# Patient Record
Sex: Female | Born: 1955 | Race: White | Hispanic: No | Smoking: Former smoker
Health system: Southern US, Community
[De-identification: ages and names within clinical notes are randomized; demographics above are authoritative.]

## PROBLEM LIST (undated history)

## (undated) DIAGNOSIS — T7840XA Allergy, unspecified, initial encounter: Secondary | ICD-10-CM

## (undated) DIAGNOSIS — E559 Vitamin D deficiency, unspecified: Secondary | ICD-10-CM

## (undated) DIAGNOSIS — G4700 Insomnia, unspecified: Secondary | ICD-10-CM

## (undated) DIAGNOSIS — K219 Gastro-esophageal reflux disease without esophagitis: Secondary | ICD-10-CM

## (undated) DIAGNOSIS — G35 Multiple sclerosis: Secondary | ICD-10-CM

## (undated) DIAGNOSIS — E7800 Pure hypercholesterolemia, unspecified: Secondary | ICD-10-CM

## (undated) DIAGNOSIS — I509 Heart failure, unspecified: Secondary | ICD-10-CM

## (undated) DIAGNOSIS — K5792 Diverticulitis of intestine, part unspecified, without perforation or abscess without bleeding: Secondary | ICD-10-CM

## (undated) DIAGNOSIS — I4891 Unspecified atrial fibrillation: Secondary | ICD-10-CM

## (undated) DIAGNOSIS — I10 Essential (primary) hypertension: Secondary | ICD-10-CM

## (undated) HISTORY — DX: Essential (primary) hypertension: I10

## (undated) HISTORY — DX: Insomnia, unspecified: G47.00

## (undated) HISTORY — DX: Unspecified atrial fibrillation: I48.91

## (undated) HISTORY — DX: Vitamin D deficiency, unspecified: E55.9

## (undated) HISTORY — DX: Diverticulitis of intestine, part unspecified, without perforation or abscess without bleeding: K57.92

## (undated) HISTORY — DX: Gastro-esophageal reflux disease without esophagitis: K21.9

## (undated) HISTORY — DX: Allergy, unspecified, initial encounter: T78.40XA

## (undated) HISTORY — DX: Heart failure, unspecified: I50.9

## (undated) HISTORY — DX: Multiple sclerosis: G35

## (undated) HISTORY — PX: WISDOM TOOTH EXTRACTION: SHX21

## (undated) HISTORY — PX: TONSILLECTOMY: SUR1361

---

## 1979-01-22 HISTORY — PX: WISDOM TOOTH EXTRACTION: SHX21

## 2006-04-15 ENCOUNTER — Emergency Department (HOSPITAL_COMMUNITY): Admission: EM | Admit: 2006-04-15 | Discharge: 2006-04-15 | Payer: Self-pay | Admitting: Emergency Medicine

## 2008-09-22 ENCOUNTER — Ambulatory Visit (HOSPITAL_COMMUNITY): Admission: RE | Admit: 2008-09-22 | Discharge: 2008-09-22 | Payer: Self-pay | Admitting: Family Medicine

## 2009-07-11 ENCOUNTER — Emergency Department (HOSPITAL_COMMUNITY): Admission: EM | Admit: 2009-07-11 | Discharge: 2009-07-11 | Payer: Self-pay | Admitting: Emergency Medicine

## 2009-07-24 ENCOUNTER — Telehealth: Payer: Self-pay | Admitting: Gastroenterology

## 2009-08-11 ENCOUNTER — Ambulatory Visit: Payer: Self-pay | Admitting: Gastroenterology

## 2009-08-11 DIAGNOSIS — K5732 Diverticulitis of large intestine without perforation or abscess without bleeding: Secondary | ICD-10-CM | POA: Insufficient documentation

## 2009-08-11 DIAGNOSIS — K589 Irritable bowel syndrome without diarrhea: Secondary | ICD-10-CM

## 2009-08-21 ENCOUNTER — Telehealth: Payer: Self-pay | Admitting: Gastroenterology

## 2009-08-21 ENCOUNTER — Ambulatory Visit (HOSPITAL_COMMUNITY): Admission: RE | Admit: 2009-08-21 | Discharge: 2009-08-21 | Payer: Self-pay | Admitting: Gastroenterology

## 2009-08-21 ENCOUNTER — Ambulatory Visit: Payer: Self-pay | Admitting: Gastroenterology

## 2009-08-21 HISTORY — PX: COLONOSCOPY: SHX174

## 2009-08-28 ENCOUNTER — Encounter (INDEPENDENT_AMBULATORY_CARE_PROVIDER_SITE_OTHER): Payer: Self-pay

## 2009-09-24 ENCOUNTER — Telehealth (INDEPENDENT_AMBULATORY_CARE_PROVIDER_SITE_OTHER): Payer: Self-pay | Admitting: *Deleted

## 2009-10-01 ENCOUNTER — Encounter: Payer: Self-pay | Admitting: Gastroenterology

## 2009-10-01 ENCOUNTER — Encounter (INDEPENDENT_AMBULATORY_CARE_PROVIDER_SITE_OTHER): Payer: Self-pay | Admitting: *Deleted

## 2009-10-13 ENCOUNTER — Encounter: Payer: Self-pay | Admitting: Gastroenterology

## 2009-10-22 ENCOUNTER — Emergency Department (HOSPITAL_COMMUNITY): Admission: EM | Admit: 2009-10-22 | Discharge: 2009-10-22 | Payer: Self-pay | Admitting: Emergency Medicine

## 2010-01-20 ENCOUNTER — Emergency Department (HOSPITAL_COMMUNITY): Admission: EM | Admit: 2010-01-20 | Discharge: 2010-01-21 | Payer: Self-pay | Admitting: Emergency Medicine

## 2010-04-12 ENCOUNTER — Ambulatory Visit (HOSPITAL_COMMUNITY): Admission: RE | Admit: 2010-04-12 | Discharge: 2010-04-12 | Payer: Self-pay | Admitting: Family Medicine

## 2010-06-13 ENCOUNTER — Encounter: Payer: Self-pay | Admitting: Family Medicine

## 2010-06-14 ENCOUNTER — Encounter: Payer: Self-pay | Admitting: Family Medicine

## 2010-06-22 NOTE — Assessment & Plan Note (Signed)
Summary: DIVERTICULITIS,COLON CANCER SCREENING, IBS   Visit Type:  Initial Consult Primary Care Provider:  Phillips Odor, M.D.  Chief Complaint:  hx of diverticulitis and consult for tcs.  History of Present Illness: Pt had first episode of diverticulitis and Rx: ABx-CIP/FLAG x 10 days. It helped. Presented with just pain. "Always had a weird stomach. Tons of IBS-sister, brother, father. Rectal urgency plus explosive diarrhea. usu. triggered by gteasy or spicy foods. No blood in her stool, weight loss, nausea, vomiting, indigestion, problems swallowing, or black tarry stool. Glass of Chardonnay may cause heartburn. Added Citrucel and made her have loose BMs for 1.5 days. BM: once daily, usu. formed/soft. Appetite never really lost it. No TCS ever.   Preventive Screening-Counseling & Management  Alcohol-Tobacco     Smoking Status: quit  Current Medications (verified): 1)  Avanex Injections .... Q Week 2)  Zoloft 50 Mg Tabs (Sertraline Hcl) .[name].. 11/2 Once Daily 3)  Benicar Hct 40-12.5 Mg Tabs (Olmesartan Medoxomil-Hctz) .... Once Daily 4)  Ambien 10 Mg Tabs (Zolpidem Tartrate) .... 1/2 At Bedtime 5)  Ibuprofen 800 Mg Tabs (Ibuprofen) .... As Needed 6)  Otc Allergy .... As Needed  Allergies (verified): 1)  ! Sulfa 2)  ! Codeine 3)  ! Penicillin  Past History:  Past Medical History: Multiple Sclerosis-pain Allergies Depression/Anxiety Hypertension Irritable Bowel Syndrome, DIARRHEA PREDOMINANT FEB 2011: SIGMOID COLON DIVERTICULITIS  Past Surgical History: Wisdom Teeth extracted 1980s  Family History: No FH of Colon Cancer  Polyps: father Family History of Breast Cancer: aunt No Family History of Ovarian Cancer: No Family History of Pancreatic Cancer: No Family History of Stomach Cancer: No Family History of Uterine Cancer:  Social History: Was in Advertising account executive for a newspaper. At [employer]. Divorced/single-no kids Patient is a former smoker. Quit  2005. Alcohol Use - yes, rare Smoking Status:  quit  Review of Systems       Per HPI, otherwise all systems negative AUG 2010: 158 LBS, TSH 3.198, HB 13.8 PLT 253 CR 0.78 AST 17 ALT 15 ALB 4.0 FEB 2011: WBC 14.4 o/w WNL, HFP: WNLs CT A/P w/ ivc-THICKENED SIGMOID COLON, DIVERTICULA IN DC/[place]  Vital Signs:  Patient profile:   55 year old female Height:      65 inches Weight:      164 pounds BMI:     27.39 Temp:     98.0 degrees F oral Pulse rate:   68 / minute BP sitting:   124 / 80  (left arm) Cuff size:   regular  Vitals Entered By: Hendricks Limes LPN (August 11, 2009 8:43 AM)  Physical Exam  General:  Well developed, well nourished, no acute distress. Head:  Normocephalic and atraumatic. Eyes:  PERRLA, no icterus. Mouth:  No deformity or lesions, dentition normal. Neck:  Supple; no masses. Lungs:  Clear throughout to auscultation. Heart:  Regular rate and rhythm; no murmurs, rubs,  or bruits. Abdomen:  Soft, nontender and nondistended. Normal bowel sounds. Neurologic:  Alert and  oriented x4;  grossly normal neurologically.  Impression & Recommendations:  Problem # 1:  DIVERTICULITIS, COLON (ICD-562.11) Assessment Improved  Sx resolved.   Orders: Consultation Level III (72536)  Problem # 2:  SCREENING, COLON CANCER (ICD-V76.51) Assessment: New Pt is average risk for developing colon CA. Never had a TCS. TCS APR 1. Pt requests Osmoprep. Explained <1% chance of renal failure. Pt encouraged to remain hydrated as indicated by urine that is light yellow. WILL CHECK CELIAC SEROLOGIES ON DAY OF TCS.  OPV as needed.  Problem # 3:  IRRITABLE BOWEL SYNDROME (ICD-564.1) Assessment: Unchanged WILL NEED CELIAC SEROLOGIES ON DAY OF TCS.  CC: PCP

## 2010-06-22 NOTE — Letter (Signed)
Summary: TCS ORDER  TCS ORDER   Imported By: Ave Filter 08/11/2009 09:30:23  _____________________________________________________________________  External Attachment:    Type:   Image     Comment:   External Document

## 2010-06-22 NOTE — Progress Notes (Signed)
Summary: RECTAL PAIN AFTER PREP  Pt c/o sore anus after prep. West Bali MD  August 21, 2009 12:20 PM      New/Updated Medications: PROCTOCREAM-HC 2.5 % CREA (HYDROCORTISONE) top qid for 5 days Prescriptions: PROCTOCREAM-HC 2.5 % CREA (HYDROCORTISONE) top qid for 5 days  #1 x 0   Entered and Authorized by:   West Bali MD   Signed by:   West Bali MD on 08/21/2009   Method used:   Electronically to        [facility] [address] [phone]* (retail)       [address], [place]  [address]       Ph: [phone]       Fax: [phone]   RxID:   [id number]

## 2010-06-22 NOTE — Progress Notes (Signed)
Summary: DIVERTICULITIS  Pt scheduled as a NPV due to work conflict. West Bali MD  July 24, 2009 1:48 PM

## 2010-06-22 NOTE — Letter (Signed)
Summary: Internal Other  Internal Other   Imported By: Peggyann Shoals 10/13/2009 13:58:55  _____________________________________________________________________  External Attachment:    Type:   Image     Comment:   External Document

## 2010-06-22 NOTE — Letter (Signed)
Summary: Normal Results Letter  [facility]  [address]   [place], [place] [address]   Phone: [phone]  Fax: [phone]    August 28, 2009  Krystal Silva [address] [place], [place]  [address] 10-Feb-1956   Dear [name],   Our office has been trying to contact you.  We just wanted to inform you that  your tests were normal, no celiac sprue. If you have any questions, please call the office at [phone].   Thank you,    Hendricks Limes, LPN Cloria Spring, LPN  [facility] Ph: [phone]   Fax: [phone]

## 2010-06-22 NOTE — Letter (Signed)
Summary: letter to resubmit screening TCS/MM  letter to resubmit screening TCS/MM   Imported By: Minna Merritts 10/01/2009 16:59:55  _____________________________________________________________________  External Attachment:    Type:   Image     Comment:   External Document

## 2010-06-22 NOTE — Progress Notes (Signed)
Summary: billing Q on TCS  LMOVM for pt to return my call.  Investigation shows multiple DXs entered on the office visit and the op rpt that do suggest screening.  Can speak with me when available.   ---- Converted from flag ---- ---- 09/24/2009 9:12 AM, Ave Filter wrote: Pt. came by and wanted to speak with you about a billing question..Her tcs was coded wrong it shoud have been coded as a screening.  She would like for you to give her a call. ------------------------------  Appended Document: billing Q on TCS Spoke with patient.  She is very upset that DX codes sent to insurance company do not reflect screening only reasons for proceeding with the test.  She states was extremely pain and used some explicatives to express how she would not have proceeded if she had known this.  I explained to her the protocol for how DX codes are determined by [facility] Billing and that is how it gets submitted to the insurance company.  She expressed that she was "dealing with [place]" and that we would not comment further on the situation and disconnected.

## 2010-06-22 NOTE — Letter (Signed)
Summary: Generic Letter, Intro to Referring  [facility]  [address]   [place], [place] [address]   Phone: [phone]  Fax: [phone]      Oct 01, 2009             RE: Krystal Silva   [birth year]                 [address]                 [place], [place]  [address]  Dear [name],  I am writing to you in regards to the recent letter you sent to our office to reconsider the claim billed to your insurance company for the procedure perfored on August 21, 2009.  I realize that Dr. Phillips Odor may have referred you to [place] for a colonoscopy based on your age and colon cancer screening guidelines.  However, when you visited the office, you had had an episode of diverticulitis with colonic wall thickening seen on your CT scan performed on July 11, 2009; and therefore, you did not meet the criteria for a screening colonoscopy.  Once evaluated in the office on August 11, 2009 by Jonette Eva, MD., the history of diverticulitis was discussed, as well as, the symptoms of rectal urgency and diarrhea related to irritable bowel syndrome.  Although Dr. Phillips Odor felt that you were due for a screening colonoscopy, Dr. Darrick Penna determined a colonoscopy was necessary to evaluate further possible explanations for the findings of the CT scan.  These diagnosises are what the billing department uses to submit a claim to your insurance company.  If we were to remove the symptoms from the office and operative report, we would be committing insurance fraud.  Thank you for allowing to participate in the care of your gastrointestinal needs.   Best regards,    Written onbehalf of Dr. Duncan Dull L. Fields  Minna Merritts, [facility] Ph: [phone]   Fax: [phone]  CC: Dr. Assunta Found

## 2010-08-06 LAB — DIFFERENTIAL
Basophils Absolute: 0 10*3/uL (ref 0.0–0.1)
Basophils Relative: 0 % (ref 0–1)
Monocytes Absolute: 0.9 10*3/uL (ref 0.1–1.0)
Neutro Abs: 8.6 10*3/uL — ABNORMAL HIGH (ref 1.7–7.7)
Neutrophils Relative %: 71 % (ref 43–77)

## 2010-08-06 LAB — URINALYSIS, ROUTINE W REFLEX MICROSCOPIC
Bilirubin Urine: NEGATIVE
Nitrite: NEGATIVE
Protein, ur: NEGATIVE mg/dL
Specific Gravity, Urine: 1.025 (ref 1.005–1.030)
Urobilinogen, UA: 0.2 mg/dL (ref 0.0–1.0)

## 2010-08-06 LAB — COMPREHENSIVE METABOLIC PANEL
Alkaline Phosphatase: 61 U/L (ref 39–117)
BUN: 11 mg/dL (ref 6–23)
Chloride: 102 mEq/L (ref 96–112)
Glucose, Bld: 106 mg/dL — ABNORMAL HIGH (ref 70–99)
Potassium: 3 mEq/L — ABNORMAL LOW (ref 3.5–5.1)
Total Bilirubin: 0.8 mg/dL (ref 0.3–1.2)

## 2010-08-06 LAB — CBC
HCT: 37.7 % (ref 36.0–46.0)
MCV: 89.9 fL (ref 78.0–100.0)
RBC: 4.19 MIL/uL (ref 3.87–5.11)
WBC: 12.1 10*3/uL — ABNORMAL HIGH (ref 4.0–10.5)

## 2010-08-09 LAB — URINE MICROSCOPIC-ADD ON

## 2010-08-09 LAB — URINALYSIS, ROUTINE W REFLEX MICROSCOPIC
Bilirubin Urine: NEGATIVE
Ketones, ur: NEGATIVE mg/dL
Protein, ur: NEGATIVE mg/dL
Urobilinogen, UA: 0.2 mg/dL (ref 0.0–1.0)

## 2010-08-11 LAB — PREGNANCY, URINE: Preg Test, Ur: NEGATIVE

## 2010-08-11 LAB — CBC
HCT: 36.7 % (ref 36.0–46.0)
Hemoglobin: 12.9 g/dL (ref 12.0–15.0)
MCV: 90.3 fL (ref 78.0–100.0)
RBC: 4.06 MIL/uL (ref 3.87–5.11)
WBC: 14.4 10*3/uL — ABNORMAL HIGH (ref 4.0–10.5)

## 2010-08-11 LAB — COMPREHENSIVE METABOLIC PANEL
Alkaline Phosphatase: 62 U/L (ref 39–117)
BUN: 10 mg/dL (ref 6–23)
CO2: 29 mEq/L (ref 19–32)
Chloride: 105 mEq/L (ref 96–112)
Creatinine, Ser: 0.85 mg/dL (ref 0.4–1.2)
GFR calc non Af Amer: >60 mL/min (ref >60)
Glucose, Bld: 109 mg/dL — ABNORMAL HIGH (ref 70–99)
Potassium: 3.3 mEq/L — ABNORMAL LOW (ref 3.5–5.1)
Total Bilirubin: 0.8 mg/dL (ref 0.3–1.2)

## 2010-08-11 LAB — URINALYSIS, ROUTINE W REFLEX MICROSCOPIC
Bilirubin Urine: NEGATIVE
Hgb urine dipstick: NEGATIVE
Nitrite: NEGATIVE
Protein, ur: NEGATIVE mg/dL
Specific Gravity, Urine: 1.02 (ref 1.005–1.030)
Urobilinogen, UA: 0.2 mg/dL (ref 0.0–1.0)

## 2010-08-11 LAB — DIFFERENTIAL
Basophils Absolute: 0.1 10*3/uL (ref 0.0–0.1)
Basophils Relative: 0 % (ref 0–1)
Lymphocytes Relative: 14 % (ref 12–46)
Neutro Abs: 11.3 10*3/uL — ABNORMAL HIGH (ref 1.7–7.7)

## 2011-03-22 DIAGNOSIS — G35 Multiple sclerosis: Secondary | ICD-10-CM | POA: Insufficient documentation

## 2011-04-08 ENCOUNTER — Other Ambulatory Visit: Payer: Self-pay | Admitting: Family Medicine

## 2011-04-08 DIAGNOSIS — Z139 Encounter for screening, unspecified: Secondary | ICD-10-CM

## 2011-04-19 ENCOUNTER — Ambulatory Visit (HOSPITAL_COMMUNITY)
Admission: RE | Admit: 2011-04-19 | Discharge: 2011-04-19 | Disposition: A | Discharge status: Home / Self Care | Payer: BC Managed Care – PPO | Source: Ambulatory Visit | Attending: Family Medicine | Admitting: Family Medicine

## 2011-04-19 DIAGNOSIS — Z139 Encounter for screening, unspecified: Secondary | ICD-10-CM

## 2011-04-19 DIAGNOSIS — Z1231 Encounter for screening mammogram for malignant neoplasm of breast: Secondary | ICD-10-CM | POA: Insufficient documentation

## 2012-04-30 ENCOUNTER — Other Ambulatory Visit: Payer: Self-pay | Admitting: Family Medicine

## 2012-04-30 DIAGNOSIS — Z139 Encounter for screening, unspecified: Secondary | ICD-10-CM

## 2012-05-03 ENCOUNTER — Ambulatory Visit (HOSPITAL_COMMUNITY): Payer: BC Managed Care – PPO

## 2012-05-04 ENCOUNTER — Ambulatory Visit (HOSPITAL_COMMUNITY)
Admission: RE | Admit: 2012-05-04 | Discharge: 2012-05-04 | Disposition: A | Discharge status: Home / Self Care | Payer: BC Managed Care – PPO | Source: Ambulatory Visit | Attending: Family Medicine | Admitting: Family Medicine

## 2012-05-04 DIAGNOSIS — Z1231 Encounter for screening mammogram for malignant neoplasm of breast: Secondary | ICD-10-CM | POA: Insufficient documentation

## 2012-05-04 DIAGNOSIS — Z139 Encounter for screening, unspecified: Secondary | ICD-10-CM

## 2012-08-29 ENCOUNTER — Other Ambulatory Visit: Payer: Self-pay | Admitting: Family Medicine

## 2012-10-18 ENCOUNTER — Telehealth: Payer: Self-pay | Admitting: Nurse Practitioner

## 2012-10-18 NOTE — Telephone Encounter (Signed)
Received her recent labs from [facility].  Has anyone talked to her about lipids or potassium?  Is anyone treating these issues?

## 2012-10-19 NOTE — Telephone Encounter (Signed)
Pt states she was told her potassium was low but no one is treating the issues with the potassium or lipids.

## 2012-10-22 NOTE — Telephone Encounter (Signed)
Will send in order for Potassium supplement.  Take daily and recheck potassium in 4 weeks. I recommend a low fat, low cholesterol diet. Her numbers were worse than last time.  Triglycerides remain over 300.  Strongly recommend starting a medication.  If she agrees, I will send in Rx for potassium and cholesterol med.

## 2012-11-01 ENCOUNTER — Encounter: Payer: Self-pay | Admitting: Nurse Practitioner

## 2012-11-01 ENCOUNTER — Ambulatory Visit (INDEPENDENT_AMBULATORY_CARE_PROVIDER_SITE_OTHER): Payer: BC Managed Care – PPO | Admitting: Nurse Practitioner

## 2012-11-01 VITALS — BP 132/90 | HR 80 | Wt 172.0 lb

## 2012-11-01 DIAGNOSIS — Z79899 Other long term (current) drug therapy: Secondary | ICD-10-CM

## 2012-11-01 DIAGNOSIS — E781 Pure hyperglyceridemia: Secondary | ICD-10-CM

## 2012-11-01 DIAGNOSIS — F418 Other specified anxiety disorders: Secondary | ICD-10-CM

## 2012-11-01 DIAGNOSIS — M62838 Other muscle spasm: Secondary | ICD-10-CM

## 2012-11-01 DIAGNOSIS — M21612 Bunion of left foot: Secondary | ICD-10-CM

## 2012-11-01 DIAGNOSIS — F341 Dysthymic disorder: Secondary | ICD-10-CM

## 2012-11-01 DIAGNOSIS — M21619 Bunion of unspecified foot: Secondary | ICD-10-CM

## 2012-11-01 DIAGNOSIS — E876 Hypokalemia: Secondary | ICD-10-CM

## 2012-11-01 MED ORDER — SERTRALINE HCL 100 MG PO TABS: 100.0000 mg | ORAL_TABLET | Freq: Every day | ORAL | Status: DC

## 2012-11-01 MED ORDER — PRAVASTATIN SODIUM 10 MG PO TABS: 10.0000 mg | ORAL_TABLET | Freq: Every day | ORAL | Status: DC

## 2012-11-01 MED ORDER — POTASSIUM CHLORIDE ER 10 MEQ PO TBCR: 10.0000 meq | EXTENDED_RELEASE_TABLET | Freq: Every day | ORAL | Status: DC

## 2012-11-01 NOTE — Patient Instructions (Signed)
Icy Hot TENS unit patches [email or web address]

## 2012-11-02 ENCOUNTER — Encounter: Payer: Self-pay | Admitting: Nurse Practitioner

## 2012-11-02 DIAGNOSIS — E781 Pure hyperglyceridemia: Secondary | ICD-10-CM | POA: Insufficient documentation

## 2012-11-02 DIAGNOSIS — E876 Hypokalemia: Secondary | ICD-10-CM | POA: Insufficient documentation

## 2012-11-02 DIAGNOSIS — F418 Other specified anxiety disorders: Secondary | ICD-10-CM | POA: Insufficient documentation

## 2012-11-02 NOTE — Progress Notes (Signed)
Subjective:  Patient presents for a multitude of issues. According to her recent lab work, her potassium was low at 3.0. Patient has been on Hyzaar long term. Is not currently on a potassium supplement. Has a family history of lipid problems. Overall eats a healthy diet. Limited activity lately due to extreme stress related to her job. Feels that this will improve over time. In the meantime patient has had extreme fatigue trouble sleeping emotional lability. Has been on Zoloft 75 mg for a long time. Also complaints of tight tender muscles along the upper back neck area again related to stress. Localized to this area. Occasional dull headache. Also complaints of chronic bunion on the left foot. Patient is interested in using OTC natural supplements.  Objective:   BP 132/90  Pulse 80  Wt 172 lb (78.019 kg)  BMI 28.62 kg/m2 NAD. Alert, oriented. Lungs clear. Heart regular rate rhythm. Significant bunion noted on the left foot, mildly tender to palpation. Extremely tight tender muscles noted along the upper back/neck area particularly along the trapezius and cervical area. Muscle strength 5+ bilateral upper extremities.    Assessment:Hypertriglyceridemia - Plan: Lipid panel, Lipid panel  High risk medication use - Plan: Hepatic function panel, Hepatic function panel  Hypokalemia  Depression with anxiety  Muscle spasms of head and/or neck  Bunion of left foot  Plan: Recent labs reviewed with patient, lengthy discussion. Discussed risks associated with uncontrolled lipids. Given prescription for a TENS unit. Increase Zoloft 100 mg daily. Start pravastatin 10 mg daily. K-Dur 10 milliequivalents daily. Repeat lab work in 8-10 weeks. Recheck here in 3 months, call back sooner if any problems. Recommend massage therapy. Ice/heat applications to the neck area. Stretching exercises. Patient understands that natural supplements or not regulated by FDA, given informational websites to look for safety  information. Recommend office visit with local podiatrist for her bunion.

## 2012-11-02 NOTE — Assessment & Plan Note (Signed)
Plan: Recent labs reviewed with patient, lengthy discussion. Discussed risks associated with uncontrolled lipids. Given prescription for a TENS unit. Increase Zoloft 100 mg daily. Start pravastatin 10 mg daily. K-Dur 10 milliequivalents daily. Repeat lab work in 8-10 weeks. Recheck here in 3 months, call back sooner if any problems.  

## 2012-11-02 NOTE — Assessment & Plan Note (Signed)
Plan: Recent labs reviewed with patient, lengthy discussion. Discussed risks associated with uncontrolled lipids. Given prescription for a TENS unit. Increase Zoloft 100 mg daily. Start pravastatin 10 mg daily. K-Dur 10 milliequivalents daily. Repeat lab work in 8-10 weeks. Recheck here in 3 months, call back sooner if any problems.

## 2012-11-09 ENCOUNTER — Telehealth: Payer: Self-pay | Admitting: Nurse Practitioner

## 2012-11-09 NOTE — Telephone Encounter (Signed)
Patient states that pravastatin has been making her feel very fatigue, achy and have headaches. What else do you recommend?

## 2012-11-09 NOTE — Telephone Encounter (Signed)
Pt states she is still having issue with her pravastatin (PRAVACHOL) 10 MG tablet, can someone please return a call to her to discuss this please

## 2012-11-12 ENCOUNTER — Other Ambulatory Visit: Payer: Self-pay | Admitting: *Deleted

## 2012-11-12 ENCOUNTER — Telehealth: Payer: Self-pay | Admitting: Nurse Practitioner

## 2012-11-12 MED ORDER — ATORVASTATIN CALCIUM 10 MG PO TABS: 10.0000 mg | ORAL_TABLET | Freq: Every day | ORAL | Status: DC

## 2012-11-12 NOTE — Telephone Encounter (Signed)
Pt to wait one week then take Lipitor 10mg 

## 2012-11-12 NOTE — Telephone Encounter (Signed)
Patient wants to try Zocor instead of Lipitor because she is hearing bad things about Lipitor   [facility]

## 2012-11-12 NOTE — Telephone Encounter (Signed)
Wait one week then switch to Lipitor (generic) 10 mg qd (with 2 refills).  Call back if symptoms recurs. Still get blood work as planned.

## 2012-11-14 ENCOUNTER — Other Ambulatory Visit: Payer: Self-pay | Admitting: *Deleted

## 2012-11-14 ENCOUNTER — Telehealth: Payer: Self-pay | Admitting: *Deleted

## 2012-11-14 MED ORDER — SIMVASTATIN 20 MG PO TABS: 20.0000 mg | ORAL_TABLET | Freq: Every evening | ORAL | Status: DC

## 2012-11-14 NOTE — Telephone Encounter (Signed)
Please switch to Zocor 20 mg each day (30 with 2 refills).  Labwork as previously ordered.

## 2012-11-14 NOTE — Telephone Encounter (Signed)
Left message of medication called into pharmacy

## 2012-11-15 ENCOUNTER — Telehealth: Payer: Self-pay | Admitting: *Deleted

## 2012-11-15 ENCOUNTER — Other Ambulatory Visit: Payer: Self-pay | Admitting: Nurse Practitioner

## 2012-11-15 MED ORDER — IBUPROFEN 800 MG PO TABS: 800.0000 mg | ORAL_TABLET | Freq: Three times a day (TID) | ORAL | Status: DC | PRN

## 2012-12-09 ENCOUNTER — Other Ambulatory Visit: Payer: Self-pay | Admitting: Family Medicine

## 2013-02-07 ENCOUNTER — Other Ambulatory Visit: Payer: Self-pay | Admitting: Nurse Practitioner

## 2013-02-09 ENCOUNTER — Other Ambulatory Visit: Payer: Self-pay | Admitting: Nurse Practitioner

## 2013-02-25 ENCOUNTER — Other Ambulatory Visit: Payer: Self-pay | Admitting: Nurse Practitioner

## 2013-03-04 ENCOUNTER — Encounter: Payer: Self-pay | Admitting: Nurse Practitioner

## 2013-03-04 ENCOUNTER — Ambulatory Visit (INDEPENDENT_AMBULATORY_CARE_PROVIDER_SITE_OTHER): Payer: BC Managed Care – PPO | Admitting: Nurse Practitioner

## 2013-03-04 VITALS — BP 126/84 | Temp 97.9°F | Ht 65.0 in | Wt 176.4 lb

## 2013-03-04 DIAGNOSIS — J069 Acute upper respiratory infection, unspecified: Secondary | ICD-10-CM

## 2013-03-04 MED ORDER — AZITHROMYCIN 250 MG PO TABS: ORAL_TABLET | ORAL | Status: DC

## 2013-03-04 NOTE — Patient Instructions (Signed)
Loratadine 10 mg in the morning Benadryl at night Nasacort AQ as directed

## 2013-03-06 ENCOUNTER — Encounter: Payer: Self-pay | Admitting: Nurse Practitioner

## 2013-03-06 DIAGNOSIS — G35 Multiple sclerosis: Secondary | ICD-10-CM | POA: Insufficient documentation

## 2013-03-06 NOTE — Progress Notes (Signed)
Subjective:  Presents complaints of sore throat ear pain congestion for the past 3 days. Low-grade fever. Slight cough. Head congestion. No wheezing. Ethmoid sinus area headache. Bilateral ear pain more so on the left side. No vomiting diarrhea or abdominal pain. Taking fluids well.  Objective:   BP 126/84  Temp(Src) 97.9 F (36.6 C) (Oral)  Ht 5\' 5"  (1.651 m)  Wt 176 lb 6.4 oz (80.015 kg)  BMI 29.35 kg/m2 NAD. Alert, oriented. TMs significant effusion, no erythema. Pharynx injected with greenish PND noted. Neck supple with mild soft nontender adenopathy. Lungs clear. Heart regular rate rhythm.  Assessment:Acute upper respiratory infections of unspecified site  Plan: Meds ordered this encounter  Medications  . azithromycin (ZITHROMAX Z-PAK) 250 MG tablet    Sig: Take 2 tablets (500 mg) on  Day 1,  followed by 1 tablet (250 mg) once daily on Days 2 through 5.    Dispense:  6 each    Refill:  0    Order Specific Question:  Supervising Provider    Answer:  Merlyn Albert [[id number]]   OTC meds as directed for congestion. Call back if symptoms worsen or persist.

## 2013-05-05 ENCOUNTER — Other Ambulatory Visit: Payer: Self-pay | Admitting: Family Medicine

## 2013-05-24 ENCOUNTER — Encounter: Payer: Self-pay | Admitting: Nurse Practitioner

## 2013-05-24 ENCOUNTER — Ambulatory Visit (INDEPENDENT_AMBULATORY_CARE_PROVIDER_SITE_OTHER): Payer: BC Managed Care – PPO | Admitting: Nurse Practitioner

## 2013-05-24 VITALS — BP 122/80 | Ht 64.0 in | Wt 180.0 lb

## 2013-05-24 DIAGNOSIS — E781 Pure hyperglyceridemia: Secondary | ICD-10-CM

## 2013-05-24 DIAGNOSIS — Z0189 Encounter for other specified special examinations: Secondary | ICD-10-CM

## 2013-05-24 DIAGNOSIS — Z1211 Encounter for screening for malignant neoplasm of colon: Secondary | ICD-10-CM

## 2013-05-24 DIAGNOSIS — E876 Hypokalemia: Secondary | ICD-10-CM

## 2013-05-24 DIAGNOSIS — Z01419 Encounter for gynecological examination (general) (routine) without abnormal findings: Secondary | ICD-10-CM

## 2013-05-24 DIAGNOSIS — I10 Essential (primary) hypertension: Secondary | ICD-10-CM

## 2013-05-24 DIAGNOSIS — Z0000 Encounter for general adult medical examination without abnormal findings: Secondary | ICD-10-CM

## 2013-05-24 NOTE — Patient Instructions (Addendum)
Glycemic index Mediterranean diet ([employer]) TOPS Weight Watchers Metformin

## 2013-05-27 ENCOUNTER — Encounter: Payer: Self-pay | Admitting: Nurse Practitioner

## 2013-05-27 DIAGNOSIS — I10 Essential (primary) hypertension: Secondary | ICD-10-CM | POA: Insufficient documentation

## 2013-05-27 NOTE — Progress Notes (Signed)
   Subjective:    Patient ID: Krystal Silva, female    DOB: Oct 04, 1955, 58 y.o.   MRN: [id number]  HPI Presents for wellness exam. Taking vitamin D supplement. No intercourse since last physical. No vaginal bleeding for 2-3 years. No pelvic pain. Regular vision exams. Regular dental exams. Gets routine labs done at work has copy today. Flu vaccine at work.    Review of Systems  Constitutional: Negative for fever, activity change, appetite change and fatigue.  HENT: Positive for hearing loss and rhinorrhea. Negative for dental problem, ear pain and sore throat.   Respiratory: Negative for cough, chest tightness, shortness of breath and wheezing.   Cardiovascular: Negative for chest pain and leg swelling.  Gastrointestinal: Negative for nausea, vomiting, abdominal pain, diarrhea, constipation and blood in stool.  Genitourinary: Negative for dysuria, urgency, frequency, vaginal bleeding, vaginal discharge, difficulty urinating, genital sores, menstrual problem and pelvic pain.       Objective:   Physical Exam  Vitals reviewed. Constitutional: She is oriented to person, place, and time. She appears well-developed. No distress.  HENT:  Right Ear: External ear normal.  Left Ear: External ear normal.  Mouth/Throat: Oropharynx is clear and moist.  Neck: Normal range of motion. Neck supple. No tracheal deviation present. No thyromegaly present.  Cardiovascular: Normal rate, regular rhythm and normal heart sounds.  Exam reveals no gallop.   No murmur heard. Pulmonary/Chest: Effort normal and breath sounds normal.  Abdominal: Soft. She exhibits no distension. There is no tenderness.  Genitourinary: Vagina normal and uterus normal. No vaginal discharge found.  Musculoskeletal: She exhibits no edema.  Lymphadenopathy:    She has no cervical adenopathy.  Neurological: She is alert and oriented to person, place, and time.  Skin: Skin is warm and dry. No rash noted.  Psychiatric: She has a normal  mood and affect. Her behavior is normal.  Breast exam: no masses; axillae no adenopathy. External GU: no lesions or rash. Vagina: no discharge.  Bimanual exam: normal but limited due to abd girth; no obvious masses. Rectal exam: normal no stool for hemoccult. CT scan lab work from her employer dated 04/30/2013       Assessment & Plan:  Well woman exam  Other specified examination - Plan: POC Hemoccult Bld/Stl (3-Cd Home Screen)  Hypokalemia  Hypertriglyceridemia  Essential hypertension, benign  Patient to have vitamin D level, potassium level and liver function tests done at work, given prescription for this. Will also get mammogram at work. To send copies of results to our office. Encouraged healthy diet regular activity and weight loss. Recheck in 6 months. Next physical in one year.

## 2013-06-05 ENCOUNTER — Other Ambulatory Visit: Payer: Self-pay | Admitting: Family Medicine

## 2013-06-05 DIAGNOSIS — Z139 Encounter for screening, unspecified: Secondary | ICD-10-CM

## 2013-06-08 ENCOUNTER — Other Ambulatory Visit: Payer: Self-pay | Admitting: Family Medicine

## 2013-06-10 ENCOUNTER — Ambulatory Visit (HOSPITAL_COMMUNITY)
Admission: RE | Admit: 2013-06-10 | Discharge: 2013-06-10 | Disposition: A | Discharge status: Home / Self Care | Payer: BC Managed Care – PPO | Source: Ambulatory Visit | Attending: Family Medicine | Admitting: Family Medicine

## 2013-06-10 DIAGNOSIS — Z1231 Encounter for screening mammogram for malignant neoplasm of breast: Secondary | ICD-10-CM | POA: Insufficient documentation

## 2013-06-10 DIAGNOSIS — Z139 Encounter for screening, unspecified: Secondary | ICD-10-CM

## 2013-06-14 ENCOUNTER — Other Ambulatory Visit: Payer: Self-pay | Admitting: *Deleted

## 2013-06-14 DIAGNOSIS — Z0189 Encounter for other specified special examinations: Secondary | ICD-10-CM

## 2013-06-14 LAB — POC HEMOCCULT BLD/STL (HOME/3-CARD/SCREEN)
Card #2 Fecal Occult Blod, POC: NEGATIVE
Card #3 Fecal Occult Blood, POC: NEGATIVE
Fecal Occult Blood, POC: NEGATIVE

## 2013-07-24 ENCOUNTER — Other Ambulatory Visit: Payer: Self-pay | Admitting: Neurology

## 2013-07-24 DIAGNOSIS — G35 Multiple sclerosis: Secondary | ICD-10-CM

## 2013-07-27 ENCOUNTER — Other Ambulatory Visit: Payer: Self-pay | Admitting: Family Medicine

## 2013-07-29 ENCOUNTER — Other Ambulatory Visit: Payer: Self-pay | Admitting: Family Medicine

## 2013-08-03 ENCOUNTER — Other Ambulatory Visit: Payer: Self-pay | Admitting: Family Medicine

## 2013-08-06 ENCOUNTER — Other Ambulatory Visit: Payer: BC Managed Care – PPO

## 2013-08-12 ENCOUNTER — Ambulatory Visit
Admission: RE | Admit: 2013-08-12 | Discharge: 2013-08-12 | Disposition: A | Discharge status: Home / Self Care | Payer: BC Managed Care – PPO | Source: Ambulatory Visit | Attending: Neurology | Admitting: Neurology

## 2013-08-12 DIAGNOSIS — G35 Multiple sclerosis: Secondary | ICD-10-CM

## 2013-08-12 MED ORDER — GADOBENATE DIMEGLUMINE 529 MG/ML IV SOLN
16.0000 mL | Freq: Once | INTRAVENOUS | Status: AC | PRN
  Administered 2013-08-12: 16 mL via INTRAVENOUS

## 2013-08-22 ENCOUNTER — Encounter: Payer: Self-pay | Admitting: Nurse Practitioner

## 2013-08-22 ENCOUNTER — Ambulatory Visit (INDEPENDENT_AMBULATORY_CARE_PROVIDER_SITE_OTHER): Payer: BC Managed Care – PPO | Admitting: Nurse Practitioner

## 2013-08-22 VITALS — BP 112/80 | Temp 98.8°F | Ht 64.0 in | Wt 184.2 lb

## 2013-08-22 DIAGNOSIS — J069 Acute upper respiratory infection, unspecified: Secondary | ICD-10-CM

## 2013-08-22 MED ORDER — AZITHROMYCIN 250 MG PO TABS: ORAL_TABLET | ORAL | Status: DC

## 2013-08-23 ENCOUNTER — Encounter: Payer: Self-pay | Admitting: Nurse Practitioner

## 2013-08-23 NOTE — Progress Notes (Signed)
Subjective:  Presents complaints of sore throat and congestion that began last night. No fever. No headache. Rare nonproductive cough. Slight runny nose. Mild achiness and fatigue. Bilateral ear pain. No wheezing. Taking fluids well. Voiding normal limit.  Objective:   BP 112/80  Temp(Src) 98.8 F (37.1 C) (Oral)  Ht 5\' 4"  (1.626 m)  Wt 184 lb 4 oz (83.575 kg)  BMI 31.61 kg/m2 NAD. Alert, oriented. TMs clear effusion, more on the left side, no erythema. Pharynx mildly injected with cloudy PND noted. Neck supple with mild soft anterior adenopathy. Lungs clear. Heart regular rate rhythm.  Assessment:Acute upper respiratory infections of unspecified site  Plan: Explained that this is most likely a viral illness. Warning signs reviewed. A prescription was sent in for Zithromax in case it is needed over the weekend. OTC meds as directed for congestion and cough. Call back next week if no improvement, sooner if worse.

## 2013-09-06 ENCOUNTER — Other Ambulatory Visit: Payer: Self-pay | Admitting: Family Medicine

## 2013-10-28 ENCOUNTER — Other Ambulatory Visit: Payer: Self-pay | Admitting: Family Medicine

## 2013-10-31 ENCOUNTER — Other Ambulatory Visit: Payer: Self-pay | Admitting: Family Medicine

## 2013-11-06 ENCOUNTER — Other Ambulatory Visit: Payer: Self-pay | Admitting: Family Medicine

## 2013-12-06 ENCOUNTER — Other Ambulatory Visit: Payer: Self-pay | Admitting: Family Medicine

## 2013-12-06 NOTE — Telephone Encounter (Signed)
2 refill, office visit this fall

## 2013-12-07 ENCOUNTER — Other Ambulatory Visit: Payer: Self-pay | Admitting: Family Medicine

## 2014-01-28 ENCOUNTER — Other Ambulatory Visit: Payer: Self-pay | Admitting: Family Medicine

## 2014-01-30 ENCOUNTER — Telehealth: Payer: Self-pay | Admitting: Nurse Practitioner

## 2014-01-30 NOTE — Telephone Encounter (Addendum)
Patient requesting we call in an antibiotic for her sore throat. Advised patient we do not call in antibiotics anymore unless seen recently for the same problem. Patient not seen since April.  Patient verbalized understanding.

## 2014-01-30 NOTE — Telephone Encounter (Signed)
Nurses please call for more info. We have not seen her since April. Thanks.

## 2014-01-30 NOTE — Telephone Encounter (Signed)
Pt is looking to talk with you bc she wants a zpak called in for her Sore throat, ear pain, cough   Advised we don't call in meds anymore, tried to help her [facility] help her be seen an not go to the ER  Urgent care an here don't fit her work hours for this week   [facility]

## 2014-02-24 ENCOUNTER — Ambulatory Visit: Payer: BC Managed Care – PPO | Admitting: Nurse Practitioner

## 2014-02-26 ENCOUNTER — Encounter: Payer: Self-pay | Admitting: Nurse Practitioner

## 2014-02-26 ENCOUNTER — Ambulatory Visit (INDEPENDENT_AMBULATORY_CARE_PROVIDER_SITE_OTHER): Payer: BC Managed Care – PPO | Admitting: Nurse Practitioner

## 2014-02-26 VITALS — BP 132/94 | Ht 64.0 in | Wt 190.0 lb

## 2014-02-26 DIAGNOSIS — E781 Pure hyperglyceridemia: Secondary | ICD-10-CM

## 2014-02-26 DIAGNOSIS — G35 Multiple sclerosis: Secondary | ICD-10-CM

## 2014-02-26 DIAGNOSIS — Z23 Encounter for immunization: Secondary | ICD-10-CM

## 2014-02-26 DIAGNOSIS — F418 Other specified anxiety disorders: Secondary | ICD-10-CM

## 2014-02-26 DIAGNOSIS — E876 Hypokalemia: Secondary | ICD-10-CM

## 2014-02-26 DIAGNOSIS — I10 Essential (primary) hypertension: Secondary | ICD-10-CM

## 2014-02-26 MED ORDER — LOSARTAN POTASSIUM-HCTZ 100-12.5 MG PO TABS: ORAL_TABLET | ORAL | Status: DC

## 2014-02-26 MED ORDER — SERTRALINE HCL 100 MG PO TABS: ORAL_TABLET | ORAL | Status: DC

## 2014-02-26 MED ORDER — POTASSIUM CHLORIDE CRYS ER 10 MEQ PO TBCR: EXTENDED_RELEASE_TABLET | ORAL | Status: DC

## 2014-02-26 MED ORDER — SIMVASTATIN 40 MG PO TABS: 40.0000 mg | ORAL_TABLET | Freq: Every day | ORAL | Status: DC

## 2014-03-02 ENCOUNTER — Encounter: Payer: Self-pay | Admitting: Nurse Practitioner

## 2014-03-02 NOTE — Progress Notes (Signed)
Subjective:  Presents for routine followup. Had her lab work done through [employer] where she is employed. Requesting referral to a different specialist for her MS. Has been seen at [facility] but is dissatisfied with her current care. No chest pain/ischemic type pain or shortness of breath. Has a chronic history of slight leakage at the end of urination.  Objective:   BP 132/94  Ht 5\' 4"  (1.626 m)  Wt 190 lb (86.183 kg)  BMI 32.60 kg/m2 NAD. Alert, oriented. Lungs clear. Heart regular rate rhythm. Lower extremities no edema. Lab work dated 01/22/14 shows triglycerides 332 and glucose 114 although there is some question about whether this was fasting. According to patient her last hemoglobin A1c was normal.  Assessment:  Problem List Items Addressed This Visit     Cardiovascular and Mediastinum   Essential hypertension, benign - Primary (Chronic)   Relevant Medications      losartan-hydrochlorothiazide (HYZAAR) 100-12.5 MG per tablet      simvastatin (ZOCOR) tablet     Nervous and Auditory   Multiple sclerosis (Chronic)     Other   Hypertriglyceridemia   Relevant Medications      losartan-hydrochlorothiazide (HYZAAR) 100-12.5 MG per tablet      simvastatin (ZOCOR) tablet   Hypokalemia   Depression with anxiety    Other Visit Diagnoses   Encounter for immunization          Increase Zocor to 40 mg daily. Repeat fasting sugar, lipid profile and liver profile in 8-10 weeks through her employer.To send or fax a copy of her labs. Encouraged healthy diet and regular activity. Refer to another specialist for evaluation of her MS. Discussed potential causes of urinary leakage including her MS. Defers urology workup at this point. Return in about 6 months (around 08/28/2014).

## 2014-03-09 ENCOUNTER — Other Ambulatory Visit: Payer: Self-pay | Admitting: Family Medicine

## 2014-03-30 ENCOUNTER — Other Ambulatory Visit: Payer: Self-pay | Admitting: Family Medicine

## 2014-04-08 ENCOUNTER — Ambulatory Visit: Payer: BC Managed Care – PPO | Admitting: Family Medicine

## 2014-08-07 ENCOUNTER — Ambulatory Visit (HOSPITAL_COMMUNITY)
Admission: RE | Admit: 2014-08-07 | Discharge: 2014-08-07 | Disposition: A | Discharge status: Home / Self Care | Payer: BLUE CROSS/BLUE SHIELD | Source: Ambulatory Visit | Attending: Radiology | Admitting: Radiology

## 2014-08-07 ENCOUNTER — Ambulatory Visit (INDEPENDENT_AMBULATORY_CARE_PROVIDER_SITE_OTHER): Payer: BLUE CROSS/BLUE SHIELD | Admitting: Family Medicine

## 2014-08-07 VITALS — BP 122/86 | Ht 64.0 in | Wt 191.4 lb

## 2014-08-07 DIAGNOSIS — W19XXXA Unspecified fall, initial encounter: Secondary | ICD-10-CM | POA: Diagnosis not present

## 2014-08-07 DIAGNOSIS — M25561 Pain in right knee: Secondary | ICD-10-CM

## 2014-08-07 NOTE — Progress Notes (Signed)
   Subjective:    Patient ID: Krystal Silva, female    DOB: [birth year], 57 y.o.   MRN: [id number]  HPI  Patient arrives with complaint of right knee pain since weekend. Patient states she twisted it a while back and it got better but started hurting over weekend again with no know injury.   no history of major prior injury to the knee until recently   Swelling some   Hurting off and on pain  Popped a lot   Picked up the box with the cat  Twisting sens in th4e kjneee  Got better for a week o rtwo   Walking intermittently  Review of Systems No hip pain length ankle pain no back pain    Objective:   Physical Exam  Alert no acute distress vital stable. Lungs clear. Heart regular in rhythm. Knee slight edema noted. Some potential effusion palpated posteriorly good range of motion. No joint line tenderness no joint laxity. Patient does point towards medial portion of knee      Assessment & Plan:  Impression knee strain/injury with potential meniscal involvement plan x-ray for now. Anti-inflammatory medicine recommended twice per day. Local measures discussed. If persists will need orthopedic referral [facility]

## 2014-08-12 ENCOUNTER — Other Ambulatory Visit: Payer: Self-pay | Admitting: Family Medicine

## 2014-08-12 DIAGNOSIS — Z1231 Encounter for screening mammogram for malignant neoplasm of breast: Secondary | ICD-10-CM

## 2014-08-17 ENCOUNTER — Other Ambulatory Visit: Payer: Self-pay | Admitting: Nurse Practitioner

## 2014-08-18 ENCOUNTER — Ambulatory Visit (HOSPITAL_COMMUNITY)
Admission: RE | Admit: 2014-08-18 | Discharge: 2014-08-18 | Disposition: A | Discharge status: Home / Self Care | Payer: BLUE CROSS/BLUE SHIELD | Source: Ambulatory Visit | Attending: Family Medicine | Admitting: Family Medicine

## 2014-08-18 DIAGNOSIS — Z1231 Encounter for screening mammogram for malignant neoplasm of breast: Secondary | ICD-10-CM | POA: Diagnosis not present

## 2014-08-21 ENCOUNTER — Encounter: Payer: Self-pay | Admitting: Nurse Practitioner

## 2014-08-21 ENCOUNTER — Ambulatory Visit (INDEPENDENT_AMBULATORY_CARE_PROVIDER_SITE_OTHER): Payer: BLUE CROSS/BLUE SHIELD | Admitting: Nurse Practitioner

## 2014-08-21 VITALS — BP 138/96 | HR 80 | Ht 64.5 in | Wt 190.0 lb

## 2014-08-21 DIAGNOSIS — Z01419 Encounter for gynecological examination (general) (routine) without abnormal findings: Secondary | ICD-10-CM

## 2014-08-21 DIAGNOSIS — Z0000 Encounter for general adult medical examination without abnormal findings: Secondary | ICD-10-CM | POA: Diagnosis not present

## 2014-08-21 DIAGNOSIS — E781 Pure hyperglyceridemia: Secondary | ICD-10-CM | POA: Diagnosis not present

## 2014-08-21 DIAGNOSIS — Z124 Encounter for screening for malignant neoplasm of cervix: Secondary | ICD-10-CM | POA: Diagnosis not present

## 2014-08-21 DIAGNOSIS — E876 Hypokalemia: Secondary | ICD-10-CM | POA: Diagnosis not present

## 2014-08-21 NOTE — Patient Instructions (Signed)
1 GM twice a day

## 2014-08-22 ENCOUNTER — Encounter: Payer: Self-pay | Admitting: Nurse Practitioner

## 2014-08-22 NOTE — Progress Notes (Signed)
   Subjective:    Patient ID: Krystal Silva, female    DOB: 18-Jul-1955, 59 y.o.   MRN: [id number]  HPI presents for her wellness exam. No vaginal bleeding. No pelvic pain. No sexual partner. Healthy diet. Active. Regular dental and vision care.     Review of Systems  Constitutional: Negative for activity change, appetite change and fatigue.  HENT: Negative for dental problem, ear pain, sinus pressure and sore throat.   Respiratory: Negative for cough, chest tightness, shortness of breath and wheezing.   Cardiovascular: Negative for chest pain.  Gastrointestinal: Negative for nausea, vomiting, abdominal pain, diarrhea, constipation and abdominal distention.  Genitourinary: Negative for dysuria, urgency, frequency, vaginal bleeding, vaginal discharge, enuresis, difficulty urinating, genital sores and pelvic pain.       Objective:   Physical Exam  Constitutional: She is oriented to person, place, and time. She appears well-developed. No distress.  HENT:  Right Ear: External ear normal.  Left Ear: External ear normal.  Mouth/Throat: Oropharynx is clear and moist.  Neck: Normal range of motion. Neck supple. No tracheal deviation present. No thyromegaly present.  Cardiovascular: Normal rate, regular rhythm and normal heart sounds.  Exam reveals no gallop.   No murmur heard. Pulmonary/Chest: Effort normal and breath sounds normal.  Abdominal: Soft. She exhibits no distension. There is no tenderness.  Genitourinary: Vagina normal and uterus normal. No vaginal discharge found.  External GU: pale and dry. Vagina: pale; no discharge. Cervix normal in appearance, no CMT. Bimanual exam: no tenderness or obvious masses. Rectal exam: no masses; no stool for hemoccult.   Musculoskeletal: She exhibits no edema.  Lymphadenopathy:    She has no cervical adenopathy.  Neurological: She is alert and oriented to person, place, and time.  Skin: Skin is warm and dry. No rash noted.  Psychiatric: She has a  normal mood and affect. Her behavior is normal.  Vitals reviewed. Breast exam: no masses; axillae no adenopathy.  Reviewed labs from work dated 08/13/14. TG 366. FBS 105. HgbA1C 5.7.      Assessment & Plan:   Problem List Items Addressed This Visit      Other   Hypertriglyceridemia   Hypokalemia    Other Visit Diagnoses    Well woman exam    -  Primary    Relevant Orders    POC Hemoccult Bld/Stl (3-Cd Home Screen)    Pap IG w/ reflex to HPV when ASC-U    Screening for cervical cancer        Relevant Orders    Pap IG w/ reflex to HPV when ASC-U      Taking Megared 500 mg per day. Slowly increase to 2 gm per day if tolerated. Repeat lipid in 6 months. If remains elevated, add second medication.  Recommend healthy diet and weight loss goal of 19 lbs.  Return in about 6 months (around 02/20/2015).

## 2014-08-23 ENCOUNTER — Encounter: Payer: Self-pay | Admitting: *Deleted

## 2014-08-26 LAB — PAP IG W/ RFLX HPV ASCU: PAP Smear Comment: 0

## 2014-09-01 ENCOUNTER — Other Ambulatory Visit: Payer: Self-pay | Admitting: *Deleted

## 2014-09-01 DIAGNOSIS — Z01419 Encounter for gynecological examination (general) (routine) without abnormal findings: Secondary | ICD-10-CM

## 2014-09-01 LAB — POC HEMOCCULT BLD/STL (HOME/3-CARD/SCREEN)
Card #2 Fecal Occult Blod, POC: NEGATIVE
Card #3 Fecal Occult Blood, POC: NEGATIVE
Fecal Occult Blood, POC: NEGATIVE

## 2014-09-05 ENCOUNTER — Telehealth: Payer: Self-pay | Admitting: *Deleted

## 2014-09-05 NOTE — Telephone Encounter (Signed)
[facility] see pt's bloodwork results in nurse's basket.

## 2014-09-08 ENCOUNTER — Ambulatory Visit (INDEPENDENT_AMBULATORY_CARE_PROVIDER_SITE_OTHER): Payer: BLUE CROSS/BLUE SHIELD | Admitting: Nurse Practitioner

## 2014-09-08 ENCOUNTER — Encounter: Payer: Self-pay | Admitting: Family Medicine

## 2014-09-08 ENCOUNTER — Encounter: Payer: Self-pay | Admitting: Nurse Practitioner

## 2014-09-08 VITALS — BP 122/80 | Temp 98.5°F | Ht 64.5 in | Wt 188.0 lb

## 2014-09-08 DIAGNOSIS — J111 Influenza due to unidentified influenza virus with other respiratory manifestations: Secondary | ICD-10-CM

## 2014-09-08 DIAGNOSIS — J069 Acute upper respiratory infection, unspecified: Secondary | ICD-10-CM

## 2014-09-08 DIAGNOSIS — B9689 Other specified bacterial agents as the cause of diseases classified elsewhere: Secondary | ICD-10-CM

## 2014-09-08 MED ORDER — AZITHROMYCIN 250 MG PO TABS: ORAL_TABLET | ORAL | Status: DC

## 2014-09-08 NOTE — Telephone Encounter (Signed)
Results discussed with patient. Patient verbalized understanding.

## 2014-09-10 ENCOUNTER — Encounter: Payer: Self-pay | Admitting: Nurse Practitioner

## 2014-09-10 ENCOUNTER — Encounter: Payer: Self-pay | Admitting: Family Medicine

## 2014-09-10 NOTE — Progress Notes (Signed)
Subjective:  Presents for c/o sore throat and congestion that began 3 days ago. Low grade fever last night. No headache. Fatigue. Ear pressure. Non productive cough. Slight chest tightness. No wheezing. Taking fluids well. Voiding nl.   Objective:   BP 122/80 mmHg  Temp(Src) 98.5 F (36.9 C) (Oral)  Ht 5' 4.5" (1.638 m)  Wt 188 lb (85.276 kg)  BMI 31.78 kg/m2 NAD. Alert, oriented. TMs clear effusion. Pharynx injected with PND noted. Neck supple with mild adenopathy. Lungs clear. Heart RRR.   Assessment: Influenza  Bacterial upper respiratory infection  Plan:  Meds ordered this encounter  Medications  . azithromycin (ZITHROMAX Z-PAK) 250 MG tablet    Sig: Take 2 tablets (500 mg) on  Day 1,  followed by 1 tablet (250 mg) once daily on Days 2 through 5.    Dispense:  6 each    Refill:  0    Order Specific Question:  Supervising Provider    Answer:  Mikey Kirschner [[id number]]   OTC as directed. Call back by end of week if no improvement, sooner if worse.

## 2014-09-13 ENCOUNTER — Other Ambulatory Visit: Payer: Self-pay | Admitting: Family Medicine

## 2014-10-12 ENCOUNTER — Other Ambulatory Visit: Payer: Self-pay | Admitting: Family Medicine

## 2014-10-15 ENCOUNTER — Encounter: Payer: Self-pay | Admitting: Neurology

## 2014-10-15 ENCOUNTER — Ambulatory Visit (INDEPENDENT_AMBULATORY_CARE_PROVIDER_SITE_OTHER): Payer: BLUE CROSS/BLUE SHIELD | Admitting: Neurology

## 2014-10-15 VITALS — BP 137/79 | HR 61 | Ht 64.5 in | Wt 188.6 lb

## 2014-10-15 DIAGNOSIS — R208 Other disturbances of skin sensation: Secondary | ICD-10-CM | POA: Insufficient documentation

## 2014-10-15 DIAGNOSIS — R269 Unspecified abnormalities of gait and mobility: Secondary | ICD-10-CM | POA: Diagnosis not present

## 2014-10-15 DIAGNOSIS — F418 Other specified anxiety disorders: Secondary | ICD-10-CM

## 2014-10-15 DIAGNOSIS — G35 Multiple sclerosis: Secondary | ICD-10-CM

## 2014-10-15 MED ORDER — IMIPRAMINE HCL 25 MG PO TABS: 25.0000 mg | ORAL_TABLET | Freq: Every day | ORAL | Status: DC

## 2014-10-15 NOTE — Progress Notes (Signed)
[facility]  PATIENT: Krystal Silva DOB: [birth year]  REFERRING DOCTOR OR PCP:   Coralie Keens SOURCE: patient, records from initial Neurology group and MRI images on PACS/CD  _________________________________   HISTORICAL  CHIEF COMPLAINT:  Chief Complaint  Patient presents with  . Advice Only    MS, front of BLE hurting with walking long distances also at night, numbness in the left foot     HISTORY OF PRESENT ILLNESS:  Krystal Silva is a 59 year old woman who was diagnosed with MS in 2006. In 2003, she had an episode of diplopia. An MRI was performed but she was not diagnosed with MS at that time. She was actually worked up for myasthenia gravis at that time but EMG was negative. 3 years later, she presented with dysesthetic sensations in the legs, mostly around the shin. She had MRIs performed that were more consistent with MS. She also had a lumbar puncture and the CSF was consistent with MS. The CSF showed oligoclonal bands and she also had an elevated IgG index of 1.37. At that time, she was seeing Lenna Sciara at [facility]. Around 2010 or 2011 she moved to [place]. She started to see Dr. George Hugh and then Dr. Shelia Media.    An MRI performed in 2011 was reportedly very similar to the one from 2006. She has been on Avonex therapy since diagnosis. She tolerated it poorly at first with depression and flulike symptoms after each shot. Currently she is tolerating it better but still gets flulike reactions with some of her shots. She has not had any exacerbations.  Gait/strength/sensation: She feels her gait is fairly good. 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 She denies any significant weakness in the arms or legs. She does have dysesthetic pain in both shins and she has a spot of numbness in the left foot. Dysesthetic pain is the same that was present at the onset.   Motrin has not helped the pain.  Gabapentin was tried but was poorly tolerated. The past, she was on a tricyclic for depression and has not been on one recently.  Bladder: She denies any significant problem with urinary function. Specifically, she does not have nocturia, frequency or hesitancy.  Vision: She denies any current MS related vision problem. She did have the double vision in 2003 without completely resolved. She has never had optic neuritis.  Fatigue/sleep: She denies any significant problem with physical or mental fatigue. She sleeps well almost every night.  Mood/cognition: She has had depression and anxiety that has been fairly well controlled with sertraline. In the past, she was on imipramine with good control as well. She denies any significant difficulty with cognition. She has not noted problems with her memory.  She does not have a family history of. Her mother did have polymyalgia rheumatica  Hearing: She wears hearing aids bilaterally.  She has a little bit of tinnitus   She has a family history of deafness.  REVIEW OF SYSTEMS: Constitutional: No fevers, chills, sweats, or change in appetite Eyes: No visual changes, double vision, eye pain Ear, nose and throat: No hearing loss, ear pain, nasal congestion, sore throat Cardiovascular: No chest pain, palpitations Respiratory: No shortness of breath at rest or with exertion.   No wheezes GastrointestinaI: No nausea, vomiting, diarrhea, abdominal pain, fecal incontinence Genitourinary: No dysuria, urinary retention or frequency.  No nocturia. Musculoskeletal: No neck pain, back pain Integumentary:  No rash, pruritus, skin lesions Neurological: as above Psychiatric: No depression at this time.  No anxiety Endocrine: No palpitations, diaphoresis, change in appetite, change in weigh or increased thirst Hematologic/Lymphatic: No anemia, purpura, petechiae. Allergic/Immunologic: No itchy/runny eyes, nasal congestion, recent allergic reactions,  rashes  ALLERGIES: Allergies  Allergen Reactions  . Codeine   . Penicillins     REACTION: rash  . Sulfonamide Derivatives   . Sulfamethoxazole Rash    HOME MEDICATIONS:  Current outpatient prescriptions:  .  AVONEX PEN 30 MCG/0.5ML AJKT, Inject 30 mcg as directed once a week., Disp: , Rfl: 10 .  baclofen (LIORESAL) 10 MG tablet, TAKE 1 TABLET BY MOUTH EVERY EVENING, Disp: , Rfl:  .  Cholecalciferol (VITAMIN D-3) 5000 UNITS TABS, Take by mouth daily., Disp: , Rfl:  .  ibuprofen (ADVIL,MOTRIN) 800 MG tablet, TAKE 1 TABLET BY MOUTH 3 TIMES A DAY AS NEEDED FOR PAIN, Disp: 90 tablet, Rfl: 1 .  interferon beta-1a (AVONEX) 30 MCG injection, Inject 30 mcg into the muscle., Disp: , Rfl:  .  Interferon Beta-1a 30 MCG/0.5ML AJKT, Inject 30 mcg into the muscle., Disp: , Rfl:  .  KLOR-CON M10 10 MEQ tablet, TAKE 1 TABLET BY MOUTH EVERY DAY, Disp: 30 tablet, Rfl: 5 .  LACTOBACILLUS RHAMNOSUS, GG, PO, Take 4 capsules by mouth 4 (four) times daily as needed., Disp: , Rfl:  .  losartan-hydrochlorothiazide (HYZAAR) 100-12.5 MG per tablet, TAKE 1 TABLET BY MOUTH EVERY DAY FOR BLOOD PRESSURE, Disp: 30 tablet, Rfl: 2 .  Multiple Vitamin (MULTIVITAMIN) tablet, Take 1 tablet by mouth daily., Disp: , Rfl:  .  potassium chloride (K-DUR) 10 MEQ tablet, Take 10 mEq by mouth., Disp: , Rfl:  .  RA KRILL OIL 500 MG CAPS, Take 1,000 mg by mouth., Disp: , Rfl:  .  sertraline (ZOLOFT) 100 MG tablet, TAKE 1 TABLET (100 MG TOTAL) BY MOUTH DAILY., Disp: 30 tablet, Rfl: 5 .  simvastatin (ZOCOR) 20 MG tablet, Take 20 mg by mouth., Disp: , Rfl:  .  vitamin E 400 UNIT capsule, Take 800 Units by mouth daily., Disp: , Rfl:  .  AVONEX PEN 30 MCG/0.5ML injection, , Disp: , Rfl:  .  azithromycin (ZITHROMAX Z-PAK) 250 MG tablet, Take 2 tablets (500 mg) on  Day 1,  followed by 1 tablet (250 mg) once daily on Days 2 through 5. (Patient not taking: Reported on 10/15/2014), Disp: 6 each, Rfl: 0 .  baclofen (LIORESAL) 10 MG tablet, ,  Disp: , Rfl:  .  baclofen (LIORESAL) 10 MG tablet, Take 10 mg by mouth at bedtime., Disp: , Rfl:  .  Cholecalciferol 5000 UNITS TABS, Take 5,000 Units by mouth., Disp: , Rfl:  .  ibuprofen (ADVIL,MOTRIN) 800 MG tablet, Take 800 mg by mouth., Disp: , Rfl:  .  losartan-hydrochlorothiazide (HYZAAR) 100-12.5 MG per tablet, Take 1 tablet by mouth., Disp: , Rfl:  .  Multiple Vitamins tablet, Take 1 tablet by mouth., Disp: , Rfl:  .  OVER THE COUNTER MEDICATION, Mega Red 2 a day, Disp: , Rfl:  .  potassium chloride (KLOR-CON M10) 10 MEQ tablet, TAKE 1 TABLET BY MOUTH EVERY DAY (Patient not taking: Reported on 10/15/2014), Disp: 30 tablet, Rfl: 5 .  Probiotic Product (PROBIOTIC DAILY PO), Take by mouth., Disp: , Rfl:  .  sertraline (ZOLOFT) 100 MG tablet, Take 100 mg by mouth., Disp: , Rfl:  .  simvastatin (ZOCOR) 40 MG tablet, TAKE 1 TABLET BY MOUTH AT BEDTIME (Patient not taking:  Reported on 10/15/2014), Disp: 30 tablet, Rfl: 2 .  vitamin E 1000 UNIT capsule, Take 400 Int'l Units by mouth., Disp: , Rfl:   PAST MEDICAL HISTORY: Past Medical History  Diagnosis Date  . Hypertension   . MS (multiple sclerosis)   . Diverticulitis   . Allergy   . Insomnia   . Vitamin D deficiency     PAST SURGICAL HISTORY: Past Surgical History  Procedure Laterality Date  . Tonsillectomy    . Wisdom tooth extraction  1980's  . Colonoscopy  08/21/2009    FAMILY HISTORY: Family History  Problem Relation Age of Onset  . Hypertension Mother   . Hyperlipidemia Father   . Hearing loss Father   . Hyperlipidemia Brother   . Diabetes Brother   . Vision loss Maternal Grandmother   . Vision loss Paternal Grandmother   . Cancer Other     breast    SOCIAL HISTORY:  History   Social History  . Marital Status: Divorced    Spouse Name: N/A  . Number of Children: N/A  . Years of Education: 42   Occupational History  . work     Social History Main Topics  . Smoking status: Former Smoker -- 1.50 packs/day  for 30 years    Types: Cigarettes    Start date: 12/26/1974    Quit date: 06/22/2004  . Smokeless tobacco: Never Used  . Alcohol Use: 0.6 oz/week    1 Cans of beer per week     Comment: rare social  . Drug Use: No  . Sexual Activity: Not Currently   Other Topics Concern  . Not on file   Social History Narrative   Coffee in the morning couple of cups    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     PHYSICAL EXAM  Filed Vitals:   10/15/14 1000  BP: 137/79  Pulse: 61  Height: 5' 4.5" (1.638 m)  Weight: 188 lb 9.6 oz (85.548 kg)    Body mass index is 31.88 kg/(m^2).   General: The patient is well-developed and well-nourished and in no acute distress  Eyes:  Funduscopic exam shows normal optic discs and retinal vessels.  Neck: The neck is supple, no carotid bruits are noted.  The neck is nontender.  Cardiovascular: The heart has a regular rate and rhythm with a normal S1 and S2. There were no murmurs, gallops or rubs. Lungs are clear to auscultation.  Skin: Extremities are without significant edema.  Musculoskeletal:  Back is nontender  Neurologic Exam  Mental status: The patient is alert and oriented x 3 at the time of the examination. The patient has apparent normal recent and remote memory, with an apparently normal attention span and concentration ability.   Speech is normal.  Cranial nerves: Extraocular movements are full. Pupils are equal, round, and reactive to light and accomodation.  Visual fields are full.  Facial symmetry is present. There is good facial sensation to soft touch bilaterally.Facial strength is normal.  Trapezius and sternocleidomastoid strength is normal. No dysarthria is noted.  The tongue is midline, and the patient has symmetric elevation of the soft palate. No obvious hearing deficits are noted.  Motor:  Muscle bulk is normal.   Tone is mildly increased in legs. Strength is  5 / 5 in all 4 extremities except 4+/5 in right EHL.   Sensory: Sensory testing  is intact to pinprick, soft touch and vibration sensation in all 4 extremities except the medial calcaneal division of  the left tibial nerve.  Coordination: Cerebellar testing reveals good finger-nose-finger and heel-to-shin bilaterally.  Gait and station: Station is normal.   Gait is minimally wide. Tandem gait is wide. Romberg is negative.   Reflexes: Deep tendon reflexes are symmetric and increased bilaterally with spread at knees and 2 beats nn-sustained clonus at ankles.   Plantar responses are flexor right and equivocal left.    DIAGNOSTIC DATA (LABS, IMAGING, TESTING) - I reviewed patient records, labs, notes, testing and imaging myself where available.  I reviewed her LFT from 07/22/2014 ---  It was normal.       ASSESSMENT AND PLAN  Multiple sclerosis  Depression with anxiety  Dysesthesia  Gait disturbance   In summary, Krystal Silva is a 7 old woman with multiple sclerosis.  She appears to be stable on Avonex therapy. I will try to get a copy of her 2011 MRI images so that I may compared to the one performed last year.   Her main problem is dysesthesias in the legs. She was unable to tolerate gabapentin. I will add imipramine to see if that can help with this symptom. If this is not helpful, I would consider adding lamotrigine.    She also has painless numbness in the left heel. 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 As it has not improved with a year, it is likely that the symptoms will persist. Luckily, she does not have any dysesthesias from it.   She will return to see me in 4 months or sooner if she has new or worsening neurologic symptoms.  Thank you very much for asking me to see [facility] for her multiple sclerosis.  Please let me know if I may be of any further assistance with her or other patients in the future.  Richard A. Felecia Shelling, MD, PhD 0/30/0923, 30:07 AM Certified in Neurology, Clinical Neurophysiology, Sleep Medicine, Pain  Medicine and Neuroimaging  [facility] [address] [place], [place] [address] [phone]

## 2014-10-16 ENCOUNTER — Telehealth: Payer: Self-pay | Admitting: *Deleted

## 2014-10-16 NOTE — Telephone Encounter (Signed)
Release faxed to [facility] requesting records on 10/16/14.

## 2014-12-18 ENCOUNTER — Other Ambulatory Visit: Payer: Self-pay | Admitting: Family Medicine

## 2015-01-13 ENCOUNTER — Encounter: Payer: Self-pay | Admitting: Orthopedic Surgery

## 2015-01-13 ENCOUNTER — Ambulatory Visit (INDEPENDENT_AMBULATORY_CARE_PROVIDER_SITE_OTHER): Payer: BLUE CROSS/BLUE SHIELD | Admitting: Orthopedic Surgery

## 2015-01-13 VITALS — BP 113/75 | Ht 64.5 in | Wt 189.0 lb

## 2015-01-13 DIAGNOSIS — S83241A Other tear of medial meniscus, current injury, right knee, initial encounter: Secondary | ICD-10-CM | POA: Diagnosis not present

## 2015-01-13 NOTE — Progress Notes (Signed)
Patient ID: Krystal Silva, female   DOB: July 21, 1955, 59 y.o.   MRN: [id number]   Chief Complaint  Patient presents with  . Follow-up    right knee pain and stiffness, referred by Jamelle Haring. Krystal Silva is a 59 y.o. female.   HPI The patient reports the following: She twisted her knee back in January 2016 she felt some pain and swelling in the back of the knee and on the medial side of the knee this progressed to increased stiffness with a dull aching pain. She tried Aleve and ibuprofen her pain has only slightly improved and she feels that her knee is just not right. She says after sitting for long period of time when she gets up her knee feels like it will give out  Review of systems hearing loss or ringing of the ears sinusitis hayfever seasonal allergies no chest pain shortness of breath GI discomfort or urinary complaints Review of Systems See hpi  Past Medical History  Diagnosis Date  . Hypertension   . MS (multiple sclerosis)   . Diverticulitis   . Allergy   . Insomnia   . Vitamin D deficiency     Allergies  Allergen Reactions  . Codeine   . Penicillins     REACTION: rash  . Sulfonamide Derivatives   . Sulfamethoxazole Rash    Current Outpatient Prescriptions  Medication Sig Dispense Refill  . AVONEX PEN 30 MCG/0.5ML injection     . baclofen (LIORESAL) 10 MG tablet TAKE 1 TABLET BY MOUTH EVERY EVENING    . Cholecalciferol (VITAMIN D-3) 5000 UNITS TABS Take by mouth daily.    [name] ibuprofen (ADVIL,MOTRIN) 800 MG tablet TAKE 1 TABLET BY MOUTH 3 TIMES A DAY AS NEEDED FOR PAIN 90 tablet 1  . imipramine (TOFRANIL) 25 MG tablet Take 1 tablet (25 mg total) by mouth at bedtime. 30 tablet 5  . KLOR-CON M10 10 MEQ tablet TAKE 1 TABLET BY MOUTH EVERY DAY 30 tablet 5  . LACTOBACILLUS RHAMNOSUS, GG, PO Take 4 capsules by mouth 4 (four) times daily as needed.    [name] losartan-hydrochlorothiazide (HYZAAR) 100-12.5 MG per tablet TAKE 1 TABLET BY MOUTH EVERY DAY FOR BLOOD PRESSURE 30  tablet 1  . Multiple Vitamin (MULTIVITAMIN) tablet Take 1 tablet by mouth daily.    . Multiple Vitamins tablet Take 1 tablet by mouth.    [name] OVER THE COUNTER MEDICATION Mega Red 2 a day    . potassium chloride (K-DUR) 10 MEQ tablet Take 10 mEq by mouth.    . Probiotic Product (PROBIOTIC DAILY PO) Take by mouth.    . RA KRILL OIL 500 MG CAPS Take 1,000 mg by mouth.    . sertraline (ZOLOFT) 100 MG tablet TAKE 1 TABLET (100 MG TOTAL) BY MOUTH DAILY. 30 tablet 5  . sertraline (ZOLOFT) 100 MG tablet Take 100 mg by mouth.    . simvastatin (ZOCOR) 20 MG tablet Take 20 mg by mouth.    . simvastatin (ZOCOR) 40 MG tablet TAKE 1 TABLET BY MOUTH AT BEDTIME 30 tablet 1  . vitamin E 1000 UNIT capsule Take 400 Int'l Units by mouth.    . vitamin E 400 UNIT capsule Take 800 Units by mouth daily.     No current facility-administered medications for this visit.     Physical Exam Blood pressure 113/75, height 5' 4.5" (1.638 m), weight 189 lb (85.73 kg). Physical Exam  The patient is well developed well nourished and well  groomed.   Orientation to person place and time is normal   Mood is pleasant.  Ambulatory status she is walking with no support and without a limp  Inspection: Medial joint line is tender lateral joint line is slightly tender  ROM: Flexion is normal  Stability: Normal  Motor exam: Normal  Skin: Normal  Neuro: Normal  Vascular: Normal  Lymph: Not tested  Left knee full range of motion stability alignment and strength    Right knee McMurray's positive   Data Reviewed  Read xrays  x-rays including report I independently interpret the x-ray as mild arthritis Reports reviewed: YES  Diagnosis New with further work up yes   Encounter Diagnosis  Name Primary?  . Medial meniscus tear, right, initial encounter Yes      Management MRI medial meniscal tear plan surgery

## 2015-01-13 NOTE — Patient Instructions (Signed)
We will schedule MRI for you and call you with appt (plz contact patient first due to work schedule)

## 2015-01-30 ENCOUNTER — Ambulatory Visit (HOSPITAL_COMMUNITY)
Admission: RE | Admit: 2015-01-30 | Discharge: 2015-01-30 | Disposition: A | Discharge status: Home / Self Care | Payer: BLUE CROSS/BLUE SHIELD | Source: Ambulatory Visit | Attending: Orthopedic Surgery | Admitting: Orthopedic Surgery

## 2015-01-30 DIAGNOSIS — M23211 Derangement of anterior horn of medial meniscus due to old tear or injury, right knee: Secondary | ICD-10-CM | POA: Diagnosis not present

## 2015-01-30 DIAGNOSIS — M23221 Derangement of posterior horn of medial meniscus due to old tear or injury, right knee: Secondary | ICD-10-CM | POA: Insufficient documentation

## 2015-01-30 DIAGNOSIS — M1711 Unilateral primary osteoarthritis, right knee: Secondary | ICD-10-CM | POA: Insufficient documentation

## 2015-01-30 DIAGNOSIS — S83241A Other tear of medial meniscus, current injury, right knee, initial encounter: Secondary | ICD-10-CM

## 2015-01-30 DIAGNOSIS — M23203 Derangement of unspecified medial meniscus due to old tear or injury, right knee: Secondary | ICD-10-CM | POA: Diagnosis present

## 2015-02-02 ENCOUNTER — Telehealth: Payer: Self-pay | Admitting: Orthopedic Surgery

## 2015-02-02 NOTE — Telephone Encounter (Signed)
MESSAGE LEFT TO CALL BACK   NEEDS SURGERY

## 2015-02-10 ENCOUNTER — Ambulatory Visit (INDEPENDENT_AMBULATORY_CARE_PROVIDER_SITE_OTHER): Payer: BLUE CROSS/BLUE SHIELD | Admitting: Orthopedic Surgery

## 2015-02-10 DIAGNOSIS — S83241D Other tear of medial meniscus, current injury, right knee, subsequent encounter: Secondary | ICD-10-CM

## 2015-02-10 NOTE — Patient Instructions (Signed)
Surgery [facility] W/ MM  CALL WHEN YOU ARE READY TO SCHEDULE

## 2015-02-10 NOTE — Progress Notes (Signed)
Patient ID: Krystal Silva, female   DOB: 09-15-1955, 59 y.o.   MRN: [id number]  Follow up visit  Chief Complaint  Patient presents with  . Follow-up    MRI results of right knee from [facility].    BP 133/81 mmHg  Ht 5\' 4"  (1.626 m)  Wt 189 lb (85.73 kg)  BMI 32.60 kg/m8  59 years old twisted her knee in January felt pain in the front and back of the knee and medial side progress to stiffness and a dull ache. She did not get relief with Aleve and ibuprofen. She does have some feeling that the knee may give way on her  Review of systems hearing loss ring of your sinusitis hayfever seasonal allergies denies chest pain shortness of breath does have a history of multiple sclerosis  MRI discuss with Ranita today. Torn medial meniscus. No symptoms prior to that despite fairly extensive arthritis on MRI. She would like to proceed with surgery her knee is just not doing well she can do anything she likes to do.   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

## 2015-02-12 ENCOUNTER — Other Ambulatory Visit: Payer: Self-pay | Admitting: Family Medicine

## 2015-02-18 ENCOUNTER — Ambulatory Visit (INDEPENDENT_AMBULATORY_CARE_PROVIDER_SITE_OTHER): Payer: BLUE CROSS/BLUE SHIELD | Admitting: Neurology

## 2015-02-18 ENCOUNTER — Ambulatory Visit: Payer: BLUE CROSS/BLUE SHIELD | Admitting: Nurse Practitioner

## 2015-02-18 ENCOUNTER — Encounter: Payer: Self-pay | Admitting: Neurology

## 2015-02-18 VITALS — BP 126/72 | HR 66 | Resp 16 | Ht 64.0 in | Wt 191.0 lb

## 2015-02-18 DIAGNOSIS — F418 Other specified anxiety disorders: Secondary | ICD-10-CM | POA: Diagnosis not present

## 2015-02-18 DIAGNOSIS — R269 Unspecified abnormalities of gait and mobility: Secondary | ICD-10-CM

## 2015-02-18 DIAGNOSIS — R208 Other disturbances of skin sensation: Secondary | ICD-10-CM | POA: Diagnosis not present

## 2015-02-18 DIAGNOSIS — G35 Multiple sclerosis: Secondary | ICD-10-CM

## 2015-02-18 MED ORDER — BACLOFEN 10 MG PO TABS: ORAL_TABLET | ORAL | Status: DC

## 2015-02-18 NOTE — Progress Notes (Signed)
[facility]  PATIENT: Krystal Silva DOB: [birth year]  REFERRING DOCTOR OR PCP:   Coralie Keens SOURCE: patient, records from initial Neurology group and MRI images on PACS/CD  _________________________________   HISTORICAL  CHIEF COMPLAINT:  Chief Complaint  Patient presents with  . Multiple Sclerosis    Sts. she continues to tolerate Avonex with only occasional flu-like sx. Sts. she was unable to tolerate Imipramine for dysesthesias--it made her foggy the next day.  Sts. she is going to have to have knee surgery and would like to discuss with Dr. Arlean Hopping    HISTORY OF PRESENT ILLNESS:  Krystal Silva is a 59 year old woman with MS.   She continues on Avonex. Currently she is tolerating it for the most part but still gets flulike reactions with some of her shots. She has not had any exacerbations.    Her last MRI was 07/2013 and showed no acute findings  MS History:  In 2003, she had an episode of diplopia. An MRI was performed but she was not diagnosed with MS at that time. In 2006, in [place],  she presented with dysesthetic sensations in the legs, mostly around the shin. She had MRI's performed that were more consistent with MS. She also had a lumbar puncture and the CSF was consistent with MS. The CSF showed oligoclonal bands and IgG index of 1.37 (elevated). She started to see Dr. George Hugh and then Dr. Shelia Media in 2010/2011 after moving to the area.    An MRI performed in 2011 was reportedly very similar to the one from 2006. She has been on Avonex therapy since diagnosis..  Gait/strength/sensation: Gait is fairly good but she has a mild limp due to her right knee (torn meniscus).  She denies any significant weakness in the arms or legs. She has dysesthetic pain in both shins.   She also has a spot of numbness in the left heel.  Gabapentin was tried but was poorly tolerated.  We tried imipramine but it was poorly tolerated.   Bladder: She denies any  significant problem with urinary function. Specifically, she does not have nocturia, frequency or hesitancy.  Vision: She denies any current MS related vision problem. She did have the double vision in 2003 without completely resolved. She has never had optic neuritis.  Fatigue/sleep: She denies any significant problem with physical or mental fatigue. 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   She sleeps well almost every night.  Mood/cognition: She feels mood is good now.   She is on sertraline and tolerates it well.    She takes care of her 10 yo father.   She denies any significant difficulty with cognition. She has not noted problems with her memory.  Hearing: She wears hearing aids bilaterally.  She has a little bit of tinnitus   She has a family history of deafness.  Other:   She has a torn meniscus in her right knee and is seeing orthopedic.      REVIEW OF SYSTEMS: Constitutional: No fevers, chills, sweats, or change in appetite Eyes: No visual changes, double vision, eye pain Ear, nose and throat: No hearing loss, ear pain, nasal congestion, sore throat Cardiovascular: No chest pain, palpitations Respiratory: No shortness of breath at rest or with exertion.   No wheezes GastrointestinaI: No nausea, vomiting, diarrhea, abdominal pain, fecal incontinence Genitourinary: No dysuria, urinary retention or frequency.  No nocturia. Musculoskeletal: No neck pain, back pain Integumentary: No rash,  pruritus, skin lesions Neurological: as above Psychiatric: No depression at this time.  No anxiety Endocrine: No palpitations, diaphoresis, change in appetite, change in weigh or increased thirst Hematologic/Lymphatic: No anemia, purpura, petechiae. Allergic/Immunologic: No itchy/runny eyes, nasal congestion, recent allergic reactions, rashes  ALLERGIES: Allergies  Allergen Reactions  . Codeine   . Penicillins     REACTION: rash  . Sulfonamide Derivatives   .  Sulfamethoxazole Rash    HOME MEDICATIONS:  Current outpatient prescriptions:  .  AVONEX PEN 30 MCG/0.5ML injection, , Disp: , Rfl:  .  baclofen (LIORESAL) 10 MG tablet, TAKE 1 TABLET BY MOUTH EVERY EVENING, Disp: , Rfl:  .  Cholecalciferol (VITAMIN D-3) 5000 UNITS TABS, Take by mouth daily., Disp: , Rfl:  .  ibuprofen (ADVIL,MOTRIN) 800 MG tablet, TAKE 1 TABLET BY MOUTH 3 TIMES A DAY AS NEEDED FOR PAIN, Disp: 90 tablet, Rfl: 1 .  KLOR-CON M10 10 MEQ tablet, TAKE 1 TABLET BY MOUTH EVERY DAY, Disp: 30 tablet, Rfl: 5 .  LACTOBACILLUS RHAMNOSUS, GG, PO, Take 4 capsules by mouth 4 (four) times daily as needed., Disp: , Rfl:  .  losartan-hydrochlorothiazide (HYZAAR) 100-12.5 MG per tablet, TAKE 1 TABLET BY MOUTH EVERY DAY FOR BLOOD PRESSURE, Disp: 30 tablet, Rfl: 0 .  Multiple Vitamin (MULTIVITAMIN) tablet, Take 1 tablet by mouth daily., Disp: , Rfl:  .  OVER THE COUNTER MEDICATION, Mega Red 2 a day, Disp: , Rfl:  .  Probiotic Product (PROBIOTIC DAILY PO), Take by mouth., Disp: , Rfl:  .  sertraline (ZOLOFT) 100 MG tablet, TAKE 1 TABLET (100 MG TOTAL) BY MOUTH DAILY., Disp: 30 tablet, Rfl: 5 .  simvastatin (ZOCOR) 40 MG tablet, TAKE 1 TABLET BY MOUTH AT BEDTIME, Disp: 30 tablet, Rfl: 0 .  vitamin E 400 UNIT capsule, Take 800 Units by mouth daily., Disp: , Rfl:   PAST MEDICAL HISTORY: Past Medical History  Diagnosis Date  . Hypertension   . MS (multiple sclerosis)   . Diverticulitis   . Allergy   . Insomnia   . Vitamin D deficiency     PAST SURGICAL HISTORY: Past Surgical History  Procedure Laterality Date  . Tonsillectomy    . Wisdom tooth extraction  1980's  . Colonoscopy  08/21/2009    FAMILY HISTORY: Family History  Problem Relation Age of Onset  . Hypertension Mother   . Hyperlipidemia Father   . Hearing loss Father   . Hyperlipidemia Brother   . Diabetes Brother   . Vision loss Maternal Grandmother   . Vision loss Paternal Grandmother   . Cancer Other     breast     SOCIAL HISTORY:  Social History   Social History  . Marital Status: Divorced    Spouse Name: N/A  . Number of Children: N/A  . Years of Education: 55   Occupational History  . work     Social History Main Topics  . Smoking status: Former Smoker -- 1.50 packs/day for 30 years    Types: Cigarettes    Start date: 12/26/1974    Quit date: 06/22/2004  . Smokeless tobacco: Never Used  . Alcohol Use: 0.6 oz/week    1 Cans of beer per week     Comment: rare social  . Drug Use: No  . Sexual Activity: Not Currently   Other Topics Concern  . Not on file   Social History Narrative   Coffee in the morning couple of cups    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  PHYSICAL EXAM  Filed Vitals:   02/18/15 0848  BP: 126/72  Pulse: 66  Resp: 16  Height: 5\' 4"  (1.626 m)  Weight: 191 lb (86.637 kg)    Body mass index is 32.77 kg/(m^2).   General: The patient is well-developed and well-nourished and in no acute distress  Musculoskeletal:  Back is nontender  Neurologic Exam  Mental status: The patient is alert and oriented x 3 at the time of the examination. The patient has apparent normal recent and remote memory, with an apparently normal attention span and concentration ability.   Speech is normal.  Cranial nerves: Extraocular movements are full.   Facial symmetry is present. There is good facial sensation to soft touch bilaterally.Facial strength is normal.  Trapezius and sternocleidomastoid strength is normal. No dysarthria is noted.  No obvious hearing deficits are noted.  Motor:  Muscle bulk is normal.   Tone is mildly increased in legs. Strength is  5 / 5 in all 4 extremities except 4+/5 in right EHL.   Sensory: Sensory testing is intact to pinprick, soft touch and vibration sensation in legs and arms  Coordination: Cerebellar testing reveals good finger-nose-finger and heel-to-shin bilaterally.  Gait and station: Station is normal.   Gait is minimally wide. Tandem gait  is wide. Romberg is negative.   Reflexes: Deep tendon reflexes are symmetric and increased bilaterally with  2 beats non-sustained clonus at ankles.        DIAGNOSTIC DATA (LABS, IMAGING, TESTING) - I reviewed patient records, labs, notes, testing and imaging myself where available.  I reviewed her LFT from 07/22/2014 ---  It was normal.       ASSESSMENT AND PLAN  Multiple sclerosis - Plan: MR Brain W Wo Contrast  Dysesthesia - Plan: MR Brain W Wo Contrast  Gait disturbance  Depression with anxiety   1.   Continue Avonex -  LFT's (PCP) recently normal.   I will check MRI c/s brain and compare to 3/15 MRI to see if any subclinical progression.  If present, we will need to consider a change in therapy. 2.   She is neurologically cleared to have any orthopedic procedure for the knee.    3.   Unable to tolerated imipramine.   Consider adding lamotrigine for dysesthesia.   4.  Return to see me in 6 months or sooner if there are new or worsening neurologic symptoms.  Richard A. Felecia Shelling, MD, PhD 5/59/7416, 3:84 AM Certified in Neurology, Clinical Neurophysiology, Sleep Medicine, Pain Medicine and Neuroimaging  [facility] [address], [place], [place] [address] [phone]

## 2015-03-09 ENCOUNTER — Other Ambulatory Visit: Payer: Self-pay | Admitting: Family Medicine

## 2015-03-10 ENCOUNTER — Telehealth: Payer: Self-pay | Admitting: Orthopedic Surgery

## 2015-03-10 NOTE — Telephone Encounter (Signed)
Patient called to request to speak with nurse regarding scheduling right knee surgery; states would like to have it done prior to end of this year.  Please call at either cell# [phone] or work # [phone]

## 2015-03-10 NOTE — Telephone Encounter (Signed)
"  She will call us to schedule arthroscopy right knee and partial medial meniscectomy"  That's my last note   Nurse should get this

## 2015-03-11 ENCOUNTER — Other Ambulatory Visit: Payer: Self-pay | Admitting: *Deleted

## 2015-03-11 NOTE — Telephone Encounter (Signed)
Surgery scheduled for Nov 4 per patient request

## 2015-03-13 ENCOUNTER — Encounter: Payer: Self-pay | Admitting: Orthopedic Surgery

## 2015-03-14 ENCOUNTER — Other Ambulatory Visit: Payer: Self-pay | Admitting: Family Medicine

## 2015-03-24 ENCOUNTER — Telehealth: Payer: Self-pay | Admitting: Orthopedic Surgery

## 2015-03-24 NOTE — Patient Instructions (Addendum)
Krystal Silva [facility]  03/24/2015     @PREFPERIOPPHARMACY @   Your procedure is scheduled on 03/27/2015.  Report to [facility] at 8:30 A.M.  Call this number if you have problems the morning of surgery:  [phone]   Remember:  Do not eat food or drink liquids after midnight.  Take these medicines the morning of surgery with A SIP OF WATER Baclofen, Zoloft   Do not wear jewelry, make-up or nail polish.  Do not wear lotions, powders, or perfumes.  You may wear deodorant.  Do not shave 48 hours prior to surgery.  Men may shave face and neck.  Do not bring valuables to the hospital.  [facility] is not responsible for any belongings or valuables.  Contacts, dentures or bridgework may not be worn into surgery.  Leave your suitcase in the car.  After surgery it may be brought to your room.  For patients admitted to the hospital, discharge time will be determined by your treatment team.  Patients discharged the day of surgery will not be allowed to drive home.    Please read over the following fact sheets that you were given. Surgical Site Infection Prevention and Anesthesia Post-op Instructions     PATIENT INSTRUCTIONS POST-ANESTHESIA  IMMEDIATELY FOLLOWING SURGERY:  Do not drive or operate machinery for the first twenty four hours after surgery.  Do not make any important decisions for twenty four hours after surgery or while taking narcotic pain medications or sedatives.  If you develop intractable nausea and vomiting or a severe headache please notify your doctor immediately.  FOLLOW-UP:  Please make an appointment with your surgeon as instructed. You do not need to follow up with anesthesia unless specifically instructed to do so.  WOUND CARE INSTRUCTIONS (if applicable):  Keep a dry clean dressing on the anesthesia/puncture wound site if there is drainage.  Once the wound has quit draining you may leave it open to air.  Generally you should leave the bandage intact for twenty four  hours unless there is drainage.  If the epidural site drains for more than 36-48 hours please call the anesthesia department.  QUESTIONS?:  Please feel free to call your physician or the hospital operator if you have any questions, and they will be happy to assist you.      Knee Arthroscopy Knee arthroscopy is a surgical procedure that is used to examine the inside of your knee joint and repair any damage. The surgeon puts a small, lighted instrument with a camera on the tip (arthroscope) through a small incision in your knee. The camera sends pictures to a monitor in the operating room. Your surgeon uses those pictures to guide the surgical instruments through other incisions to the area of damage. Knee arthroscopy can be used to treat many types of knee problems. It may be used:  To repair a torn ligament.  To repair or remove damaged tissue.  To remove a fluid-filled sac (cyst) from your knee. LET [facility] CARE PROVIDER KNOW ABOUT:  Any allergies you have.  All medicines you are taking, including vitamins, herbs, eye drops, creams, and over-the-counter medicines.  Previous problems you or members of your family have had with the use of anesthetics.  Any blood disorders you have.  Previous surgeries you have had.  Any medical conditions you may have. RISKS AND COMPLICATIONS Generally, this is a safe procedure. However, problems may occur, including:  Infection.  Bleeding.  Damage to blood vessels, nerves, or structures of  your knee.  A blood clot that forms in your leg and travels to your lung.  Failure to relieve symptoms. BEFORE THE PROCEDURE  Ask your health care provider about:  Changing or stopping your regular medicines. This is especially important if you are taking diabetes medicines or blood thinners.  Taking medicines such as aspirin and ibuprofen. These medicines can thin your blood. Do not take these medicines before your procedure if your health care  provider instructs you not to.  Follow your health care provider's instructions about eating or drinking restrictions.  Plan to have someone take you home after the procedure.  If you go home right after the procedure, plan to have someone with you for 24 hours.  Do not drink alcohol unless your health care provider says that you can.  Do not use any tobacco products, including cigarettes, chewing tobacco, or electronic cigarettes unless your health care provider says that you can. If you need help quitting, ask your health care provider.  You may have a physical exam. PROCEDURE  An IV tube will be inserted into one of your veins.  You will be given one or more of the following:  A medicine that helps you relax (sedative).  A medicine that numbs the area (local anesthetic).  A medicine that makes you fall asleep (general anesthetic).  A medicine that is injected into your spine that numbs the area below and slightly above the injection site (spinal anesthetic).  A medicine that is injected into an area of your body that numbs everything below the injection site (regional anesthetic).  A cuff may be placed around your upper leg to slow bleeding during the procedure.  The surgeon will make a small number of incisions around your knee.  Your knee joint will be flushed and filled with a germ-free (sterile) solution.  The arthroscope will be passed through an incision into your knee joint.  More instruments will be passed through other incisions to repair your knee as needed.  The fluid will be removed from your knee.  The incisions will be closed with adhesive strips or stitches (sutures).  A bandage (dressing) will be placed over your knee. The procedure may vary among health care providers and hospitals. AFTER THE PROCEDURE  Your blood pressure, heart rate, breathing rate and blood oxygen level will be monitored often until the medicines you were given have worn  off.  You may be given medicine for pain.  You may get crutches to help you walk without using your knee to support your body weight.  You may have to wear compression stockings. These stocking help to prevent blood clots and reduce swelling in your legs.   This information is not intended to replace advice given to you by your health care provider. Make sure you discuss any questions you have with your health care provider.   Document Released: 05/06/2000 Document Revised: 09/23/2014 Document Reviewed: 05/05/2014 Elsevier Interactive Patient Education 2016 Elsevier Inc.  Meniscus Injury, Arthroscopy Arthroscopy is a surgical procedure that involves the use of a small scope that has a camera and surgical instruments on the end (arthroscope). An arthroscope can be used to repair your meniscus injury.  LET [facility] CARE PROVIDER KNOW ABOUT:  Any allergies you have.  All medicines you are taking, including vitamins, herbs, eyedrops, creams, and over-the-counter medicines.  Any recent colds or infections you have had or currently have.  Previous problems you or members of your family have had with the use  of anesthetics.  Any blood disorders or blood clotting problems you have.  Previous surgeries you have had.  Medical conditions you have. RISKS AND COMPLICATIONS Generally, this is a safe procedure. However, as with any procedure, problems can occur. Possible problems include:  Damage to nerves or blood vessels.  Excess bleeding.  Blood clots.  Infection. BEFORE THE PROCEDURE  Do not eat or drink for 6-8 hours before the procedure.  Take medicines as directed by your surgeon. Ask your surgeon about changing or stopping your regular medicines.  You may have lab tests the morning of surgery. PROCEDURE  You will be given one of the following:   A medicine that numbs the area (local anesthesia).  A medicine that makes you go to sleep (general anesthesia).  A  medicine injected into your spine that numbs your body below the waist (spinal anesthesia). Most often, several small cuts (incisions) are made in the knee. The arthroscope and instruments go into the incisions to repair the damage. The torn portion of the meniscus is removed.  During this time, your surgeon may find a partial or complete tear in a cruciate ligament, such as the anterior cruciate ligament (ACL). A completely torn cruciate ligament is reconstructed by taking tissue from another part of the body (grafting) and placing it into the injured area. This requires several larger incisions to complete the repair. Sometimes, open surgery is needed for collateral ligament injuries. If a collateral ligament is found to be injured, your surgeon may staple or suture the tear through a slightly larger incision on the side of the knee. AFTER THE PROCEDURE You will be taken to the recovery area where your progress will be monitored. When you are awake, stable, and taking fluids without complications, you will be allowed to go home. This is usually the same day. However, more extensive repairs of a ligament may require an overnight stay.  The recovery time after repairing your meniscus or ligament depends on the amount of damage to these structures. It also depends on whether or not reconstructive knee surgery was needed.   A torn or stretched ligament (ligament sprain) may take 6-8 weeks to heal. It takes about the same amount of time if your surgeon removed a torn meniscus.  A repaired meniscus may require 6-12 weeks of recovery time.  A torn ligament needing reconstructive surgery may take 6-12 months to heal fully.   This information is not intended to replace advice given to you by your health care provider. Make sure you discuss any questions you have with your health care provider.   Document Released: 05/06/2000 Document Revised: 05/14/2013 Document Reviewed: 10/05/2012 Elsevier Interactive  Patient Education [employer].

## 2015-03-24 NOTE — Telephone Encounter (Signed)
Regarding out-patient surgery scheduled at [facility] 03/27/15, CPT 29881, 29880, contacted [place], Ph# [phone]; per Audrie Gallus, no pre-authorization is required; disclaimer read -subject to review at time of receipt of claim; must meed criteria and guidelines.  Reference: Audrie Gallus, 03/24/15, 4:46p.m.

## 2015-03-25 ENCOUNTER — Other Ambulatory Visit: Payer: Self-pay

## 2015-03-25 ENCOUNTER — Encounter (HOSPITAL_COMMUNITY): Payer: Self-pay

## 2015-03-25 ENCOUNTER — Encounter (HOSPITAL_COMMUNITY)
Admission: RE | Admit: 2015-03-25 | Discharge: 2015-03-25 | Disposition: A | Discharge status: Home / Self Care | Payer: BLUE CROSS/BLUE SHIELD | Source: Ambulatory Visit | Attending: Orthopedic Surgery | Admitting: Orthopedic Surgery

## 2015-03-25 DIAGNOSIS — X500XXA Overexertion from strenuous movement or load, initial encounter: Secondary | ICD-10-CM | POA: Diagnosis not present

## 2015-03-25 DIAGNOSIS — Z01812 Encounter for preprocedural laboratory examination: Secondary | ICD-10-CM | POA: Diagnosis not present

## 2015-03-25 DIAGNOSIS — F418 Other specified anxiety disorders: Secondary | ICD-10-CM | POA: Diagnosis not present

## 2015-03-25 DIAGNOSIS — G35 Multiple sclerosis: Secondary | ICD-10-CM | POA: Diagnosis not present

## 2015-03-25 DIAGNOSIS — S83241A Other tear of medial meniscus, current injury, right knee, initial encounter: Secondary | ICD-10-CM | POA: Diagnosis not present

## 2015-03-25 DIAGNOSIS — Z87891 Personal history of nicotine dependence: Secondary | ICD-10-CM | POA: Diagnosis not present

## 2015-03-25 DIAGNOSIS — I10 Essential (primary) hypertension: Secondary | ICD-10-CM | POA: Diagnosis not present

## 2015-03-25 DIAGNOSIS — Z880 Allergy status to penicillin: Secondary | ICD-10-CM | POA: Diagnosis not present

## 2015-03-25 DIAGNOSIS — Y939 Activity, unspecified: Secondary | ICD-10-CM | POA: Diagnosis not present

## 2015-03-25 DIAGNOSIS — Z79899 Other long term (current) drug therapy: Secondary | ICD-10-CM | POA: Diagnosis not present

## 2015-03-25 DIAGNOSIS — M1711 Unilateral primary osteoarthritis, right knee: Secondary | ICD-10-CM | POA: Diagnosis not present

## 2015-03-25 DIAGNOSIS — E559 Vitamin D deficiency, unspecified: Secondary | ICD-10-CM | POA: Diagnosis not present

## 2015-03-25 HISTORY — DX: Pure hypercholesterolemia, unspecified: E78.00

## 2015-03-25 LAB — BASIC METABOLIC PANEL
Anion gap: 7 (ref 5–15)
BUN: 14 mg/dL (ref 6–20)
CALCIUM: 9.1 mg/dL (ref 8.9–10.3)
CO2: 28 mmol/L (ref 22–32)
CREATININE: 0.84 mg/dL (ref 0.44–1.00)
Chloride: 106 mmol/L (ref 101–111)
GFR calc Af Amer: >60 mL/min (ref >60)
GLUCOSE: 122 mg/dL — AB (ref 65–99)
POTASSIUM: 3.7 mmol/L (ref 3.5–5.1)
SODIUM: 141 mmol/L (ref 135–145)

## 2015-03-25 LAB — CBC
HCT: 40.5 % (ref 36.0–46.0)
Hemoglobin: 13.7 g/dL (ref 12.0–15.0)
MCH: 30 pg (ref 26.0–34.0)
MCHC: 33.8 g/dL (ref 30.0–36.0)
MCV: 88.8 fL (ref 78.0–100.0)
PLATELETS: 226 10*3/uL (ref 150–400)
RBC: 4.56 MIL/uL (ref 3.87–5.11)
RDW: 13.1 % (ref 11.5–15.5)
WBC: 5.3 10*3/uL (ref 4.0–10.5)

## 2015-03-26 NOTE — H&P (Signed)
Krystal Silva is a 59 y.o. female.   HPI The patient reports the following: She twisted her knee back in January 2016 she felt some pain and swelling in the back of the knee and on the medial side of the knee this progressed to increased stiffness with a dull aching pain. She tried Aleve and ibuprofen her pain has only slightly improved and she feels that her knee is just not right. She says after sitting for long period of time when she gets up her knee feels like it will give out  Review of systems hearing loss or ringing of the ears sinusitis hayfever seasonal allergies no chest pain shortness of breath GI discomfort or urinary complaints Review of Systems See hpi    Past Medical History   Diagnosis  Date   .  Hypertension     .  MS (multiple sclerosis)     .  Diverticulitis     .  Allergy     .  Insomnia     .  Vitamin D deficiency         Allergies   Allergen  Reactions   .  Codeine     .  Penicillins         REACTION: rash   .  Sulfonamide Derivatives     .  Sulfamethoxazole  Rash       Current Outpatient Prescriptions   Medication  Sig  Dispense  Refill   .  AVONEX PEN 30 MCG/0.5ML injection         .  baclofen (LIORESAL) 10 MG tablet  TAKE 1 TABLET BY MOUTH EVERY EVENING       .  Cholecalciferol (VITAMIN D-3) 5000 UNITS TABS  Take by mouth daily.       [name]  ibuprofen (ADVIL,MOTRIN) 800 MG tablet  TAKE 1 TABLET BY MOUTH 3 TIMES A DAY AS NEEDED FOR PAIN  90 tablet  1   .  imipramine (TOFRANIL) 25 MG tablet  Take 1 tablet (25 mg total) by mouth at bedtime.  30 tablet  5   .  KLOR-CON M10 10 MEQ tablet  TAKE 1 TABLET BY MOUTH EVERY DAY  30 tablet  5   .  LACTOBACILLUS RHAMNOSUS, GG, PO  Take 4 capsules by mouth 4 (four) times daily as needed.       [name]  losartan-hydrochlorothiazide (HYZAAR) 100-12.5 MG per tablet  TAKE 1 TABLET BY MOUTH EVERY DAY FOR BLOOD PRESSURE  30 tablet  1   .  Multiple Vitamin (MULTIVITAMIN) tablet  Take 1 tablet by mouth daily.       .  Multiple  Vitamins tablet  Take 1 tablet by mouth.       [name]  OVER THE COUNTER MEDICATION  Mega Red 2 a day       .  potassium chloride (K-DUR) 10 MEQ tablet  Take 10 mEq by mouth.       .  Probiotic Product (PROBIOTIC DAILY PO)  Take by mouth.       .  RA KRILL OIL 500 MG CAPS  Take 1,000 mg by mouth.       .  sertraline (ZOLOFT) 100 MG tablet  TAKE 1 TABLET (100 MG TOTAL) BY MOUTH DAILY.  30 tablet  5   .  sertraline (ZOLOFT) 100 MG tablet  Take 100 mg by mouth.       .  simvastatin (ZOCOR) 20 MG tablet  Take 20 mg by  mouth.       .  simvastatin (ZOCOR) 40 MG tablet  TAKE 1 TABLET BY MOUTH AT BEDTIME  30 tablet  1   .  vitamin E 1000 UNIT capsule  Take 400 Int'l Units by mouth.       .  vitamin E 400 UNIT capsule  Take 800 Units by mouth daily.          No current facility-administered medications for this visit.      Physical Exam Blood pressure 113/75, height 5' 4.5" (1.638 m), weight 189 lb (85.73 kg). Physical Exam  The patient is well developed well nourished and well groomed.    Orientation to person place and time is normal    Mood is pleasant.  Ambulatory status she is walking with no support and without a limp  Inspection: Medial joint line is tender lateral joint line is slightly tender  ROM: Flexion is normal  Stability: Normal  Motor exam: Normal  Skin: Normal  Neuro: Normal  Vascular: Normal  Lymph: Not tested  Left knee full range of motion stability alignment and strength    Right knee McMurray's positive   Data Reviewed  Read xrays  x-rays including report I independently interpret the x-ray as mild arthritis Reports reviewed: YES  Diagnosis New with further work up yes     Encounter Diagnosis   Name  Primary?   .  Medial meniscus tear, right,  Yes    MRI report IMPRESSION: 1. Small oblique undersurface tear of the body and posterior horn of the medial meniscus with maceration of the anterior horn. Extrusion of the medial meniscus body. 2.  Severe medial compartment osteoarthritis. 3. 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     Electronically Signed   By: Dereck Ligas M.D.   On: 01/31/2015 10:49   Management MRI was obtained and she does have medial meniscal tear with some other arthritic changes   Recommend arthroscopy right knee partial medial meniscectomy

## 2015-03-27 ENCOUNTER — Encounter (HOSPITAL_COMMUNITY)
Admission: RE | Disposition: A | Discharge status: Home / Self Care | Payer: Self-pay | Source: Ambulatory Visit | Attending: Orthopedic Surgery

## 2015-03-27 ENCOUNTER — Ambulatory Visit (HOSPITAL_COMMUNITY): Payer: BLUE CROSS/BLUE SHIELD | Admitting: Anesthesiology

## 2015-03-27 ENCOUNTER — Encounter (HOSPITAL_COMMUNITY): Payer: Self-pay | Admitting: *Deleted

## 2015-03-27 ENCOUNTER — Ambulatory Visit (HOSPITAL_COMMUNITY)
Admission: RE | Admit: 2015-03-27 | Discharge: 2015-03-27 | Disposition: A | Discharge status: Home / Self Care | Payer: BLUE CROSS/BLUE SHIELD | Source: Ambulatory Visit | Attending: Orthopedic Surgery | Admitting: Orthopedic Surgery

## 2015-03-27 DIAGNOSIS — M23329 Other meniscus derangements, posterior horn of medial meniscus, unspecified knee: Secondary | ICD-10-CM | POA: Insufficient documentation

## 2015-03-27 DIAGNOSIS — E559 Vitamin D deficiency, unspecified: Secondary | ICD-10-CM | POA: Insufficient documentation

## 2015-03-27 DIAGNOSIS — Y939 Activity, unspecified: Secondary | ICD-10-CM | POA: Insufficient documentation

## 2015-03-27 DIAGNOSIS — G35 Multiple sclerosis: Secondary | ICD-10-CM | POA: Insufficient documentation

## 2015-03-27 DIAGNOSIS — S83241A Other tear of medial meniscus, current injury, right knee, initial encounter: Secondary | ICD-10-CM | POA: Diagnosis not present

## 2015-03-27 DIAGNOSIS — X500XXA Overexertion from strenuous movement or load, initial encounter: Secondary | ICD-10-CM | POA: Insufficient documentation

## 2015-03-27 DIAGNOSIS — M23321 Other meniscus derangements, posterior horn of medial meniscus, right knee: Secondary | ICD-10-CM

## 2015-03-27 DIAGNOSIS — Z87891 Personal history of nicotine dependence: Secondary | ICD-10-CM | POA: Insufficient documentation

## 2015-03-27 DIAGNOSIS — F418 Other specified anxiety disorders: Secondary | ICD-10-CM | POA: Insufficient documentation

## 2015-03-27 DIAGNOSIS — Z79899 Other long term (current) drug therapy: Secondary | ICD-10-CM | POA: Insufficient documentation

## 2015-03-27 DIAGNOSIS — Z9889 Other specified postprocedural states: Secondary | ICD-10-CM

## 2015-03-27 DIAGNOSIS — M1711 Unilateral primary osteoarthritis, right knee: Secondary | ICD-10-CM | POA: Insufficient documentation

## 2015-03-27 DIAGNOSIS — I10 Essential (primary) hypertension: Secondary | ICD-10-CM | POA: Insufficient documentation

## 2015-03-27 DIAGNOSIS — Z880 Allergy status to penicillin: Secondary | ICD-10-CM | POA: Insufficient documentation

## 2015-03-27 DIAGNOSIS — Z01812 Encounter for preprocedural laboratory examination: Secondary | ICD-10-CM | POA: Insufficient documentation

## 2015-03-27 HISTORY — PX: KNEE ARTHROSCOPY WITH MEDIAL MENISECTOMY: SHX5651

## 2015-03-27 SURGERY — ARTHROSCOPY, KNEE, WITH MEDIAL MENISCECTOMY
Anesthesia: General | Site: Knee | Laterality: Right

## 2015-03-27 MED ORDER — DEXAMETHASONE SODIUM PHOSPHATE 4 MG/ML IJ SOLN
INTRAMUSCULAR | Status: AC
  Filled 2015-03-27: qty 1

## 2015-03-27 MED ORDER — FENTANYL CITRATE (PF) 100 MCG/2ML IJ SOLN
INTRAMUSCULAR | Status: AC
  Filled 2015-03-27: qty 2

## 2015-03-27 MED ORDER — LIDOCAINE HCL (PF) 1 % IJ SOLN
INTRAMUSCULAR | Status: AC
  Filled 2015-03-27: qty 5

## 2015-03-27 MED ORDER — MIDAZOLAM HCL 2 MG/2ML IJ SOLN
1.0000 mg | INTRAMUSCULAR | Status: DC | PRN
  Administered 2015-03-27: 2 mg via INTRAVENOUS

## 2015-03-27 MED ORDER — GLYCOPYRROLATE 0.2 MG/ML IJ SOLN
INTRAMUSCULAR | Status: AC
  Filled 2015-03-27: qty 1

## 2015-03-27 MED ORDER — DEXTROSE 5 % IV SOLN
INTRAVENOUS | Status: DC | PRN
  Administered 2015-03-27: 10:00:00 via INTRAVENOUS

## 2015-03-27 MED ORDER — LACTATED RINGERS IV SOLN
INTRAVENOUS | Status: DC | PRN
  Administered 2015-03-27: 09:00:00 via INTRAVENOUS

## 2015-03-27 MED ORDER — VANCOMYCIN HCL IN DEXTROSE 1-5 GM/200ML-% IV SOLN
INTRAVENOUS | Status: AC
  Filled 2015-03-27: qty 200

## 2015-03-27 MED ORDER — ONDANSETRON HCL 4 MG/2ML IJ SOLN
4.0000 mg | Freq: Once | INTRAMUSCULAR | Status: AC
  Administered 2015-03-27: 4 mg via INTRAVENOUS

## 2015-03-27 MED ORDER — BUPIVACAINE-EPINEPHRINE (PF) 0.5% -1:200000 IJ SOLN
INTRAMUSCULAR | Status: AC
  Filled 2015-03-27: qty 60

## 2015-03-27 MED ORDER — BUPIVACAINE-EPINEPHRINE (PF) 0.5% -1:200000 IJ SOLN
INTRAMUSCULAR | Status: DC | PRN
  Administered 2015-03-27: 60 mL

## 2015-03-27 MED ORDER — LACTATED RINGERS IV SOLN
INTRAVENOUS | Status: DC
  Administered 2015-03-27: 10:00:00 via INTRAVENOUS

## 2015-03-27 MED ORDER — EPINEPHRINE HCL 1 MG/ML IJ SOLN
INTRAMUSCULAR | Status: AC
  Filled 2015-03-27: qty 5

## 2015-03-27 MED ORDER — FENTANYL CITRATE (PF) 100 MCG/2ML IJ SOLN
INTRAMUSCULAR | Status: AC
  Filled 2015-03-27: qty 4

## 2015-03-27 MED ORDER — PROPOFOL 10 MG/ML IV BOLUS
INTRAVENOUS | Status: DC | PRN
  Administered 2015-03-27: 150 mg via INTRAVENOUS
  Administered 2015-03-27: 20 mg via INTRAVENOUS

## 2015-03-27 MED ORDER — MIDAZOLAM HCL 2 MG/2ML IJ SOLN
INTRAMUSCULAR | Status: AC
  Filled 2015-03-27: qty 2

## 2015-03-27 MED ORDER — VANCOMYCIN HCL IN DEXTROSE 1-5 GM/200ML-% IV SOLN
1000.0000 mg | INTRAVENOUS | Status: AC
  Administered 2015-03-27: 1000 mg via INTRAVENOUS

## 2015-03-27 MED ORDER — LIDOCAINE HCL (CARDIAC) 20 MG/ML IV SOLN
INTRAVENOUS | Status: DC | PRN
  Administered 2015-03-27: 50 mg via INTRAVENOUS

## 2015-03-27 MED ORDER — PROPOFOL 10 MG/ML IV BOLUS
INTRAVENOUS | Status: AC
  Filled 2015-03-27: qty 20

## 2015-03-27 MED ORDER — FENTANYL CITRATE (PF) 100 MCG/2ML IJ SOLN
INTRAMUSCULAR | Status: DC | PRN
  Administered 2015-03-27 (×2): 25 ug via INTRAVENOUS
  Administered 2015-03-27: 50 ug via INTRAVENOUS

## 2015-03-27 MED ORDER — PROMETHAZINE HCL 12.5 MG PO TABS: 12.5000 mg | ORAL_TABLET | Freq: Four times a day (QID) | ORAL | Status: DC | PRN

## 2015-03-27 MED ORDER — ONDANSETRON HCL 4 MG/2ML IJ SOLN: 4.0000 mg | Freq: Once | INTRAMUSCULAR | Status: DC | PRN

## 2015-03-27 MED ORDER — ONDANSETRON HCL 4 MG/2ML IJ SOLN
INTRAMUSCULAR | Status: AC
  Filled 2015-03-27: qty 2

## 2015-03-27 MED ORDER — SODIUM CHLORIDE 0.9 % IR SOLN
Status: DC | PRN
Start: 2015-03-27 — End: 2015-03-27
  Administered 2015-03-27 (×4): 3000 mL

## 2015-03-27 MED ORDER — FENTANYL CITRATE (PF) 100 MCG/2ML IJ SOLN
25.0000 ug | INTRAMUSCULAR | Status: DC | PRN
  Administered 2015-03-27 (×2): 50 ug via INTRAVENOUS

## 2015-03-27 MED ORDER — DEXAMETHASONE SODIUM PHOSPHATE 4 MG/ML IJ SOLN
4.0000 mg | Freq: Once | INTRAMUSCULAR | Status: AC
  Administered 2015-03-27: 4 mg via INTRAVENOUS

## 2015-03-27 MED ORDER — CHLORHEXIDINE GLUCONATE 4 % EX LIQD: 60.0000 mL | Freq: Once | CUTANEOUS | Status: DC

## 2015-03-27 MED ORDER — FENTANYL CITRATE (PF) 100 MCG/2ML IJ SOLN
25.0000 ug | INTRAMUSCULAR | Status: AC
  Administered 2015-03-27: 25 ug via INTRAVENOUS

## 2015-03-27 MED ORDER — SUCCINYLCHOLINE CHLORIDE 20 MG/ML IJ SOLN
INTRAMUSCULAR | Status: AC
  Filled 2015-03-27: qty 1

## 2015-03-27 MED ORDER — HYDROCODONE-ACETAMINOPHEN 7.5-325 MG PO TABS: 1.0000 | ORAL_TABLET | ORAL | Status: DC | PRN

## 2015-03-27 SURGICAL SUPPLY — 56 items
ARTHROWAND PARAGON T2 (SURGICAL WAND)
BAG HAMPER (MISCELLANEOUS) ×3 IMPLANT
BANDAGE ELASTIC 6 VELCRO NS (GAUZE/BANDAGES/DRESSINGS) ×3 IMPLANT
BIT DRILL 2.0X128 (BIT) ×1 IMPLANT
BIT DRILL 2.0X128MM (BIT) ×1
BLADE AGGRESSIVE PLUS 4.0 (BLADE) ×3 IMPLANT
CHLORAPREP W/TINT 26ML (MISCELLANEOUS) ×6 IMPLANT
CLOTH BEACON ORANGE TIMEOUT ST (SAFETY) ×3 IMPLANT
COOLER CRYO IC GRAV AND TUBE (ORTHOPEDIC SUPPLIES) ×3 IMPLANT
CUFF CRYO KNEE LG 20X31 COOLER (ORTHOPEDIC SUPPLIES) IMPLANT
CUFF CRYO KNEE18X23 MED (MISCELLANEOUS) ×2 IMPLANT
CUFF TOURNIQUET SINGLE 34IN LL (TOURNIQUET CUFF) ×2 IMPLANT
CUFF TOURNIQUET SINGLE 44IN (TOURNIQUET CUFF) IMPLANT
CUTTER ANGLED DBL BITE 4.5 (BURR) IMPLANT
DECANTER SPIKE VIAL GLASS SM (MISCELLANEOUS) ×6 IMPLANT
GAUZE SPONGE 4X4 12PLY STRL (GAUZE/BANDAGES/DRESSINGS) ×3 IMPLANT
GAUZE SPONGE 4X4 16PLY XRAY LF (GAUZE/BANDAGES/DRESSINGS) ×3 IMPLANT
GAUZE XEROFORM 5X9 LF (GAUZE/BANDAGES/DRESSINGS) ×3 IMPLANT
GLOVE BIO SURGEON STRL SZ7 (GLOVE) ×2 IMPLANT
GLOVE BIOGEL PI IND STRL 7.0 (GLOVE) IMPLANT
GLOVE BIOGEL PI INDICATOR 7.0 (GLOVE) ×2
GLOVE EXAM NITRILE MD LF STRL (GLOVE) ×2 IMPLANT
GLOVE SKINSENSE NS SZ8.0 LF (GLOVE) ×2
GLOVE SKINSENSE STRL SZ8.0 LF (GLOVE) ×1 IMPLANT
GLOVE SS N UNI LF 8.5 STRL (GLOVE) ×3 IMPLANT
GOWN STRL REUS W/ TWL LRG LVL3 (GOWN DISPOSABLE) ×1 IMPLANT
GOWN STRL REUS W/TWL LRG LVL3 (GOWN DISPOSABLE) ×3
GOWN STRL REUS W/TWL XL LVL3 (GOWN DISPOSABLE) ×3 IMPLANT
HLDR LEG FOAM (MISCELLANEOUS) ×1 IMPLANT
IV NS IRRIG 3000ML ARTHROMATIC (IV SOLUTION) ×10 IMPLANT
KIT BLADEGUARD II DBL (SET/KITS/TRAYS/PACK) ×3 IMPLANT
KIT ROOM TURNOVER AP CYSTO (KITS) ×3 IMPLANT
LEG HOLDER FOAM (MISCELLANEOUS) ×2
MANIFOLD NEPTUNE II (INSTRUMENTS) ×3 IMPLANT
MARKER SKIN DUAL TIP RULER LAB (MISCELLANEOUS) ×3 IMPLANT
NDL HYPO 18GX1.5 BLUNT FILL (NEEDLE) ×1 IMPLANT
NDL HYPO 21X1.5 SAFETY (NEEDLE) ×1 IMPLANT
NDL SPNL 18GX3.5 QUINCKE PK (NEEDLE) ×1 IMPLANT
NEEDLE HYPO 18GX1.5 BLUNT FILL (NEEDLE) ×3 IMPLANT
NEEDLE HYPO 21X1.5 SAFETY (NEEDLE) ×3 IMPLANT
NEEDLE SPNL 18GX3.5 QUINCKE PK (NEEDLE) ×3 IMPLANT
NS IRRIG 1000ML POUR BTL (IV SOLUTION) ×3 IMPLANT
PACK ARTHRO LIMB DRAPE STRL (MISCELLANEOUS) ×3 IMPLANT
PAD ABD 5X9 TENDERSORB (GAUZE/BANDAGES/DRESSINGS) ×3 IMPLANT
PAD ARMBOARD 7.5X6 YLW CONV (MISCELLANEOUS) ×3 IMPLANT
PADDING CAST COTTON 6X4 STRL (CAST SUPPLIES) ×3 IMPLANT
SET ARTHROSCOPY INST (INSTRUMENTS) ×3 IMPLANT
SET ARTHROSCOPY PUMP TUBE (IRRIGATION / IRRIGATOR) ×3 IMPLANT
SET BASIN LINEN APH (SET/KITS/TRAYS/PACK) ×3 IMPLANT
SUT ETHILON 3 0 FSL (SUTURE) IMPLANT
SYR 30ML LL (SYRINGE) ×3 IMPLANT
SYRINGE 10CC LL (SYRINGE) ×3 IMPLANT
WAND 50 DEG COVAC W/CORD (SURGICAL WAND) IMPLANT
WAND 90 DEG TURBOVAC W/CORD (SURGICAL WAND) IMPLANT
WAND ARTHRO PARAGON T2 (SURGICAL WAND) IMPLANT
YANKAUER SUCT BULB TIP 10FT TU (MISCELLANEOUS) ×9 IMPLANT

## 2015-03-27 NOTE — Discharge Instructions (Signed)
Operative findings included Torn medial meniscus Severe arthritis of the inner part of the joint  This was treated with partial removal of the torn meniscal fragment and drilling of the arthritic area

## 2015-03-27 NOTE — Transfer of Care (Signed)
Immediate Anesthesia Transfer of Care Note  Patient: Krystal Silva [facility]  Procedure(s) Performed: Procedure(s): KNEE ARTHROSCOPY WITH PARTIAL MEDIAL MENISECTOMY AND MICROFRACTURE (Right)  Patient Location: PACU  Anesthesia Type:General  Level of Consciousness: awake, alert , oriented and patient cooperative  Airway & Oxygen Therapy: Patient Spontanous Breathing and Patient connected to face mask oxygen  Post-op Assessment: Report given to RN and Post -op Vital signs reviewed and stable  Post vital signs: Reviewed and stable  Last Vitals:  Filed Vitals:   03/27/15 0950  BP: 120/60  Pulse:   Temp:   Resp:     Complications: No apparent anesthesia complications

## 2015-03-27 NOTE — Progress Notes (Signed)
Hearing aids given to pt and placed in bilateral ears

## 2015-03-27 NOTE — Anesthesia Preprocedure Evaluation (Signed)
Anesthesia Evaluation  Patient identified by MRN, date of birth, ID band Patient awake    Reviewed: Allergy & Precautions, NPO status , Patient's Chart, lab work & pertinent test results  Airway Mallampati: II  TM Distance: >3 FB     Dental  (+) Teeth Intact, Dental Advisory Given   Pulmonary former smoker,    breath sounds clear to auscultation       Cardiovascular hypertension, Pt. on medications  Rhythm:Regular Rate:Normal     Neuro/Psych PSYCHIATRIC DISORDERS Anxiety Depression Hx MS (multiple sclerosis)     GI/Hepatic negative GI ROS,   Endo/Other    Renal/GU      Musculoskeletal   Abdominal   Peds  Hematology   Anesthesia Other Findings bilat hearing aids   Reproductive/Obstetrics                             Anesthesia Physical Anesthesia Plan  ASA: III  Anesthesia Plan: General   Post-op Pain Management:    Induction: Intravenous  Airway Management Planned: LMA  Additional Equipment:   Intra-op Plan:   Post-operative Plan: Extubation in OR  Informed Consent: I have reviewed the patients History and Physical, chart, labs and discussed the procedure including the risks, benefits and alternatives for the proposed anesthesia with the patient or authorized representative who has indicated his/her understanding and acceptance.     Plan Discussed with:   Anesthesia Plan Comments:         Anesthesia Quick Evaluation

## 2015-03-27 NOTE — Interval H&P Note (Signed)
History and Physical Interval Note:  03/27/2015 9:05 AM  Krystal Silva  has presented today for surgery, with the diagnosis of right meniscal tear  The various methods of treatment have been discussed with the patient and family. After consideration of risks, benefits and other options for treatment, the patient has consented to  Procedure(s) with comments: KNEE ARTHROSCOPY WITH MEDIAL MENISECTOMY (Right) - partial medial menisectomy *needs crutch training per office* as a surgical intervention .  The patient's history has been reviewed, patient examined, no change in status, stable for surgery.  I have reviewed the patient's chart and labs.  Questions were answered to the patient's satisfaction.     Arther Abbott

## 2015-03-27 NOTE — OR Nursing (Signed)
Hearing aids removed and placed in plastic cup , placed in personal belonging bag

## 2015-03-27 NOTE — Anesthesia Postprocedure Evaluation (Signed)
  Anesthesia Post-op Note  Patient: Krystal Silva [facility]  Procedure(s) Performed: Procedure(s): KNEE ARTHROSCOPY WITH PARTIAL MEDIAL MENISECTOMY AND MICROFRACTURE (Right)  Patient Location: PACU  Anesthesia Type:General  Level of Consciousness: awake, alert , oriented and patient cooperative  Airway and Oxygen Therapy: Patient Spontanous Breathing  Post-op Pain: mild  Post-op Assessment: Post-op Vital signs reviewed, Patient's Cardiovascular Status Stable, Respiratory Function Stable, Patent Airway, No signs of Nausea or vomiting and Pain level controlled              Post-op Vital Signs: Reviewed and stable  Last Vitals:  Filed Vitals:   03/27/15 1105  BP: 133/69  Pulse: 66  Temp:   Resp: 15    Complications: No apparent anesthesia complications

## 2015-03-27 NOTE — Brief Op Note (Addendum)
03/27/2015  10:45 AM  PATIENT:  Krystal Silva  59 y.o. female  PRE-OPERATIVE DIAGNOSIS:  right knee medial meniscal tear  POST-OPERATIVE DIAGNOSIS:  osteoarthritis, torn medial meniscus right knee  PROCEDURE:  Procedure(s): KNEE ARTHROSCOPY WITH PARTIAL MEDIAL MENISECTOMY AND MICROFRACTURE (Right)   Operative findings:  The lateral compartment normal meniscus  Anterior cruciate ligament intact  PCL normal  Grade 2 chondromalacia with a fissure in the patella, trochlea normal  Torn medial meniscus posterior horn complex tear with grade 4 chondral lesion 12 mm long by 3-4 mm wide on the weightbearing surface of the medial femoral condyle  Synovitis while to moderate   Surgical technique   Site marking and confirmation confirmed as right knee marked as such. Chart reviewed and updated. The patient was taken to the operating room given appropriate preoperative antibiotics based on penicillin allergy.   Patient had LMA anesthesia in the supine position. Right leg was prepped and draped sterilely left leg was padded and placed in flexion. Timeout was completed  The portal on the lateral side was established scope was placed into the joint. Diagnostic arthroscopy was completed by reviewing all compartments and gutters. Findings are listed above. Medial portal was established and the probe was placed in the joint. Complex tear medial meniscus was resected with arthroscopic biters and meniscal fragments were removed with an arthroscopic shaver. Mild balancing of the meniscus was continued with the shaver. The probe was placed back in the joint to confirm. Rim was stable.   Then as a second procedure Chondral lesion was noted over the medial femoral condyle, it was grade 4 and was debrided until a stable rim was obtained. A spinal needle was used to obtain appropriate angle to drill the lesion. Then a 2.0 drill bit was used to perform microfracture multiple holes were placed. Arthroscopic  fluid was turned off arthroscopic shaver was placed in all holes had excellent bleeding indicating marrow stimulation  The joint irrigated with wash mode of arthroscopic pump and portals were closed with 3-0 nylon  Injection of Marcaine was with epinephrine was performed and postop anesthesia  SURGEON:  Surgeon(s) and Role:    * Carole Civil, MD - Primary  PHYSICIAN ASSISTANT:   ASSISTANTS: none   ANESTHESIA:   general  EBL:  Total I/O In: 400 [I.V.:400] Out: 5 [Blood:5]  BLOOD ADMINISTERED:none  DRAINS: none   LOCAL MEDICATIONS USED:  MARCAINE     SPECIMEN:  No Specimen  DISPOSITION OF SPECIMEN:  N/A  COUNTS:  YES  TOURNIQUET:    DICTATION: .Dragon Dictation  PLAN OF CARE: Discharge to home after PACU  PATIENT DISPOSITION:  PACU - hemodynamically stable.   Delay start of Pharmacological VTE agent (>24hrs) due to surgical blood loss or risk of bleeding: not applicable  The patient will be weightbearing as tolerated range of motion is tolerated we will apply a hinged knee brace in the office to unload the joint

## 2015-03-27 NOTE — Op Note (Addendum)
03/27/2015  10:45 AM  PATIENT:  Krystal Silva  59 y.o. female  PRE-OPERATIVE DIAGNOSIS:  right knee medial meniscal tear  POST-OPERATIVE DIAGNOSIS:  osteoarthritis, torn medial meniscus right knee  PROCEDURE:  Procedure(s): KNEE ARTHROSCOPY WITH PARTIAL MEDIAL MENISECTOMY AND MICROFRACTURE (Right)   Operative findings:  The lateral compartment normal meniscus  Anterior cruciate ligament intact  PCL normal  Grade 2 chondromalacia with a fissure in the patella, trochlea normal  Torn medial meniscus posterior horn complex tear with grade 4 chondral lesion 12 mm long by 3-4 mm wide on the weightbearing surface of the medial femoral condyle  Synovitis while to moderate   Surgical technique   Site marking and confirmation confirmed as right knee marked as such. Chart reviewed and updated. The patient was taken to the operating room given appropriate preoperative antibiotics based on penicillin allergy.   Patient had LMA anesthesia in the supine position. Right leg was prepped and draped sterilely left leg was padded and placed in flexion. Timeout was completed  The portal on the lateral side was established scope was placed into the joint. Diagnostic arthroscopy was completed by reviewing all compartments and gutters. Findings are listed above. Medial portal was established and the probe was placed in the joint. Complex tear medial meniscus was resected with arthroscopic biters and meniscal fragments were removed with an arthroscopic shaver. Mild balancing of the meniscus was continued with the shaver. The probe was placed back in the joint to confirm. Rim was stable.   Then as a second procedure: Chondral lesion was noted over the medial femoral condyle, it was grade 4 and was debrided until a stable rim was obtained. A spinal needle was used to obtain appropriate angle to drill the lesion. Then a 2.0 drill bit was used to perform microfracture multiple holes were placed. Arthroscopic  fluid was turned off arthroscopic shaver was placed in all holes had excellent bleeding indicating marrow stimulation  The joint irrigated with wash mode of arthroscopic pump and portals were closed with 3-0 nylon  Injection of Marcaine was with epinephrine was performed and postop anesthesia  SURGEON:  Surgeon(s) and Role:    * Carole Civil, MD - Primary  PHYSICIAN ASSISTANT:   ASSISTANTS: none   ANESTHESIA:   general  EBL:  Total I/O In: 400 [I.V.:400] Out: 5 [Blood:5]  BLOOD ADMINISTERED:none  DRAINS: none   LOCAL MEDICATIONS USED:  MARCAINE     SPECIMEN:  No Specimen  DISPOSITION OF SPECIMEN:  N/A  COUNTS:  YES  TOURNIQUET:    DICTATION: .Dragon Dictation  PLAN OF CARE: Discharge to home after PACU  PATIENT DISPOSITION:  PACU - hemodynamically stable.   Delay start of Pharmacological VTE agent (>24hrs) due to surgical blood loss or risk of bleeding: not applicable  The patient will be weightbearing as tolerated range of motion is tolerated we will apply a hinged knee brace in the office to unload the joint

## 2015-03-27 NOTE — Anesthesia Procedure Notes (Signed)
Procedure Name: LMA Insertion Date/Time: 03/27/2015 10:00 AM Performed by: Andree Elk, Kimetha Trulson A Pre-anesthesia Checklist: Patient identified, Timeout performed, Emergency Drugs available, Suction available and Patient being monitored Patient Re-evaluated:Patient Re-evaluated prior to inductionOxygen Delivery Method: Circle system utilized Preoxygenation: Pre-oxygenation with 100% oxygen Intubation Type: IV induction Ventilation: Mask ventilation without difficulty LMA: LMA inserted LMA Size: 4.0 Number of attempts: 1 Placement Confirmation: positive ETCO2 and breath sounds checked- equal and bilateral Tube secured with: Tape Dental Injury: Teeth and Oropharynx as per pre-operative assessment

## 2015-03-30 ENCOUNTER — Encounter: Payer: Self-pay | Admitting: Orthopedic Surgery

## 2015-03-30 ENCOUNTER — Ambulatory Visit (INDEPENDENT_AMBULATORY_CARE_PROVIDER_SITE_OTHER): Payer: BLUE CROSS/BLUE SHIELD | Admitting: Orthopedic Surgery

## 2015-03-30 ENCOUNTER — Encounter (HOSPITAL_COMMUNITY): Payer: Self-pay | Admitting: Orthopedic Surgery

## 2015-03-30 VITALS — BP 138/85 | Ht 64.0 in | Wt 190.0 lb

## 2015-03-30 DIAGNOSIS — Z9889 Other specified postprocedural states: Secondary | ICD-10-CM

## 2015-03-30 MED ORDER — HYDROCODONE-ACETAMINOPHEN 7.5-325 MG PO TABS: 1.0000 | ORAL_TABLET | ORAL | Status: DC | PRN

## 2015-03-30 NOTE — Patient Instructions (Signed)
CALL [facility] THERAPY DEPT TO SCHEDULE THERAPY VISITS 

## 2015-03-30 NOTE — Progress Notes (Signed)
Patient ID: Krystal Silva, female   DOB: [birth year], 57 y.o.   MRN: [id number]  Follow up visit  Chief Complaint  Patient presents with  . Follow-up    post op 1, SARK W/PARTIAL MM, DOS 03/27/15    BP 138/85 mmHg  Ht 5\' 4"  (1.626 m)  Wt 190 lb (86.183 kg)  BMI 32.60 kg/m2  Encounter Diagnosis  Name Primary?  . S/P right knee arthroscopy Yes     the patient is status post right knee arthroscopy partial medial meniscectomy and microfracture of her right knee. She is doing well on current hydrocodone 7.5 mg.  Her incisions look fine she has mild swelling she is using a 4 pronged cane which I advised her to put in the opposite hand surgery   We placed her in a hinged brace to unload the joint she is otherwise weightbearing as tolerated with the hinge brace. She can start range of motion exercises strengthening exercises and control swelling with cryotherapy  Follow-up 3 weeks

## 2015-03-31 ENCOUNTER — Ambulatory Visit (HOSPITAL_COMMUNITY): Payer: BLUE CROSS/BLUE SHIELD | Attending: Orthopedic Surgery | Admitting: Physical Therapy

## 2015-03-31 DIAGNOSIS — M25661 Stiffness of right knee, not elsewhere classified: Secondary | ICD-10-CM

## 2015-03-31 DIAGNOSIS — R6889 Other general symptoms and signs: Secondary | ICD-10-CM | POA: Diagnosis present

## 2015-03-31 DIAGNOSIS — R262 Difficulty in walking, not elsewhere classified: Secondary | ICD-10-CM | POA: Diagnosis present

## 2015-03-31 DIAGNOSIS — R29898 Other symptoms and signs involving the musculoskeletal system: Secondary | ICD-10-CM | POA: Diagnosis present

## 2015-03-31 DIAGNOSIS — M25561 Pain in right knee: Secondary | ICD-10-CM | POA: Diagnosis not present

## 2015-03-31 NOTE — Patient Instructions (Signed)
Quad Set    With other leg bent, foot flat, slowly tighten muscles on thigh of straight leg while counting out loud to __3__. Repeat with other leg. Repeat __15__ times. Do __1__ sessions per day.  http://gt2.exer.us/276   Copyright  VHI. All rights reserved.  Heel Slide    Bend knee and pull heel toward buttocks. Hold __3__ seconds. Return. Repeat with other knee. Repeat __15__ times. Do __1__ sessions per day.  http://gt2.exer.us/372   Copyright  VHI. All rights reserved.  Short Arc [employer] a large can or rolled towel under leg. Straighten knee and leg. Hold __3__ seconds. Repeat with other leg. Repeat __15__ times. Do __1__ sessions per day.  http://gt2.exer.us/366   Copyright  VHI. All rights reserved.

## 2015-03-31 NOTE — Therapy (Signed)
[place], [place], [address] Phone: [phone]   Fax:  [phone]  Physical Therapy Evaluation  Patient Details  Name: Krystal Silva MRN: [id number] Date of Birth: 11-14-1955 Referring Provider: Arther Abbott MD  Encounter Date: 03/31/2015      PT End of Session - 03/31/15 1651    Visit Number 1   Number of Visits 10   Date for PT Re-Evaluation 04/30/15   Authorization Type BC[employer]   Authorization Time Period 03/31/15-05/31/15   PT Start Time 1432   PT Stop Time 1515   PT Time Calculation (min) 43 min   Activity Tolerance Patient tolerated treatment well   Behavior During Therapy [facility] for tasks assessed/performed      Past Medical History  Diagnosis Date  . Hypertension   . MS (multiple sclerosis) ([place])   . Diverticulitis   . Allergy   . Insomnia   . Vitamin D deficiency   . Hypercholesteremia     Past Surgical History  Procedure Laterality Date  . Tonsillectomy    . Wisdom tooth extraction  1980's  . Colonoscopy  08/21/2009  . Knee arthroscopy with medial menisectomy Right 03/27/2015    Procedure: KNEE ARTHROSCOPY WITH PARTIAL MEDIAL MENISECTOMY AND MICROFRACTURE;  Surgeon: Carole Civil, MD;  Location: [facility];  Service: Orthopedics;  Laterality: Right;    There were no vitals filed for this visit.  Visit Diagnosis:  Right knee pain  Stiffness of right knee  Difficulty walking  Decreased functional activity tolerance  Weakness of right leg      Subjective Assessment - 03/31/15 1438    Subjective Pt reports that she had R knee arthroscopy done on 03/27/15. She reports that she has difficulty with straightening her knee, especially after she has been sitting for a while. She also has trouble bending the knee very far, and has had a lot of swelling. She is able to navigate stairs at the time, but it takes her a while. She is having difficulty walking, and has been walking with a quad  cane.    How long can you sit comfortably? 30 minutes   How long can you stand comfortably? 15-20 minutes   How long can you walk comfortably? 15 minutes   Patient Stated Goals Be able to move her knee without pain, increase mobility of knee, be able to return to exercise and dancing, be able to take long walks without pain   Pain Score 5    Pain Location Knee   Pain Orientation Right            [place] PT Assessment - 03/31/15 0001    Assessment   Medical Diagnosis R knee arthroscopy   Referring Provider Arther Abbott MD   Onset Date/Surgical Date 03/27/15   Next MD Visit 04/21/15   Prior Therapy no   Restrictions   Weight Bearing Restrictions Yes   RLE Weight Bearing Weight bearing as tolerated   Balance Screen   Has the patient fallen in the past 6 months No   Has the patient had a decrease in activity level because of a fear of falling?  No   Is the patient reluctant to leave their home because of a fear of falling?  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  Suprapatellar: 47 cm, infrapatellar: 38 cm   Circumferential - Left  Joint line: 40 cm, suprapatellar: 46 cm, infrapatellar: 37 cm   ROM / Strength   AROM / PROM / Strength AROM;PROM;Strength   AROM   AROM  Assessment Site Knee   Right/Left Knee Right   Right Knee Extension -10   Right Knee Flexion 93   PROM   PROM Assessment Site Knee   Right/Left Knee Right   Right Knee Extension 105   Right Knee Flexion -8   Strength   Strength Assessment Site Hip;Knee   Right Hip Flexion 3+/5   Right Hip Extension 3+/5   Right Hip ABduction 4/5   Left Hip Flexion 4/5   Left Hip Extension 4/5   Left Hip ABduction 4+/5   Right/Left Knee Right;Left   Right Knee Flexion 3-/5   Right Knee Extension 3/5   Left Knee Flexion 4/5   Left Knee Extension 4+/5   Transfers   Five time sit to stand comments  13.53   Ambulation/Gait   Ambulation Distance (Feet) 518 Feet   Gait Pattern Decreased step length - left;Decreased stance time - right;Decreased hip/knee flexion - right;Decreased weight shift to right;Antalgic   6 minute walk test results    Endurance additional comments 3 minute walk test: 518                           PT Education - 03/31/15 1650    Education provided Yes   Education Details HEP, prognosis, POC   Person(s) Educated Patient   Methods Explanation;Handout   Comprehension Verbalized understanding;Returned demonstration          PT Short Term Goals - 03/31/15 1654    PT SHORT TERM GOAL #1   Title Pt will be independent with HEP.   Time 1.5   Period Weeks   Status New   PT SHORT TERM GOAL #2   Title Improve R knee flexion AROM to 110 to improve ability to ascend/descend stairs.    Time 1.5   Period Weeks   Status New   PT SHORT TERM GOAL #3   Title Increase R knee extension ROM to -5 or greater to improve gait mechanics.    Time 1.5   Period Weeks   Status New   PT SHORT TERM GOAL #4   Title Improve RLE strength to 4-/5 or greater to improve functional mobility.   Time 1.5   Period Weeks   Status New           PT Long Term Goals - 03/31/15 1656    PT LONG TERM GOAL #1   Title Pt will be independent with advanced HEP.   Time 3   Period  Weeks   Status New   PT LONG TERM GOAL #2   Title Improve R knee ROM to 0-120 to normalize gait mechanics and improve ability to  navigate stairs.    Time 3   Period Weeks   Status New   PT LONG TERM GOAL #3   Title Pt will complete squats to 60 degrees or more while lifting 10# box with proper form to allow her to [employer] at work.    Time 3   Period Weeks   Status New   PT LONG TERM GOAL #4   Title Pt will ambulate 1,000 feet or more during 6 minute walk test to return her to PLOF of community ambulation.    Time 3   Period Weeks   Status New               Plan - 03/31/15 1651    Clinical Impression Statement Pt presents to PT following R knee arthroscopy. Pt demonstrates decreased strength of RLE, decreased ROM of R knee flexion and extension, increased edema of R knee, altered gait mechanics, decreased functional activity tolerance, and impaired balance. 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    Pt will benefit from skilled therapeutic intervention in order to improve on the following deficits Abnormal gait;Decreased activity tolerance;Decreased balance;Decreased endurance;Decreased strength;Decreased range of motion;Difficulty walking;Hypomobility;Pain   Rehab Potential Good   PT Frequency 3x / week   PT Duration 3 weeks   PT Treatment/Interventions ADLs/Self Care Home Management;Cryotherapy;Electrical Stimulation;Gait training;Stair training;Functional mobility training;Therapeutic activities;Therapeutic exercise;Balance training;Neuromuscular re-education;Patient/family education;Manual techniques   PT Next Visit Plan Review HEP, add SLR, hip abduction, heel raises, TKE         Problem List Patient Active Problem List   Diagnosis Date Noted  . Primary osteoarthritis of right knee   . Medial  meniscus, posterior horn derangement   . Dysesthesia 10/15/2014  . Gait disturbance 10/15/2014  . Essential hypertension, benign 05/27/2013  . Multiple sclerosis ([place]) 03/06/2013  . Hypertriglyceridemia 11/02/2012  . Hypokalemia 11/02/2012  . Depression with anxiety 11/02/2012  . DS (disseminated sclerosis) ([place]) 03/22/2011  . DIVERTICULITIS, COLON 08/11/2009  . IRRITABLE BOWEL SYNDROME 08/11/2009    Hilma Favors, PT, DPT [phone] 03/31/2015, 5:00 PM  [facility] [address] [place], [place], [address] Phone: [phone]   Fax:  [phone]  Name: Krystal Silva MRN: [id number] Date of Birth: 1955/06/26

## 2015-04-02 ENCOUNTER — Ambulatory Visit (HOSPITAL_COMMUNITY): Payer: BLUE CROSS/BLUE SHIELD | Admitting: Physical Therapy

## 2015-04-02 DIAGNOSIS — R262 Difficulty in walking, not elsewhere classified: Secondary | ICD-10-CM

## 2015-04-02 DIAGNOSIS — M25661 Stiffness of right knee, not elsewhere classified: Secondary | ICD-10-CM

## 2015-04-02 DIAGNOSIS — M25561 Pain in right knee: Secondary | ICD-10-CM | POA: Diagnosis not present

## 2015-04-02 DIAGNOSIS — R6889 Other general symptoms and signs: Secondary | ICD-10-CM

## 2015-04-02 DIAGNOSIS — R29898 Other symptoms and signs involving the musculoskeletal system: Secondary | ICD-10-CM

## 2015-04-02 NOTE — Therapy (Signed)
[place], [place], [address] Phone: [phone]   Fax:  [phone]  Physical Therapy Treatment  Patient Details  Name: CAYLYN RESTER MRN: [id number] Date of Birth: Feb 06, 1956 Referring Provider: Arther Abbott MD  Encounter Date: 04/02/2015      PT End of Session - 04/02/15 1527    Visit Number 2   Number of Visits 10   Date for PT Re-Evaluation 04/30/15   Authorization Type BCBS   Authorization Time Period 03/31/15-05/31/15   PT Start Time 1346   PT Stop Time 1426   PT Time Calculation (min) 40 min   Activity Tolerance Patient tolerated treatment well;Patient limited by pain   Behavior During Therapy [facility] for tasks assessed/performed      Past Medical History  Diagnosis Date  . Hypertension   . MS (multiple sclerosis) ([place])   . Diverticulitis   . Allergy   . Insomnia   . Vitamin D deficiency   . Hypercholesteremia     Past Surgical History  Procedure Laterality Date  . Tonsillectomy    . Wisdom tooth extraction  1980's  . Colonoscopy  08/21/2009  . Knee arthroscopy with medial menisectomy Right 03/27/2015    Procedure: KNEE ARTHROSCOPY WITH PARTIAL MEDIAL MENISECTOMY AND MICROFRACTURE;  Surgeon: Carole Civil, MD;  Location: [facility];  Service: Orthopedics;  Laterality: Right;    There were no vitals filed for this visit.  Visit Diagnosis:  Right knee pain  Stiffness of right knee  Difficulty walking  Decreased functional activity tolerance  Weakness of right leg      Subjective Assessment - 04/02/15 1350    Subjective Patient reports that she is having quite a bit of pain today, and that it has been a rough day so far    Currently in Pain? Yes   Pain Score 7    Pain Location Knee   Pain Orientation Right                         [facility] Adult PT Treatment/Exercise - 04/02/15 0001    Knee/Hip Exercises: Stretches   Active Hamstring Stretch Both;3 reps;30 seconds    Active Hamstring Stretch Limitations supine with rope    Quad Stretch Both;3 reps;30 seconds   Quad Stretch Limitations prone with rope    Gastroc Stretch Both;3 reps;30 seconds   Gastroc Stretch Limitations slantboard    Knee/Hip Exercises: Standing   Heel Raises Both;1 set;10 reps   Heel Raises Limitations toe and heel raises    Forward Lunges Both;1 set;10 reps   Forward Lunges Limitations 8 inch box    Forward Step Up Both;1 set;10 reps   Forward Step Up Limitations 4 inch box    Rocker Board Limitations x20 AP and lateral, B HHA    Other Standing Knee Exercises 3D hip excursions 1x10   Knee/Hip Exercises: Supine   Bridges Both;1 set;10 reps   Straight Leg Raises Both;1 set;10 reps   Knee/Hip Exercises: Sidelying   Clams 1x10 each side    Manual Therapy   Manual Therapy Edema management   Edema Management retrograde massage to reduce edema                 PT Education - 04/02/15 1527    Education provided Yes   Education Details reviewed HEP and goals    Person(s) Educated Patient   Methods Explanation   Comprehension Verbalized understanding  PT Short Term Goals - 03/31/15 1654    PT SHORT TERM GOAL #1   Title Pt will be independent with HEP.   Time 1.5   Period Weeks   Status New   PT SHORT TERM GOAL #2   Title Improve R knee flexion AROM to 110 to improve ability to ascend/descend stairs.    Time 1.5   Period Weeks   Status New   PT SHORT TERM GOAL #3   Title Increase R knee extension ROM to -5 or greater to improve gait mechanics.    Time 1.5   Period Weeks   Status New   PT SHORT TERM GOAL #4   Title Improve RLE strength to 4-/5 or greater to improve functional mobility.   Time 1.5   Period Weeks   Status New           PT Long Term Goals - 03/31/15 1656    PT LONG TERM GOAL #1   Title Pt will be independent with advanced HEP.   Time 3   Period Weeks   Status New   PT LONG TERM GOAL #2   Title Improve R knee ROM to 0-120  to normalize gait mechanics and improve ability to navigate stairs.    Time 3   Period Weeks   Status New   PT LONG TERM GOAL #3   Title Pt will complete squats to 60 degrees or more while lifting 10# box with proper form to allow her to [employer] at work.    Time 3   Period Weeks   Status New   PT LONG TERM GOAL #4   Title Pt will ambulate 1,000 feet or more during 6 minute walk test to return her to PLOF of community ambulation.    Time 3   Period Weeks   Status New               Plan - 04/02/15 1528    Clinical Impression Statement Patient fairly pain limited today but did particiapte well in today's session. Did require occasional cues for exercise form and contniues to demonstrate difficulty walking with and without assistive device. Reviewed initial eval and goals. Responded welll to retrograde massage  today to eliminate edema and reduce pain. Pain reduced to 5/10 at end of session.    Pt will benefit from skilled therapeutic intervention in order to improve on the following deficits Abnormal gait;Decreased activity tolerance;Decreased balance;Decreased endurance;Decreased strength;Decreased range of motion;Difficulty walking;Hypomobility;Pain   Rehab Potential Good   PT Frequency 3x / week   PT Duration 3 weeks   PT Treatment/Interventions ADLs/Self Care Home Management;Cryotherapy;Electrical Stimulation;Gait training;Stair training;Functional mobility training;Therapeutic activities;Therapeutic exercise;Balance training;Neuromuscular re-education;Patient/family education;Manual techniques   PT Next Visit Plan functional stretching and strengthening, gait and balance traininng    Consulted and Agree with Plan of Care Patient        Problem List Patient Active Problem List   Diagnosis Date Noted  . Primary osteoarthritis of right knee   . Medial meniscus, posterior horn derangement   . Dysesthesia 10/15/2014  . Gait disturbance 10/15/2014  . Essential  hypertension, benign 05/27/2013  . Multiple sclerosis ([place]) 03/06/2013  . Hypertriglyceridemia 11/02/2012  . Hypokalemia 11/02/2012  . Depression with anxiety 11/02/2012  . DS (disseminated sclerosis) ([place]) 03/22/2011  . DIVERTICULITIS, COLON 08/11/2009  . IRRITABLE BOWEL SYNDROME 08/11/2009    Deniece Ree PT, DPT [place] [address] [place], [place], [address]  Phone: [phone]   Fax:  [phone]  Name: SHAKEELAH BARRITT MRN: [id number] Date of Birth: 1955-11-20

## 2015-04-06 ENCOUNTER — Ambulatory Visit (HOSPITAL_COMMUNITY): Payer: BLUE CROSS/BLUE SHIELD | Admitting: Physical Therapy

## 2015-04-06 ENCOUNTER — Other Ambulatory Visit: Payer: Self-pay

## 2015-04-06 DIAGNOSIS — R29898 Other symptoms and signs involving the musculoskeletal system: Secondary | ICD-10-CM

## 2015-04-06 DIAGNOSIS — R262 Difficulty in walking, not elsewhere classified: Secondary | ICD-10-CM

## 2015-04-06 DIAGNOSIS — R6889 Other general symptoms and signs: Secondary | ICD-10-CM

## 2015-04-06 DIAGNOSIS — M25561 Pain in right knee: Secondary | ICD-10-CM

## 2015-04-06 DIAGNOSIS — M25661 Stiffness of right knee, not elsewhere classified: Secondary | ICD-10-CM

## 2015-04-06 MED ORDER — INTERFERON BETA-1A 30 MCG/0.5ML IM AJKT: 30.0000 ug | AUTO-INJECTOR | INTRAMUSCULAR | Status: DC

## 2015-04-06 NOTE — Therapy (Signed)
[place], [place], [address] Phone: [phone]   Fax:  [phone]  Physical Therapy Treatment  Patient Details  Name: Krystal Silva MRN: [id number] Date of Birth: 12-16-1955 Referring Provider: Arther Abbott MD  Encounter Date: 04/06/2015      PT End of Session - 04/06/15 0951    Visit Number 3   Number of Visits 10   Date for PT Re-Evaluation 04/30/15   Authorization Type BCBS   Authorization Time Period 03/31/15-05/31/15   PT Start Time 0853   PT Stop Time 0940   PT Time Calculation (min) 47 min   Activity Tolerance Patient tolerated treatment well;Patient limited by pain   Behavior During Therapy [facility] for tasks assessed/performed      Past Medical History  Diagnosis Date  . Hypertension   . MS (multiple sclerosis) ([place])   . Diverticulitis   . Allergy   . Insomnia   . Vitamin D deficiency   . Hypercholesteremia     Past Surgical History  Procedure Laterality Date  . Tonsillectomy    . Wisdom tooth extraction  1980's  . Colonoscopy  08/21/2009  . Knee arthroscopy with medial menisectomy Right 03/27/2015    Procedure: KNEE ARTHROSCOPY WITH PARTIAL MEDIAL MENISECTOMY AND MICROFRACTURE;  Surgeon: Carole Civil, MD;  Location: [facility];  Service: Orthopedics;  Laterality: Right;    There were no vitals filed for this visit.  Visit Diagnosis:  Right knee pain  Stiffness of right knee  Difficulty walking  Decreased functional activity tolerance  Weakness of right leg      Subjective Assessment - 04/06/15 0859    Subjective Pt states pain of 6/10 today.  States she has been doing her HEP and icing at home.   States she drove today for the first time.    Currently in Pain? Yes   Pain Score 6    Pain Location Knee   Pain Orientation Right                         [facility] Adult PT Treatment/Exercise - 04/06/15 0900    Knee/Hip Exercises: Stretches   Active Hamstring  Stretch Both;3 reps;30 seconds   Active Hamstring Stretch Limitations standing 12"    Gastroc Stretch Both;3 reps;30 seconds   Gastroc Stretch Limitations slantboard    Knee/Hip Exercises: Standing   Heel Raises Both;1 set;10 reps   Heel Raises Limitations toe and heel raises    Forward Lunges Both;1 set;10 reps   Forward Lunges Limitations 6 iinch box    Lateral Step Up Right;10 reps;Step Height: 4";Hand Hold: 1   Lateral Step Up Limitations 4" step   Forward Step Up Right;10 reps;Step Height: 4";Hand Hold: 1   Forward Step Up Limitations 4" step   Manual Therapy   Manual Therapy Edema management   Edema Management retrograde massage to reduce edema                   PT Short Term Goals - 03/31/15 1654    PT SHORT TERM GOAL #1   Title Pt will be independent with HEP.   Time 1.5   Period Weeks   Status New   PT SHORT TERM GOAL #2   Title Improve R knee flexion AROM to 110 to improve ability to ascend/descend stairs.    Time 1.5   Period Weeks   Status New   PT SHORT TERM GOAL #3  Title Increase R knee extension ROM to -5 or greater to improve gait mechanics.    Time 1.5   Period Weeks   Status New   PT SHORT TERM GOAL #4   Title Improve RLE strength to 4-/5 or greater to improve functional mobility.   Time 1.5   Period Weeks   Status New           PT Long Term Goals - 03/31/15 1656    PT LONG TERM GOAL #1   Title Pt will be independent with advanced HEP.   Time 3   Period Weeks   Status New   PT LONG TERM GOAL #2   Title Improve R knee ROM to 0-120 to normalize gait mechanics and improve ability to navigate stairs.    Time 3   Period Weeks   Status New   PT LONG TERM GOAL #3   Title Pt will complete squats to 60 degrees or more while lifting 10# box with proper form to allow her to [employer] at work.    Time 3   Period Weeks   Status New   PT LONG TERM GOAL #4   Title Pt will ambulate 1,000 feet or more during 6 minute walk test to return  her to PLOF of community ambulation.    Time 3   Period Weeks   Status New               Plan - 04/06/15 [id number]    Clinical Impression Statement Completed therex today with abiltiy to complete hamstring stretch in standing noting increased ROM.  Decreased lunge to 6" height and added lateral step ups without difficulty.  PT requires cues from therapist for form and holds.    PT Next Visit Plan functional stretching and strengthening, gait and balance training         Problem List Patient Active Problem List   Diagnosis Date Noted  . Primary osteoarthritis of right knee   . Medial meniscus, posterior horn derangement   . Dysesthesia 10/15/2014  . Gait disturbance 10/15/2014  . Essential hypertension, benign 05/27/2013  . Multiple sclerosis ([place]) 03/06/2013  . Hypertriglyceridemia 11/02/2012  . Hypokalemia 11/02/2012  . Depression with anxiety 11/02/2012  . DS (disseminated sclerosis) ([place]) 03/22/2011  . DIVERTICULITIS, COLON 08/11/2009  . IRRITABLE BOWEL SYNDROME 08/11/2009    Teena Irani, PTA/CLT [phone]  04/06/2015, 9:58 AM  [facility] [address] [place], [place], [address] Phone: [phone]   Fax:  [phone]  Name: KATORA FADDIS MRN: [id number] Date of Birth: 06/14/1955

## 2015-04-08 ENCOUNTER — Ambulatory Visit (HOSPITAL_COMMUNITY): Payer: BLUE CROSS/BLUE SHIELD

## 2015-04-08 DIAGNOSIS — M25561 Pain in right knee: Secondary | ICD-10-CM

## 2015-04-08 DIAGNOSIS — R29898 Other symptoms and signs involving the musculoskeletal system: Secondary | ICD-10-CM

## 2015-04-08 DIAGNOSIS — M25661 Stiffness of right knee, not elsewhere classified: Secondary | ICD-10-CM

## 2015-04-08 DIAGNOSIS — R262 Difficulty in walking, not elsewhere classified: Secondary | ICD-10-CM

## 2015-04-08 DIAGNOSIS — R6889 Other general symptoms and signs: Secondary | ICD-10-CM

## 2015-04-08 NOTE — Therapy (Signed)
[place], [place], [address] Phone: [phone]   Fax:  [phone]  Physical Therapy Treatment  Patient Details  Name: Krystal Silva MRN: [id number] Date of Birth: 1956/04/13 Referring Provider: Arther Abbott MD  Encounter Date: 04/08/2015      PT End of Session - 04/08/15 1355    Visit Number 4   Number of Visits 10   Date for PT Re-Evaluation 04/30/15   Authorization Type BCBS   Authorization Time Period 03/31/15-05/31/15   PT Start Time 1301   PT Stop Time 1350   PT Time Calculation (min) 49 min   Activity Tolerance Patient tolerated treatment well   Behavior During Therapy [facility] for tasks assessed/performed      Past Medical History  Diagnosis Date  . Hypertension   . MS (multiple sclerosis) ([place])   . Diverticulitis   . Allergy   . Insomnia   . Vitamin D deficiency   . Hypercholesteremia     Past Surgical History  Procedure Laterality Date  . Tonsillectomy    . Wisdom tooth extraction  1980's  . Colonoscopy  08/21/2009  . Knee arthroscopy with medial menisectomy Right 03/27/2015    Procedure: KNEE ARTHROSCOPY WITH PARTIAL MEDIAL MENISECTOMY AND MICROFRACTURE;  Surgeon: Carole Civil, MD;  Location: [facility];  Service: Orthopedics;  Laterality: Right;    There were no vitals filed for this visit.  Visit Diagnosis:  Right knee pain  Stiffness of right knee  Difficulty walking  Decreased functional activity tolerance  Weakness of right leg      Subjective Assessment - 04/08/15 1304    Subjective Pt forgot cane at home today, reports knee is feeling better today.  Pt reports compliance with HEP daily.  Stated knee is swollen today and has difficulty with stairs at home.   Currently in Pain? Yes   Pain Score 4    Pain Location Knee   Pain Descriptors / Indicators Sore;Tightness           [facility] Adult PT Treatment/Exercise - 04/08/15 0001    Exercises   Exercises Knee/Hip   Knee/Hip Exercises: Stretches   Active Hamstring Stretch Both;3 reps;30 seconds   Active Hamstring Stretch Limitations standing 12"    Quad Stretch Both;3 reps;30 seconds   Quad Stretch Limitations prone with rope    Knee: Self-Stretch Limitations knee drives on 8in step [id number] 10"   Gastroc Stretch Both;3 reps;30 seconds   Gastroc Stretch Limitations slantboard    Knee/Hip Exercises: Standing   Forward Lunges Both;1 set;10 reps   Forward Lunges Limitations 6 inch box    Terminal Knee Extension 10 reps;Theraband   Theraband Level (Terminal Knee Extension) Level 2 (Red)   Terminal Knee Extension Limitations 5" holds   Lateral Step Up Right;10 reps;Step Height: 4";Hand Hold: 1   Forward Step Up Right;Step Height: 4";Hand Hold: 1;15 reps   Forward Step Up Limitations 4" step   Other Standing Knee Exercises 3D hip excursions 1x10   Knee/Hip Exercises: Supine   Quad Sets Right;10 reps   Short Arc Quad Sets 15 reps   Heel Slides Right;10 reps   Terminal Knee Extension 10 reps   Patellar Mobs all directions   Manual Therapy   Manual Therapy Edema management;Joint mobilization   Edema Management retrograde massage to reduce edema    Joint Mobilization patella mobs all directions            PT Short Term Goals - 03/31/15  [place] #1   Title Pt will be independent with HEP.   Time 1.5   Period Weeks   Status New   PT SHORT TERM GOAL #2   Title Improve R knee flexion AROM to 110 to improve ability to ascend/descend stairs.    Time 1.5   Period Weeks   Status New   PT SHORT TERM GOAL #3   Title Increase R knee extension ROM to -5 or greater to improve gait mechanics.    Time 1.5   Period Weeks   Status New   PT SHORT TERM GOAL #4   Title Improve RLE strength to 4-/5 or greater to improve functional mobility.   Time 1.5   Period Weeks   Status New           PT Long Term Goals - 03/31/15 1656    PT LONG TERM GOAL #1   Title Pt will be independent with  advanced HEP.   Time 3   Period Weeks   Status New   PT LONG TERM GOAL #2   Title Improve R knee ROM to 0-120 to normalize gait mechanics and improve ability to navigate stairs.    Time 3   Period Weeks   Status New   PT LONG TERM GOAL #3   Title Pt will complete squats to 60 degrees or more while lifting 10# box with proper form to allow her to [employer] at work.    Time 3   Period Weeks   Status New   PT LONG TERM GOAL #4   Title Pt will ambulate 1,000 feet or more during 6 minute walk test to return her to PLOF of community ambulation.    Time 3   Period Weeks   Status New               Plan - 04/08/15 1355    Clinical Impression Statement Session focus on improving ROM and functional strengthening.  Continued with stretches and strengthening exercises to improve ROM and function.  Pt c/o knee popping with flexion.  Pt instructed patella mobs to improve patella tracking with noted adhesions with superior/inferior and educated on importance of improving quad strengthening to assist with knee popping and knee extension.  AROM is improving following manual for edema control. 6-120 degrees   Pt will benefit from skilled therapeutic intervention in order to improve on the following deficits Abnormal gait;Decreased activity tolerance;Decreased balance;Decreased endurance;Decreased strength;Decreased range of motion;Difficulty walking;Hypomobility;Pain   PT Treatment/Interventions ADLs/Self Care Home Management;Cryotherapy;Electrical Stimulation;Gait training;Stair training;Functional mobility training;Therapeutic activities;Therapeutic exercise;Balance training;Neuromuscular re-education;Patient/family education;Manual techniques   PT Next Visit Plan Continue current PT POC to improve knee extension, functional stretching and strengthening, gait with no AD and balance training.          Problem List Patient Active Problem List   Diagnosis Date Noted  . Primary  osteoarthritis of right knee   . Medial meniscus, posterior horn derangement   . Dysesthesia 10/15/2014  . Gait disturbance 10/15/2014  . Essential hypertension, benign 05/27/2013  . Multiple sclerosis ([place]) 03/06/2013  . Hypertriglyceridemia 11/02/2012  . Hypokalemia 11/02/2012  . Depression with anxiety 11/02/2012  . DS (disseminated sclerosis) ([place]) 03/22/2011  . DIVERTICULITIS, COLON 08/11/2009  . IRRITABLE BOWEL SYNDROME 08/11/2009  Ihor Austin, LPTA; [place]   Aldona Lento 04/08/2015, 2:27 PM  [facility] [address] [place], [place], [address] Phone: [phone]   Fax:  [phone]  Name: Krystal Silva MRN: [id number] Date of Birth: 1956-03-20

## 2015-04-09 ENCOUNTER — Other Ambulatory Visit: Payer: Self-pay | Admitting: Family Medicine

## 2015-04-10 ENCOUNTER — Ambulatory Visit (HOSPITAL_COMMUNITY): Payer: BLUE CROSS/BLUE SHIELD

## 2015-04-10 DIAGNOSIS — R262 Difficulty in walking, not elsewhere classified: Secondary | ICD-10-CM

## 2015-04-10 DIAGNOSIS — M25561 Pain in right knee: Secondary | ICD-10-CM | POA: Diagnosis not present

## 2015-04-10 DIAGNOSIS — M25661 Stiffness of right knee, not elsewhere classified: Secondary | ICD-10-CM

## 2015-04-10 DIAGNOSIS — R29898 Other symptoms and signs involving the musculoskeletal system: Secondary | ICD-10-CM

## 2015-04-10 DIAGNOSIS — R6889 Other general symptoms and signs: Secondary | ICD-10-CM

## 2015-04-10 NOTE — Therapy (Signed)
[place], [place], [address] Phone: [phone]   Fax:  [phone]  Physical Therapy Treatment  Patient Details  Name: Krystal Silva MRN: [id number] Date of Birth: [birth year] Referring Provider: Arther Abbott MD  Encounter Date: 04/10/2015      PT End of Session - 04/10/15 1309    Visit Number 5   Number of Visits 10   Date for PT Re-Evaluation 04/30/15   Authorization Type BCBS   Authorization Time Period 03/31/15-05/31/15   PT Start Time 1304   PT Stop Time 1350   PT Time Calculation (min) 46 min   Activity Tolerance Patient tolerated treatment well   Behavior During Therapy [facility] for tasks assessed/performed      Past Medical History  Diagnosis Date  . Hypertension   . MS (multiple sclerosis) ([place])   . Diverticulitis   . Allergy   . Insomnia   . Vitamin D deficiency   . Hypercholesteremia     Past Surgical History  Procedure Laterality Date  . Tonsillectomy    . Wisdom tooth extraction  1980's  . Colonoscopy  08/21/2009  . Knee arthroscopy with medial menisectomy Right 03/27/2015    Procedure: KNEE ARTHROSCOPY WITH PARTIAL MEDIAL MENISECTOMY AND MICROFRACTURE;  Surgeon: Carole Civil, MD;  Location: [facility];  Service: Orthopedics;  Laterality: Right;    There were no vitals filed for this visit.  Visit Diagnosis:  Right knee pain  Stiffness of right knee  Difficulty walking  Decreased functional activity tolerance  Weakness of right leg      Subjective Assessment - 04/10/15 1307    Subjective Pt stated she over did it yesterday, drove and walked for increased periods of time with increased pain following.     Currently in Pain? Yes   Pain Score 5    Pain Location Knee   Pain Orientation Right   Pain Descriptors / Indicators Aching;Sore;Tightness           [facility] Adult PT Treatment/Exercise - 04/10/15 0001    Exercises   Exercises Knee/Hip   Knee/Hip Exercises:  Stretches   Active Hamstring Stretch Both;3 reps;30 seconds   Active Hamstring Stretch Limitations standing 12"    Knee: Self-Stretch Limitations knee drives on 8in step [id number] 10"   Gastroc Stretch Both;3 reps;30 seconds   Gastroc Stretch Limitations slantboard    Knee/Hip Exercises: Standing   Heel Raises Both;15 reps   Heel Raises Limitations toe and heel raises    Terminal Knee Extension 15 reps   Theraband Level (Terminal Knee Extension) Level 2 (Red)   Terminal Knee Extension Limitations 5" holds   Lateral Step Up 15 reps;Hand Hold: 2;Right;Step Height: 4"   Forward Step Up Right;15 reps;Hand Hold: 1;Step Height: 4"   Functional Squat 15 reps   Functional Squat Limitations min cueing   Knee/Hip Exercises: Supine   Quad Sets Right;10 reps   Short Arc Quad Sets 15 reps   Terminal Knee Extension 10 reps   Patellar Mobs all directions   Manual Therapy   Manual Therapy Edema management;Joint mobilization   Edema Management Retro massage with LE elevated   Joint Mobilization patella mobs all directions                  PT Short Term Goals - 04/10/15 1849    PT SHORT TERM GOAL #1   Title Pt will be independent with HEP.   Status On-going   PT SHORT TERM  GOAL #2   Title Improve R knee flexion AROM to 110 to improve ability to ascend/descend stairs.    Status Achieved   PT SHORT TERM GOAL #3   Title Increase R knee extension ROM to -5 or greater to improve gait mechanics.    Status Achieved   PT SHORT TERM GOAL #4   Title Improve RLE strength to 4-/5 or greater to improve functional mobility.   Status On-going           PT Long Term Goals - 03/31/15 1656    PT LONG TERM GOAL #1   Title Pt will be independent with advanced HEP.   Time 3   Period Weeks   Status New   PT LONG TERM GOAL #2   Title Improve R knee ROM to 0-120 to normalize gait mechanics and improve ability to navigate stairs.    Time 3   Period Weeks   Status New   PT LONG TERM GOAL #3    Title Pt will complete squats to 60 degrees or more while lifting 10# box with proper form to allow her to [employer] at work.    Time 3   Period Weeks   Status New   PT LONG TERM GOAL #4   Title Pt will ambulate 1,000 feet or more during 6 minute walk test to return her to PLOF of community ambulation.    Time 3   Period Weeks   Status New               Plan - 04/10/15 1350    Clinical Impression Statement Continued session focus on improving AROM, functional strengthening and gait training with no AD.  Pt limited by pain this session initially, pt demonstrated incresed antalgic gait mechanics without AD, min cueing to increase stance phase with improve gait mechanics noted.  Therex focus on functional stretches and strengthening exercises to improve ROM.  Completed manual retro massage and patella mobs for edema control prior quad strengthening exercises.  Noted improved patella mobility.  Pt improving quad contraction activation with AROM at 4-122 degrees at end of session.  Pt reports pain reduced at end of session with improved gait mechanics.     PT Next Visit Plan Continue current PT POC to improve knee extension, functional stretching and strengthening, gait with no AD and balance training.          Problem List Patient Active Problem List   Diagnosis Date Noted  . Primary osteoarthritis of right knee   . Medial meniscus, posterior horn derangement   . Dysesthesia 10/15/2014  . Gait disturbance 10/15/2014  . Essential hypertension, benign 05/27/2013  . Multiple sclerosis ([place]) 03/06/2013  . Hypertriglyceridemia 11/02/2012  . Hypokalemia 11/02/2012  . Depression with anxiety 11/02/2012  . DS (disseminated sclerosis) ([place]) 03/22/2011  . DIVERTICULITIS, COLON 08/11/2009  . IRRITABLE BOWEL SYNDROME 08/11/2009   Ihor Austin, LPTA; [place]  Aldona Lento 04/10/2015, 6:49 PM  [place] [address] [place], [place], [address] Phone: [phone]   Fax:  [phone]  Name: MONIQUA WELTZIN MRN: [id number] Date of Birth: 1956-03-14

## 2015-04-13 ENCOUNTER — Ambulatory Visit (HOSPITAL_COMMUNITY): Payer: BLUE CROSS/BLUE SHIELD | Admitting: Physical Therapy

## 2015-04-13 DIAGNOSIS — R29898 Other symptoms and signs involving the musculoskeletal system: Secondary | ICD-10-CM

## 2015-04-13 DIAGNOSIS — R6889 Other general symptoms and signs: Secondary | ICD-10-CM

## 2015-04-13 DIAGNOSIS — R262 Difficulty in walking, not elsewhere classified: Secondary | ICD-10-CM

## 2015-04-13 DIAGNOSIS — M25661 Stiffness of right knee, not elsewhere classified: Secondary | ICD-10-CM

## 2015-04-13 DIAGNOSIS — M25561 Pain in right knee: Secondary | ICD-10-CM | POA: Diagnosis not present

## 2015-04-13 NOTE — Therapy (Signed)
[facility] [place], [place], [address] Phone: [phone]   Fax:  [phone]  Physical Therapy Treatment  Patient Details  Name: Krystal Silva MRN: [id number] Date of Birth: [birth year] Referring Provider: Arther Abbott MD  Encounter Date: 04/13/2015      PT End of Session - 04/13/15 1604    Visit Number 6   Number of Visits 10   Date for PT Re-Evaluation 04/30/15   Authorization Type BCBS   Authorization Time Period 03/31/15-05/31/15   PT Start Time 1350   PT Stop Time 1436   PT Time Calculation (min) 46 min   Activity Tolerance Patient tolerated treatment well   Behavior During Therapy [facility] for tasks assessed/performed      Past Medical History  Diagnosis Date  . Hypertension   . MS (multiple sclerosis) ([place])   . Diverticulitis   . Allergy   . Insomnia   . Vitamin D deficiency   . Hypercholesteremia     Past Surgical History  Procedure Laterality Date  . Tonsillectomy    . Wisdom tooth extraction  1980's  . Colonoscopy  08/21/2009  . Knee arthroscopy with medial menisectomy Right 03/27/2015    Procedure: KNEE ARTHROSCOPY WITH PARTIAL MEDIAL MENISECTOMY AND MICROFRACTURE;  Surgeon: Carole Civil, MD;  Location: [facility];  Service: Orthopedics;  Laterality: Right;    There were no vitals filed for this visit.  Visit Diagnosis:  Right knee pain  Stiffness of right knee  Difficulty walking  Decreased functional activity tolerance  Weakness of right leg      Subjective Assessment - 04/13/15 1603    Subjective Pt states she is doing well today without c/o pain.   Currently in Pain? No/denies                         [facility] [address] Adult PT Treatment/Exercise - 04/13/15 1416    Knee/Hip Exercises: Stretches   Active Hamstring Stretch Both;3 reps;30 seconds   Active Hamstring Stretch Limitations standing 12"    Gastroc Stretch Both;3 reps;30 seconds   Gastroc Stretch Limitations  slantboard    Knee/Hip Exercises: Standing   Heel Raises Both;10 reps   Heel Raises Limitations with squats   Forward Lunges Both;1 set;10 reps   Forward Lunges Limitations 4 inch box    Lateral Step Up 15 reps;Hand Hold: 2;Right;Step Height: 4"   Forward Step Up Right;15 reps;Hand Hold: 1;Step Height: 4"   Step Down Both;10 reps;Step Height: 4";Hand Hold: 1   Manual Therapy   Manual Therapy Edema management;Joint mobilization   Edema Management Retro massage with LE elevated   Joint Mobilization patella mobs all directions                  PT Short Term Goals - 04/10/15 1849    PT SHORT TERM GOAL #1   Title Pt will be independent with HEP.   Status On-going   PT SHORT TERM GOAL #2   Title Improve R knee flexion AROM to 110 to improve ability to ascend/descend stairs.    Status Achieved   PT SHORT TERM GOAL #3   Title Increase R knee extension ROM to -5 or greater to improve gait mechanics.    Status Achieved   PT SHORT TERM GOAL #4   Title Improve RLE strength to 4-/5 or greater to improve functional mobility.   Status On-going  PT Long Term Goals - 03/31/15 1656    PT LONG TERM GOAL #1   Title Pt will be independent with advanced HEP.   Time 3   Period Weeks   Status New   PT LONG TERM GOAL #2   Title Improve R knee ROM to 0-120 to normalize gait mechanics and improve ability to navigate stairs.    Time 3   Period Weeks   Status New   PT LONG TERM GOAL #3   Title Pt will complete squats to 60 degrees or more while lifting 10# box with proper form to allow her to [employer] at work.    Time 3   Period Weeks   Status New   PT LONG TERM GOAL #4   Title Pt will ambulate 1,000 feet or more during 6 minute walk test to return her to PLOF of community ambulation.    Time 3   Period Weeks   Status New               Plan - 04/13/15 1604    Clinical Impression Statement Continued to progress functional strength adding forward step  downs today. Pt able to complete with verbal cues to complete slower controlled motion.  continued with manual retro massage as presents wtih ongoing induration perimeter of Rt knee. Pt cointinues to improve with ROM and decreasing pain.  No AD used today .   PT Next Visit Plan Continue current PT POC to improve knee extension, functional stretching and strengthening.  Begin balance training next session.          Problem List Patient Active Problem List   Diagnosis Date Noted  . Primary osteoarthritis of right knee   . Medial meniscus, posterior horn derangement   . Dysesthesia 10/15/2014  . Gait disturbance 10/15/2014  . Essential hypertension, benign 05/27/2013  . Multiple sclerosis ([place]) 03/06/2013  . Hypertriglyceridemia 11/02/2012  . Hypokalemia 11/02/2012  . Depression with anxiety 11/02/2012  . DS (disseminated sclerosis) ([place]) 03/22/2011  . DIVERTICULITIS, COLON 08/11/2009  . IRRITABLE BOWEL SYNDROME 08/11/2009    Teena Irani, PTA/CLT [phone] 04/13/2015, 4:07 PM  [facility] [address] [place], [place], [address] Phone: [phone]   Fax:  [phone]  Name: Krystal Silva MRN: [id number] Date of Birth: 29-Jul-1955

## 2015-04-15 ENCOUNTER — Ambulatory Visit (HOSPITAL_COMMUNITY): Payer: BLUE CROSS/BLUE SHIELD

## 2015-04-15 DIAGNOSIS — M25561 Pain in right knee: Secondary | ICD-10-CM | POA: Diagnosis not present

## 2015-04-15 DIAGNOSIS — R262 Difficulty in walking, not elsewhere classified: Secondary | ICD-10-CM

## 2015-04-15 DIAGNOSIS — R29898 Other symptoms and signs involving the musculoskeletal system: Secondary | ICD-10-CM

## 2015-04-15 DIAGNOSIS — R6889 Other general symptoms and signs: Secondary | ICD-10-CM

## 2015-04-15 DIAGNOSIS — M25661 Stiffness of right knee, not elsewhere classified: Secondary | ICD-10-CM

## 2015-04-15 NOTE — Therapy (Signed)
[facility] [place], [place], [address] Phone: [phone]   Fax:  [phone]  Physical Therapy Treatment  Patient Details  Name: Krystal Silva MRN: [id number] Date of Birth: [birth year] Referring Provider: Arther Abbott MD  Encounter Date: 04/15/2015      PT End of Session - 04/15/15 1121    Visit Number 7   Number of Visits 10   Date for PT Re-Evaluation 04/30/15   Authorization Type BCBS   Authorization Time Period 03/31/15-05/31/15   PT Start Time 1104   PT Stop Time 1155   PT Time Calculation (min) 51 min   Activity Tolerance Patient tolerated treatment well;No increased pain   Behavior During Therapy [facility] for tasks assessed/performed      Past Medical History  Diagnosis Date  . Hypertension   . MS (multiple sclerosis) ([place])   . Diverticulitis   . Allergy   . Insomnia   . Vitamin D deficiency   . Hypercholesteremia     Past Surgical History  Procedure Laterality Date  . Tonsillectomy    . Wisdom tooth extraction  1980's  . Colonoscopy  08/21/2009  . Knee arthroscopy with medial menisectomy Right 03/27/2015    Procedure: KNEE ARTHROSCOPY WITH PARTIAL MEDIAL MENISECTOMY AND MICROFRACTURE;  Surgeon: Carole Civil, MD;  Location: [facility];  Service: Orthopedics;  Laterality: Right;    There were no vitals filed for this visit.  Visit Diagnosis:  Right knee pain  Stiffness of right knee  Difficulty walking  Decreased functional activity tolerance  Weakness of right leg      Subjective Assessment - 04/15/15 1107    Subjective Pt stated she has intermittent pain in center of knee, pain scale 5/10   Currently in Pain? Yes   Pain Score 5    Pain Location Knee   Pain Orientation Right   Pain Descriptors / Indicators [facility] Adult PT Treatment/Exercise - 04/15/15 0001    Knee/Hip Exercises: Stretches   Active Hamstring Stretch Both;3 reps;30 seconds   Active Hamstring  Stretch Limitations standing 12"    Gastroc Stretch Both;3 reps;30 seconds   Gastroc Stretch Limitations slantboard    Knee/Hip Exercises: Standing   Heel Raises Both;15 reps   Heel Raises Limitations with squats   Forward Lunges Both;1 set;10 reps   Forward Lunges Limitations 4 inch box    Terminal Knee Extension 15 reps   Theraband Level (Terminal Knee Extension) Level 2 (Red)   Terminal Knee Extension Limitations 5" holds   Lateral Step Up 15 reps;Hand Hold: 2;Right;Step Height: 4"   Forward Step Up Right;15 reps;Hand Hold: 1;Step Height: 6"   Step Down Right;15 reps;Hand Hold: 1;Step Height: 4"   Functional Squat 15 reps   Functional Squat Limitations squat then heel raises   SLS Rt 11", Lt 36" max of 3   Other Standing Knee Exercises tandem stance 3x 30"; tandem gait1RT; balance beam tandem gait 1RT   Other Standing Knee Exercises sidestepping 2RT with RTB   Knee/Hip Exercises: Supine   Patellar Mobs all directions   Manual Therapy   Manual Therapy Edema management;Joint mobilization   Edema Management Retro massage with LE elevated   Joint Mobilization patella mobs all directions                  PT Short Term Goals - 04/10/15 1849    PT SHORT TERM GOAL #1  Title Pt will be independent with HEP.   Status On-going   PT SHORT TERM GOAL #2   Title Improve R knee flexion AROM to 110 to improve ability to ascend/descend stairs.    Status Achieved   PT SHORT TERM GOAL #3   Title Increase R knee extension ROM to -5 or greater to improve gait mechanics.    Status Achieved   PT SHORT TERM GOAL #4   Title Improve RLE strength to 4-/5 or greater to improve functional mobility.   Status On-going           PT Long Term Goals - 03/31/15 1656    PT LONG TERM GOAL #1   Title Pt will be independent with advanced HEP.   Time 3   Period Weeks   Status New   PT LONG TERM GOAL #2   Title Improve R knee ROM to 0-120 to normalize gait mechanics and improve ability to  navigate stairs.    Time 3   Period Weeks   Status New   PT LONG TERM GOAL #3   Title Pt will complete squats to 60 degrees or more while lifting 10# box with proper form to allow her to [employer] at work.    Time 3   Period Weeks   Status New   PT LONG TERM GOAL #4   Title Pt will ambulate 1,000 feet or more during 6 minute walk test to return her to PLOF of community ambulation.    Time 3   Period Weeks   Status New               Plan - 04/15/15 1124    Clinical Impression Statement Session focus on improving functional strengthening and quad strengthening to improve knee extension.  Pt c/o knee popping during squats.  Patella mobs complete with reduction in popping and pain.  Pt able to complete stair training with no c/o knee popping following joint mobs.  Functional strengthening is progressing well, able to increase step height to 6in with forward step ups. Began balance training for knee stabilty with min assistance required, able to progress to dynamic surface with min A required.  Ended sessoin with manual techniques for swelling and pain control.  Improved AROM to 3-132 degrees at end of session.     PT Next Visit Plan Continue current PT POC to improve knee extension, functional stretching and strengthening.  Continue balance training.        Problem List Patient Active Problem List   Diagnosis Date Noted  . Primary osteoarthritis of right knee   . Medial meniscus, posterior horn derangement   . Dysesthesia 10/15/2014  . Gait disturbance 10/15/2014  . Essential hypertension, benign 05/27/2013  . Multiple sclerosis ([place]) 03/06/2013  . Hypertriglyceridemia 11/02/2012  . Hypokalemia 11/02/2012  . Depression with anxiety 11/02/2012  . DS (disseminated sclerosis) ([place]) 03/22/2011  . DIVERTICULITIS, COLON 08/11/2009  . IRRITABLE BOWEL SYNDROME 08/11/2009   Krystal Silva, LPTA; Krystal  Aldona Silva 04/15/2015, 12:10 PM  [facility] [place], [place], [address] Phone: [phone]   Fax:  [phone]  Name: Krystal Silva MRN: [id number] Date of Birth: 1955/11/06

## 2015-04-20 ENCOUNTER — Ambulatory Visit (HOSPITAL_COMMUNITY): Payer: BLUE CROSS/BLUE SHIELD | Admitting: Physical Therapy

## 2015-04-20 DIAGNOSIS — R6889 Other general symptoms and signs: Secondary | ICD-10-CM

## 2015-04-20 DIAGNOSIS — R262 Difficulty in walking, not elsewhere classified: Secondary | ICD-10-CM

## 2015-04-20 DIAGNOSIS — M25561 Pain in right knee: Secondary | ICD-10-CM

## 2015-04-20 DIAGNOSIS — M25661 Stiffness of right knee, not elsewhere classified: Secondary | ICD-10-CM

## 2015-04-20 DIAGNOSIS — R29898 Other symptoms and signs involving the musculoskeletal system: Secondary | ICD-10-CM

## 2015-04-20 NOTE — Therapy (Signed)
[facility] [place], [place], [address] Phone: [phone]   Fax:  [phone]  Physical Therapy Treatment  Patient Details  Name: Krystal Silva MRN: [id number] Date of Birth: Oct 19, 1955 Referring Provider: Arther Abbott MD  Encounter Date: 04/20/2015      PT End of Session - 04/20/15 1811    Visit Number 8   Number of Visits 10   Date for PT Re-Evaluation 04/30/15   Authorization Type BCBS   Authorization Time Period 03/31/15-05/31/15   PT Start Time 1438   PT Stop Time 1522   PT Time Calculation (min) 44 min   Activity Tolerance Patient tolerated treatment well;No increased pain   Behavior During Therapy [facility] for tasks assessed/performed      Past Medical History  Diagnosis Date  . Hypertension   . MS (multiple sclerosis) ([place])   . Diverticulitis   . Allergy   . Insomnia   . Vitamin D deficiency   . Hypercholesteremia     Past Surgical History  Procedure Laterality Date  . Tonsillectomy    . Wisdom tooth extraction  1980's  . Colonoscopy  08/21/2009  . Knee arthroscopy with medial menisectomy Right 03/27/2015    Procedure: KNEE ARTHROSCOPY WITH PARTIAL MEDIAL MENISECTOMY AND MICROFRACTURE;  Surgeon: Carole Civil, MD;  Location: [facility];  Service: Orthopedics;  Laterality: Right;    There were no vitals filed for this visit.  Visit Diagnosis:  Right knee pain  Stiffness of right knee  Difficulty walking  Decreased functional activity tolerance  Weakness of right leg      Subjective Assessment - 04/20/15 1816    Subjective Pt states she is concerned about going back to work at this time.  STates she has occasional shooting pain with grinding in her Rt knee.  Pt also reports she has "giving way" episodes and that is why she always has her cane.  Currently wtih 4/10 pain in her Rt knee.   Currently in Pain? Yes   Pain Score 4    Pain Location Knee   Pain Orientation Right             [facility] PT Assessment - 04/20/15 0001    Assessment   Medical Diagnosis R knee arthroscopy   Onset Date/Surgical Date 03/27/15   AROM   Right Knee Extension 2   Right Knee Flexion 132                     [facility] Adult PT Treatment/Exercise - 04/20/15 0001    Knee/Hip Exercises: Stretches   Active Hamstring Stretch Both;3 reps;30 seconds   Active Hamstring Stretch Limitations standing 12"    Gastroc Stretch Both;3 reps;30 seconds   Gastroc Stretch Limitations slantboard    Knee/Hip Exercises: Standing   Heel Raises Both;15 reps   Heel Raises Limitations with squats   Forward Lunges Both;1 set;10 reps   Forward Lunges Limitations 4 inch box    Lateral Step Up 15 reps;Hand Hold: 2;Right;Step Height: 4"   Forward Step Up Right;15 reps;Hand Hold: 1;Step Height: 6"   Step Down Right;15 reps;Hand Hold: 1;Step Height: 4"   Stairs 5RT with 1 HR and reciprocally 7" step   Knee/Hip Exercises: Supine   Patellar Mobs all directions   Manual Therapy   Manual Therapy Edema management;Joint mobilization   Edema Management Retro massage with LE elevated   Joint Mobilization patella mobs all directions  PT Short Term Goals - 04/10/15 1849    PT SHORT TERM GOAL #1   Title Pt will be independent with HEP.   Status On-going   PT SHORT TERM GOAL #2   Title Improve R knee flexion AROM to 110 to improve ability to ascend/descend stairs.    Status Achieved   PT SHORT TERM GOAL #3   Title Increase R knee extension ROM to -5 or greater to improve gait mechanics.    Status Achieved   PT SHORT TERM GOAL #4   Title Improve RLE strength to 4-/5 or greater to improve functional mobility.   Status On-going           PT Long Term Goals - 03/31/15 1656    PT LONG TERM GOAL #1   Title Pt will be independent with advanced HEP.   Time 3   Period Weeks   Status New   PT LONG TERM GOAL #2   Title Improve R knee ROM to 0-120 to normalize gait mechanics and  improve ability to navigate stairs.    Time 3   Period Weeks   Status New   PT LONG TERM GOAL #3   Title Pt will complete squats to 60 degrees or more while lifting 10# box with proper form to allow her to [employer] at work.    Time 3   Period Weeks   Status New   PT LONG TERM GOAL #4   Title Pt will ambulate 1,000 feet or more during 6 minute walk test to return her to PLOF of community ambulation.    Time 3   Period Weeks   Status New               Plan - 04/20/15 1811    Clinical Impression Statement continued focus on improving functional strength for return to work.  Progressed to reciprocal steps today with abiltiy to complete using 1 HR.  Unable to increase to 6" lateral step ups today due to pain and weakness.  Pt continues to have swelling and pain with occasional bouts of knee "giving way".  Pt requires more stablity training at this time to safely return to work.   PT Next Visit Plan Continue current PT POC to improve knee extension, functional stretching and strengthening.  Continue balance training.        Problem List Patient Active Problem List   Diagnosis Date Noted  . Primary osteoarthritis of right knee   . Medial meniscus, posterior horn derangement   . Dysesthesia 10/15/2014  . Gait disturbance 10/15/2014  . Essential hypertension, benign 05/27/2013  . Multiple sclerosis ([place]) 03/06/2013  . Hypertriglyceridemia 11/02/2012  . Hypokalemia 11/02/2012  . Depression with anxiety 11/02/2012  . DS (disseminated sclerosis) ([place]) 03/22/2011  . DIVERTICULITIS, COLON 08/11/2009  . IRRITABLE BOWEL SYNDROME 08/11/2009    Teena Irani, PTA/CLT [phone]  04/20/2015, 6:18 PM  [facility] [address] [place], [place], [address] Phone: [phone]   Fax:  [phone]  Name: KERRILEE MOYNAHAN MRN: [id number] Date of Birth: 1955-08-01

## 2015-04-21 ENCOUNTER — Encounter: Payer: Self-pay | Admitting: Orthopedic Surgery

## 2015-04-21 ENCOUNTER — Ambulatory Visit (INDEPENDENT_AMBULATORY_CARE_PROVIDER_SITE_OTHER): Payer: BLUE CROSS/BLUE SHIELD | Admitting: Orthopedic Surgery

## 2015-04-21 VITALS — BP 109/73 | Ht 64.0 in | Wt 190.0 lb

## 2015-04-21 DIAGNOSIS — Z4789 Encounter for other orthopedic aftercare: Secondary | ICD-10-CM

## 2015-04-21 DIAGNOSIS — Z9889 Other specified postprocedural states: Secondary | ICD-10-CM

## 2015-04-21 NOTE — Progress Notes (Signed)
Chief Complaint  Patient presents with  . Follow-up    3 week recheck on right knee, SARK, DOS 03-27-15.    Postop visit.postoperative day #25  PATIENT:  Krystal Silva [facility]  59 y.o. 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   The patient reports she is doing much better although she still has some sharp pain over the medial side of her knee and some popping and clicking  Flexion is resumed and back to normal full extension is noted on today's examination and there is no joint effusion  Continue physical therapy follow-up December 16 to determine if she can go back to work  Continue bracing continue exercising continue ice as needed  Encounter Diagnoses  Name Primary?  . S/P right knee arthroscopy   . Surgical aftercare, musculoskeletal system Yes

## 2015-04-22 ENCOUNTER — Ambulatory Visit (HOSPITAL_COMMUNITY): Payer: BLUE CROSS/BLUE SHIELD | Admitting: Physical Therapy

## 2015-04-22 DIAGNOSIS — M25561 Pain in right knee: Secondary | ICD-10-CM

## 2015-04-22 DIAGNOSIS — R262 Difficulty in walking, not elsewhere classified: Secondary | ICD-10-CM

## 2015-04-22 DIAGNOSIS — M25661 Stiffness of right knee, not elsewhere classified: Secondary | ICD-10-CM

## 2015-04-22 DIAGNOSIS — R6889 Other general symptoms and signs: Secondary | ICD-10-CM

## 2015-04-22 NOTE — Therapy (Signed)
[place] [facility], [place], [address] Phone: [phone]   Fax:  [phone]  Physical Therapy Treatment  Patient Details  Name: Krystal Silva MRN: [id number] Date of Birth: [birth year] Referring Provider: Arther Abbott MD  Encounter Date: 04/22/2015      PT End of Session - 04/22/15 1128    Visit Number 9   Number of Visits 10   Date for PT Re-Evaluation 04/30/15   Authorization Type BCBS   Authorization Time Period 03/31/15-05/31/15   PT Start Time 0845   PT Stop Time 0927   PT Time Calculation (min) 42 min   Activity Tolerance Patient tolerated treatment well   Behavior During Therapy [facility] for tasks assessed/performed      Past Medical History  Diagnosis Date  . Hypertension   . MS (multiple sclerosis) ([place])   . Diverticulitis   . Allergy   . Insomnia   . Vitamin D deficiency   . Hypercholesteremia     Past Surgical History  Procedure Laterality Date  . Tonsillectomy    . Wisdom tooth extraction  1980's  . Colonoscopy  08/21/2009  . Knee arthroscopy with medial menisectomy Right 03/27/2015    Procedure: KNEE ARTHROSCOPY WITH PARTIAL MEDIAL MENISECTOMY AND MICROFRACTURE;  Surgeon: Carole Civil, MD;  Location: [facility];  Service: Orthopedics;  Laterality: Right;    There were no vitals filed for this visit.  Visit Diagnosis:  Right knee pain  Stiffness of right knee  Difficulty walking  Decreased functional activity tolerance      Subjective Assessment - 04/22/15 0851    Subjective Pt reports that she saw the MD yesterday, he told her that she sharp pain she has been having in the medial knee is from where he had to go into the knee. Pt says that she is relieved to know what the pain is coming from. She reports that the pain is still limiting her the most. She is also having difficulty with stairs.    Currently in Pain? Yes   Pain Score 4    Pain Location Knee   Pain Orientation Right                          [facility] Adult PT Treatment/Exercise - 04/22/15 0001    Knee/Hip Exercises: Stretches   Active Hamstring Stretch Both;3 reps;30 seconds   Active Hamstring Stretch Limitations standing 12"    Gastroc Stretch Both;3 reps;30 seconds   Gastroc Stretch Limitations slantboard    Knee/Hip Exercises: Standing   Terminal Knee Extension 15 reps   Theraband Level (Terminal Knee Extension) Level 2 (Red)   Lateral Step Up 15 reps;Hand Hold: 2;Right;Step Height: 6"   Forward Step Up Right;15 reps;Hand Hold: 1;Step Height: 6"   Knee/Hip Exercises: Supine   Short Arc Quad Sets 2 sets;10 reps   Short Arc Quad Sets Limitations 3#   Bridges 2 sets;15 reps   Straight Leg Raises 2 sets;10 reps   Knee/Hip Exercises: Sidelying   Hip ABduction 15 reps   Manual Therapy   Manual Therapy Edema management;Soft tissue mobilization   Manual therapy comments performed after therex   Edema Management Retro massage with LE elevated   Soft tissue mobilization STM to quads, with focus on lateral quads to decrease muscle tightness                  PT Short Term Goals - 04/10/15 1849  PT SHORT TERM GOAL #1   Title Pt will be independent with HEP.   Status On-going   PT SHORT TERM GOAL #2   Title Improve R knee flexion AROM to 110 to improve ability to ascend/descend stairs.    Status Achieved   PT SHORT TERM GOAL #3   Title Increase R knee extension ROM to -5 or greater to improve gait mechanics.    Status Achieved   PT SHORT TERM GOAL #4   Title Improve RLE strength to 4-/5 or greater to improve functional mobility.   Status On-going           PT Long Term Goals - 03/31/15 1656    PT LONG TERM GOAL #1   Title Pt will be independent with advanced HEP.   Time 3   Period Weeks   Status New   PT LONG TERM GOAL #2   Title Improve R knee ROM to 0-120 to normalize gait mechanics and improve ability to navigate stairs.    Time 3   Period Weeks   Status New    PT LONG TERM GOAL #3   Title Pt will complete squats to 60 degrees or more while lifting 10# box with proper form to allow her to [employer] at work.    Time 3   Period Weeks   Status New   PT LONG TERM GOAL #4   Title Pt will ambulate 1,000 feet or more during 6 minute walk test to return her to PLOF of community ambulation.    Time 3   Period Weeks   Status New               Plan - 04/22/15 1129    Clinical Impression Statement Continued with functional strengthening today, with focus on improving quad and abductor strength to improve stability of knee. SAQ and SLR were reintroduced today with focus on full quad contraction, pt required verbal and tactile cueing for maintainng extension. Pt continues to have difficulty with completing step ups forward and laterally due to reports of increased pain. Treatment ended with manual therapy to address tightness in R quads and to decrease swelling. Pt reported decreased pain post treatment.    PT Next Visit Plan Continue with strengthening and balance training        Problem List Patient Active Problem List   Diagnosis Date Noted  . Primary osteoarthritis of right knee   . Medial meniscus, posterior horn derangement   . Dysesthesia 10/15/2014  . Gait disturbance 10/15/2014  . Essential hypertension, benign 05/27/2013  . Multiple sclerosis ([place]) 03/06/2013  . Hypertriglyceridemia 11/02/2012  . Hypokalemia 11/02/2012  . Depression with anxiety 11/02/2012  . DS (disseminated sclerosis) ([place]) 03/22/2011  . DIVERTICULITIS, COLON 08/11/2009  . IRRITABLE BOWEL SYNDROME 08/11/2009    Hilma Favors, PT, DPT [phone] 04/22/2015, 11:36 AM  [facility] [address] [place], [place], [address] Phone: [phone]   Fax:  [phone]  Name: Krystal Silva MRN: [id number] Date of Birth: 07/02/1955

## 2015-04-23 ENCOUNTER — Ambulatory Visit
Admission: RE | Admit: 2015-04-23 | Discharge: 2015-04-23 | Disposition: A | Discharge status: Home / Self Care | Payer: BLUE CROSS/BLUE SHIELD | Source: Ambulatory Visit | Attending: Neurology | Admitting: Neurology

## 2015-04-23 DIAGNOSIS — R208 Other disturbances of skin sensation: Secondary | ICD-10-CM

## 2015-04-23 DIAGNOSIS — G35 Multiple sclerosis: Secondary | ICD-10-CM | POA: Diagnosis not present

## 2015-04-23 MED ORDER — GADOBENATE DIMEGLUMINE 529 MG/ML IV SOLN
17.0000 mL | Freq: Once | INTRAVENOUS | Status: AC | PRN
  Administered 2015-04-23: 17 mL via INTRAVENOUS

## 2015-04-24 ENCOUNTER — Ambulatory Visit (HOSPITAL_COMMUNITY): Payer: BLUE CROSS/BLUE SHIELD | Attending: Orthopedic Surgery | Admitting: Physical Therapy

## 2015-04-24 ENCOUNTER — Telehealth: Payer: Self-pay | Admitting: *Deleted

## 2015-04-24 DIAGNOSIS — R29898 Other symptoms and signs involving the musculoskeletal system: Secondary | ICD-10-CM | POA: Insufficient documentation

## 2015-04-24 DIAGNOSIS — R6889 Other general symptoms and signs: Secondary | ICD-10-CM

## 2015-04-24 DIAGNOSIS — M25561 Pain in right knee: Secondary | ICD-10-CM | POA: Diagnosis not present

## 2015-04-24 DIAGNOSIS — M25661 Stiffness of right knee, not elsewhere classified: Secondary | ICD-10-CM | POA: Diagnosis present

## 2015-04-24 DIAGNOSIS — R262 Difficulty in walking, not elsewhere classified: Secondary | ICD-10-CM | POA: Insufficient documentation

## 2015-04-24 NOTE — Telephone Encounter (Signed)
-----   Message from Britt Bottom, MD sent at 04/24/2015  8:07 AM EST ----- Please let her know that the MRI of the brain was unchanged compared to the 1 1/2  years ago.

## 2015-04-24 NOTE — Telephone Encounter (Signed)
I have spoken with Krystal Silva this afternoon and per RAS, advised that when compared to mri 1 and 1/2 yrs. ago, recent mri shows no changes.  She verbalized understanding of same/fim

## 2015-04-24 NOTE — Therapy (Signed)
[place], [place], [address] Phone: [phone]   Fax:  [phone]  Physical Therapy Treatment  Patient Details  Name: Krystal Silva MRN: [id number] Date of Birth: March 17, 1956 Referring Provider: Arther Abbott MD  Encounter Date: 04/24/2015      PT End of Session - 04/24/15 1634    Visit Number 10   Number of Visits 14   Date for PT Re-Evaluation 04/30/15   Authorization Type BCBS   Authorization Time Period 03/31/15-05/31/15   PT Start Time 1345   PT Stop Time 1430   PT Time Calculation (min) 45 min   Activity Tolerance Patient tolerated treatment well   Behavior During Therapy [facility] for tasks assessed/performed      Past Medical History  Diagnosis Date  . Hypertension   . MS (multiple sclerosis) ([place])   . Diverticulitis   . Allergy   . Insomnia   . Vitamin D deficiency   . Hypercholesteremia     Past Surgical History  Procedure Laterality Date  . Tonsillectomy    . Wisdom tooth extraction  1980's  . Colonoscopy  08/21/2009  . Knee arthroscopy with medial menisectomy Right 03/27/2015    Procedure: KNEE ARTHROSCOPY WITH PARTIAL MEDIAL MENISECTOMY AND MICROFRACTURE;  Surgeon: Carole Civil, MD;  Location: [facility];  Service: Orthopedics;  Laterality: Right;    There were no vitals filed for this visit.  Visit Diagnosis:  Right knee pain  Stiffness of right knee  Difficulty walking  Decreased functional activity tolerance  Weakness of right leg      Subjective Assessment - 04/24/15 1350    Subjective Pt reports that her knee is still feeling pretty stiff, but she felt much better after the manual therapy that was performed last session.    Currently in Pain? Yes   Pain Score 4    Pain Location Knee   Pain Orientation Right                         [facility] Adult PT Treatment/Exercise - 04/24/15 0001    Knee/Hip Exercises: Stretches   Active Hamstring Stretch Both;3  reps;30 seconds   Active Hamstring Stretch Limitations standing 12"    Gastroc Stretch Both;3 reps;30 seconds   Gastroc Stretch Limitations slantboard    Knee/Hip Exercises: Standing   Forward Lunges Both;10 reps   Forward Lunges Limitations into // bars   Side Lunges Both;10 reps   Lateral Step Up 15 reps;Hand Hold: 2;Right;Step Height: 6"   Forward Step Up Right;15 reps;Hand Hold: 1;Step Height: 6"   Functional Squat 15 reps   Functional Squat Limitations squat then heel raises at 18" box   Knee/Hip Exercises: Supine   Short Arc Quad Sets 2 sets;15 reps   Short Arc Quad Sets Limitations 3#   Bridges 2 sets;15 reps   Straight Leg Raise with External Rotation 15 reps   Knee/Hip Exercises: Sidelying   Hip ABduction 15 reps;2 sets   Manual Therapy   Manual Therapy Edema management;Soft tissue mobilization   Manual therapy comments performed after therex   Edema Management Retro massage with LE elevated   Soft tissue mobilization STM to quads, with focus on lateral quads to decrease muscle tightness                  PT Short Term Goals - 04/10/15 1849    PT SHORT TERM GOAL #1   Title Pt will be  independent with HEP.   Status On-going   PT SHORT TERM GOAL #2   Title Improve R knee flexion AROM to 110 to improve ability to ascend/descend stairs.    Status Achieved   PT SHORT TERM GOAL #3   Title Increase R knee extension ROM to -5 or greater to improve gait mechanics.    Status Achieved   PT SHORT TERM GOAL #4   Title Improve RLE strength to 4-/5 or greater to improve functional mobility.   Status On-going           PT Long Term Goals - 03/31/15 1656    PT LONG TERM GOAL #1   Title Pt will be independent with advanced HEP.   Time 3   Period Weeks   Status New   PT LONG TERM GOAL #2   Title Improve R knee ROM to 0-120 to normalize gait mechanics and improve ability to navigate stairs.    Time 3   Period Weeks   Status New   PT LONG TERM GOAL #3   Title Pt  will complete squats to 60 degrees or more while lifting 10# box with proper form to allow her to [employer] at work.    Time 3   Period Weeks   Status New   PT LONG TERM GOAL #4   Title Pt will ambulate 1,000 feet or more during 6 minute walk test to return her to PLOF of community ambulation.    Time 3   Period Weeks   Status New               Plan - 04/24/15 1635    Clinical Impression Statement Progressed functional strengthening with lunging into // bars and side lunges on level surface today. Pt required verbal and visual cueing for proper form with these exercises, but was able to complete without pain. Tandem gait was added to improve balance and stability during gait, pt was able to complete without LOB. Treatment session ended with manual therapy to decrease swelling and decrease pain levels in R knee, pt demonstated increased tightness in lateral quads that decreased with manual therapy.    PT Next Visit Plan Add stair training, begin wall slides and sidestepping with tband        Problem List Patient Active Problem List   Diagnosis Date Noted  . Primary osteoarthritis of right knee   . Medial meniscus, posterior horn derangement   . Dysesthesia 10/15/2014  . Gait disturbance 10/15/2014  . Essential hypertension, benign 05/27/2013  . Multiple sclerosis ([place]) 03/06/2013  . Hypertriglyceridemia 11/02/2012  . Hypokalemia 11/02/2012  . Depression with anxiety 11/02/2012  . DS (disseminated sclerosis) ([place]) 03/22/2011  . DIVERTICULITIS, COLON 08/11/2009  . IRRITABLE BOWEL SYNDROME 08/11/2009    Hilma Favors, PT, DPT [phone] 04/24/2015, 4:39 PM  [place] [address] [place], [place], [address] Phone: [phone]   Fax:  [phone]  Name: ARISA GAUNTT MRN: [id number] Date of Birth: 22-Jul-1955

## 2015-04-28 ENCOUNTER — Ambulatory Visit (HOSPITAL_COMMUNITY): Payer: BLUE CROSS/BLUE SHIELD

## 2015-04-28 DIAGNOSIS — R29898 Other symptoms and signs involving the musculoskeletal system: Secondary | ICD-10-CM

## 2015-04-28 DIAGNOSIS — M25561 Pain in right knee: Secondary | ICD-10-CM

## 2015-04-28 DIAGNOSIS — M25661 Stiffness of right knee, not elsewhere classified: Secondary | ICD-10-CM

## 2015-04-28 DIAGNOSIS — R262 Difficulty in walking, not elsewhere classified: Secondary | ICD-10-CM

## 2015-04-28 DIAGNOSIS — R6889 Other general symptoms and signs: Secondary | ICD-10-CM

## 2015-04-28 NOTE — Therapy (Signed)
[place], [place], [address] Phone: [phone]   Fax:  [phone]  Physical Therapy Treatment  Patient Details  Name: Krystal Silva MRN: [id number] Date of Birth: Apr 02, 1956 Referring Provider: Arther Abbott MD  Encounter Date: 04/28/2015      PT End of Session - 04/28/15 1153    Visit Number 11   Number of Visits 14   Date for PT Re-Evaluation 04/30/15   Authorization Type BCBS   Authorization Time Period 03/31/15-05/31/15   PT Start Time 1150   PT Stop Time 1235   PT Time Calculation (min) 45 min   Activity Tolerance Patient tolerated treatment well   Behavior During Therapy [facility] for tasks assessed/performed      Past Medical History  Diagnosis Date  . Hypertension   . MS (multiple sclerosis) ([place])   . Diverticulitis   . Allergy   . Insomnia   . Vitamin D deficiency   . Hypercholesteremia     Past Surgical History  Procedure Laterality Date  . Tonsillectomy    . Wisdom tooth extraction  1980's  . Colonoscopy  08/21/2009  . Knee arthroscopy with medial menisectomy Right 03/27/2015    Procedure: KNEE ARTHROSCOPY WITH PARTIAL MEDIAL MENISECTOMY AND MICROFRACTURE;  Surgeon: Carole Civil, MD;  Location: [facility];  Service: Orthopedics;  Laterality: Right;    There were no vitals filed for this visit.  Visit Diagnosis:  Right knee pain  Stiffness of right knee  Difficulty walking  Decreased functional activity tolerance  Weakness of right leg      Subjective Assessment - 04/28/15 1150    Subjective Rt knee is feeling pretty stiff on medial aspect of knee, pain scale 4/10   Patient Stated Goals Be able to move her knee without pain, increase mobility of knee, be able to return to exercise and dancing, be able to take long walks without pain   Currently in Pain? Yes   Pain Score 4    Pain Location Knee   Pain Orientation Right   Pain Descriptors / Indicators Sharp;Dull;Tightness             [facility] Adult PT Treatment/Exercise - 04/28/15 0001    Exercises   Exercises Knee/Hip   Knee/Hip Exercises: Stretches   Active Hamstring Stretch Both;3 reps;30 seconds   Active Hamstring Stretch Limitations standing 12"    Knee: Self-Stretch Limitations knee drives on 8in step [id number] 10" to reduce stiffness   Gastroc Stretch Both;3 reps;30 seconds   Gastroc Stretch Limitations slantboard    Knee/Hip Exercises: Standing   Heel Raises Both;15 reps   Heel Raises Limitations with squats   Forward Lunges Both;10 reps   Forward Lunges Limitations into // bars   Lateral Step Up 15 reps;Hand Hold: 2;Right;Step Height: 6"   Functional Squat 15 reps   Functional Squat Limitations squat then heel raises at 18" box   Wall Squat 10 reps;5 seconds   Stairs 5RT with 1 HR and reciprocally 7" step   SLS Rt 37", Lt 13" max of 3   Other Standing Knee Exercises tandem gait    Other Standing Knee Exercises sidestepping 2RT with RTB   Manual Therapy   Manual Therapy Edema management;Soft tissue mobilization   Manual therapy comments performed after therex:  supine with LE elevated   Edema Management Retro massage with LE elevated   Soft tissue mobilization STM to quads, with focus on medial and lateral quads to decrease muscle tightness  PT Short Term Goals - 04/10/15 1849    PT SHORT TERM GOAL #1   Title Pt will be independent with HEP.   Status On-going   PT SHORT TERM GOAL #2   Title Improve R knee flexion AROM to 110 to improve ability to ascend/descend stairs.    Status Achieved   PT SHORT TERM GOAL #3   Title Increase R knee extension ROM to -5 or greater to improve gait mechanics.    Status Achieved   PT SHORT TERM GOAL #4   Title Improve RLE strength to 4-/5 or greater to improve functional mobility.   Status On-going           PT Long Term Goals - 03/31/15 1656    PT LONG TERM GOAL #1   Title Pt will be independent with advanced HEP.   Time 3   Period  Weeks   Status New   PT LONG TERM GOAL #2   Title Improve R knee ROM to 0-120 to normalize gait mechanics and improve ability to navigate stairs.    Time 3   Period Weeks   Status New   PT LONG TERM GOAL #3   Title Pt will complete squats to 60 degrees or more while lifting 10# box with proper form to allow her to [employer] at work.    Time 3   Period Weeks   Status New   PT LONG TERM GOAL #4   Title Pt will ambulate 1,000 feet or more during 6 minute walk test to return her to PLOF of community ambulation.    Time 3   Period Weeks   Status New               Plan - 04/28/15 1415    Clinical Impression Statement Session focus on improving functional strengthening and manual therapy technqiues.  Advanced to stair training reciprocal pattern on 7in step height with cueing for controlled eccentric control for quad strengthening.  Began wall slides for strengthening with cueing for form to equalize weight bearing prior squatting.  Pt continues to demonstrate impaired balance, continued with balance trainng on balance beam, SLS and sidestepping to improve stabilty with gait and to reduce risk of falls.  Ended session with manual technqiues to decrease swelling and decrease pain on medial aspect of Rt knee.  Pt stated pain reduced at end of session following manual, pt encouraged to apply ice at home for pain and edema control.     PT Next Visit Plan Continue with current PT POC for 2 more sessions then reassess prior MD apt.          Problem List Patient Active Problem List   Diagnosis Date Noted  . Primary osteoarthritis of right knee   . Medial meniscus, posterior horn derangement   . Dysesthesia 10/15/2014  . Gait disturbance 10/15/2014  . Essential hypertension, benign 05/27/2013  . Multiple sclerosis ([place]) 03/06/2013  . Hypertriglyceridemia 11/02/2012  . Hypokalemia 11/02/2012  . Depression with anxiety 11/02/2012  . DS (disseminated sclerosis) ([place]) 03/22/2011  .  DIVERTICULITIS, COLON 08/11/2009  . IRRITABLE BOWEL SYNDROME 08/11/2009   Ihor Austin, LPTA; [place]  Aldona Lento 04/28/2015, 2:22 PM  [facility] [address] [place], [place], [address] Phone: [phone]   Fax:  [phone]  Name: KEASHA SIRMONS MRN: [id number] Date of Birth: 08-11-1955

## 2015-04-29 ENCOUNTER — Ambulatory Visit (HOSPITAL_COMMUNITY): Payer: BLUE CROSS/BLUE SHIELD | Admitting: Physical Therapy

## 2015-04-29 DIAGNOSIS — R262 Difficulty in walking, not elsewhere classified: Secondary | ICD-10-CM

## 2015-04-29 DIAGNOSIS — M25561 Pain in right knee: Secondary | ICD-10-CM | POA: Diagnosis not present

## 2015-04-29 DIAGNOSIS — R6889 Other general symptoms and signs: Secondary | ICD-10-CM

## 2015-04-29 DIAGNOSIS — M25661 Stiffness of right knee, not elsewhere classified: Secondary | ICD-10-CM

## 2015-04-29 DIAGNOSIS — R29898 Other symptoms and signs involving the musculoskeletal system: Secondary | ICD-10-CM

## 2015-04-29 NOTE — Therapy (Signed)
[place], [place], [address] Phone: [phone]   Fax:  [phone]  Physical Therapy Treatment  Patient Details  Name: Krystal Silva MRN: [id number] Date of Birth: [birth year] Referring Provider: Arther Abbott MD  Encounter Date: 04/29/2015      PT End of Session - 04/29/15 1142    Visit Number 12   Number of Visits 14   Date for PT Re-Evaluation 04/30/15   Authorization Type BCBS   Authorization Time Period 03/31/15-05/31/15   PT Start Time 0808   PT Stop Time 0850   PT Time Calculation (min) 42 min   Activity Tolerance Patient tolerated treatment well   Behavior During Therapy [facility] for tasks assessed/performed      Past Medical History  Diagnosis Date  . Hypertension   . MS (multiple sclerosis) ([place])   . Diverticulitis   . Allergy   . Insomnia   . Vitamin D deficiency   . Hypercholesteremia     Past Surgical History  Procedure Laterality Date  . Tonsillectomy    . Wisdom tooth extraction  1980's  . Colonoscopy  08/21/2009  . Knee arthroscopy with medial menisectomy Right 03/27/2015    Procedure: KNEE ARTHROSCOPY WITH PARTIAL MEDIAL MENISECTOMY AND MICROFRACTURE;  Surgeon: Carole Civil, MD;  Location: [facility];  Service: Orthopedics;  Laterality: Right;    There were no vitals filed for this visit.  Visit Diagnosis:  Right knee pain  Stiffness of right knee  Difficulty walking  Weakness of right leg  Decreased functional activity tolerance      Subjective Assessment - 04/29/15 1141    Subjective Pt states her knee is pretty painful today.  States she thinks it's from adding the wallslides last session.   Currently in Pain? Yes   Pain Score 6    Pain Location Knee   Pain Orientation Right   Pain Descriptors / Indicators Aching;Sharp;Tender                         [facility] Adult PT Treatment/Exercise - 04/29/15 0819    Knee/Hip Exercises: Stretches   Active  Hamstring Stretch Both;3 reps;30 seconds   Active Hamstring Stretch Limitations standing 12"    Knee: Self-Stretch Limitations knee drives on 8in step [id number] 10" to reduce stiffness   Gastroc Stretch Both;3 reps;30 seconds   Gastroc Stretch Limitations slantboard    Knee/Hip Exercises: Standing   Heel Raises Both;20 reps   Heel Raises Limitations with squats   Forward Lunges Both;10 reps   Forward Lunges Limitations into // bars   Lateral Step Up 15 reps;Hand Hold: 2;Right;Step Height: 6"   Forward Step Up Right;15 reps;Hand Hold: 1;Step Height: 6"   Step Down Right;15 reps;Hand Hold: 1;Step Height: 4"   Wall Squat 10 reps;5 seconds   Other Standing Knee Exercises tandem gait 2RT   Other Standing Knee Exercises sidestepping 2RT with RTB   Manual Therapy   Manual Therapy Edema management;Soft tissue mobilization   Manual therapy comments performed after therex:  supine with LE elevated   Edema Management Retro massage with LE elevated   Soft tissue mobilization STM to quads, with focus on medial and lateral quads to decrease muscle tightness                  PT Short Term Goals - 04/10/15 1849    PT SHORT TERM GOAL #1   Title Pt will be independent with  HEP.   Status On-going   PT SHORT TERM GOAL #2   Title Improve R knee flexion AROM to 110 to improve ability to ascend/descend stairs.    Status Achieved   PT SHORT TERM GOAL #3   Title Increase R knee extension ROM to -5 or greater to improve gait mechanics.    Status Achieved   PT SHORT TERM GOAL #4   Title Improve RLE strength to 4-/5 or greater to improve functional mobility.   Status On-going           PT Long Term Goals - 03/31/15 1656    PT LONG TERM GOAL #1   Title Pt will be independent with advanced HEP.   Time 3   Period Weeks   Status New   PT LONG TERM GOAL #2   Title Improve R knee ROM to 0-120 to normalize gait mechanics and improve ability to navigate stairs.    Time 3   Period Weeks   Status  New   PT LONG TERM GOAL #3   Title Pt will complete squats to 60 degrees or more while lifting 10# box with proper form to allow her to [employer] at work.    Time 3   Period Weeks   Status New   PT LONG TERM GOAL #4   Title Pt will ambulate 1,000 feet or more during 6 minute walk test to return her to PLOF of community ambulation.    Time 3   Period Weeks   Status New               Plan - 04/29/15 1144    Clinical Impression Statement Continued to progress functional strengthening with manual techniques to decrease swelling and pain.  Improved balance today with actvities despite increased pain.  Extra time spent on manual techniques with overall reduction of pain at end of session.  No new actvities added today due to increased pain.    PT Next Visit Plan Reassess next session.        Problem List Patient Active Problem List   Diagnosis Date Noted  . Primary osteoarthritis of right knee   . Medial meniscus, posterior horn derangement   . Dysesthesia 10/15/2014  . Gait disturbance 10/15/2014  . Essential hypertension, benign 05/27/2013  . Multiple sclerosis ([place]) 03/06/2013  . Hypertriglyceridemia 11/02/2012  . Hypokalemia 11/02/2012  . Depression with anxiety 11/02/2012  . DS (disseminated sclerosis) ([place]) 03/22/2011  . DIVERTICULITIS, COLON 08/11/2009  . IRRITABLE BOWEL SYNDROME 08/11/2009    Teena Irani, PTA/CLT [phone] 04/29/2015, 11:47 AM  [facility] [address] [place], [place], [address] Phone: [phone]   Fax:  [phone]  Name: Krystal Silva MRN: [id number] Date of Birth: [birth year]

## 2015-05-01 ENCOUNTER — Ambulatory Visit (HOSPITAL_COMMUNITY): Payer: BLUE CROSS/BLUE SHIELD | Admitting: Physical Therapy

## 2015-05-01 DIAGNOSIS — R29898 Other symptoms and signs involving the musculoskeletal system: Secondary | ICD-10-CM

## 2015-05-01 DIAGNOSIS — M25661 Stiffness of right knee, not elsewhere classified: Secondary | ICD-10-CM

## 2015-05-01 DIAGNOSIS — R262 Difficulty in walking, not elsewhere classified: Secondary | ICD-10-CM

## 2015-05-01 DIAGNOSIS — M25561 Pain in right knee: Secondary | ICD-10-CM

## 2015-05-01 DIAGNOSIS — R6889 Other general symptoms and signs: Secondary | ICD-10-CM

## 2015-05-01 NOTE — Therapy (Signed)
[place], [place], [address] Phone: [phone]   Fax:  [phone]  Physical Therapy Treatment (Re-Assessment)  Patient Details  Name: Krystal Silva MRN: [id number] Date of Birth: 31-May-1955 Referring Provider: Arther Abbott MD  Encounter Date: 05/01/2015      PT End of Session - 05/01/15 1353    Visit Number 13   Number of Visits 13   Date for PT Re-Evaluation 05/29/15   Authorization Type BCBS   Authorization Time Period 03/31/15-05/31/15   PT Start Time 1151   PT Stop Time 1233   PT Time Calculation (min) 42 min   Activity Tolerance Patient tolerated treatment well   Behavior During Therapy [facility] for tasks assessed/performed      Past Medical History  Diagnosis Date  . Hypertension   . MS (multiple sclerosis) ([place])   . Diverticulitis   . Allergy   . Insomnia   . Vitamin D deficiency   . Hypercholesteremia     Past Surgical History  Procedure Laterality Date  . Tonsillectomy    . Wisdom tooth extraction  1980's  . Colonoscopy  08/21/2009  . Knee arthroscopy with medial menisectomy Right 03/27/2015    Procedure: KNEE ARTHROSCOPY WITH PARTIAL MEDIAL MENISECTOMY AND MICROFRACTURE;  Surgeon: Carole Civil, MD;  Location: [facility];  Service: Orthopedics;  Laterality: Right;    There were no vitals filed for this visit.  Visit Diagnosis:  Right knee pain  Stiffness of right knee  Difficulty walking  Weakness of right leg  Decreased functional activity tolerance      Subjective Assessment - 05/01/15 1152    Subjective Patient reports that she is back to having her normal level of pain today, around 4/10; reports that besides the stairs she feels she is doing pretty well however    How long can you sit comfortably? 12/9- 45-60 minutes, but gets very stiff    How long can you stand comfortably? 12/9- 45 minutes at best max    How long can you walk comfortably? 12/9- 30 minutes    Patient  Stated Goals Be able to move her knee without pain, increase mobility of knee, be able to return to exercise and dancing, be able to take long walks without pain   Currently in Pain? Yes   Pain Score 4    Pain Location Knee   Pain Orientation Right            [facility] PT Assessment - 05/01/15 0001    Observation/Other Assessments   Focus on Therapeutic Outcomes (FOTO)  58% limited    AROM   Right Knee Extension 2   Right Knee Flexion 131   Strength   Right Hip Flexion 4+/5   Right Hip Extension 4-/5   Right Hip ABduction 4+/5   Left Hip Flexion 4/5   Left Hip Extension 4+/5   Left Hip ABduction 5/5   Right Knee Flexion 4-/5   Right Knee Extension 4/5   Left Knee Flexion 4+/5   Left Knee Extension 4+/5   Transfers   Five time sit to stand comments  9.73   6 minute walk test results    Aerobic Endurance Distance Walked 1243   Endurance additional comments 6MWT; gait speed 1.6ms                      [facility] Adult PT Treatment/Exercise - 05/01/15 0001    Knee/Hip Exercises: Standing   Forward  Lunges --                PT Education - 05/01/15 1353    Education provided Yes   Education Details progress wtih skilled PT services, plan of care moving forward, PT recommendation to continue with PT due to pain and functional weakness, updated HEP    Person(s) Educated Patient   Methods Explanation;Handout   Comprehension Verbalized understanding;Returned demonstration          PT Short Term Goals - 05/01/15 1215    PT SHORT TERM GOAL #1   Title Pt will be independent with HEP.   Time 1.5   Period Weeks   Status Achieved   PT SHORT TERM GOAL #2   Title Improve R knee flexion AROM to 110 to improve ability to ascend/descend stairs.    Time 1.5   Period Weeks   Status Achieved   PT SHORT TERM GOAL #3   Title Increase R knee extension ROM to -5 or greater to improve gait mechanics.    Time 1.5   Period Weeks   Status Achieved   PT SHORT TERM GOAL  #4   Title Improve RLE strength to 4-/5 or greater to improve functional mobility.   Time 1.5   Period Weeks   Status Achieved           PT Long Term Goals - 05/01/15 1216    PT LONG TERM GOAL #1   Title Pt will be independent with advanced HEP.   Time 3   Period Weeks   Status On-going   PT LONG TERM GOAL #2   Title Improve R knee ROM to 0-120 to normalize gait mechanics and improve ability to navigate stairs.    Time 3   Period Weeks   Status Achieved   PT LONG TERM GOAL #3   Title Pt will complete squats to 60 degrees or more while lifting 10# box with proper form to allow her to [employer] at work.    Baseline 12/9- able to squat to 60 degrees but significant increase in pain and not proper form   PT LONG TERM GOAL #4   Title Pt will ambulate 1,000 feet or more during 6 minute walk test to return her to PLOF of community ambulation.    Time 3   Period Weeks   Status Achieved               Plan - 05/01/15 1354    Clinical Impression Statement Re-assessment performed today. Patient does show general improvement and has met all long and short term goals with the exception of being able to perform functional squat with weighted box in order to allow her to return to work without exacerbation of pain in her surgical knee= in fact at this time patient only able to tolerate squat to 60 degrees today, unable to complete lift with box due to pain. Strongly recommended that patient continue with skilled PT services, however she reported that she would like to wait until MD tells her if she can return to work or not when she sees him next weeek. At this time gave patient more advanced HEP to work on at home, reqeusted that she call to schedule more appointments to continue to work on functional limitations if MD does not allow her to return to work yet.    Pt will benefit from skilled therapeutic intervention in order to improve on the following deficits Abnormal gait;Decreased  activity tolerance;Decreased balance;Decreased  endurance;Decreased strength;Decreased range of motion;Difficulty walking;Hypomobility;Pain   Rehab Potential Good   PT Frequency Other (comment)  on hold    PT Duration Other (comment)  on hold    PT Treatment/Interventions ADLs/Self Care Home Management;Cryotherapy;Electrical Stimulation;Gait training;Stair training;Functional mobility training;Therapeutic activities;Therapeutic exercise;Balance training;Neuromuscular re-education;Patient/family education;Manual techniques   PT Next Visit Plan strongly recommend that patient continue with skilled PT services; patient wants to hold off until MD tells her if she can return to work or not next week   Consulted and Agree with Plan of Care Patient        Problem List Patient Active Problem List   Diagnosis Date Noted  . Primary osteoarthritis of right knee   . Medial meniscus, posterior horn derangement   . Dysesthesia 10/15/2014  . Gait disturbance 10/15/2014  . Essential hypertension, benign 05/27/2013  . Multiple sclerosis ([place]) 03/06/2013  . Hypertriglyceridemia 11/02/2012  . Hypokalemia 11/02/2012  . Depression with anxiety 11/02/2012  . DS (disseminated sclerosis) ([place]) 03/22/2011  . DIVERTICULITIS, COLON 08/11/2009  . IRRITABLE BOWEL SYNDROME 08/11/2009   Physical Therapy Progress Note  Dates of Reporting Period: 03/31/15 to 05/01/15  Objective Reports of Subjective Statement: see above   Objective Measurements: see above   Goal Update: see above   Plan: see above   Reason Skilled Services are Required: significant functional weakness contributing to inability to squat with light weighted load secondary to pain     Deniece Ree PT, DPT [place], [place], [address] Phone: [phone]   Fax:  [phone]  Name: Krystal Silva MRN: [id number] Date of Birth: 09-02-1955

## 2015-05-01 NOTE — Patient Instructions (Signed)
   LUNGE FORWARD - BOX  While standing on the ground with a step in front of you, place your foot foward and onto a step as shown.    Allow your front and back knees to bend as you lower your back knee towards the ground into a lunge position. Do not allow your front knee to pass your toes.   Return to the original position and then perform with the other leg.   Repeat 10 times each leg, 2-3 times per day.    Lateral Lunge (ONTO STAIR)  Step to the side with the step foot pointed forward as shown.  Lower the hips so that the step-side thigh is parallel with the floor.  Keep the core tight to prevent bending in the lumbar spine.  Drive with the stance-side thigh and buttocks to return to standing.  Repeat 10 times each leg, 2-3 times per day.    SQUATS (LAYING AN EGG)  While standing with feet shoulder width apart and in front of a stable support for balance assist if needed, bend your knees and lower your body towards the floor. Your body weight should mostly be directed through the heels of your feet. Return to a standing position.   Knees should bend in line with the 2nd toe and not pass the front of the foot.  Start by doing 5 reps at a time, and work your way up as it becomes more pain free.Repeat 2-3 times per day.

## 2015-05-06 ENCOUNTER — Ambulatory Visit (INDEPENDENT_AMBULATORY_CARE_PROVIDER_SITE_OTHER): Payer: Self-pay | Admitting: Orthopedic Surgery

## 2015-05-06 ENCOUNTER — Encounter: Payer: Self-pay | Admitting: Orthopedic Surgery

## 2015-05-06 ENCOUNTER — Other Ambulatory Visit: Payer: Self-pay | Admitting: Family Medicine

## 2015-05-06 VITALS — BP 123/72 | Ht 64.0 in | Wt 190.0 lb

## 2015-05-06 DIAGNOSIS — Z4789 Encounter for other orthopedic aftercare: Secondary | ICD-10-CM

## 2015-05-06 DIAGNOSIS — Z9889 Other specified postprocedural states: Secondary | ICD-10-CM

## 2015-05-06 DIAGNOSIS — S83241D Other tear of medial meniscus, current injury, right knee, subsequent encounter: Secondary | ICD-10-CM

## 2015-05-06 MED ORDER — HYDROCODONE-ACETAMINOPHEN 5-325 MG PO TABS: 1.0000 | ORAL_TABLET | Freq: Four times a day (QID) | ORAL | Status: DC | PRN

## 2015-05-06 NOTE — Progress Notes (Signed)
Chief Complaint  Patient presents with  . Follow-up    2 week follow up [place], [place] 03/27/15    Postop visit.postoperative day 40  She has medial knee pain. She is in a hinged knee brace economy hinged style wrap on. Her meniscal signs have resolved. She is currently on alternating ibuprofen naproxen and occasional hydrocodone for breakthrough pain  Her swelling has gone down she has full range of motion she has tenderness over the medial femoral condyle and minimal if any tenderness over the medial meniscus  We recommend that she continue bracing take naproxen one twice a day for the next 4 weeks I'll see her week 5 take hydrocodone as needed for breakthrough pain to return to work on Monday    PATIENT: Krystal Silva [facility] 59 y.o. 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

## 2015-05-06 NOTE — Patient Instructions (Addendum)
Take the Naproxen twice daily x 4 weeks  Continue wearing brace  Return to work 05/11/15

## 2015-05-07 ENCOUNTER — Ambulatory Visit: Payer: BLUE CROSS/BLUE SHIELD | Admitting: Orthopedic Surgery

## 2015-05-09 ENCOUNTER — Other Ambulatory Visit: Payer: Self-pay | Admitting: Family Medicine

## 2015-06-02 ENCOUNTER — Other Ambulatory Visit: Payer: Self-pay | Admitting: Family Medicine

## 2015-06-08 ENCOUNTER — Ambulatory Visit (INDEPENDENT_AMBULATORY_CARE_PROVIDER_SITE_OTHER): Payer: BLUE CROSS/BLUE SHIELD | Admitting: Orthopedic Surgery

## 2015-06-08 VITALS — BP 108/76 | Ht 64.0 in | Wt 190.0 lb

## 2015-06-08 DIAGNOSIS — Z4789 Encounter for other orthopedic aftercare: Secondary | ICD-10-CM

## 2015-06-08 DIAGNOSIS — Z9889 Other specified postprocedural states: Secondary | ICD-10-CM

## 2015-06-08 NOTE — Progress Notes (Signed)
Chief Complaint  Patient presents with  . Follow-up    5 week recheck on right knee, SARK, DOS 03-27-15.    The patient is 10 weeks status post arthroscopy partial medial meniscectomy and microfracture for torn medial meniscus chondral 4 lesion in the medial femoral condyle with grade 2 chondral lesion of the patella  She says her knee is improved but she was to get rid of her brace she still has some clicking in the knee and still has some mild medial compartment pain  No swelling and she is back at work  Recommend removing the brace take the naproxen as needed follow-up with [place] in 3 months

## 2015-06-08 NOTE — Patient Instructions (Addendum)
Ok to remove brace  Naproxen as needed

## 2015-06-17 ENCOUNTER — Other Ambulatory Visit: Payer: Self-pay | Admitting: Family Medicine

## 2015-07-26 ENCOUNTER — Other Ambulatory Visit: Payer: Self-pay | Admitting: Family Medicine

## 2015-07-29 ENCOUNTER — Other Ambulatory Visit: Payer: Self-pay | Admitting: Family Medicine

## 2015-07-29 ENCOUNTER — Telehealth: Payer: Self-pay | Admitting: Nurse Practitioner

## 2015-07-29 DIAGNOSIS — Z1231 Encounter for screening mammogram for malignant neoplasm of breast: Secondary | ICD-10-CM

## 2015-07-29 NOTE — Telephone Encounter (Signed)
Pt was issued 15 day supply, needs OV but can't until after the 31st of march Has an appt 10 apr for physical can we send in a full months supply or does she  Need to request another refill once the 15 runs out  [facility]

## 2015-07-30 ENCOUNTER — Other Ambulatory Visit: Payer: Self-pay | Admitting: Nurse Practitioner

## 2015-07-30 MED ORDER — LOSARTAN POTASSIUM-HCTZ 100-12.5 MG PO TABS: ORAL_TABLET | ORAL | Status: DC

## 2015-07-30 MED ORDER — POTASSIUM CHLORIDE CRYS ER 10 MEQ PO TBCR: 10.0000 meq | EXTENDED_RELEASE_TABLET | Freq: Every day | ORAL | Status: DC

## 2015-07-30 MED ORDER — SIMVASTATIN 40 MG PO TABS: 40.0000 mg | ORAL_TABLET | Freq: Every day | ORAL | Status: DC

## 2015-07-30 MED ORDER — SERTRALINE HCL 100 MG PO TABS: ORAL_TABLET | ORAL | Status: DC

## 2015-07-30 NOTE — Telephone Encounter (Signed)
I sent 30 day supply to last her until her physical.

## 2015-07-30 NOTE — Telephone Encounter (Signed)
[facility] 07/30/15

## 2015-07-30 NOTE — Telephone Encounter (Signed)
Spoke with patient and informed her per Linzie Collin- 30 day supply sent in to last her until her physical. Patient verbalized understanding.

## 2015-08-19 ENCOUNTER — Ambulatory Visit (INDEPENDENT_AMBULATORY_CARE_PROVIDER_SITE_OTHER): Payer: BLUE CROSS/BLUE SHIELD | Admitting: Neurology

## 2015-08-19 ENCOUNTER — Encounter: Payer: Self-pay | Admitting: Neurology

## 2015-08-19 VITALS — BP 136/90 | HR 68 | Resp 14 | Ht 64.0 in | Wt 185.5 lb

## 2015-08-19 DIAGNOSIS — G35 Multiple sclerosis: Secondary | ICD-10-CM | POA: Diagnosis not present

## 2015-08-19 DIAGNOSIS — F418 Other specified anxiety disorders: Secondary | ICD-10-CM | POA: Diagnosis not present

## 2015-08-19 DIAGNOSIS — R208 Other disturbances of skin sensation: Secondary | ICD-10-CM | POA: Diagnosis not present

## 2015-08-19 DIAGNOSIS — R269 Unspecified abnormalities of gait and mobility: Secondary | ICD-10-CM

## 2015-08-19 NOTE — Progress Notes (Signed)
[facility]  PATIENT: Krystal Silva DOB: 1956/05/10  REFERRING DOCTOR OR PCP:   Coralie Keens SOURCE: patient, records from initial Neurology group and MRI images on PACS/CD  _________________________________   HISTORICAL  CHIEF COMPLAINT:  Chief Complaint  Patient presents with  . Multiple Sclerosis    Sts. she continues to tolerate Avonex well.  MRI brain done on 04-23-15 showed no new MS changes.  She is having right knee pain, but is followed by ortho for this (had meniscus repair in Nov. 2016)/fim    HISTORY OF PRESENT ILLNESS:  Krystal Silva is a 60 year old woman with MS.   She is on Avonex. She is tolerating it for the most part but still gets flulike reactions with some of her shots. She has not had any exacerbations.    Her last MRI was 04/23/2015 and was unchanged compared to the one 07/2013.   Recent labs with PCP showed normal LFT's.     Gait/strength/sensation: Gait is worse due to right knee pain (had recent surgery for torn meniscus).  Prior to knee hurting, she had slightly reduced balance.    She denies any significant weakness in the arms or legs. She has dysesthetic pain in both shins.   She also has a spot of numbness in the left heel.  Gabapentin was tried but was poorly tolerated.  We tried imipramine and gabapentin but they were poorly tolerated.     She has appt to see ortho later this week for f/u.     Bladder: She denies any significant problem with urinary function. Specifically, she does not have nocturia, frequency or hesitancy.  Vision: She denies any current MS related vision problem. She did have the double vision in 2003 without completely resolved. She has never had optic neuritis.  Fatigue/sleep: She has no problem with physical or mental fatigue. 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   She sleeps well almost every night.  Mood/cognition: She feels mood is good now.   She is on sertraline  and tolerates it well.    She takes care of her 59 yo father.   She denies any significant difficulty with cognition. She has not noted problems with her memory.   She works full time.   Hearing: She wears hearing aids bilaterally.  She has a little bit of tinnitus   She has a family history of deafness.  MS History:  In 2003, she had an episode of diplopia. An MRI was performed but she was not diagnosed with MS at that time. In 2006, in [place],  she presented with dysesthetic sensations in the legs, mostly around the shin. She had MRI's performed that were more consistent with MS. She also had a lumbar puncture and the CSF was consistent with MS. The CSF showed oligoclonal bands and IgG index of 1.37 (elevated). She started to see Dr. George Hugh and then Dr. Shelia Media in 2010/2011 after moving to the area.    An MRI performed in 2011 was reportedly very similar to the one from 2006. She has been on Avonex therapy since diagnosis.[name]  REVIEW OF SYSTEMS: Constitutional: No fevers, chills, sweats, or change in appetite Eyes: No visual changes, double vision, eye pain Ear, nose and throat: No hearing loss, ear pain, nasal congestion, sore throat Cardiovascular: No chest pain, palpitations Respiratory: No shortness of breath at rest or with exertion.   No wheezes GastrointestinaI: No nausea, vomiting, diarrhea, abdominal pain, fecal  incontinence Genitourinary: No dysuria, urinary retention or frequency.  No nocturia. Musculoskeletal: No neck pain, back pain Integumentary: No rash, pruritus, skin lesions Neurological: as above Psychiatric: No depression at this time.  No anxiety Endocrine: No palpitations, diaphoresis, change in appetite, change in weigh or increased thirst Hematologic/Lymphatic: No anemia, purpura, petechiae. Allergic/Immunologic: No itchy/runny eyes, nasal congestion, recent allergic reactions, rashes  ALLERGIES: Allergies  Allergen Reactions  . Codeine   . Penicillins      REACTION: rash  . Sulfonamide Derivatives   . Sulfamethoxazole Rash    HOME MEDICATIONS:  Current outpatient prescriptions:  .  baclofen (LIORESAL) 10 MG tablet, TAKE 1 TABLET BY MOUTH tid prn, Disp: 90 each, Rfl: 11 .  Cholecalciferol (VITAMIN D-3) 5000 UNITS TABS, Take by mouth daily., Disp: , Rfl:  .  ibuprofen (ADVIL,MOTRIN) 800 MG tablet, TAKE 1 TABLET BY MOUTH 3 TIMES A DAY AS NEEDED FOR PAIN, Disp: 90 tablet, Rfl: 1 .  Interferon Beta-1a (AVONEX PEN) 30 MCG/0.5ML AJKT, Inject 30 mcg into the muscle every 7 (seven) days., Disp: 1 each, Rfl: 6 .  LACTOBACILLUS RHAMNOSUS, GG, PO, Take 4 capsules by mouth 4 (four) times daily as needed., Disp: , Rfl:  .  losartan-hydrochlorothiazide (HYZAAR) 100-12.5 MG tablet, TAKE 1 TABLET BY MOUTH EVERY DAY FOR BLOOD PRESSURE, Disp: 30 tablet, Rfl: 0 .  Multiple Vitamin (MULTIVITAMIN) tablet, Take 1 tablet by mouth daily., Disp: , Rfl:  .  NAPROXEN PO, Take by mouth., Disp: , Rfl:  .  potassium chloride (KLOR-CON M10) 10 MEQ tablet, Take 1 tablet (10 mEq total) by mouth daily., Disp: 30 tablet, Rfl: 0 .  Probiotic Product (PROBIOTIC DAILY PO), Take by mouth., Disp: , Rfl:  .  sertraline (ZOLOFT) 100 MG tablet, TAKE 1 TABLET (100 MG TOTAL) BY MOUTH DAILY., Disp: 30 tablet, Rfl: 0 .  simvastatin (ZOCOR) 40 MG tablet, Take 1 tablet (40 mg total) by mouth at bedtime., Disp: 30 tablet, Rfl: 0 .  vitamin E 400 UNIT capsule, Take 800 Units by mouth daily., Disp: , Rfl:   PAST MEDICAL HISTORY: Past Medical History  Diagnosis Date  . Hypertension   . MS (multiple sclerosis) ([place])   . Diverticulitis   . Allergy   . Insomnia   . Vitamin D deficiency   . Hypercholesteremia     PAST SURGICAL HISTORY: Past Surgical History  Procedure Laterality Date  . Tonsillectomy    . Wisdom tooth extraction  1980's  . Colonoscopy  08/21/2009  . Knee arthroscopy with medial menisectomy Right 03/27/2015    Procedure: KNEE ARTHROSCOPY WITH PARTIAL MEDIAL MENISECTOMY  AND MICROFRACTURE;  Surgeon: Carole Civil, MD;  Location: [facility];  Service: Orthopedics;  Laterality: Right;    FAMILY HISTORY: Family History  Problem Relation Age of Onset  . Hypertension Mother   . Hyperlipidemia Father   . Hearing loss Father   . Hyperlipidemia Brother   . Diabetes Brother   . Vision loss Maternal Grandmother   . Vision loss Paternal Grandmother   . Cancer Other     breast    SOCIAL HISTORY:  Social History   Social History  . Marital Status: Divorced    Spouse Name: N/A  . Number of Children: N/A  . Years of Education: 60   Occupational History  . work     Social History Main Topics  . Smoking status: Former Smoker -- 1.50 packs/day for 30 years    Types: Cigarettes    Start date: 12/26/1974  Quit date: 06/22/2004  . Smokeless tobacco: Never Used  . Alcohol Use: 0.6 oz/week    1 Cans of beer per week     Comment: rare social  . Drug Use: No  . Sexual Activity: Not Currently   Other Topics Concern  . Not on file   Social History Narrative   Coffee in the morning couple of cups    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     PHYSICAL EXAM  Filed Vitals:   08/19/15 0933  BP: 136/90  Pulse: 68  Resp: 14  Height: 5\' 4"  (1.626 m)  Weight: 185 lb 8 oz (84.142 kg)    Body mass index is 31.83 kg/(m^2).   General: The patient is well-developed and well-nourished and in no acute distress  Musculoskeletal:  Back is nontender  Neurologic Exam  Mental status: The patient is alert and oriented x 3 at the time of the examination. The patient has apparent normal recent and remote memory, with an apparently normal attention span and concentration ability.   Speech is normal.  Cranial nerves: Extraocular movements are full.   Facial symmetry is present. There is good facial sensation to soft touch bilaterally.Facial strength is normal.  Trapezius and sternocleidomastoid strength is normal. No dysarthria is noted.  No obvious hearing deficits are  noted.  Motor:  Muscle bulk is normal.   Tone is mildly increased in legs. Strength is  5 / 5 in all 4 extremities except 4+/5 in right EHL.   Sensory: Sensory testing is intact to pinprick, soft touch and vibration sensation in legs and arms  Coordination: Cerebellar testing reveals good finger-nose-finger and heel-to-shin bilaterally.  Gait and station: Station is normal.   Gait is minimally wide. Tandem gait is wide. Romberg is negative.   Reflexes: Deep tendon reflexes are symmetric and increased bilaterally with  2 beats non-sustained clonus at ankles.        DIAGNOSTIC DATA (LABS, IMAGING, TESTING) - I reviewed patient records, labs, notes, testing and imaging myself where available.  I reviewed her LFT from 08/02/2015 ---  They were normal.       ASSESSMENT AND PLAN  Multiple sclerosis ([place])  Dysesthesia  Gait disturbance  Depression with anxiety   1.   Continue Avonex -  LFT's are recently normal.   2.   She will see ortho about her knee.    3.   Unable to tolerate gabapentin or  imipramine.   Consider adding lamotrigine for dysesthesia if pain worsens.   4.  Return to see me in 6 months or sooner if there are new or worsening neurologic symptoms.  Richard A. Felecia Shelling, MD, PhD [id number], [id number] AM Certified in Neurology, Clinical Neurophysiology, Sleep Medicine, Pain Medicine and Neuroimaging  [facility] [address] [place], [place] [address] [phone]

## 2015-08-20 ENCOUNTER — Ambulatory Visit (INDEPENDENT_AMBULATORY_CARE_PROVIDER_SITE_OTHER): Payer: BLUE CROSS/BLUE SHIELD | Admitting: Orthopedic Surgery

## 2015-08-20 VITALS — BP 133/81 | HR 57 | Ht 64.0 in | Wt 185.0 lb

## 2015-08-20 DIAGNOSIS — M25561 Pain in right knee: Secondary | ICD-10-CM | POA: Diagnosis not present

## 2015-08-20 DIAGNOSIS — Z9889 Other specified postprocedural states: Secondary | ICD-10-CM

## 2015-08-20 MED ORDER — DICLOFENAC SODIUM 75 MG PO TBEC
75.0000 mg | DELAYED_RELEASE_TABLET | Freq: Two times a day (BID) | ORAL | Status: DC
Start: 2015-08-20 — End: 2015-10-05

## 2015-08-20 NOTE — Progress Notes (Signed)
Patient ID: Krystal Silva, female   DOB: November 20, 1955, 60 y.o.   MRN: [id number]  Chief Complaint  Patient presents with  . 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 She had 2 days of severe pain loss of motion described as aching burning medial joint. She said she felt something pop.  She took diclofenac and has almost fully recovered  Review of Systems  Constitutional: Negative for fever.    BP 133/81 mmHg  Pulse 57  Ht 5\' 4"  (1.626 m)  Wt 185 lb (83.915 kg)  BMI 31.74 kg/m2  Physical Exam  Constitutional: She is oriented to person, place, and time. She appears well-developed and well-nourished. No distress.  Cardiovascular: Normal rate and intact distal pulses.   Neurological: She is alert and oriented to person, place, and time. She has normal reflexes. She exhibits normal muscle tone. Coordination normal.  Skin: Skin is warm and dry. No rash noted. She is not diaphoretic. No erythema. No pallor.  Psychiatric: She has a normal mood and affect. Her behavior is normal. Judgment and thought content normal.    Ortho Exam  Left knee looks good no swelling no tenderness range of motion is free and easy skin is normal  Right knee mild tenderness over the previous microfracture site joint line is nontender there is no effusion she has regained her full range of motion her knee is stable strength is normal scans intact she has no peripheral edema the limb is warm to touch and she has normal sensation  ASSESSMENT AND PLAN   Acute onset knee pain after arthroscopy microfracture doing well after taking some diclofenac  Continue diclofenac and keep appointment for April 19

## 2015-08-24 ENCOUNTER — Ambulatory Visit (HOSPITAL_COMMUNITY): Payer: BLUE CROSS/BLUE SHIELD

## 2015-08-24 ENCOUNTER — Other Ambulatory Visit: Payer: Self-pay | Admitting: Family Medicine

## 2015-08-24 ENCOUNTER — Ambulatory Visit (HOSPITAL_COMMUNITY)
Admission: RE | Admit: 2015-08-24 | Discharge: 2015-08-24 | Disposition: A | Discharge status: Home / Self Care | Payer: BLUE CROSS/BLUE SHIELD | Source: Ambulatory Visit | Attending: Family Medicine | Admitting: Family Medicine

## 2015-08-24 DIAGNOSIS — Z1231 Encounter for screening mammogram for malignant neoplasm of breast: Secondary | ICD-10-CM

## 2015-08-31 ENCOUNTER — Encounter: Payer: Self-pay | Admitting: Nurse Practitioner

## 2015-08-31 ENCOUNTER — Ambulatory Visit (INDEPENDENT_AMBULATORY_CARE_PROVIDER_SITE_OTHER): Payer: BLUE CROSS/BLUE SHIELD | Admitting: Nurse Practitioner

## 2015-08-31 VITALS — BP 130/80 | Ht 64.0 in | Wt 183.0 lb

## 2015-08-31 DIAGNOSIS — Z0000 Encounter for general adult medical examination without abnormal findings: Secondary | ICD-10-CM

## 2015-08-31 NOTE — Progress Notes (Addendum)
   Subjective:    Patient ID: Krystal Silva, female    DOB: 1955-09-09, 60 y.o.   MRN: [id number]  HPI presents for her wellness exam. No new partners. No vaginal bleeding or pelvic pain. Regular dental and vision exams. Minimal activity. Has been doing PT after knee surgery. Still gets regular follow up this specialist for MS.    Review of Systems  Constitutional: Negative for activity change, appetite change and fatigue.  HENT: Positive for hearing loss. Negative for dental problem, ear pain, sinus pressure and sore throat.        Wears hearing aids  Respiratory: Negative for cough, chest tightness, shortness of breath and wheezing.   Cardiovascular: Negative for chest pain.  Gastrointestinal: Negative for nausea, vomiting, abdominal pain, diarrhea, constipation and abdominal distention.  Genitourinary: Negative for dysuria, urgency, frequency, vaginal bleeding, vaginal discharge, enuresis, difficulty urinating, genital sores and pelvic pain.       Objective:   Physical Exam  Constitutional: She is oriented to person, place, and time. She appears well-developed. No distress.  HENT:  Right Ear: External ear normal.  Left Ear: External ear normal.  Mouth/Throat: Oropharynx is clear and moist.  Neck: Normal range of motion. Neck supple. No tracheal deviation present. No thyromegaly present.  Cardiovascular: Normal rate, regular rhythm and normal heart sounds.  Exam reveals no gallop.   No murmur heard. Pulmonary/Chest: Effort normal and breath sounds normal.  Abdominal: Soft. She exhibits no distension. There is no tenderness.  Genitourinary: Vagina normal and uterus normal. No vaginal discharge found.  External GU: no rashes or lesions. Vagina: no discharge. No CMT. Bimanual exam: no tenderness or obvious masses. Rectal exam: no masses; no stool for hemoccult.   Lymphadenopathy:    She has no cervical adenopathy.  Neurological: She is alert and oriented to person, place, and time.    Skin: Skin is warm and dry. No rash noted.  Psychiatric: She has a normal mood and affect. Her behavior is normal.  Vitals reviewed. Breast exam: dense tissue; no masses; axillae no adenopathy.         Assessment & Plan:  Routine general medical examination at a health care facility - Plan: POC Hemoccult Bld/Stl (3-Cd Home Screen), POC Hemoccult Bld/Stl (3-Cd Home Screen)  Recommend activity as tolerated and daily vitamin D/calcium supplementation.  Reviewed recent labs done through work, a paper copy was not available during office visit that patient was able to show these on her phone. Triglycerides much improved. LDL below 100. HDL now above 40. A paper copy was faxed her office, will see if we can find that for scanning for chart. Return in about 6 months (around 03/01/2016) for recheck.

## 2015-09-01 ENCOUNTER — Other Ambulatory Visit: Payer: Self-pay | Admitting: Dermatology

## 2015-09-08 ENCOUNTER — Encounter: Payer: Self-pay | Admitting: Orthopedic Surgery

## 2015-09-08 ENCOUNTER — Ambulatory Visit (INDEPENDENT_AMBULATORY_CARE_PROVIDER_SITE_OTHER): Payer: BLUE CROSS/BLUE SHIELD | Admitting: Orthopedic Surgery

## 2015-09-08 VITALS — BP 123/74 | Ht 64.0 in | Wt 183.0 lb

## 2015-09-08 DIAGNOSIS — Z9889 Other specified postprocedural states: Secondary | ICD-10-CM | POA: Diagnosis not present

## 2015-09-08 DIAGNOSIS — S83241D Other tear of medial meniscus, current injury, right knee, subsequent encounter: Secondary | ICD-10-CM | POA: Diagnosis not present

## 2015-09-08 DIAGNOSIS — M1711 Unilateral primary osteoarthritis, right knee: Secondary | ICD-10-CM | POA: Diagnosis not present

## 2015-09-08 MED ORDER — DICLOFENAC SODIUM 1 % TD GEL: 4.0000 g | Freq: Four times a day (QID) | TRANSDERMAL | Status: DC

## 2015-09-08 NOTE — Progress Notes (Signed)
Chief Complaint  Patient presents with  . Follow-up    follow up Rt knee, [facility] 03/27/15   The patient is now 5-1/2 months status post arthroscopy right knee partial medial meniscectomy microfracture medial femoral condyle.  She was doing well with both parents 75 mg twice a day until she had an episode of GI bleeding. Prior to that she had been treated with ibuprofen and naproxen. She actually took naproxen for her post MS injections   She complains of medial knee pain and swelling and weightbearing pain through the medial joint line and proximal tibia  Review of systems catching locking and giving way have not been significant symptoms  BP 123/74 mmHg  Ht 5\' 4"  (1.626 m)  Wt 183 lb (83.008 kg)  BMI 31.40 kg/m2  Physical Exam  Constitutional: She is oriented to person, place, and time. She appears well-developed and well-nourished. No distress.  Cardiovascular: Normal rate and intact distal pulses.   Neurological: She is alert and oriented to person, place, and time. She has normal reflexes. She exhibits normal muscle tone. Coordination normal.  Skin: Skin is warm and dry. No rash noted. She is not diaphoretic. No erythema. No pallor.  Psychiatric: She has a normal mood and affect. Her behavior is normal. Judgment and thought content normal.  The right knee is swollen compared to the left she has tenderness medial joint line medial femoral condyle. She has small effusion. Knee flexion is 125 knee is stable.  I've advised her to stop all anti-inflammatories and we will try Voltaren topical. If it is not approved we will use a different over-the-counter preparation such as Aspercreme with lidocaine and put her on Ultracet  Follow-up 4 weeks  Meds ordered this encounter  Medications  . diclofenac sodium (VOLTAREN) 1 % GEL    Sig: Apply 4 g topically 4 (four) times daily.    Dispense:  5 Tube    Refill:  5

## 2015-09-09 ENCOUNTER — Other Ambulatory Visit: Payer: Self-pay | Admitting: Nurse Practitioner

## 2015-09-10 ENCOUNTER — Telehealth: Payer: Self-pay | Admitting: Orthopedic Surgery

## 2015-09-10 ENCOUNTER — Telehealth: Payer: Self-pay

## 2015-09-10 NOTE — Telephone Encounter (Signed)
Working on pre auth, spoke with insurance and they are faxing a form

## 2015-09-11 NOTE — Telephone Encounter (Signed)
preauth form completed and faxed yesterday, called patient, no answer

## 2015-09-14 NOTE — Therapy (Signed)
[place], [place], [address] Phone: [phone]   Fax:  [phone]  Patient Details  Name: ELDORIS BEISER MRN: [id number] Date of Birth: 03-May-1956 Referring Provider:  Carole Civil, MD  Encounter Date: 09/14/2015  PHYSICAL THERAPY DISCHARGE SUMMARY  Visits from Start of Care: 13  Current functional level related to goals / functional outcomes: Patient has not returned since last skilled session    Remaining deficits: Unable to assess    Education / Equipment: None  Plan: Patient agrees to discharge.  Patient goals were partially met. Patient is being discharged due to not returning since the last visit.  ?????       Deniece Ree PT, DPT [facility] [address] [place], [place], [address] Phone: [phone]   Fax:  [phone]

## 2015-09-14 NOTE — Telephone Encounter (Signed)
Duplicate message. 

## 2015-09-14 NOTE — Telephone Encounter (Signed)
Received approval from [employer], copy of approval faxed to patients pharmacy

## 2015-09-25 LAB — POC HEMOCCULT BLD/STL (HOME/3-CARD/SCREEN)
Card #3 Fecal Occult Blood, POC: NEGATIVE
FECAL OCCULT BLD: NEGATIVE
Fecal Occult Blood, POC: NEGATIVE

## 2015-10-05 ENCOUNTER — Ambulatory Visit (INDEPENDENT_AMBULATORY_CARE_PROVIDER_SITE_OTHER): Payer: BLUE CROSS/BLUE SHIELD | Admitting: Orthopedic Surgery

## 2015-10-05 ENCOUNTER — Encounter: Payer: Self-pay | Admitting: Orthopedic Surgery

## 2015-10-05 VITALS — BP 116/77 | HR 70 | Ht 64.0 in | Wt 183.0 lb

## 2015-10-05 DIAGNOSIS — Z9889 Other specified postprocedural states: Secondary | ICD-10-CM

## 2015-10-05 DIAGNOSIS — M1711 Unilateral primary osteoarthritis, right knee: Secondary | ICD-10-CM | POA: Diagnosis not present

## 2015-10-05 NOTE — Progress Notes (Signed)
Patient ID: Krystal Silva, female   DOB: 09/20/1955, 60 y.o.   MRN: [id number]  Follow-up right knee status post knee arthroscopy with primary osteoarthritis of the knee over the medial compartment where   HPI- she had a torn medial meniscus meniscectomy and microfracture  She still having discomfort in the medial side of her right knee with stiffness after sitting  She is currently on Voltaren gel after oral inset therapy gave her some GI upset.  Review of Systems  Musculoskeletal:       Catching locking denied    BP 116/77 mmHg  Pulse 70  Ht 5\' 4"  (1.626 m)  Wt 183 lb (83.008 kg)  BMI 31.40 kg/m2  Physical Exam Physical Exam  Constitutional: The patient is oriented to person, place, and time. The patient appears well-developed and well-nourished. No distress.  Cardiovascular: Intact distal pulses.   Neurological: The patient is alert and oriented to person, place, and time. The patient exhibits normal muscle tone. Coordination normal.  Skin: Skin is warm and dry. No rash noted. The patient is not diaphoretic. No erythema. No pallor.  Psychiatric: The patient has a normal mood and affect. Her behavior is normal. Judgment and thought content normal.   Gait remarkable for decreased stride length slight limp and flexed knee weightbearing  Ortho Exam  Knee extension -5 of full extension flexion 120 the compartment tenderness no effusion knee stable  ASSESSMENT AND PLAN   Encounter Diagnoses  Name Primary?  . S/P right knee arthroscopy   . Primary osteoarthritis of knee, right Yes   Recommend orthovisc injection when approved  Patient counseled on effects and injection.

## 2015-10-05 NOTE — Patient Instructions (Addendum)
orthovisc  Continue voltaren gel

## 2015-10-07 ENCOUNTER — Other Ambulatory Visit: Payer: Self-pay | Admitting: Nurse Practitioner

## 2015-10-12 ENCOUNTER — Other Ambulatory Visit: Payer: Self-pay | Admitting: *Deleted

## 2015-10-12 ENCOUNTER — Telehealth: Payer: Self-pay | Admitting: *Deleted

## 2015-10-12 MED ORDER — HYLAN G-F 20 48 MG/6ML IX SOSY: PREFILLED_SYRINGE | INTRA_ARTICULAR | Status: DC

## 2015-10-12 NOTE — Telephone Encounter (Signed)
Received fax regarding Orthovisc injection, [employer] does not cover this medication, prefers Synvisc, sent order for Synvisc to [facility] which is a Administrator.

## 2015-10-14 ENCOUNTER — Telehealth: Payer: Self-pay | Admitting: Orthopedic Surgery

## 2015-10-14 NOTE — Telephone Encounter (Signed)
Patient called to follow up on status of Orthovisc/Synvisc medication.  I relayed the information noted thus far and of the process in general.  Her best call back # [phone] if any further information to relay.

## 2015-10-26 ENCOUNTER — Telehealth: Payer: Self-pay | Admitting: Orthopedic Surgery

## 2015-10-26 NOTE — Telephone Encounter (Signed)
Patient called again wanting to know if we had heard anything regarding the medication for her Synvisc injections.  Please call her and advise.  Thanks

## 2015-10-26 NOTE — Telephone Encounter (Signed)
WE HAVE NOT HEARD ANYTHING ELSE, THEY WERE FOLLOWING UP WITH HER INSURANCE AND WILL CALL HER ABOUT SHIPPING

## 2015-11-01 ENCOUNTER — Other Ambulatory Visit: Payer: Self-pay | Admitting: Nurse Practitioner

## 2015-11-02 ENCOUNTER — Ambulatory Visit (INDEPENDENT_AMBULATORY_CARE_PROVIDER_SITE_OTHER): Payer: BLUE CROSS/BLUE SHIELD | Admitting: Orthopedic Surgery

## 2015-11-02 ENCOUNTER — Encounter: Payer: Self-pay | Admitting: Orthopedic Surgery

## 2015-11-02 VITALS — BP 125/83 | Ht 64.0 in | Wt 183.0 lb

## 2015-11-02 DIAGNOSIS — M1711 Unilateral primary osteoarthritis, right knee: Secondary | ICD-10-CM

## 2015-11-02 DIAGNOSIS — Z9889 Other specified postprocedural states: Secondary | ICD-10-CM | POA: Diagnosis not present

## 2015-11-02 MED ORDER — TRAMADOL-ACETAMINOPHEN 37.5-325 MG PO TABS: 1.0000 | ORAL_TABLET | Freq: Four times a day (QID) | ORAL | Status: DC | PRN

## 2015-11-02 NOTE — Patient Instructions (Signed)

## 2015-11-02 NOTE — Progress Notes (Signed)
   synvisc injection KNEE right   The patient has osteoarthritis of the knee. She has been on anti-inflammatories and had cortisone injection and has failed that conservative treatment.  The patient has been approved and wishes to try hyaluronic acid injection in the form synvisc The right  knee is prepped with alcohol and ethyl chloride  The injection is performed with a [id number] needle  No complications were noted  Appropriate instructions post injection were given

## 2015-11-09 ENCOUNTER — Telehealth: Payer: Self-pay | Admitting: *Deleted

## 2015-11-09 ENCOUNTER — Ambulatory Visit: Payer: BLUE CROSS/BLUE SHIELD | Admitting: Orthopedic Surgery

## 2015-11-09 ENCOUNTER — Other Ambulatory Visit: Payer: Self-pay | Admitting: *Deleted

## 2015-11-09 MED ORDER — ETODOLAC 300 MG PO CAPS: 300.0000 mg | ORAL_CAPSULE | Freq: Three times a day (TID) | ORAL | Status: DC

## 2015-11-09 NOTE — Telephone Encounter (Signed)
Try her on lodine 300 mg tid # 90

## 2015-11-09 NOTE — Telephone Encounter (Signed)
Patient called stating that she can not take the Tramadol that was prescribed, due to it gives her a severe headache. Can you recommend anything else she can try, because she said it is still hurting?

## 2015-11-09 NOTE — Telephone Encounter (Signed)
Sent in RX

## 2015-12-14 ENCOUNTER — Encounter: Payer: Self-pay | Admitting: Orthopedic Surgery

## 2015-12-14 ENCOUNTER — Ambulatory Visit (INDEPENDENT_AMBULATORY_CARE_PROVIDER_SITE_OTHER): Payer: BLUE CROSS/BLUE SHIELD | Admitting: Orthopedic Surgery

## 2015-12-14 VITALS — BP 121/76 | Ht 64.25 in | Wt 185.0 lb

## 2015-12-14 DIAGNOSIS — S83241D Other tear of medial meniscus, current injury, right knee, subsequent encounter: Secondary | ICD-10-CM

## 2015-12-14 DIAGNOSIS — Z9889 Other specified postprocedural states: Secondary | ICD-10-CM

## 2015-12-14 DIAGNOSIS — M1711 Unilateral primary osteoarthritis, right knee: Secondary | ICD-10-CM

## 2015-12-14 NOTE — Progress Notes (Signed)
Chief Complaint  Patient presents with  . Follow-up    right knee s/p synvisc    Routine follow-up status post injection Synvisc right knee status post arthroscopy right knee November 2016 still complains of aching pain inability to perform activities which she is accustomed to such as hiking and dancing.  She says the knee is only slightly better.  At this point she will eventually come to total knee replacement. She says she can tolerate the pain at this time  Recommend x-ray in November correlate with symptoms

## 2015-12-22 ENCOUNTER — Other Ambulatory Visit: Payer: Self-pay | Admitting: *Deleted

## 2015-12-22 MED ORDER — INTERFERON BETA-1A 30 MCG/0.5ML IM AJKT: 30.0000 ug | AUTO-INJECTOR | INTRAMUSCULAR | 6 refills | Status: DC

## 2016-02-10 ENCOUNTER — Other Ambulatory Visit: Payer: Self-pay

## 2016-02-10 MED ORDER — SIMVASTATIN 40 MG PO TABS: 40.0000 mg | ORAL_TABLET | Freq: Every day | ORAL | 0 refills | Status: DC

## 2016-02-10 MED ORDER — LOSARTAN POTASSIUM-HCTZ 100-12.5 MG PO TABS: ORAL_TABLET | ORAL | 0 refills | Status: DC

## 2016-02-15 ENCOUNTER — Encounter: Payer: Self-pay | Admitting: Neurology

## 2016-02-15 ENCOUNTER — Ambulatory Visit (INDEPENDENT_AMBULATORY_CARE_PROVIDER_SITE_OTHER): Payer: BLUE CROSS/BLUE SHIELD | Admitting: Neurology

## 2016-02-15 VITALS — BP 154/86 | HR 70 | Resp 16 | Ht 64.0 in | Wt 187.0 lb

## 2016-02-15 DIAGNOSIS — R208 Other disturbances of skin sensation: Secondary | ICD-10-CM

## 2016-02-15 DIAGNOSIS — G35 Multiple sclerosis: Secondary | ICD-10-CM | POA: Diagnosis not present

## 2016-02-15 DIAGNOSIS — R269 Unspecified abnormalities of gait and mobility: Secondary | ICD-10-CM | POA: Diagnosis not present

## 2016-02-15 DIAGNOSIS — M1711 Unilateral primary osteoarthritis, right knee: Secondary | ICD-10-CM | POA: Diagnosis not present

## 2016-02-15 MED ORDER — LAMOTRIGINE 100 MG PO TABS: ORAL_TABLET | ORAL | 11 refills | Status: DC

## 2016-02-15 NOTE — Patient Instructions (Signed)
The pharmacy has the prescription for lamotrigine 100 mg tablets. For 5 days, just take one half pill a day. For the next 5 days, take one half pill twice a day. For the next 5 days, take one half pill 3 times a day Then start taking one pill twice a day from this point on.    In the future, we may increase the dose further.  If you get a rash, need to stop the medication and not take it again. 

## 2016-02-15 NOTE — Progress Notes (Signed)
[facility]  PATIENT: Krystal Silva DOB: 01/26/1956  REFERRING DOCTOR OR PCP:   Coralie Keens SOURCE: patient, records from initial Neurology group and MRI images on PACS/CD  _________________________________   HISTORICAL  CHIEF COMPLAINT:  Chief Complaint  Patient presents with  . Multiple Sclerosis    Sts. she tolerates Avonex well, but is running out of inj. sites.  She denies new or worsening sx./fim    HISTORY OF PRESENT ILLNESS:  Krystal Silva is a 60-year-old woman with MS.     MS:   She  Feels her MS is stable and notes no exacerbation.   She is on Avonex. She is not tolerating it great --- she gets flulike reactions that are mild x 1 day but feels she is running out of sites and she has nodules everywhere in the th thighs.      Her last MRI was 04/23/2015 and was unchanged compared to the one 07/2013.   Recent labs with PCP showed normal LFT's.     Gait/strength/sensation: Gait has a limp but she feels it is due to the right knee not MS.   Balance is slightly off though.   She denies any significant weakness in the arms or legs. She has dysesthetic pain in both lower legs.     We tried imipramine and gabapentin but they were poorly tolerated.       Bladder: She denies any significant problem with urinary function. Specifically, she does not have nocturia, frequency or hesitancy.  Vision: She denies any current MS related vision problem. She did have the double vision in 2003 without completely resolved. No diplopia when tired or hot.  She has never had optic neuritis.  Fatigue/sleep: She has no problem with physical or mental fatigue. She works on her feet for much of the day.   She sleeps well almost every night.  Mood/cognition: She feels mood is good now with just rare irritability.     She is on sertraline and tolerates it well.    Her father went to assisted living (4 yo) so less stress.    She denies any significant difficulty with cognition. She has not noted problems with her memory.   She works full time.   Hearing: She wears hearing aids bilaterally.  She has a little bit of tinnitus   She has a family history of deafness.  Knee:   She had recent surgery for knee pain and instability and does not think the surgery helped so a TKR is being considered.     MS History:  In 2003, she had an episode of diplopia. An MRI was performed but she was not diagnosed with MS at that time. In 2006, in [place],  she presented with dysesthetic sensations in the legs, mostly around the shin. She had MRI's performed that were more consistent with MS. She also had a lumbar puncture and the CSF was consistent with MS. The CSF showed oligoclonal bands and IgG index of 1.37 (elevated). She started to see Dr. George Hugh and then Dr. Shelia Media in 2010/2011 after moving to the area.    An MRI performed in 2011 was reportedly very similar to the one from 2006. She has been on Avonex therapy since diagnosis.[name]  REVIEW OF SYSTEMS: Constitutional: No fevers, chills, sweats, or change in appetite Eyes: No visual changes, double vision, eye pain Ear, nose and throat: No hearing loss, ear pain, nasal congestion, sore throat Cardiovascular: No chest pain,  palpitations Respiratory: No shortness of breath at rest or with exertion.   No wheezes GastrointestinaI: No nausea, vomiting, diarrhea, abdominal pain, fecal incontinence Genitourinary: No dysuria, urinary retention or frequency.  No nocturia. Musculoskeletal: No neck pain, back pain Integumentary: No rash, pruritus, skin lesions Neurological: as above Psychiatric: No depression at this time.  No anxiety Endocrine: No palpitations, diaphoresis, change in appetite, change in weigh or increased thirst Hematologic/Lymphatic: No anemia, purpura, petechiae. Allergic/Immunologic: No itchy/runny eyes, nasal congestion, recent allergic reactions,  rashes  ALLERGIES: Allergies  Allergen Reactions  . Codeine   . Penicillins     REACTION: rash  . Sulfonamide Derivatives   . Sulfamethoxazole Rash    HOME MEDICATIONS:  Current Outpatient Prescriptions:  .  baclofen (LIORESAL) 10 MG tablet, TAKE 1 TABLET BY MOUTH tid prn, Disp: 90 each, Rfl: 11 .  Cholecalciferol (VITAMIN D-3) 5000 UNITS TABS, Take by mouth daily., Disp: , Rfl:  .  Hylan 48 MG/6ML SOSY, Inject into right knee once, Disp: 1 Syringe, Rfl: 0 .  ibuprofen (ADVIL,MOTRIN) 800 MG tablet, TAKE 1 TABLET BY MOUTH 3 TIMES A DAY AS NEEDED FOR PAIN, Disp: 90 tablet, Rfl: 1 .  Interferon Beta-1a (AVONEX PEN) 30 MCG/0.5ML AJKT, Inject 30 mcg into the muscle every 7 (seven) days., Disp: 1 each, Rfl: 6 .  KLOR-CON M10 10 MEQ tablet, TAKE 1 TABLET BY MOUTH EVERY DAY, Disp: 30 tablet, Rfl: 5 .  losartan-hydrochlorothiazide (HYZAAR) 100-12.5 MG tablet, TAKE 1 TABLET BY MOUTH EVERY DAY FOR BLOOD PRESSURE, Disp: 90 tablet, Rfl: 0 .  Multiple Vitamin (MULTIVITAMIN) tablet, Take 1 tablet by mouth daily., Disp: , Rfl:  .  NAPROXEN PO, Take by mouth., Disp: , Rfl:  .  Probiotic Product (PROBIOTIC DAILY PO), Take by mouth., Disp: , Rfl:  .  sertraline (ZOLOFT) 100 MG tablet, TAKE 1 TABLET BY MOUTH EVERY DAY, Disp: 30 tablet, Rfl: 5 .  simvastatin (ZOCOR) 40 MG tablet, Take 1 tablet (40 mg total) by mouth at bedtime., Disp: 90 tablet, Rfl: 0 .  vitamin E 400 UNIT capsule, Take 800 Units by mouth daily., Disp: , Rfl:  .  diclofenac sodium (VOLTAREN) 1 % GEL, Apply 4 g topically 4 (four) times daily. (Patient not taking: Reported on 02/15/2016), Disp: 5 Tube, Rfl: 5 .  etodolac (LODINE) 300 MG capsule, Take 1 capsule (300 mg total) by mouth 3 (three) times daily. (Patient not taking: Reported on 02/15/2016), Disp: 90 capsule, Rfl: 0 .  LACTOBACILLUS RHAMNOSUS, GG, PO, Take 4 capsules by mouth 4 (four) times daily as needed., Disp: , Rfl:  .  lamoTRIgine (LAMICTAL) 100 MG tablet, Take as directed up  to twice a day, Disp: 60 tablet, Rfl: 11 .  traMADol-acetaminophen (ULTRACET) 37.5-325 MG tablet, Take 1 tablet by mouth every 6 (six) hours as needed. (Patient not taking: Reported on 02/15/2016), Disp: 120 tablet, Rfl: 1  PAST MEDICAL HISTORY: Past Medical History:  Diagnosis Date  . Allergy   . Diverticulitis   . Hypercholesteremia   . Hypertension   . Insomnia   . MS (multiple sclerosis) ([place])   . Vitamin D deficiency     PAST SURGICAL HISTORY: Past Surgical History:  Procedure Laterality Date  . COLONOSCOPY  08/21/2009  . KNEE ARTHROSCOPY WITH MEDIAL MENISECTOMY Right 03/27/2015   Procedure: KNEE ARTHROSCOPY WITH PARTIAL MEDIAL MENISECTOMY AND MICROFRACTURE;  Surgeon: Carole Civil, MD;  Location: [facility];  Service: Orthopedics;  Laterality: Right;  . TONSILLECTOMY    . WISDOM TOOTH EXTRACTION  1980's    FAMILY HISTORY: Family History  Problem Relation Age of Onset  . Hypertension Mother   . Hyperlipidemia Father   . Hearing loss Father   . Hyperlipidemia Brother   . Diabetes Brother   . Vision loss Maternal Grandmother   . Vision loss Paternal Grandmother   . Cancer Other     breast    SOCIAL HISTORY:  Social History   Social History  . Marital status: Divorced    Spouse name: N/A  . Number of children: N/A  . Years of education: 50   Occupational History  . work     Social History Main Topics  . Smoking status: Former Smoker    Packs/day: 1.50    Years: 30.00    Types: Cigarettes    Start date: 12/26/1974    Quit date: 06/22/2004  . Smokeless tobacco: Never Used  . Alcohol use 0.6 oz/week    1 Cans of beer per week     Comment: rare social  . Drug use: No  . Sexual activity: Not Currently   Other Topics Concern  . Not on file   Social History Narrative   Coffee in the morning couple of cups    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     PHYSICAL EXAM  Vitals:   02/15/16 0936  BP: (!) 154/86  Pulse: 70  Resp: 16  Weight: 187 lb (84.8 kg)   Height: 5\' 4"  (1.626 m)    Body mass index is 32.1 kg/m.   General: The patient is well-developed and well-nourished and in no acute distress  Musculoskeletal:  Back is nontender  Neurologic Exam  Mental status: The patient is alert and oriented x 3 at the time of the examination. The patient has apparent normal recent and remote memory, with an apparently normal attention span and concentration ability.   Speech is normal.  Cranial nerves: Extraocular movements are full.   Facial symmetry is present. There is good facial sensation to soft touch bilaterally.Facial strength is normal.  Trapezius and sternocleidomastoid strength is normal. No dysarthria is noted.  No obvious hearing deficits are noted.  Motor:  Muscle bulk is normal.   Tone is mildly increased in legs. Strength is  5 / 5 in all 4 extremities except 4+/5 in right EHL.   Sensory: Sensory testing is intact to pinprick, soft touch and vibration sensation in legs and arms  Coordination: Cerebellar testing reveals good finger-nose-finger and heel-to-shin bilaterally.  Gait and station: Station is normal.   Gait is minimally wide and aarthritic Tandem gait is mildly wide. Romberg is negative.   Reflexes: Deep tendon reflexes are symmetric and increased bilaterally with  2 beats non-sustained clonus at ankles.        DIAGNOSTIC DATA (LABS, IMAGING, TESTING) - I reviewed patient records, labs, notes, testing and imaging myself where available.  I reviewed her LFT from 08/02/2015 ---  They were normal.       ASSESSMENT AND PLAN  Multiple sclerosis (HCC)  Dysesthesia  Gait disturbance  Primary osteoarthritis of right knee  1.   Continue Avonex for now.   We also discussed Plegridy, Tecfidera and Aubagio as possible options. The Plegridy would be at least as effective as Avonex behalf number of shots. Reasonable and efficacy.  These would allow her to take pills shot..   2.   She will follow up with ortho about her  knee.    3.   Lamotrigine for dysesthesia with  titration over a few weeks. If no better after 6-8 weeks, she should stop the medicine.  4.  Return to see me in 6 months or sooner if there are new or worsening neurologic symptoms.  Mikeya Tomasetti A. Felecia Shelling, MD, PhD [id number], [id number] AM Certified in Neurology, Clinical Neurophysiology, Sleep Medicine, Pain Medicine and Neuroimaging  [facility] [address] [place], [place] [address] [phone]

## 2016-02-17 ENCOUNTER — Ambulatory Visit: Payer: BLUE CROSS/BLUE SHIELD | Admitting: Neurology

## 2016-02-18 ENCOUNTER — Other Ambulatory Visit: Payer: Self-pay | Admitting: *Deleted

## 2016-02-18 MED ORDER — SIMVASTATIN 40 MG PO TABS: 40.0000 mg | ORAL_TABLET | Freq: Every day | ORAL | 0 refills | Status: DC

## 2016-02-18 MED ORDER — LOSARTAN POTASSIUM-HCTZ 100-12.5 MG PO TABS: ORAL_TABLET | ORAL | 0 refills | Status: DC

## 2016-03-07 ENCOUNTER — Telehealth: Payer: Self-pay | Admitting: Family Medicine

## 2016-03-07 ENCOUNTER — Other Ambulatory Visit: Payer: Self-pay | Admitting: Neurology

## 2016-03-07 NOTE — Telephone Encounter (Signed)
Review blood work results from [facility]. °

## 2016-03-09 ENCOUNTER — Other Ambulatory Visit: Payer: Self-pay | Admitting: *Deleted

## 2016-03-09 MED ORDER — SERTRALINE HCL 100 MG PO TABS: 100.0000 mg | ORAL_TABLET | Freq: Every day | ORAL | 0 refills | Status: DC

## 2016-03-15 ENCOUNTER — Encounter: Payer: Self-pay | Admitting: Family Medicine

## 2016-03-15 ENCOUNTER — Ambulatory Visit (INDEPENDENT_AMBULATORY_CARE_PROVIDER_SITE_OTHER): Payer: BLUE CROSS/BLUE SHIELD | Admitting: Family Medicine

## 2016-03-15 VITALS — BP 118/82 | Temp 98.8°F | Ht 64.0 in | Wt 184.2 lb

## 2016-03-15 DIAGNOSIS — Z8719 Personal history of other diseases of the digestive system: Secondary | ICD-10-CM

## 2016-03-15 DIAGNOSIS — K5732 Diverticulitis of large intestine without perforation or abscess without bleeding: Secondary | ICD-10-CM | POA: Diagnosis not present

## 2016-03-15 MED ORDER — HYDROCODONE-ACETAMINOPHEN 5-325 MG PO TABS: 1.0000 | ORAL_TABLET | Freq: Four times a day (QID) | ORAL | 0 refills | Status: DC | PRN

## 2016-03-15 MED ORDER — CIPROFLOXACIN HCL 500 MG PO TABS: 500.0000 mg | ORAL_TABLET | Freq: Two times a day (BID) | ORAL | 0 refills | Status: DC

## 2016-03-15 MED ORDER — ONDANSETRON 4 MG PO TBDP: 4.0000 mg | ORAL_TABLET | Freq: Four times a day (QID) | ORAL | 0 refills | Status: DC | PRN

## 2016-03-15 MED ORDER — METRONIDAZOLE 500 MG PO TABS: 500.0000 mg | ORAL_TABLET | Freq: Three times a day (TID) | ORAL | 0 refills | Status: DC

## 2016-03-15 NOTE — Progress Notes (Signed)
   Subjective:    Patient ID: Krystal Silva, female    DOB: 1956-01-14, 60 y.o.   MRN: [id number]  Abdominal Pain  This is a new problem. The current episode started in the past 7 days. The problem occurs intermittently. The problem has been unchanged. The pain is located in the generalized abdominal region. The pain is moderate. The quality of the pain is aching. Nothing aggravates the pain. The pain is relieved by nothing. She has tried nothing for the symptoms. The treatment provided no relief.    Pain a tinge and then started kicking  Feeling cold no fever or chills  Appetite downseemed  The pain was mil this weekend  Hx of sig colon diverticulitis, Review of emergency record reveals diverticulitis 2 in the past. Patient had colonoscopy 5 years ago. Was advised At that time 10  No meds today  Did not take ibuprofen  Take s meds for MS   Some rad to back  Review of Systems  Gastrointestinal: Positive for abdominal pain.       Objective:   Physical Exam  Alert vital stable slight malaise no acute abdominal distress lungs clear heart rare rhythm abdomen bowel sounds present discrete left lower quadrant tenderness no rebound no CVA tenderness      Assessment & Plan:  Impression 1 probable diverticulitis discussed at length. There is definitely a place for empirical treatment here with history of sigmoid diverticulitis 2 and onset very consistent with this. Plan Cipro twice a day 10 days Flagyl 3 times a day 10 days dietary measures discussed. Hydrocodone prescribed. GI referral recommended. Since 5 years since last assessment. May need another colonoscopy discussed with patient. 25 minutes spent most in discussion, warning signs also discussed WSL

## 2016-03-17 ENCOUNTER — Encounter: Payer: Self-pay | Admitting: Gastroenterology

## 2016-03-24 ENCOUNTER — Telehealth: Payer: Self-pay | Admitting: Family Medicine

## 2016-03-24 MED ORDER — CIPROFLOXACIN HCL 500 MG PO TABS: 500.0000 mg | ORAL_TABLET | Freq: Two times a day (BID) | ORAL | 0 refills | Status: DC

## 2016-03-24 NOTE — Telephone Encounter (Signed)
Patient was seen on 03/15/16 and given Cipro and Flagyl.  She was under the impression that the Cipro was supposed to be for 10 days and she was only given for 5 days.  She just wanted to verify.

## 2016-03-24 NOTE — Telephone Encounter (Signed)
Prescription sent electronically to pharmacy. Patient notified. 

## 2016-03-24 NOTE — Telephone Encounter (Signed)
Consult with Dr Richardson Landry: Give patient five more days worth of the Cipro to equal a 10 day course.

## 2016-04-01 ENCOUNTER — Ambulatory Visit (INDEPENDENT_AMBULATORY_CARE_PROVIDER_SITE_OTHER): Payer: BLUE CROSS/BLUE SHIELD

## 2016-04-01 ENCOUNTER — Ambulatory Visit (INDEPENDENT_AMBULATORY_CARE_PROVIDER_SITE_OTHER): Payer: BLUE CROSS/BLUE SHIELD | Admitting: Orthopedic Surgery

## 2016-04-01 DIAGNOSIS — Z9889 Other specified postprocedural states: Secondary | ICD-10-CM | POA: Diagnosis not present

## 2016-04-01 DIAGNOSIS — M1710 Unilateral primary osteoarthritis, unspecified knee: Secondary | ICD-10-CM | POA: Diagnosis not present

## 2016-04-01 NOTE — Progress Notes (Signed)
Patient ID: Krystal Silva, female   DOB: 08/15/1955, 60 y.o.   MRN: [id number]  Chief Complaint  Patient presents with  . Follow-up    YEARLY FOLLOW UP RT KNEE OA    HPI Krystal Silva is a 60 y.o. female.   HPI  60 year old female status post arthroscopy right knee and Synvisc injection. The surgery was done in November 2016. 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   Review of Systems Review of Systems  Primarily all complaints related to the knee complaints related to the back shoulder elbow   Physical Exam There were no vitals taken for this visit.   Physical Exam  Krystal Silva has normal appearance grooming and hygiene Oriented 3 Mood affect normal Gait slight antalgic gait favoring the right lower extremity  She has tenderness over the medial joint line. The knee remains stable. Muscle tone is normal.  X-rays today I read as varus alignment with significant arthritis of the medial compartment and bone to bone contact with subchondral sclerosis and cyst formation on the medial side. She has a centered patella but a lateral patellofemoral predominance and osteoarthritis is noted on both sides of patellofemoral joint on the axial x-ray  Encounter Diagnoses  Name Primary?  [name] Arthritis of knee Yes  . S/P right knee arthroscopy     Plan at this time is to continue with our current medication which is Aleve, she  takes that 1-2 times per week  She understands per today's conversation that she needs knee replacement surgery and that can be done when she can no longer stand the symptoms

## 2016-04-01 NOTE — Patient Instructions (Signed)
Call us

## 2016-04-04 ENCOUNTER — Ambulatory Visit: Payer: BLUE CROSS/BLUE SHIELD | Admitting: Orthopedic Surgery

## 2016-04-07 ENCOUNTER — Ambulatory Visit: Payer: BLUE CROSS/BLUE SHIELD | Admitting: Gastroenterology

## 2016-04-07 IMAGING — MG MM DIGITAL SCREENING
4 series · 4 of 4 positions shown · non-contrast
Comparison: Previous exam(s).

CLINICAL DATA: Screening.

EXAM:
DIGITAL SCREENING BILATERAL MAMMOGRAM WITH CAD

[L CC]
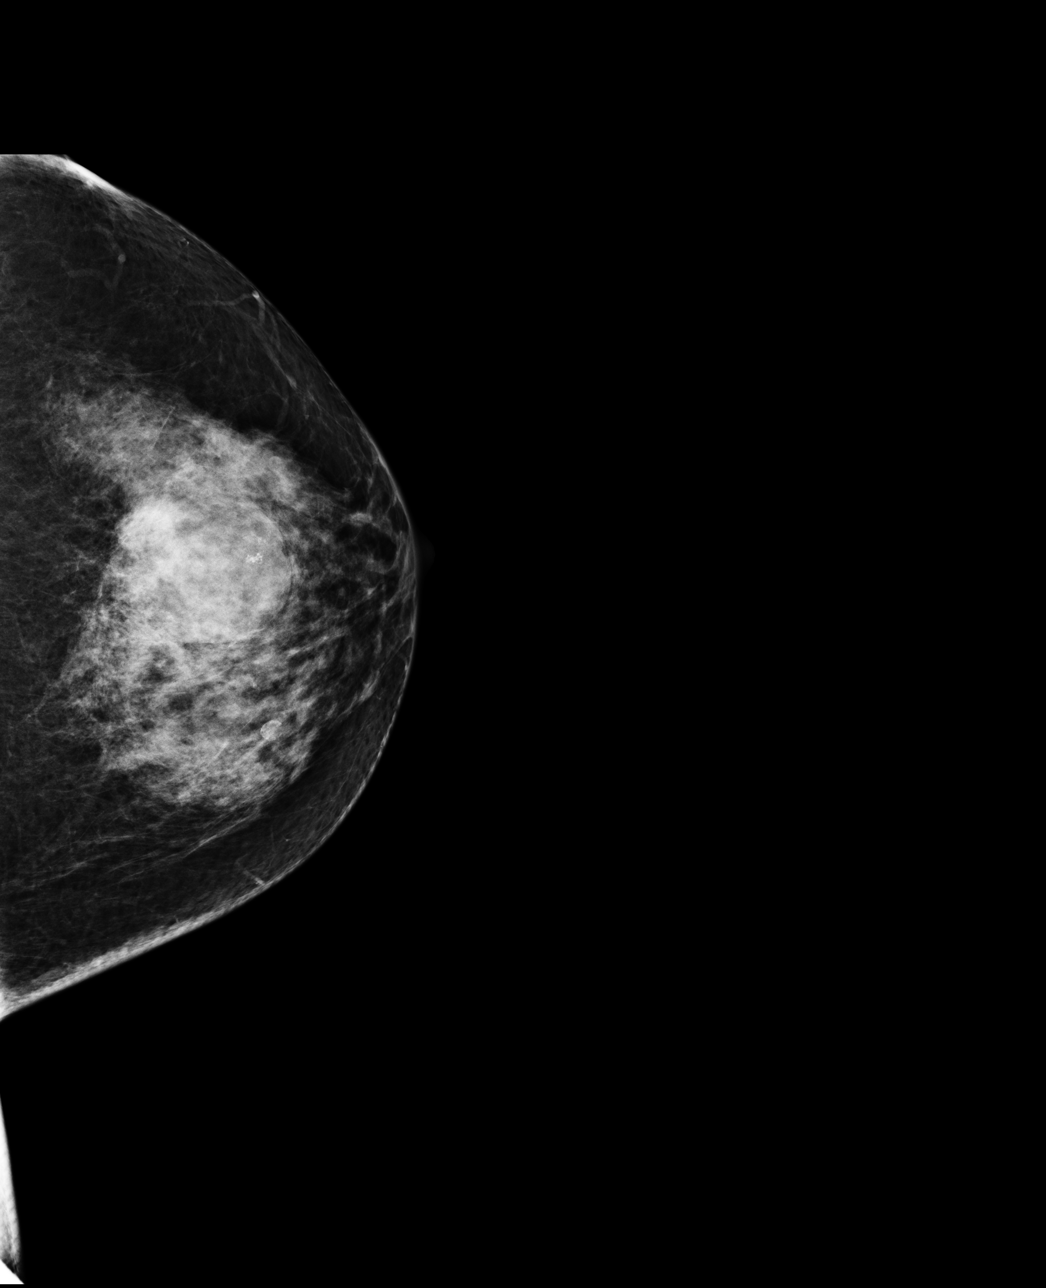

[L MLO]
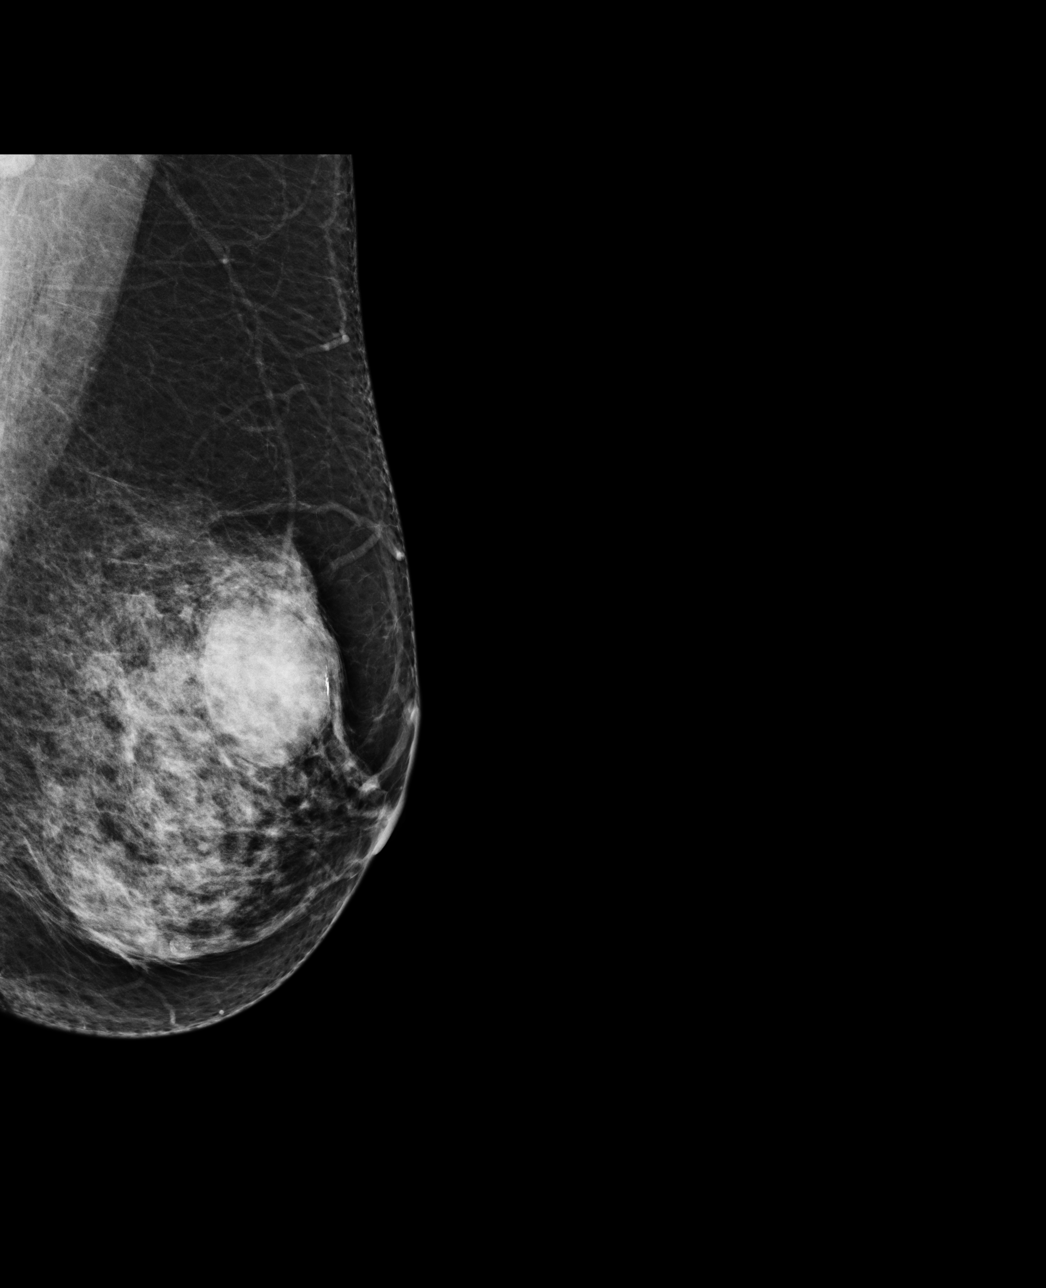

[R CC]
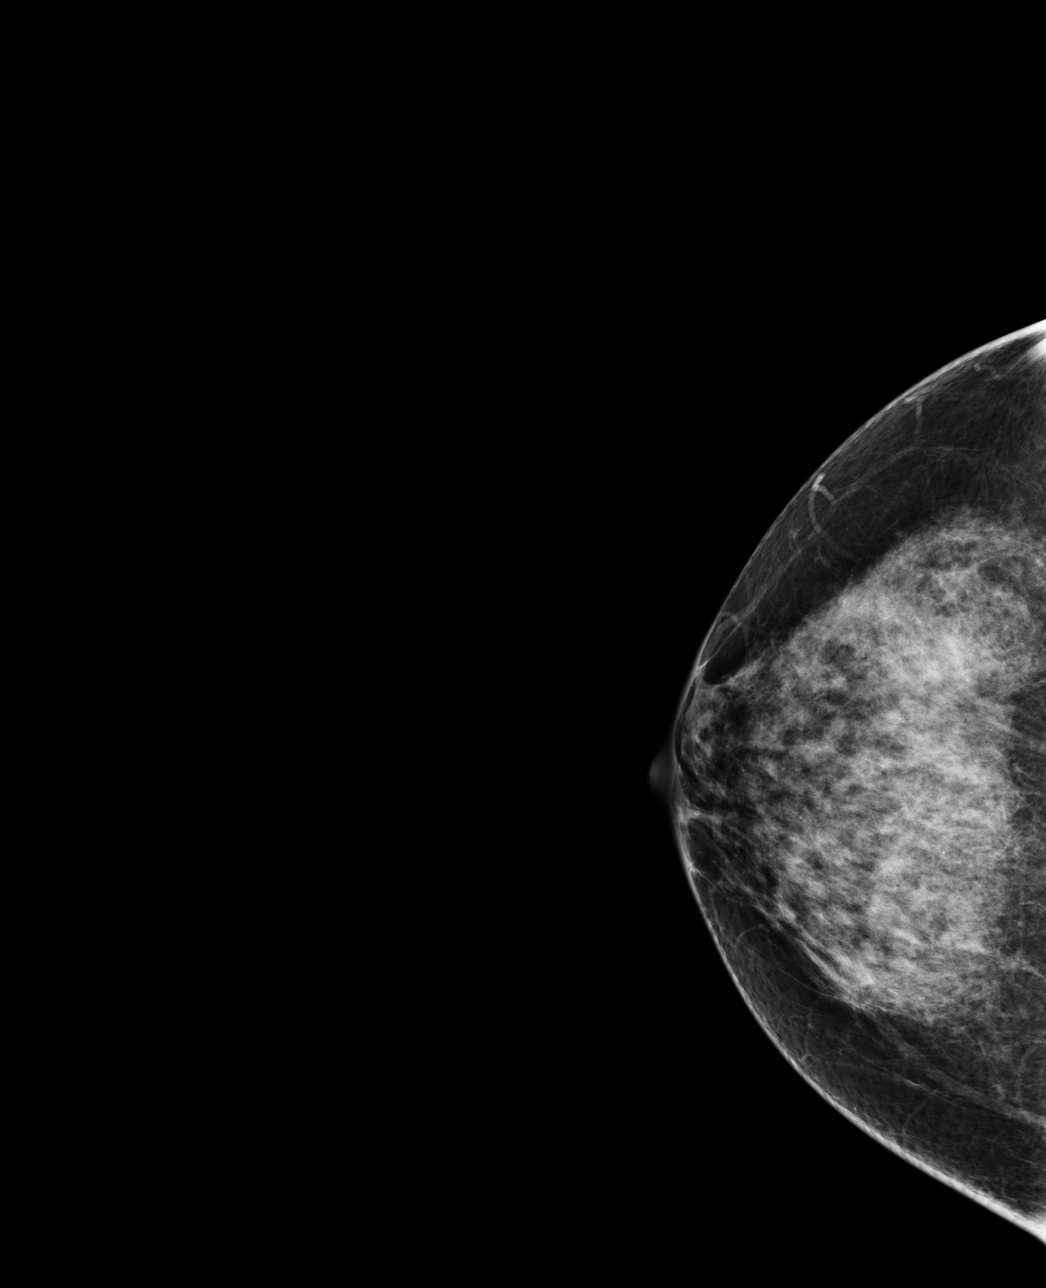

[R MLO]
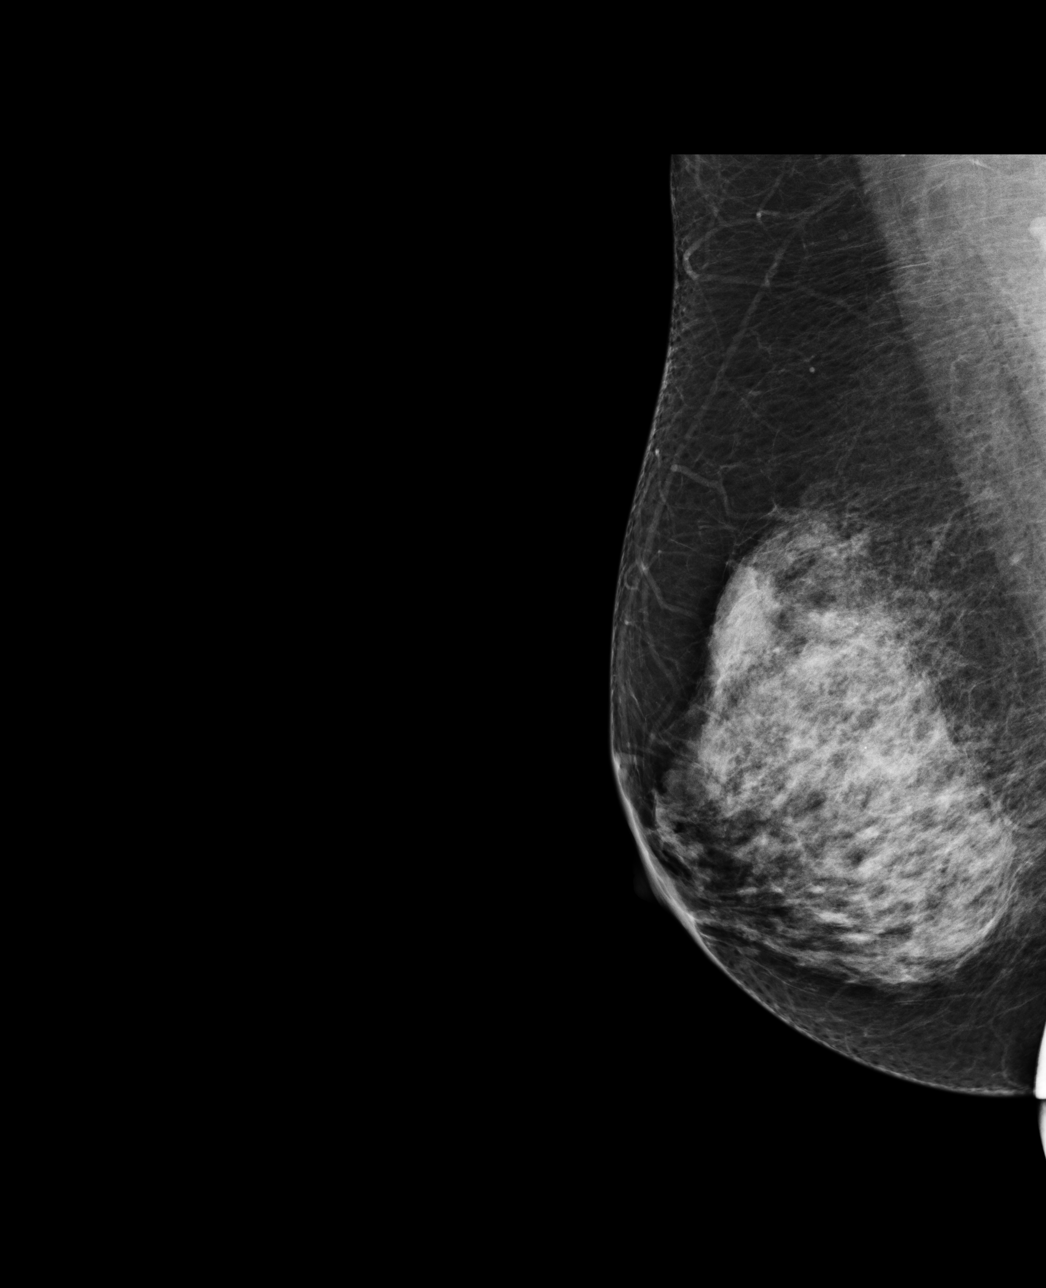

[4 of 4 positions shown; findings below may reference images not displayed]

ACR Breast Density Category d: The breast tissue is extremely dense,
which lowers the sensitivity of mammography.
FINDINGS: There are no findings suspicious for malignancy. Images were
processed with CAD.
IMPRESSION: No mammographic evidence of malignancy. A result letter of this
screening mammogram will be mailed directly to the patient.

RECOMMENDATION:
Screening mammogram in one year. (Code:[id number])

BI-RADS CATEGORY  1: Negative.

## 2016-04-21 ENCOUNTER — Encounter: Payer: Self-pay | Admitting: Gastroenterology

## 2016-04-21 ENCOUNTER — Ambulatory Visit (INDEPENDENT_AMBULATORY_CARE_PROVIDER_SITE_OTHER): Payer: BLUE CROSS/BLUE SHIELD | Admitting: Gastroenterology

## 2016-04-21 DIAGNOSIS — K5732 Diverticulitis of large intestine without perforation or abscess without bleeding: Secondary | ICD-10-CM | POA: Diagnosis not present

## 2016-04-21 DIAGNOSIS — K581 Irritable bowel syndrome with constipation: Secondary | ICD-10-CM | POA: Diagnosis not present

## 2016-04-21 NOTE — Patient Instructions (Signed)
Use  ibuprofen and naproxen as infrequent;y as possible. IT CAN INCREASE THE LIKELIHOOD OF DIVERTICULITIS OR BLEEDING FROM DIVERTICULOSIS.  CONTINUE YOUR PROBIOTIC DAILY.  TO MINIMIZE BOWEL IRREGULARITY, DRINK WATER TO KEEP YOUR URINE LIGHT YELLOW AND FOLLOW A LOW FODMAP/HIGH FIBER DIET. SEE HANDOUT.  FOLLOW UP IN 6 MOS. MERRY CHRISTMAS AND HAPPY NEW YEAR!  High-Fiber Diet A high-fiber diet changes your normal diet to include more whole grains, legumes, fruits, and vegetables. Changes in the diet involve replacing refined carbohydrates with unrefined foods. The calorie level of the diet is essentially unchanged. The Dietary Reference Intake (recommended amount) for adult males is 38 grams per day. For adult females, it is 25 grams per day. Pregnant and lactating women should consume 28 grams of fiber per day. Fiber is the intact part of a plant that is not broken down during digestion. Functional fiber is fiber that has been isolated from the plant to provide a beneficial effect in the body. PURPOSE  Increase stool bulk.   Ease and regulate bowel movements.   Lower cholesterol.  INDICATIONS THAT YOU NEED MORE FIBER  Constipation and hemorrhoids.   Uncomplicated diverticulosis (intestine condition) and irritable bowel syndrome.   Weight management.   As a protective measure against hardening of the arteries (atherosclerosis), diabetes, and cancer.   GUIDELINES FOR INCREASING FIBER IN THE DIET  Start adding fiber to the diet slowly. A gradual increase of about 5 more grams (2 slices of whole-wheat bread, 2 servings of most fruits or vegetables, or 1 bowl of high-fiber cereal) per day is best. Too rapid an increase in fiber may result in constipation, flatulence, and bloating.   Drink enough water and fluids to keep your urine clear or pale yellow. Water, juice, or caffeine-free drinks are recommended. Not drinking enough fluid may cause constipation.   Eat a variety of high-fiber  foods rather than one type of fiber.   Try to increase your intake of fiber through using high-fiber foods rather than fiber pills or supplements that contain small amounts of fiber.   The goal is to change the types of food eaten. Do not supplement your present diet with high-fiber foods, but replace foods in your present diet.  INCLUDE A VARIETY OF FIBER SOURCES  Replace refined and processed grains with whole grains, canned fruits with fresh fruits, and incorporate other fiber sources. White rice, white breads, and most bakery goods contain little or no fiber.   Brown whole-grain rice, buckwheat oats, and many fruits and vegetables are all good sources of fiber. These include: broccoli, Brussels sprouts, cabbage, cauliflower, beets, sweet potatoes, white potatoes (skin on), carrots, tomatoes, eggplant, squash, berries, fresh fruits, and dried fruits.   Cereals appear to be the richest source of fiber. Cereal fiber is found in whole grains and bran. Bran is the fiber-rich outer coat of cereal grain, which is largely removed in refining. In whole-grain cereals, the bran remains. In breakfast cereals, the largest amount of fiber is found in those with "bran" in their names. The fiber content is sometimes indicated on the label.   You may need to include additional fruits and vegetables each day.   In baking, for 1 cup white flour, you may use the following substitutions:   1 cup whole-wheat flour minus 2 tablespoons.   1/2 cup white flour plus 1/2 cup whole-wheat flour.

## 2016-04-21 NOTE — Progress Notes (Signed)
ON RECALL  °

## 2016-04-21 NOTE — Progress Notes (Signed)
cc'ed to pcp °

## 2016-04-21 NOTE — Progress Notes (Signed)
Subjective:    Patient ID: Krystal Silva, female    DOB: [birth year], 58 y.o.   MRN: [id number]  Mickie Hillier, MD  HPI TAKING CARE OF FATHER. AT [facility]. UNDER A LOT OF STRESS AT WORK AND WITH FATHER. LAST EPISODE 2011 x2-SIGMOID COLON DIVERTICULITIS. HAD KNEE SURGERY NOV 2016. MAY TAKE NSAIDS DUE TO HER MS(NAPROXEN OR IBUPROFEN BUT NOT TOGETHER). TOOK A DIFFERENT NSAID AND SAW BLOOD IN HER STOOL. AFTER THAT HAD A CREAM AND THEN GOT AN INJECTION AND IT HELPED SORT OF. IN OCT 2017 SX FOR 1-2 WEEKS PRIOR TO MD VISIT: STOOL CHANGES AND SMALL CALIBER(SKINNY), AND HAD PAIN ON LLQ JUST LIKE EPISODE IN 2011. NO CT OBTAINED AND PLACED ON CIP/FLAGYL AFTER VERY TENDER EXAM AND SYMPTOMS RESOLVED AND NOW HAS MILD TTP(TENDER) IN LLQ, NO RADIATION. HAD VOMITING x 1(NO BLOOD)(LAST TIME OCT 2017). MAY SEE SKINNY STOOL OFF AND ON SINCE OCT 2017, BUT STOOL STARTING TO GET BACK TO NORMAL. FELT CONSTIPATED ONE WEEK AGO.   PT DENIES FEVER, CHILLS, HEMATOCHEZIA, HEMATEMESIS, nausea, melena, diarrhea, CHEST PAIN, SHORTNESS OF BREATH, problems swallowing, OR heartburn or indigestion.  Past Medical History:  Diagnosis Date  . Allergy   . Diverticulitis   . Hypercholesteremia   . Hypertension   . Insomnia   . MS (multiple sclerosis) ([place])   . Vitamin D deficiency     Past Surgical History:  Procedure Laterality Date  . COLONOSCOPY  08/21/2009  . KNEE ARTHROSCOPY WITH MEDIAL MENISECTOMY Right 03/27/2015   Procedure: KNEE ARTHROSCOPY WITH PARTIAL MEDIAL MENISECTOMY AND MICROFRACTURE;  Surgeon: Carole Civil, MD;  Location: [facility];  Service: Orthopedics;  Laterality: Right;  . TONSILLECTOMY    . WISDOM TOOTH EXTRACTION  1980's    Allergies  Allergen Reactions  . Codeine   . Penicillins     REACTION: rash  . Sulfonamide Derivatives   . Sulfamethoxazole Rash   Current Outpatient Prescriptions  Medication Sig Dispense Refill  . baclofen (LIORESAL) 10 MG tablet TAKE 1 TABLET 3 TIMES A DAY AS NEEDED      . Cholecalciferol (VITAMIN D-3) 5000 UNITS TABS Take by mouth daily.    [name] ibuprofen (ADVIL,MOTRIN) 800 MG tablet TAKE 1 TABLET BY MOUTH 3 TIMES A DAY AS NEEDED FOR PAIN    . Interferon Beta-1a (AVONEX PEN) 30 MCG/0.5ML AJKT Inject 30 mcg into the muscle every 7 (seven) days.    [name] KLOR-CON M10 10 MEQ tablet TAKE 1 TABLET BY MOUTH EVERY DAY    . LACTOBACILLUS RHAMNOSUS, GG, PO Take 4 capsules by mouth 4 (four) times daily as needed.    [name] losartan-hydrochlorothiazide (HYZAAR) 100-12.5 MG tablet TAKE 1 TABLET BY MOUTH EVERY DAY FOR BLOOD PRESSURE    . Multiple Vitamin (MULTIVITAMIN) tablet Take 1 tablet by mouth daily.    [name] NAPROXEN PO Take by mouth BID    . Probiotic Product DAILY  Take by mouth.    . sertraline100 MG tablet Take 1 tablet (100 mg total) by mouth daily.    . simvastatin40 MG tablet Take 1 tablet (40 mg total) by mouth at bedtime.    . vitamin E 400 UNIT capsule Take 800 Units by mouth daily.     Review of Systems  PER HPI OTHERWISE ALL SYSTEMS ARE NEGATIVE.    Objective:   Physical Exam  Constitutional: She is oriented to person, place, and time. She appears well-developed and well-nourished. No distress.  HENT:  Head: Normocephalic and atraumatic.  Mouth/Throat: Oropharynx is clear  and moist. No oropharyngeal exudate.  Eyes: Pupils are equal, round, and reactive to light. No scleral icterus.  Neck: Normal range of motion. Neck supple.  Cardiovascular: Normal rate, regular rhythm and normal heart sounds.   Pulmonary/Chest: Effort normal and breath sounds normal. No respiratory distress.  Abdominal: Soft. Bowel sounds are normal. She exhibits no distension. There is no tenderness.  Musculoskeletal: She exhibits no edema.  Lymphadenopathy:    She has no cervical adenopathy.  Neurological: She is alert and oriented to person, place, and time.  NO  NEW FOCAL DEFICITS  Psychiatric: She has a normal mood and affect.  Vitals reviewed.     Assessment & Plan:

## 2016-04-21 NOTE — Assessment & Plan Note (Addendum)
SYMPTOMS FAIRLY WELL CONTROLLED IN SPITE OF STRESS WITH WORK AND CARING FOR FATHER.  PROBIOTIC DAILY. LOW FODMAP/HIGH FIBER DIET FOLLOW UP IN 6-12 MOS.

## 2016-04-21 NOTE — Assessment & Plan Note (Addendum)
2 DOCUMENTED EPISODES IN SIGMOID COLON FEB/AUG 2011 AND NOW WITH 3RD PRESUMED EPISODE IN SETTING OF NSAID USE.   EXPLAINED TO PT NSAIDS INCREASE RISK FOR DIVERTICULITIS. THIS APPEARS TO BE AN UNCOMPLICATED COURSE WIHICH HAS RESOLVED AND MAY HAVE MILD LINGERING SYMPTOMS DUE TO IBS.  PT WILL CALL WITH QUESTIONS OR CONCERNS. NO INDICATION FOR IMAGING/COLONOSCOPY AT THIS TIME DUE TO BENIGN PHYSICAL EXAM. CONTINUE TO MONITOR SYMPTOMS.

## 2016-05-09 ENCOUNTER — Telehealth: Payer: Self-pay | Admitting: *Deleted

## 2016-05-09 MED ORDER — BACLOFEN 10 MG PO TABS: ORAL_TABLET | ORAL | 3 refills | Status: DC

## 2016-05-09 NOTE — Telephone Encounter (Signed)
90 day rx. of Baclofen escribed to [facility] per faxed request/fim

## 2016-06-16 ENCOUNTER — Other Ambulatory Visit: Payer: Self-pay | Admitting: Family Medicine

## 2016-06-21 ENCOUNTER — Other Ambulatory Visit: Payer: Self-pay | Admitting: *Deleted

## 2016-06-21 MED ORDER — HYLAN G-F 20 48 MG/6ML IX SOSY: PREFILLED_SYRINGE | INTRA_ARTICULAR | 0 refills | Status: DC

## 2016-07-05 ENCOUNTER — Telehealth: Payer: Self-pay | Admitting: Orthopedic Surgery

## 2016-07-05 NOTE — Telephone Encounter (Signed)
Patient called asking if the medication has been ordered for her to get injections in her knee.  Please call and advise.

## 2016-07-08 ENCOUNTER — Telehealth: Payer: Self-pay | Admitting: Orthopedic Surgery

## 2016-07-08 NOTE — Telephone Encounter (Signed)
Medication has been ordered and pharmacy will call patient to schedule delivery

## 2016-07-08 NOTE — Telephone Encounter (Signed)
I called [employer] and spoke with [place], she said that they would reach out to the patient and get this scheduled with her.

## 2016-07-08 NOTE — Telephone Encounter (Signed)
Arby Barrette from [place] left message on voicemail that she needed to schedule shipment for this patient's Synvisc  [phone]  Opt 1 Please call and advise

## 2016-07-08 NOTE — Telephone Encounter (Signed)
Please advise they need to set up shipment with patient, we do not accept it at the office

## 2016-08-08 ENCOUNTER — Ambulatory Visit (INDEPENDENT_AMBULATORY_CARE_PROVIDER_SITE_OTHER): Payer: BLUE CROSS/BLUE SHIELD | Admitting: Orthopedic Surgery

## 2016-08-08 ENCOUNTER — Encounter: Payer: Self-pay | Admitting: Orthopedic Surgery

## 2016-08-08 VITALS — BP 125/84 | HR 70 | Temp 97.7°F

## 2016-08-08 DIAGNOSIS — M1710 Unilateral primary osteoarthritis, unspecified knee: Secondary | ICD-10-CM

## 2016-08-08 NOTE — Progress Notes (Signed)
Chief Complaint  Patient presents with  . Injections    synvisc injection Rt knee     2ND SERIES OF SYNVISC  Injection SYNvisc # 1/1  KNEE right   The patient has osteoarthritis of the knee. She has been on anti-inflammatories and had cortisone injection and has failed that conservative treatment.  The patient has been approved and wishes to try hyaluronic acid injection in the form SYNvisc  The right knee is prepped with alcohol and ethyl chloride  The injection is performed with a 30-NMMHW needle  No complications were noted  Appropriate instructions post injection were given   FU 6 WEEKS

## 2016-08-08 NOTE — Patient Instructions (Addendum)
You have received an injection into the joint. 15% of patients will have increased pain within the 24 hours postinjection.   This is transient and will go away.   We recommend that you use ice packs on the injection site for 20 minutes every 2 hours and extra strength Tylenol 2 tablets every 8 as needed until the pain resolves.  If you continue to have pain after taking the Tylenol and using the ice please call the office for further instructions.   Sodium Hyaluronate intra-articular injection What is this medicine? SODIUM HYALURONATE (SOE dee um hye al yoor ON ate) is used to treat pain in the knee due to osteoarthritis. This medicine may be used for other purposes; ask your health care provider or pharmacist if you have questions. COMMON BRAND NAME(S): Amvisc, DUROLANE, Euflexxa, GELSYN-3, Hyalgan, Monovisc, Orthovisc, Supartz, Supartz FX What should I tell my health care provider before I take this medicine? They need to know if you have any of these conditions: -bleeding disorders -glaucoma -infection in the knee joint -skin conditions or sensitivity -skin infection -an unusual allergic reaction to sodium hyaluronate, other medicines, foods, dyes, or preservatives. Different brands of sodium hyaluronate contain different allergens. Some may contain egg. Talk to your doctor about your allergies to make sure that you get the right product. -pregnant or trying to get pregnant -breast-feeding How should I use this medicine? This medicine is for injection into the knee joint. It is given by a health care professional in a hospital or clinic setting. Talk to your pediatrician regarding the use of this medicine in children. Special care may be needed. Overdosage: If you think you have taken too much of this medicine contact a poison control center or emergency room at once. NOTE: This medicine is only for you. Do not share this medicine with others. What if I miss a dose? This does not  apply. What may interact with this medicine? Interactions are not expected. This list may not describe all possible interactions. Give your health care provider a list of all the medicines, herbs, non-prescription drugs, or dietary supplements you use. Also tell them if you smoke, drink alcohol, or use illegal drugs. Some items may interact with your medicine. What should I watch for while using this medicine? Tell your doctor or healthcare professional if your symptoms do not start to get better or if they get worse. If receiving this medicine for osteoarthritis, limit your activity after you receive your injection. Avoid physical activity for 48 hours following your injection to keep your knee from swelling. Do not stand on your feet for more than 1 hour at a time during the first 48 hours following your injection. Ask your doctor or healthcare professional about when you can begin major physical activity again. What side effects may I notice from receiving this medicine? Side effects that you should report to your doctor or health care professional as soon as possible: -allergic reactions like skin rash, itching or hives, swelling of the face, lips, or tongue -dizziness -facial flushing -pain, tingling, numbness in the hands or feet -vision changes if received this medicine during eye surgery Side effects that usually do not require medical attention (report to your doctor or health care professional if they continue or are bothersome): -back pain -bruising at site where injected -chills -diarrhea -fever -headache -joint pain -joint stiffness -joint swelling -muscle cramps -muscle pain -nausea, vomiting -pain, redness, or irritation at site where injected -weak or tired This list may not  describe all possible side effects. Call your doctor for medical advice about side effects. You may report side effects to FDA at 1-800-FDA-1088. Where should I keep my medicine? This drug is given  in a hospital or clinic and will not be stored at home. NOTE: This sheet is a summary. It may not cover all possible information. If you have questions about this medicine, talk to your doctor, pharmacist, or health care provider.  2018 Elsevier/Gold Standard (2015-06-11 08:34:51)

## 2016-08-09 ENCOUNTER — Ambulatory Visit: Payer: BLUE CROSS/BLUE SHIELD | Admitting: Orthopedic Surgery

## 2016-08-10 ENCOUNTER — Other Ambulatory Visit: Payer: Self-pay | Admitting: Nurse Practitioner

## 2016-08-15 ENCOUNTER — Ambulatory Visit: Payer: BLUE CROSS/BLUE SHIELD | Admitting: Neurology

## 2016-08-16 ENCOUNTER — Ambulatory Visit: Payer: BLUE CROSS/BLUE SHIELD | Admitting: Neurology

## 2016-08-18 ENCOUNTER — Ambulatory Visit (INDEPENDENT_AMBULATORY_CARE_PROVIDER_SITE_OTHER): Payer: BLUE CROSS/BLUE SHIELD | Admitting: Neurology

## 2016-08-18 ENCOUNTER — Encounter: Payer: Self-pay | Admitting: Neurology

## 2016-08-18 VITALS — BP 130/80 | HR 92 | Resp 16 | Ht 64.0 in | Wt 184.5 lb

## 2016-08-18 DIAGNOSIS — F418 Other specified anxiety disorders: Secondary | ICD-10-CM

## 2016-08-18 DIAGNOSIS — G35 Multiple sclerosis: Secondary | ICD-10-CM | POA: Diagnosis not present

## 2016-08-18 DIAGNOSIS — R208 Other disturbances of skin sensation: Secondary | ICD-10-CM | POA: Diagnosis not present

## 2016-08-18 DIAGNOSIS — R269 Unspecified abnormalities of gait and mobility: Secondary | ICD-10-CM

## 2016-08-18 MED ORDER — IMIPRAMINE HCL 25 MG PO TABS: ORAL_TABLET | ORAL | 11 refills | Status: DC

## 2016-08-18 NOTE — Progress Notes (Signed)
[facility]  PATIENT: Krystal Silva [facility] DOB: [birth year]  REFERRING DOCTOR OR PCP:   Coralie Keens SOURCE: patient, records from initial Neurology group and MRI images on PACS/CD  _________________________________   HISTORICAL  CHIEF COMPLAINT:  Chief Complaint  Patient presents with  . Multiple Sclerosis    Sts. she continues to tolerate Avonex well.  Sts. fatigue is some worse. She did not start Lamictal due to fear of side effects.  She continues to see ortho for right knee./fim    HISTORY OF PRESENT ILLNESS:  Krystal Silva is a 61 year old woman with MS.     MS:   She is on Avonex with some tolerability issues with mild flulike reactions at 1 day after he each injection.   She also has noted some lumps under her skin from the injections, especially in the thighs.. She denies any recent exacerbation.    Gait/strength/sensation: She notes some difficulty with her gait but this is mostly due to her right knee. However, she does note that her balance is a little bit off at times.   She stumbles but no falls.   She denies any significant weakness in the arms or legs. She has dysesthetic pain in both lower legs.     We tried imipramine and gabapentin but they were poorly tolerated.      She never filled the lamotrigine as she was worried about side effects.  Bladder: She denies any significant problem with urinary function. Specifically, she does not have nocturia, frequency or hesitancy.  Vision: She denies any current MS related vision problem.  The diplopia she had in 2003 resolved.  She denies diplopia when tired or hot.  She has never had optic neuritis.  Fatigue/sleep: She feels more tired the past year than usual.   Fatigue is both cognitive and physical.  She feels she sleeps well at night.    She does not snore.     Mood/cognition:  She feels mood is a little worse and she is sometimes mildly  depressed.     She is on sertraline and tolerates it well.      She denies any significant difficulty with cognition. She has not noted problems with her memory.   She works full time.   Hearing: She wears hearing aids bilaterally.  She has a little bit of tinnitus   She has a family history of deafness.  Knee:   She had recent surgery for knee pain and instability and does not think the surgery helped so a TKR is being considered.     MS History:  In 2003, she had an episode of diplopia. An MRI was performed but she was not diagnosed with MS at that time. In 2006, in [place],  she presented with dysesthetic sensations in the legs, mostly around the shin. She had MRI's performed that were more consistent with MS. She also had a lumbar puncture and the CSF was consistent with MS. The CSF showed oligoclonal bands and IgG index of 1.37 (elevated). She started to see Dr. George Hugh and then Dr. Shelia Media in 2010/2011 after moving to the area.    An MRI performed in 2011 was reportedly very similar to the one from 2006. She has been on Avonex therapy since diagnosis.[name]  REVIEW OF SYSTEMS: Constitutional: No fevers, chills, sweats, or change in appetite Eyes: No visual changes, double vision, eye pain Ear, nose and throat: No hearing loss, ear pain, nasal congestion, sore throat Cardiovascular:  No chest pain, palpitations Respiratory: No shortness of breath at rest or with exertion.   No wheezes GastrointestinaI: No nausea, vomiting, diarrhea, abdominal pain, fecal incontinence Genitourinary: No dysuria, urinary retention or frequency.  No nocturia. Musculoskeletal: No neck pain, back pain Integumentary: No rash, pruritus, skin lesions Neurological: as above Psychiatric: No depression at this time.  No anxiety Endocrine: No palpitations, diaphoresis, change in appetite, change in weigh or increased thirst Hematologic/Lymphatic: No anemia, purpura, petechiae. Allergic/Immunologic: No itchy/runny  eyes, nasal congestion, recent allergic reactions, rashes  ALLERGIES: Allergies  Allergen Reactions  . Codeine   . Penicillins     REACTION: rash  . Sulfonamide Derivatives   . Sulfamethoxazole Rash    HOME MEDICATIONS:  Current Outpatient Prescriptions:  .  baclofen (LIORESAL) 10 MG tablet, Take one tablet 3 times daily as needed., Disp: 270 tablet, Rfl: 3 .  Cholecalciferol (VITAMIN D-3) 5000 UNITS TABS, Take by mouth daily., Disp: , Rfl:  .  Hylan 48 MG/6ML SOSY, Inject into right knee once, Disp: 1 Syringe, Rfl: 0 .  ibuprofen (ADVIL,MOTRIN) 800 MG tablet, TAKE 1 TABLET BY MOUTH 3 TIMES A DAY AS NEEDED FOR PAIN, Disp: 90 tablet, Rfl: 1 .  Interferon Beta-1a (AVONEX PEN) 30 MCG/0.5ML AJKT, Inject 30 mcg into the muscle every 7 (seven) days., Disp: 1 each, Rfl: 6 .  KLOR-CON M10 10 MEQ tablet, TAKE 1 TABLET BY MOUTH EVERY DAY, Disp: 30 tablet, Rfl: 5 .  LACTOBACILLUS RHAMNOSUS, GG, PO, Take 4 capsules by mouth 4 (four) times daily as needed., Disp: , Rfl:  .  losartan-hydrochlorothiazide (HYZAAR) 100-12.5 MG tablet, TAKE 1 TABLET BY MOUTH EVERY DAY FOR BLOOD PRESSURE, Disp: 90 tablet, Rfl: 0 .  Multiple Vitamin (MULTIVITAMIN) tablet, Take 1 tablet by mouth daily., Disp: , Rfl:  .  NAPROXEN PO, Take by mouth., Disp: , Rfl:  .  Probiotic Product (PROBIOTIC DAILY PO), Take by mouth., Disp: , Rfl:  .  sertraline (ZOLOFT) 100 MG tablet, TAKE 1 TABLET (100 MG TOTAL) BY MOUTH DAILY., Disp: 90 tablet, Rfl: 0 .  simvastatin (ZOCOR) 40 MG tablet, TAKE 1 TABLET (40 MG TOTAL) BY MOUTH AT BEDTIME., Disp: 90 tablet, Rfl: 0 .  vitamin E 400 UNIT capsule, Take 800 Units by mouth daily., Disp: , Rfl:  .  imipramine (TOFRANIL) 25 MG tablet, One or two at bedtime, Disp: 60 tablet, Rfl: 11  PAST MEDICAL HISTORY: Past Medical History:  Diagnosis Date  . Allergy   . Diverticulitis   . Hypercholesteremia   . Hypertension   . Insomnia   . MS (multiple sclerosis) ([place])   . Vitamin D deficiency      PAST SURGICAL HISTORY: Past Surgical History:  Procedure Laterality Date  . COLONOSCOPY  08/21/2009  . KNEE ARTHROSCOPY WITH MEDIAL MENISECTOMY Right 03/27/2015   Procedure: KNEE ARTHROSCOPY WITH PARTIAL MEDIAL MENISECTOMY AND MICROFRACTURE;  Surgeon: Carole Civil, MD;  Location: [facility];  Service: Orthopedics;  Laterality: Right;  . TONSILLECTOMY    . WISDOM TOOTH EXTRACTION  1980's    FAMILY HISTORY: Family History  Problem Relation Age of Onset  . Hypertension Mother   . Hyperlipidemia Father   . Hearing loss Father   . Hyperlipidemia Brother   . Diabetes Brother   . Vision loss Maternal Grandmother   . Vision loss Paternal Grandmother   . Cancer Other     breast    SOCIAL HISTORY:  Social History   Social History  . Marital status: Divorced  Spouse name: N/A  . Number of children: N/A  . Years of education: 24   Occupational History  . work     Social History Main Topics  . Smoking status: Former Smoker    Packs/day: 1.50    Years: 30.00    Types: Cigarettes    Start date: 12/26/1974    Quit date: 06/22/2004  . Smokeless tobacco: Never Used  . Alcohol use 0.6 oz/week    1 Cans of beer per week     Comment: rare social  . Drug use: No  . Sexual activity: Not Currently   Other Topics Concern  . Not on file   Social History Narrative   Coffee in the morning couple of cups    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[employer]     PHYSICAL EXAM  Vitals:   08/18/16 1005  BP: 130/80  Pulse: 92  Resp: 16  Weight: 184 lb 8 oz (83.7 kg)  Height: 5\' 4"  (1.626 m)    Body mass index is 31.67 kg/m.   General: The patient is well-developed and well-nourished and in no acute distress  Musculoskeletal:  Back is nontender  Neurologic Exam  Mental status: The patient is alert and oriented x 3 at the time of the examination. The patient has apparent normal recent and remote memory, with an apparently normal  attention span and concentration ability.   Speech is normal.  Cranial nerves: Extraocular movements are full.   Facial symmetry is present. She notes mild reduced facial sensation on the left. Facial strength is normal.  Trapezius and sternocleidomastoid strength is normal. No dysarthria is noted.  No obvious hearing deficits are noted.  Motor:  Muscle bulk is normal.   Tone is mildly increased in legs. Strength is  5 / 5 in all 4 extremities except 4+/5 in right EHL.   Sensory: Sensory testing is intact to pinprick, soft touch and vibration sensation in legs and arms  Coordination: Cerebellar testing reveals good finger-nose-finger and heel-to-shin bilaterally.  Gait and station: Station is normal.   Gait is minimally wide and aarthritic Tandem gait is mildly wide. Romberg is negative.   Reflexes: Deep tendon reflexes are symmetric and increased bilaterally with  2 beats non-sustained clonus at ankles.        DIAGNOSTIC DATA (LABS, IMAGING, TESTING) - I reviewed patient records, labs, notes, testing and imaging myself where available.    ASSESSMENT AND PLAN  Multiple sclerosis ([place])  Dysesthesia  Depression with anxiety  Gait disturbance  1.   Continue Avonex for now. Consider MRI to r/o subclinical progression at next visit 2.   Imipramine at night to help sleep and may add to the sertraline to help mood a bit more   3.   Lamotrigine for dysesthesia with titration over a few weeks. If no better after 6-8 weeks, she should stop the medicine.  4.  Return to see me in 6 months or sooner if there are new or worsening neurologic symptoms.  Richard A. Felecia Shelling, MD, PhD 09/25/3974, 73:41 AM Certified in Neurology, Clinical Neurophysiology, Sleep Medicine, Pain Medicine and Neuroimaging  [facility] [address] [place], [place] [address] [phone]

## 2016-08-23 ENCOUNTER — Encounter: Payer: Self-pay | Admitting: Gastroenterology

## 2016-08-24 ENCOUNTER — Ambulatory Visit (HOSPITAL_COMMUNITY): Payer: BLUE CROSS/BLUE SHIELD

## 2016-08-24 ENCOUNTER — Ambulatory Visit (HOSPITAL_COMMUNITY)
Admission: RE | Admit: 2016-08-24 | Discharge: 2016-08-24 | Disposition: A | Discharge status: Home / Self Care | Payer: BLUE CROSS/BLUE SHIELD | Source: Ambulatory Visit | Attending: Family Medicine | Admitting: Family Medicine

## 2016-08-24 DIAGNOSIS — Z1231 Encounter for screening mammogram for malignant neoplasm of breast: Secondary | ICD-10-CM

## 2016-08-24 DIAGNOSIS — R928 Other abnormal and inconclusive findings on diagnostic imaging of breast: Secondary | ICD-10-CM | POA: Diagnosis not present

## 2016-08-25 ENCOUNTER — Other Ambulatory Visit: Payer: Self-pay | Admitting: Family Medicine

## 2016-08-25 DIAGNOSIS — N6489 Other specified disorders of breast: Secondary | ICD-10-CM

## 2016-09-01 ENCOUNTER — Ambulatory Visit
Admission: RE | Admit: 2016-09-01 | Discharge: 2016-09-01 | Disposition: A | Discharge status: Home / Self Care | Payer: BLUE CROSS/BLUE SHIELD | Source: Ambulatory Visit | Attending: Family Medicine | Admitting: Family Medicine

## 2016-09-01 DIAGNOSIS — N6489 Other specified disorders of breast: Secondary | ICD-10-CM

## 2016-09-05 ENCOUNTER — Telehealth: Payer: Self-pay | Admitting: Family Medicine

## 2016-09-05 NOTE — Telephone Encounter (Signed)
Review blood work results in results folder. °

## 2016-09-06 ENCOUNTER — Telehealth: Payer: Self-pay | Admitting: Neurology

## 2016-09-06 NOTE — Telephone Encounter (Signed)
LMOM (identified vm) for pt. to call [facility] phone# [phone]

## 2016-09-06 NOTE — Telephone Encounter (Signed)
[facility] [phone] called said they have reached out to the patient 5 times, she is not responding to left messages for refill onInterferon Beta-1a (AVONEX PEN) 30 MCG/0.5ML AJKT. Said a rescue letter would be sent out.  FYI

## 2016-09-09 ENCOUNTER — Ambulatory Visit (INDEPENDENT_AMBULATORY_CARE_PROVIDER_SITE_OTHER): Payer: BLUE CROSS/BLUE SHIELD | Admitting: Nurse Practitioner

## 2016-09-09 ENCOUNTER — Encounter: Payer: Self-pay | Admitting: Nurse Practitioner

## 2016-09-09 VITALS — BP 130/86 | Temp 97.6°F | Ht 63.5 in | Wt 182.0 lb

## 2016-09-09 DIAGNOSIS — N6320 Unspecified lump in the left breast, unspecified quadrant: Secondary | ICD-10-CM

## 2016-09-09 DIAGNOSIS — Z0000 Encounter for general adult medical examination without abnormal findings: Secondary | ICD-10-CM

## 2016-09-10 ENCOUNTER — Encounter: Payer: Self-pay | Admitting: Nurse Practitioner

## 2016-09-10 DIAGNOSIS — N6320 Unspecified lump in the left breast, unspecified quadrant: Secondary | ICD-10-CM | POA: Insufficient documentation

## 2016-09-10 NOTE — Progress Notes (Signed)
   Subjective:    Patient ID: Krystal Silva, female    DOB: 1956/05/01, 61 y.o.   MRN: [id number]  HPI presents for her wellness exam. No vaginal bleeding or pelvic pain. No new sexual partners. Regular vision and dental exams. Limited activity due to chronic knee pain. Followed by orthopedics. Labs done through employer. Have been faxed to our office. Not available during office visit.     Review of Systems  Constitutional: Negative for activity change, appetite change and fatigue.  HENT: Negative for dental problem, ear pain, sinus pressure and sore throat.   Respiratory: Negative for cough, chest tightness, shortness of breath and wheezing.   Cardiovascular: Negative for chest pain.  Gastrointestinal: Negative for abdominal distention, abdominal pain, constipation, diarrhea, nausea and vomiting.  Genitourinary: Negative for difficulty urinating, dysuria, enuresis, frequency, genital sores, menstrual problem, pelvic pain, urgency and vaginal discharge.       Objective:   Physical Exam  Constitutional: She is oriented to person, place, and time. She appears well-developed. No distress.  HENT:  Right Ear: External ear normal.  Left Ear: External ear normal.  Mouth/Throat: Oropharynx is clear and moist.  Neck: Normal range of motion. Neck supple. No tracheal deviation present. No thyromegaly present.  Cardiovascular: Normal rate, regular rhythm and normal heart sounds.  Exam reveals no gallop.   No murmur heard. Pulmonary/Chest: Effort normal and breath sounds normal.  Abdominal: Soft. She exhibits no distension. There is no tenderness.  Genitourinary: Vagina normal and uterus normal. No vaginal discharge found.  Genitourinary Comments: External GU: no rashes or lesions. Vagina: no discharge. Bimanual exam: no CMT; no tenderness or obvious masses.   Musculoskeletal: She exhibits no edema.  Lymphadenopathy:    She has no cervical adenopathy.  Neurological: She is alert and oriented to  person, place, and time.  Skin: Skin is warm and dry. No rash noted.  Psychiatric: She has a normal mood and affect. Her behavior is normal.  Vitals reviewed. Breast exam: dense tissue; 2 cm mass noted at one o'clock left breast approx 1-2 cm from areola. Axillae no adenopathy. Patient states mass has been there for years with no change. Defers further examination. Mammogram 08/24/16 was normal.        Assessment & Plan:   Problem List Items Addressed This Visit      Other   Breast mass, left    Other Visit Diagnoses    Routine general medical examination at a health care facility    -  Primary     Recheck if any changes in the left breast or if she wants further work up. Encouraged activity as tolerated and daily vitamin D/calcium.  Return in about 6 months (around 03/11/2017) for routine follow up.

## 2016-09-12 ENCOUNTER — Encounter: Payer: Self-pay | Admitting: Nurse Practitioner

## 2016-09-12 DIAGNOSIS — E559 Vitamin D deficiency, unspecified: Secondary | ICD-10-CM | POA: Insufficient documentation

## 2016-09-13 ENCOUNTER — Other Ambulatory Visit: Payer: Self-pay | Admitting: Family Medicine

## 2016-09-19 ENCOUNTER — Encounter: Payer: Self-pay | Admitting: Orthopedic Surgery

## 2016-09-19 ENCOUNTER — Ambulatory Visit (INDEPENDENT_AMBULATORY_CARE_PROVIDER_SITE_OTHER): Payer: BLUE CROSS/BLUE SHIELD | Admitting: Orthopedic Surgery

## 2016-09-19 DIAGNOSIS — M1710 Unilateral primary osteoarthritis, unspecified knee: Secondary | ICD-10-CM | POA: Diagnosis not present

## 2016-09-19 NOTE — Progress Notes (Signed)
Patient ID: Krystal Silva, female   DOB: [birth year], 61 y.o.   MRN: [id number]  Chief Complaint  Patient presents with  . Follow-up    right knee s/p monovisc    61 year old female status post knee arthroscopy as well as monovisc injections 2 she's been on anti-inflammatory medication she comes in after monovisc injection right knee and she still having pain. Pain is over the medial joint line it's at rest as well as increased with activity and it does prevent her from doing normal activities such as walking and hiking which she enjoys  We discussed the potential for total knee replacement area she would like to do this in the fall of the year so we gave her our total knee pamphlet and information regarding total knee replacement and have asked her to call us when she is ready    ROS    There were no vitals taken for this visit.  Ortho Exam   A/P  Medical decision-making  10 minute discussion on RT TKA   Arther Abbott, MD 09/19/2016 8:32 AM

## 2016-09-19 NOTE — Patient Instructions (Addendum)
Total Knee Replacement Total knee replacement is a surgery to replace your knee joint with a man-made (prosthetic) joint. The man-made joint is called a prosthesis. It replaces parts of the thigh bone (femur), lower leg bone (tibia), and kneecap (patella). This surgery is done to lessen pain and improve knee movement. What happens before the procedure?  Ask your doctor about: ? Changing or stopping your normal medicines. This is especially important if you take diabetes medicines or blood thinners. ? Taking medicines such as aspirin and ibuprofen. These medicines can thin your blood. Do not take these medicines before your procedure if your doctor tells you not to.  Get all dental care that you need done before your procedure. Plan to not have dental work done for 3 months after your surgery.  Follow instructions from your doctor about what you cannot eat or drink.  Ask your doctor how your surgical site will be marked or identified.  You may be given antibiotic medicine to help prevent infection.  If your doctor prescribes physical therapy, do exercises as told.  Do not use any tobacco products, such as cigarettes, chewing tobacco, or e-cigarettes. If you need help quitting, ask your doctor.  You may have a physical exam.  You may have tests, such as: ? X-rays. ? MRI. ? CT scan. ? Bone scans.  You may have a blood or urine sample taken.  Plan to have someone take you home after the procedure.  If you will be going home right after the procedure, plan to have someone with you for at least 24 hours. It is best to have someone help care for you for at least 4-6 weeks after surgery. What happens during the procedure?  To reduce your risk of infection: ? Your health care team will wash or sanitize their hands. ? Your skin will be washed with soap.  An IV tube will be put into one of your veins.  You will be given one or more of the following: ? Sedative. This is a medicine that  makes you relaxed. ? Local anesthetic. This is a medicine to numb the area. ? General anesthetic. This is a medicine that makes you fall asleep. ? Spinal anesthetic. This is a medicine that numbs your body below the waist. ? Regional anesthetic. This is a medicine that numbs everything below the injection site.  A cut (incision) will be made in your knee.  Damaged parts of your thigh bone, lower leg bone, and kneecap will be removed.  A piece of metal (liner) will be placed on your thigh bone. Pieces of plastic will be placed on your lower leg bone and the underside of your kneecap.  One or more small tubes (drains) may be placed near your cut to help drain fluid.  Your cut will be closed with stitches (sutures), skin glue, or skin tape (adhesive) strips. Medicine may be put on your cut.  A bandage (dressing) will be placed over your cut. The procedure may vary among doctors and hospitals. What happens after the procedure?  Your blood pressure, heart rate, breathing rate, and blood oxygen level will be monitored often until the medicines you were given have worn off.  You may continue to get fluids and medicines through an IV tube.  You will have some pain. There will be medicines to help you.  You may have fluid coming from a drain.  You may have to wear special socks (compression stockings). These help to prevent blood clots and   reduce swelling in your legs.  You will be told to move around as much as possible.  You may be given a continuous passive motion machine to use at home. You will be shown how to use this machine.  Do not drive for 24 hours if you received a sedative. This information is not intended to replace advice given to you by your health care provider. Make sure you discuss any questions you have with your health care provider. Document Released: 08/01/2011 Document Revised: 01/11/2016 Document Reviewed: 04/15/2015 Elsevier Interactive Patient Education  2017  Elsevier Inc.  

## 2016-09-21 ENCOUNTER — Other Ambulatory Visit: Payer: Self-pay | Admitting: Neurology

## 2016-12-08 ENCOUNTER — Telehealth: Payer: Self-pay | Admitting: Neurology

## 2016-12-08 ENCOUNTER — Telehealth: Payer: Self-pay | Admitting: *Deleted

## 2016-12-08 NOTE — Telephone Encounter (Signed)
PA for Avonex 39mcg IM Q7days completed and faxed to [facility]/fim

## 2016-12-09 ENCOUNTER — Telehealth: Payer: Self-pay | Admitting: Neurology

## 2016-12-09 MED ORDER — INTERFERON BETA-1A 30 MCG/0.5ML IM AJKT: 30.0000 ug | AUTO-INJECTOR | INTRAMUSCULAR | 2 refills | Status: DC

## 2016-12-09 NOTE — Telephone Encounter (Signed)
Avonex rx. escribed to [facility]/fim

## 2016-12-09 NOTE — Addendum Note (Signed)
Addended by: France Ravens I on: 12/09/2016 10:27 AM   Modules accepted: Orders

## 2016-12-09 NOTE — Telephone Encounter (Signed)
Krystal Silva from [facility] called to inform The prior Josem Kaufmann was approved for AVONEX PEN 30 MCG/0.5ML AJKT but out of network , needs to go to [facility] their phone# [phone] their fax [phone]

## 2016-12-11 ENCOUNTER — Other Ambulatory Visit: Payer: Self-pay | Admitting: Family Medicine

## 2016-12-12 NOTE — Telephone Encounter (Signed)
Fax received from [facility], phone# [phone].   Avonex PA approved thru 12/08/21.  [id number].  Pt. ID# [id number]

## 2017-02-22 ENCOUNTER — Ambulatory Visit (INDEPENDENT_AMBULATORY_CARE_PROVIDER_SITE_OTHER): Payer: Commercial Managed Care - PPO | Admitting: Neurology

## 2017-02-22 ENCOUNTER — Encounter: Payer: Self-pay | Admitting: Neurology

## 2017-02-22 ENCOUNTER — Other Ambulatory Visit: Payer: Self-pay | Admitting: Family Medicine

## 2017-02-22 VITALS — BP 145/76 | HR 82 | Resp 18 | Ht 63.5 in | Wt 182.5 lb

## 2017-02-22 DIAGNOSIS — R269 Unspecified abnormalities of gait and mobility: Secondary | ICD-10-CM | POA: Diagnosis not present

## 2017-02-22 DIAGNOSIS — J302 Other seasonal allergic rhinitis: Secondary | ICD-10-CM | POA: Insufficient documentation

## 2017-02-22 DIAGNOSIS — R208 Other disturbances of skin sensation: Secondary | ICD-10-CM

## 2017-02-22 DIAGNOSIS — G35 Multiple sclerosis: Secondary | ICD-10-CM

## 2017-02-22 NOTE — Progress Notes (Signed)
[facility]  PATIENT: Krystal Silva [facility] DOB: Aug 11, 1955  REFERRING DOCTOR OR PCP:   Coralie Keens SOURCE: patient, records from initial Neurology group and MRI images on PACS/CD  _________________________________   HISTORICAL  CHIEF COMPLAINT:  Chief Complaint  Patient presents with  . Multiple Sclerosis    Sts. she tolerates Avonex well but is experiencing inj. site fatigue. She is fearful of gi side effects that may come with oral meds/fim    HISTORY OF PRESENT ILLNESS:  Krystal Silva is a 61 year old woman with MS.     Update 02/22/2017:    She is on Avonex and denies any exacerbation.    She finds it hard to find less painful injection sites and is starting to get tired of giving herself shots.   However, because her MS is stable, she is reluctant to change.    Her gait is off, possibly due to right knee arthritis.   She has some leg dysesthestic pain, better with imipramine.    Bladder function is well. Vision is doing well.  She has a lot of fatigue and this is worse with heat.     She sleeps better with imipramine.   Mood is better with sertraline and imipramine.   Cognition is doing well.     She gets a lot of allergies in the fall and that makes her sleepier.   She takes Benadryl at bedtime.  Sometimes she takes a daytime Zyrtec.[name]   Her right knee is still bothering her. She thinks she will need to have a total knee replacement soon.  She takes vit D daily.   The last level (08/2016) was 43.7.     __________________________ From 08/18/2016  MS:   She is on Avonex with some tolerability issues with mild flulike reactions at 1 day after he each injection.   She also has noted some lumps under her skin from the injections, especially in the thighs.. She denies any recent exacerbation.    Gait/strength/sensation: She notes some difficulty with her gait but this is mostly due to her right knee.  However, she does note that her balance is a little bit off at times.   She stumbles but no falls.   She denies any significant weakness in the arms or legs. She has dysesthetic pain in both lower legs.     We tried imipramine and gabapentin but they were poorly tolerated.      She never filled the lamotrigine as she was worried about side effects.  Bladder: She denies any significant problem with urinary function. Specifically, she does not have nocturia, frequency or hesitancy.  Vision: She denies any current MS related vision problem.  The diplopia she had in 2003 resolved.  She denies diplopia when tired or hot.  She has never had optic neuritis.  Fatigue/sleep: She feels more tired the past year than usual.   Fatigue is both cognitive and physical.  She feels she sleeps well at night.    She does not snore.     Mood/cognition:  She feels mood is a little worse and she is sometimes mildly depressed.     She is on sertraline and tolerates it well.      She denies any significant difficulty with cognition. She has not noted problems with her memory.   She works full time.   Hearing: She wears hearing aids bilaterally.  She has a little bit of tinnitus   She has a  family history of deafness.  Knee:   She had recent surgery for knee pain and instability and does not think the surgery helped so a TKR is being considered.     MS History:  In 2003, she had an episode of diplopia. An MRI was performed but she was not diagnosed with MS at that time. In 2006, in [place],  she presented with dysesthetic sensations in the legs, mostly around the shin. She had MRI's performed that were more consistent with MS. She also had a lumbar puncture and the CSF was consistent with MS. The CSF showed oligoclonal bands and IgG index of 1.37 (elevated). She started to see Dr. George Hugh and then Dr. Shelia Media in 2010/2011 after moving to the area.    An MRI performed in 2011 was reportedly very similar to the one from 2006.  She has been on Avonex therapy since diagnosis.[name]  REVIEW OF SYSTEMS: Constitutional: No fevers, chills, sweats, or change in appetite Eyes: No visual changes, double vision, eye pain Ear, nose and throat: No hearing loss, ear pain, nasal congestion, sore throat Cardiovascular: No chest pain, palpitations Respiratory: No shortness of breath at rest or with exertion.   No wheezes GastrointestinaI: No nausea, vomiting, diarrhea, abdominal pain, fecal incontinence Genitourinary: No dysuria, urinary retention or frequency.  No nocturia. Musculoskeletal: No neck pain, back pain Integumentary: No rash, pruritus, skin lesions Neurological: as above Psychiatric: No depression at this time.  No anxiety Endocrine: No palpitations, diaphoresis, change in appetite, change in weigh or increased thirst Hematologic/Lymphatic: No anemia, purpura, petechiae. Allergic/Immunologic: No itchy/runny eyes, nasal congestion, recent allergic reactions, rashes  ALLERGIES: Allergies  Allergen Reactions  . Codeine   . Penicillins     REACTION: rash  . Sulfonamide Derivatives   . Sulfamethoxazole Rash    HOME MEDICATIONS:  Current Outpatient Prescriptions:  .  baclofen (LIORESAL) 10 MG tablet, Take one tablet 3 times daily as needed., Disp: 270 tablet, Rfl: 3 .  Cholecalciferol (VITAMIN D-3) 5000 UNITS TABS, Take by mouth daily., Disp: , Rfl:  .  ibuprofen (ADVIL,MOTRIN) 800 MG tablet, TAKE 1 TABLET BY MOUTH 3 TIMES A DAY AS NEEDED FOR PAIN, Disp: 90 tablet, Rfl: 1 .  imipramine (TOFRANIL) 25 MG tablet, One or two at bedtime, Disp: 60 tablet, Rfl: 11 .  Interferon Beta-1a (AVONEX PEN) 30 MCG/0.5ML AJKT, Inject 30 mcg into the muscle every 7 (seven) days., Disp: 1 each, Rfl: 2 .  KLOR-CON M10 10 MEQ tablet, TAKE 1 TABLET BY MOUTH EVERY DAY, Disp: 30 tablet, Rfl: 5 .  LACTOBACILLUS RHAMNOSUS, GG, PO, Take 4 capsules by mouth 4 (four) times daily as needed., Disp: , Rfl:  .  losartan-hydrochlorothiazide  (HYZAAR) 100-12.5 MG tablet, TAKE 1 TABLET BY MOUTH EVERY DAY FOR BLOOD PRESSURE, Disp: 90 tablet, Rfl: 0 .  Multiple Vitamin (MULTIVITAMIN) tablet, Take 1 tablet by mouth daily., Disp: , Rfl:  .  NAPROXEN PO, Take by mouth., Disp: , Rfl:  .  Probiotic Product (PROBIOTIC DAILY PO), Take by mouth., Disp: , Rfl:  .  sertraline (ZOLOFT) 100 MG tablet, TAKE 1 TABLET (100 MG TOTAL) BY MOUTH DAILY., Disp: 90 tablet, Rfl: 0 .  simvastatin (ZOCOR) 40 MG tablet, TAKE 1 TABLET (40 MG TOTAL) BY MOUTH AT BEDTIME., Disp: 90 tablet, Rfl: 0 .  vitamin E 400 UNIT capsule, Take 800 Units by mouth daily., Disp: , Rfl:   PAST MEDICAL HISTORY: Past Medical History:  Diagnosis Date  . Allergy   . Diverticulitis   .  Hypercholesteremia   . Hypertension   . Insomnia   . MS (multiple sclerosis) ([place])   . Vitamin D deficiency     PAST SURGICAL HISTORY: Past Surgical History:  Procedure Laterality Date  . COLONOSCOPY  08/21/2009  . KNEE ARTHROSCOPY WITH MEDIAL MENISECTOMY Right 03/27/2015   Procedure: KNEE ARTHROSCOPY WITH PARTIAL MEDIAL MENISECTOMY AND MICROFRACTURE;  Surgeon: Carole Civil, MD;  Location: [facility];  Service: Orthopedics;  Laterality: Right;  . TONSILLECTOMY    . WISDOM TOOTH EXTRACTION  1980's    FAMILY HISTORY: Family History  Problem Relation Age of Onset  . Hypertension Mother   . Hyperlipidemia Father   . Hearing loss Father   . Hyperlipidemia Brother   . Diabetes Brother   . Vision loss Maternal Grandmother   . Vision loss Paternal Grandmother   . Cancer Other        breast    SOCIAL HISTORY:  Social History   Social History  . Marital status: Divorced    Spouse name: N/A  . Number of children: N/A  . Years of education: 28   Occupational History  . work     Social History Main Topics  . Smoking status: Former Smoker    Packs/day: 1.50    Years: 30.00    Types: Cigarettes    Start date: 12/26/1974    Quit date: 06/22/2004  . Smokeless tobacco: Never Used    . Alcohol use 0.6 oz/week    1 Cans of beer per week     Comment: rare social  . Drug use: No  . Sexual activity: Not Currently   Other Topics Concern  . Not on file   Social History Narrative   Coffee in the morning couple of cups    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[employer]     PHYSICAL EXAM  Vitals:   02/22/17 0953  BP: (!) 145/76  Pulse: 82  Resp: 18  Weight: 182 lb 8 oz (82.8 kg)  Height: 5' 3.5" (1.613 m)    Body mass index is 31.82 kg/m.   General: The patient is well-developed and well-nourished and in no acute distress  Musculoskeletal:  Back is nontender  Neurologic Exam  Mental status: The patient is alert and oriented x 3 at the time of the examination. The patient has apparent normal recent and remote memory, with an apparently normal attention span and concentration ability.   Speech is normal.  Cranial nerves: Extraocular movements are full.   Facial strength and sensation is normal. Trapezius strength is normal.. No dysarthria is noted.  No obvious hearing deficits are noted.  Motor:  Muscle bulk is normal.   Tone is mildly increased in legs. Strength is  5 / 5 in all 4 extremities except 4+/5 in right EHL.   Sensory: Sensory testing intact to touch and vibration in the arms and legs.  Coordination: Cerebellar testing reveals good finger-nose-finger and heel-to-shin bilaterally.  Gait and station: Station is normal.   The gait is fairly normal but favors the arthritic right leg. Her tandem gait is mildly wide.. Romberg is negative.   Reflexes: Deep tendon reflexes are symmetric and increased bilaterally with  2 beats non-sustained clonus at ankles.        DIAGNOSTIC DATA (LABS, IMAGING, TESTING) - I reviewed patient records, labs, notes, testing and imaging myself where available.    ASSESSMENT AND PLAN  Dysesthesia  Multiple sclerosis ([place]) - Plan:  MR BRAIN W WO CONTRAST  Gait  disturbance  Seasonal allergies  1.   She will continue Avonex. We will check an MRI of the brain to determine if she is having any subclinical progression. If this is seen, we will need to reconsider a different disease modifying therapy. We have discussed the oral agents   2.   Continue imipramine at night to help sleep and dysesthestic pain   3.   Return to see me in 6 months or sooner if there are new or worsening neurologic symptoms.  Richard A. Felecia Shelling, MD, PhD 06/23/1153, 20:80 AM Certified in Neurology, Clinical Neurophysiology, Sleep Medicine, Pain Medicine and Neuroimaging  [facility] [address], [place], [place] [address] [phone]

## 2017-02-24 NOTE — Telephone Encounter (Signed)
ERROR

## 2017-03-10 ENCOUNTER — Encounter: Payer: Self-pay | Admitting: Nurse Practitioner

## 2017-03-10 ENCOUNTER — Ambulatory Visit (INDEPENDENT_AMBULATORY_CARE_PROVIDER_SITE_OTHER): Payer: Commercial Managed Care - PPO | Admitting: Nurse Practitioner

## 2017-03-10 ENCOUNTER — Other Ambulatory Visit: Payer: Self-pay | Admitting: Family Medicine

## 2017-03-10 VITALS — BP 132/84 | Ht 63.5 in | Wt 182.0 lb

## 2017-03-10 DIAGNOSIS — E781 Pure hyperglyceridemia: Secondary | ICD-10-CM

## 2017-03-10 DIAGNOSIS — I10 Essential (primary) hypertension: Secondary | ICD-10-CM

## 2017-03-10 DIAGNOSIS — F418 Other specified anxiety disorders: Secondary | ICD-10-CM | POA: Diagnosis not present

## 2017-03-10 MED ORDER — LOSARTAN POTASSIUM-HCTZ 100-12.5 MG PO TABS: ORAL_TABLET | ORAL | 1 refills | Status: DC

## 2017-03-10 NOTE — Progress Notes (Signed)
Subjective: Presents for routine follow-up on her hypertension.  Compliant with medications.  Activity has been somewhat limited due to her MS but does have an active job.  Has been trying to eat healthy.  Depression and anxiety stable on Zoloft.  Uses imipramine as prescribed by her specialist to help her sleep at nighttime.  No chest pain/ischemic type pain or shortness of breath.  No edema.  No TIA symptoms.  Objective:   BP 132/84   Ht 5' 3.5" (1.613 m)   Wt 182 lb (82.6 kg)   BMI 31.73 kg/m  NAD.  Alert, oriented.  Lungs clear.  Heart regular rate and rhythm.  Carotids no bruits or thrills.  Lower extremities no edema.  Cheerful affect.  Thoughts logical coherent and relevant.  Dressed appropriately.  Making good eye contact.  Assessment:   Problem List Items Addressed This Visit      Cardiovascular and Mediastinum   Essential hypertension, benign - Primary (Chronic)   Relevant Medications   losartan-hydrochlorothiazide (HYZAAR) 100-12.5 MG tablet     Other   Depression with anxiety   Hypertriglyceridemia   Relevant Medications   losartan-hydrochlorothiazide (HYZAAR) 100-12.5 MG tablet       Plan:   Meds ordered this encounter  Medications  . losartan-hydrochlorothiazide (HYZAAR) 100-12.5 MG tablet    Sig: TAKE 1 TABLET BY MOUTH EVERY DAY FOR BLOOD PRESSURE    Dispense:  90 tablet    Refill:  1    Order Specific Question:   Supervising Provider    Answer:   Mikey Kirschner [[id number]]   Continue current medications as directed.  Encouraged activity as tolerated.  Continue follow-up with her MS specialist.  Given prescription for repeat labs to be done through work including potassium lipid and liver profiles. Return in about 6 months (around 09/08/2017) for physical.

## 2017-03-10 NOTE — Telephone Encounter (Signed)
Krystal Silva just saw today, plz ask her

## 2017-03-14 ENCOUNTER — Other Ambulatory Visit: Payer: Self-pay | Admitting: Family Medicine

## 2017-03-15 NOTE — Telephone Encounter (Signed)
Last seen 10.19.18

## 2017-03-29 ENCOUNTER — Other Ambulatory Visit: Payer: Self-pay | Admitting: Family Medicine

## 2017-03-31 ENCOUNTER — Telehealth: Payer: Self-pay | Admitting: Family Medicine

## 2017-03-31 NOTE — Telephone Encounter (Signed)
Review blood work results in [facility].

## 2017-04-14 ENCOUNTER — Other Ambulatory Visit: Payer: Self-pay | Admitting: Neurology

## 2017-04-17 NOTE — Telephone Encounter (Signed)
Reviewed and sent for scanning.

## 2017-04-21 ENCOUNTER — Other Ambulatory Visit: Payer: Self-pay | Admitting: Family Medicine

## 2017-06-02 ENCOUNTER — Other Ambulatory Visit: Payer: Self-pay | Admitting: Family Medicine

## 2017-06-21 ENCOUNTER — Telehealth: Payer: Self-pay | Admitting: Family Medicine

## 2017-06-21 NOTE — Telephone Encounter (Signed)
Pt called requesting an appt for flu like symptoms. Pt was told to go to urgent care. Pt verbalized understanding.

## 2017-06-22 ENCOUNTER — Telehealth: Payer: Self-pay | Admitting: Nurse Practitioner

## 2017-06-22 ENCOUNTER — Encounter: Payer: Self-pay | Admitting: Nurse Practitioner

## 2017-06-22 ENCOUNTER — Ambulatory Visit (HOSPITAL_COMMUNITY)
Admission: RE | Admit: 2017-06-22 | Discharge: 2017-06-22 | Disposition: A | Discharge status: Home / Self Care | Payer: Commercial Managed Care - PPO | Source: Ambulatory Visit | Attending: Nurse Practitioner | Admitting: Nurse Practitioner

## 2017-06-22 ENCOUNTER — Encounter: Payer: Self-pay | Admitting: Family Medicine

## 2017-06-22 ENCOUNTER — Ambulatory Visit (INDEPENDENT_AMBULATORY_CARE_PROVIDER_SITE_OTHER): Payer: Commercial Managed Care - PPO | Admitting: Nurse Practitioner

## 2017-06-22 ENCOUNTER — Other Ambulatory Visit (HOSPITAL_COMMUNITY)
Admission: RE | Admit: 2017-06-22 | Discharge: 2017-06-22 | Disposition: A | Discharge status: Home / Self Care | Payer: Commercial Managed Care - PPO | Source: Ambulatory Visit | Attending: Nurse Practitioner | Admitting: Nurse Practitioner

## 2017-06-22 VITALS — BP 128/78 | HR 80 | Temp 98.2°F | Ht 63.5 in | Wt 182.8 lb

## 2017-06-22 DIAGNOSIS — R0789 Other chest pain: Secondary | ICD-10-CM | POA: Insufficient documentation

## 2017-06-22 DIAGNOSIS — E876 Hypokalemia: Secondary | ICD-10-CM | POA: Diagnosis not present

## 2017-06-22 DIAGNOSIS — R0602 Shortness of breath: Secondary | ICD-10-CM | POA: Diagnosis not present

## 2017-06-22 DIAGNOSIS — J069 Acute upper respiratory infection, unspecified: Secondary | ICD-10-CM

## 2017-06-22 DIAGNOSIS — Z79899 Other long term (current) drug therapy: Secondary | ICD-10-CM

## 2017-06-22 DIAGNOSIS — I10 Essential (primary) hypertension: Secondary | ICD-10-CM

## 2017-06-22 DIAGNOSIS — Z13220 Encounter for screening for lipoid disorders: Secondary | ICD-10-CM

## 2017-06-22 LAB — BASIC METABOLIC PANEL
ANION GAP: 11 (ref 5–15)
BUN: 10 mg/dL (ref 6–20)
CALCIUM: 9.7 mg/dL (ref 8.9–10.3)
CO2: 27 mmol/L (ref 22–32)
Chloride: 103 mmol/L (ref 101–111)
Creatinine, Ser: 0.94 mg/dL (ref 0.44–1.00)
Glucose, Bld: 127 mg/dL — ABNORMAL HIGH (ref 65–99)
Potassium: 3.3 mmol/L — ABNORMAL LOW (ref 3.5–5.1)
Sodium: 141 mmol/L (ref 135–145)

## 2017-06-22 LAB — CBC WITH DIFFERENTIAL/PLATELET
BASOS ABS: 0.1 10*3/uL (ref 0.0–0.1)
BASOS PCT: 1 %
Eosinophils Absolute: 0.1 10*3/uL (ref 0.0–0.7)
Eosinophils Relative: 1 %
HEMATOCRIT: 46.9 % — AB (ref 36.0–46.0)
HEMOGLOBIN: 16 g/dL — AB (ref 12.0–15.0)
Lymphocytes Relative: 23 %
Lymphs Abs: 2.3 10*3/uL (ref 0.7–4.0)
MCH: 29.9 pg (ref 26.0–34.0)
MCHC: 34.1 g/dL (ref 30.0–36.0)
MCV: 87.5 fL (ref 78.0–100.0)
Monocytes Absolute: 1 10*3/uL (ref 0.1–1.0)
Monocytes Relative: 10 %
NEUTROS ABS: 6.5 10*3/uL (ref 1.7–7.7)
NEUTROS PCT: 65 %
Platelets: 262 10*3/uL (ref 150–400)
RBC: 5.36 MIL/uL — AB (ref 3.87–5.11)
RDW: 12.7 % (ref 11.5–15.5)
WBC: 9.9 10*3/uL (ref 4.0–10.5)

## 2017-06-22 NOTE — Telephone Encounter (Signed)
Patient is aware. I have mailed the order to her home.

## 2017-06-22 NOTE — Telephone Encounter (Signed)
Patient is requesting orders for labs.  She is seeing Hoyle Sauer on 09/13/17.  She would like the orders printed out so she can take to work and have this done.

## 2017-06-22 NOTE — Telephone Encounter (Signed)
Met 7, lipid, liver; please write out order and send to patient; she will do these through work before next visit

## 2017-06-22 NOTE — Addendum Note (Signed)
Addended by: Karle Barr on: 06/22/2017 01:54 PM   Modules accepted: Orders

## 2017-06-23 ENCOUNTER — Telehealth: Payer: Self-pay | Admitting: Nurse Practitioner

## 2017-06-23 MED ORDER — DOXYCYCLINE HYCLATE 100 MG PO TABS: 100.0000 mg | ORAL_TABLET | Freq: Two times a day (BID) | ORAL | 0 refills | Status: DC

## 2017-06-23 NOTE — Telephone Encounter (Signed)
I do not see anything about a antibx.Please advise.

## 2017-06-23 NOTE — Telephone Encounter (Signed)
Just sent in

## 2017-06-23 NOTE — Telephone Encounter (Signed)
Pt called to check on this I informed her that it was called in. Pt verbalized understanding.

## 2017-06-23 NOTE — Telephone Encounter (Signed)
Patient seen [facility] yesterday.  She said she was supposed to have an antibiotic called in to [facility].  They don't have anything.  Please advise.

## 2017-06-24 ENCOUNTER — Encounter: Payer: Self-pay | Admitting: Nurse Practitioner

## 2017-06-24 NOTE — Progress Notes (Signed)
Subjective: Presents for complaints of sore throat congestion and fatigue that began 5 days ago.  Used tele-doc through her insurance over the weekend and was prescribed a Z-Pak.  Has also been using Mucinex.  Sore throat has improved.  Fever improved.  Worsening frontal area headache.  Runny nose.  Frequent cough producing green mucus.  Tightness in her chest with slight shortness of breath at times especially with frequent cough.  No wheezing.  Bilateral ear pain.  No vomiting diarrhea or abdominal pain.  Taking fluids well.  Voiding normal limit.  Objective:   BP 128/78   Pulse 80   Temp 98.2 F (36.8 C) (Oral)   Ht 5' 3.5" (1.613 m)   Wt 182 lb 12.8 oz (82.9 kg)   BMI 31.87 kg/m  NAD.  Alert, oriented.  TMs significant clear effusion, no erythema.  Pharynx minimally injected with PND noted.  Neck supple with mild soft anterior adenopathy.  Lungs clear.  Mild shortness of breath noted with talking.  Normal color.  No tachypnea.  Heart regular rate and rhythm. CBC with differential WBC count normal at 9.9.  Potassium low at 3.3.   Assessment:   Problem List Items Addressed This Visit      Other   Hypokalemia    Other Visit Diagnoses    Shortness of breath    -  Primary   Acute upper respiratory infection       Chest tightness or pressure       Relevant Orders   CBC with Differential   Basic Metabolic Panel (BMET)   DG Chest 2 View (Completed)        Plan:   Meds ordered this encounter  Medications  . doxycycline (VIBRA-TABS) 100 MG tablet    Sig: Take 1 tablet (100 mg total) by mouth 2 (two) times daily.    Dispense:  20 tablet    Refill:  0    Order Specific Question:   Supervising Provider    Answer:   Mikey Kirschner [[id number]]   OTC meds as directed for congestion and cough.  Warning signs reviewed.  Call back in 5-7 days if no improvement, sooner if worse.  Increase K Dur to 2 tabs per day or 20 mg total. 25 minutes was spent with the patient.  This statement  verifies that 25 minutes was indeed spent with the patient. Greater than half the time was spent in discussion, counseling and answering questions  regarding the issues that the patient came in for today as reflected in the diagnosis (s) please refer to documentation for further details.

## 2017-07-10 ENCOUNTER — Telehealth: Payer: Self-pay | Admitting: Family Medicine

## 2017-07-10 NOTE — Telephone Encounter (Signed)
Left msg to return call regarding Losartan recall; pt is to call pharmacy to check

## 2017-07-12 NOTE — Telephone Encounter (Signed)
Left message to return call 

## 2017-07-13 NOTE — Telephone Encounter (Signed)
Pt states she called [facility] and checked with them and they said her losartan/hct was not part of the recall. It was a different lot number.

## 2017-08-03 ENCOUNTER — Other Ambulatory Visit: Payer: Self-pay | Admitting: Family Medicine

## 2017-08-03 ENCOUNTER — Telehealth: Payer: Self-pay | Admitting: Neurology

## 2017-08-03 ENCOUNTER — Other Ambulatory Visit: Payer: Self-pay | Admitting: Nurse Practitioner

## 2017-08-03 DIAGNOSIS — G35 Multiple sclerosis: Secondary | ICD-10-CM

## 2017-08-03 DIAGNOSIS — Z1231 Encounter for screening mammogram for malignant neoplasm of breast: Secondary | ICD-10-CM

## 2017-08-03 NOTE — Telephone Encounter (Signed)
MRI will have to be reordered, new PA obtained.  New order in Epic/fim

## 2017-08-03 NOTE — Addendum Note (Signed)
Addended by: France Ravens I on: 08/03/2017 04:05 PM   Modules accepted: Orders

## 2017-08-03 NOTE — Telephone Encounter (Signed)
Pt states Dr Felecia Shelling had her insurance contacted for approval of a MRI, it was approved from 10-03 to 03-23-2017 pt states she missed that time frame.  She would like Dr Felecia Shelling to submit the request again.  Please call

## 2017-08-04 NOTE — Telephone Encounter (Signed)
I left a voicemail for patient to call me back about scheduling mri.

## 2017-08-04 NOTE — Telephone Encounter (Signed)
The patient called me back and I informed her since her deductible hasn't been met the cost of the exam would be about $827.91 I did offer her the payment plan but she stated that she could not do it with the payment plan right now and would have to hold off on having the exam.  The remaining deductible is $543.75 + her 20% coins is $284.16 that is where I get the $827.91.

## 2017-08-04 NOTE — Telephone Encounter (Signed)
Noted, I got the authorization for [facility]. Auth: [id number] (exp. 08/04/17 to 09/02/17)

## 2017-08-04 NOTE — Telephone Encounter (Signed)
Informed Dr. Felecia Shelling of the information below.

## 2017-08-30 ENCOUNTER — Encounter: Payer: Self-pay | Admitting: Neurology

## 2017-08-30 ENCOUNTER — Ambulatory Visit (INDEPENDENT_AMBULATORY_CARE_PROVIDER_SITE_OTHER): Payer: Commercial Managed Care - PPO | Admitting: Neurology

## 2017-08-30 ENCOUNTER — Other Ambulatory Visit: Payer: Self-pay

## 2017-08-30 VITALS — BP 108/76 | HR 72 | Resp 16 | Ht 63.5 in | Wt 199.5 lb

## 2017-08-30 DIAGNOSIS — G35 Multiple sclerosis: Secondary | ICD-10-CM | POA: Diagnosis not present

## 2017-08-30 DIAGNOSIS — M1711 Unilateral primary osteoarthritis, right knee: Secondary | ICD-10-CM

## 2017-08-30 DIAGNOSIS — F418 Other specified anxiety disorders: Secondary | ICD-10-CM

## 2017-08-30 DIAGNOSIS — G2581 Restless legs syndrome: Secondary | ICD-10-CM

## 2017-08-30 DIAGNOSIS — R269 Unspecified abnormalities of gait and mobility: Secondary | ICD-10-CM | POA: Diagnosis not present

## 2017-08-30 DIAGNOSIS — R208 Other disturbances of skin sensation: Secondary | ICD-10-CM

## 2017-08-30 MED ORDER — ROPINIROLE HCL 0.5 MG PO TABS: ORAL_TABLET | ORAL | 5 refills | Status: DC

## 2017-08-30 NOTE — Progress Notes (Signed)
[facility]  PATIENT: Krystal Silva Krystal Silva DOB: [birth year]  REFERRING DOCTOR OR PCP:   Krystal Silva SOURCE: patient, records from initial Neurology group and MRI images on PACS/CD  _________________________________   HISTORICAL  CHIEF COMPLAINT:  Chief Complaint  Patient presents with  . Multiple Sclerosis    Sts. she continues to tolerate Avonex well. Still has some difficulty with finding inj. sites/fim    HISTORY OF PRESENT ILLNESS:  Krystal Silva is a 62 year old woman with MS.     Update 08/30/2017: She feels her MS has been stable.  She is on Avonex and generally tolerates it well though she will sometimes get injection site pain and is having more difficulty finding good injection sites.  In the past, we had discussed oral agents she has been reluctant to make a switch.  She notes mild gait disturbance. .  Knee issues also affect her gait but knee pain is better with acupuncture.   She also has leg dysesthetic pain.  This pain has been improved by imipramine but she still wakes up with pain there.   She finds she needs to move around some nights to get comfortable.  .  She denies any major problems with her bladder function.  Vision is good.    She continues to experience a lot of physical fatigue that is worse with heat.  She sleeps well most nights and her sleep maintenance insomnia was improved with imipramine but she still has RLS.   She has never been on gabapentin..  She has had some depression but feels mood is generally good with the sertraline.  She notes no major problems with cognition.     I reviewed recent labs:   Normal LFT and WBC.   Slightly increased HgBA1c=5.7   Update 02/22/2017:    She is on Avonex and denies any exacerbation.    She finds it hard to find less painful injection sites and is starting to get tired of giving herself shots.   However, because her MS is stable, she is  reluctant to change.    Her gait is off, possibly due to right knee arthritis.   She has some leg dysesthestic pain, better with imipramine.    Bladder function is well. Vision is doing well.  She has a lot of fatigue and this is worse with heat.     She sleeps better with imipramine.   Mood is better with sertraline and imipramine.   Cognition is doing well.     She gets a lot of allergies in the fall and that makes her sleepier.   She takes Benadryl at bedtime.  Sometimes she takes a daytime Zyrtec.[name]   Her right knee is still bothering her. She thinks she will need to have a total knee replacement soon.  She takes vit D daily.   The last level (08/2016) was 43.7.     __________________________ From 08/18/2016  MS:   She is on Avonex with some tolerability issues with mild flulike reactions at 1 day after he each injection.   She also has noted some lumps under her skin from the injections, especially in the thighs.. She denies any recent exacerbation.    Gait/strength/sensation: She notes some difficulty with her gait but this is mostly due to her right knee. However, she does note that her balance is a little bit off at times.   She stumbles but no falls.   She denies any significant weakness  in the arms or legs. She has dysesthetic pain in both lower legs.     We tried imipramine and gabapentin but they were poorly tolerated.      She never filled the lamotrigine as she was worried about side effects.  Bladder: She denies any significant problem with urinary function. Specifically, she does not have nocturia, frequency or hesitancy.  Vision: She denies any current MS related vision problem.  The diplopia she had in 2003 resolved.  She denies diplopia when tired or hot.  She has never had optic neuritis.  Fatigue/sleep: She feels more tired the past year than usual.   Fatigue is both cognitive and physical.  She feels she sleeps well at night.    She does not snore.     Mood/cognition:  She  feels mood is a little worse and she is sometimes mildly depressed.     She is on sertraline and tolerates it well.      She denies any significant difficulty with cognition. She has not noted problems with her memory.   She works full time.   Hearing: She wears hearing aids bilaterally.  She has a little bit of tinnitus   She has a family history of deafness.  Knee:   She had recent surgery for knee pain and instability and does not think the surgery helped so a TKR is being considered.     MS History:  In 2003, she had an episode of diplopia. An MRI was performed but she was not diagnosed with MS at that time. In 2006, in [place],  she presented with dysesthetic sensations in the legs, mostly around the shin. She had MRI's performed that were more consistent with MS. She also had a lumbar puncture and the CSF was consistent with MS. The CSF showed oligoclonal bands and IgG index of 1.37 (elevated). She started to see Dr. George Silva and then Dr. Shelia Silva in 2010/2011 after moving to the area.    An MRI performed in 2011 was reportedly very similar to the one from 2006. She has been on Avonex therapy since diagnosis.[name]  REVIEW OF SYSTEMS: Constitutional: No fevers, chills, sweats, or change in appetite Eyes: No visual changes, double vision, eye pain Ear, nose and throat: No hearing loss, ear pain, nasal congestion, sore throat Cardiovascular: No chest pain, palpitations Respiratory: No shortness of breath at rest or with exertion.   No wheezes GastrointestinaI: No nausea, vomiting, diarrhea, abdominal pain, fecal incontinence Genitourinary: No dysuria, urinary retention or frequency.  No nocturia. Musculoskeletal: No neck pain, back pain Integumentary: No rash, pruritus, skin lesions Neurological: as above Psychiatric: No depression at this time.  No anxiety Endocrine: No palpitations, diaphoresis, change in appetite, change in weigh or increased thirst Hematologic/Lymphatic: No anemia,  purpura, petechiae. Allergic/Immunologic: No itchy/runny eyes, nasal congestion, recent allergic reactions, rashes  ALLERGIES: Allergies  Allergen Reactions  . Codeine   . Penicillins     REACTION: rash  . Sulfonamide Derivatives   . Sulfamethoxazole Rash    HOME MEDICATIONS:  Current Outpatient Medications:  .  AVONEX PEN 30 MCG/0.5ML AJKT, INJECT 1 PREFILLED PEN (30MCG) INTRAMUSCULARLY ONCE A WEEK, Disp: 12 each, Rfl: 3 .  baclofen (LIORESAL) 10 MG tablet, Take one tablet 3 times daily as needed., Disp: 270 tablet, Rfl: 3 .  Cholecalciferol (VITAMIN D-3) 5000 UNITS TABS, Take by mouth daily., Disp: , Rfl:  .  doxycycline (VIBRA-TABS) 100 MG tablet, Take 1 tablet (100 mg total) by mouth 2 (two) times daily.,  Disp: 20 tablet, Rfl: 0 .  ibuprofen (ADVIL,MOTRIN) 800 MG tablet, TAKE 1 TABLET BY MOUTH 3 TIMES A DAY AS NEEDED FOR PAIN, Disp: 90 tablet, Rfl: 1 .  imipramine (TOFRANIL) 25 MG tablet, One or two at bedtime, Disp: 60 tablet, Rfl: 11 .  KLOR-CON M10 10 MEQ tablet, TAKE 1 TABLET BY MOUTH EVERY DAY, Disp: 30 tablet, Rfl: 5 .  LACTOBACILLUS RHAMNOSUS, GG, PO, Take 4 capsules by mouth 4 (four) times daily as needed., Disp: , Rfl:  .  losartan-hydrochlorothiazide (HYZAAR) 100-12.5 MG tablet, TAKE 1 TABLET BY MOUTH EVERY DAY FOR BLOOD PRESSURE, Disp: 90 tablet, Rfl: 1 .  Multiple Vitamin (MULTIVITAMIN) tablet, Take 1 tablet by mouth daily., Disp: , Rfl:  .  NAPROXEN PO, Take by mouth., Disp: , Rfl:  .  Probiotic Product (PROBIOTIC DAILY PO), Take by mouth., Disp: , Rfl:  .  sertraline (ZOLOFT) 100 MG tablet, TAKE 1 TABLET (100 MG TOTAL) BY MOUTH DAILY., Disp: 90 tablet, Rfl: 1 .  simvastatin (ZOCOR) 40 MG tablet, TAKE 1 TABLET (40 MG TOTAL) BY MOUTH AT BEDTIME., Disp: 90 tablet, Rfl: 1 .  vitamin E 400 UNIT capsule, Take 800 Units by mouth daily., Disp: , Rfl:  .  rOPINIRole (REQUIP) 0.5 MG tablet, One or two at bedtime, Disp: 60 tablet, Rfl: 5  PAST MEDICAL HISTORY: Past Medical  History:  Diagnosis Date  . Allergy   . Diverticulitis   . Hypercholesteremia   . Hypertension   . Insomnia   . MS (multiple sclerosis) ([place])   . Vitamin D deficiency     PAST SURGICAL HISTORY: Past Surgical History:  Procedure Laterality Date  . COLONOSCOPY  08/21/2009  . KNEE ARTHROSCOPY WITH MEDIAL MENISECTOMY Right 03/27/2015   Procedure: KNEE ARTHROSCOPY WITH PARTIAL MEDIAL MENISECTOMY AND MICROFRACTURE;  Surgeon: Carole Civil, MD;  Location: [facility];  Service: Orthopedics;  Laterality: Right;  . TONSILLECTOMY    . WISDOM TOOTH EXTRACTION  1980's    FAMILY HISTORY: Family History  Problem Relation Age of Onset  . Hypertension Mother   . Hyperlipidemia Father   . Hearing loss Father   . Hyperlipidemia Brother   . Diabetes Brother   . Vision loss Maternal Grandmother   . Vision loss Paternal Grandmother   . Cancer Other        breast    SOCIAL HISTORY:  Social History   Socioeconomic History  . Marital status: Divorced    Spouse name: Not on file  . Number of children: Not on file  . Years of education: 10  . Highest education level: Not on file  Occupational History  . Occupation: work   Scientific laboratory technician  . Financial resource strain: Not on file  . Food insecurity:    Worry: Not on file    Inability: Not on file  . Transportation needs:    Medical: Not on file    Non-medical: Not on file  Tobacco Use  . Smoking status: Former Smoker    Packs/day: 1.50    Years: 30.00    Pack years: 45.00    Types: Cigarettes    Start date: 12/26/1974    Last attempt to quit: 06/22/2004    Years since quitting: 13.1  . Smokeless tobacco: Never Used  Substance and Sexual Activity  . Alcohol use: Yes    Alcohol/week: 0.6 oz    Types: 1 Cans of beer per week    Comment: rare social  . Drug use: No  .  Sexual activity: Not Currently  Lifestyle  . Physical activity:    Days per week: Not on file    Minutes per session: Not on file  . Stress: Not on file    Relationships  . Social connections:    Talks on phone: Not on file    Gets together: Not on file    Attends religious service: Not on file    Active member of club or organization: Not on file    Attends meetings of clubs or organizations: Not on file    Relationship status: Not on file  . Intimate partner violence:    Fear of current or ex partner: Not on file    Emotionally abused: Not on file    Physically abused: Not on file    Forced sexual activity: Not on file  Other Topics Concern  . Not on file  Social History Narrative   Coffee in the morning couple of cups    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[employer]     PHYSICAL EXAM  Vitals:   08/30/17 1000  BP: 108/76  Pulse: 72  Resp: 16  Weight: 199 lb 8 oz (90.5 kg)  Height: 5' 3.5" (1.613 m)    Body mass index is 34.79 kg/m.   General: The patient is well-developed and well-nourished and in no acute distress  Musculoskeletal:  Back is nontender  Neurologic Exam  Mental status: The patient is alert and oriented x 3 at the time of the examination. The patient has apparent normal recent and remote memory, with an apparently normal attention span and concentration ability.   Speech is normal.  Cranial nerves: Extraocular movements are full.  Her facial strength and sensation is normal.  The trapezius strength is normal.  Hearing is normal and symmetric.  Motor:  Muscle bulk is normal.   Tone is mildly increased in legs. Strength is  5 / 5 in all 4 extremities except 4+/5 in right EHL.   Sensory: Sensory testing intact to touch and vibration in the arms and legs.  Coordination: She has good finger-nose-finger and heel-to-shin bilaterally.  Gait and station: Station is normal.   Gait shows a mild limp due to right knee pain. Her tandem gait is mildly wide.. Romberg is negative.   Reflexes: Deep tendon reflexes are symmetric and increased bilaterally with  2 beats non-sustained  clonus at ankles.        DIAGNOSTIC DATA (LABS, IMAGING, TESTING) - I reviewed patient records, labs, notes, testing and imaging myself where available.    ASSESSMENT AND PLAN  Multiple sclerosis ([place])  Depression with anxiety  Dysesthesia  Gait disturbance  Primary osteoarthritis of right knee  Restless leg syndrome   1.    She will continue Avonex.  We briefly discussed some of the oral agents but she is reluctant to make any switch.  She would consider making a switch, however, if she has a relapse or more MRI activity.  She would like to hold off on an MRI until later in the year.   2.   Imipramine will be continued but in case it is worsening restless leg syndrome, I am not can increase it.  I will have her try ropinirole to see if that helps her leg symptoms. 3.   Return to see me in 6 months or sooner if there are new or worsening neurologic symptoms.  Richard A. Felecia Shelling, MD, PhD 9/76/7341, 9:37 PM Certified  in Neurology, Clinical Neurophysiology, Sleep Medicine, Pain Medicine and Neuroimaging  [facility] [address], [place], [place] [address] [phone]

## 2017-09-04 ENCOUNTER — Ambulatory Visit (HOSPITAL_COMMUNITY)
Admission: RE | Admit: 2017-09-04 | Discharge: 2017-09-04 | Disposition: A | Discharge status: Home / Self Care | Payer: Commercial Managed Care - PPO | Source: Ambulatory Visit | Attending: Nurse Practitioner | Admitting: Nurse Practitioner

## 2017-09-04 ENCOUNTER — Encounter (HOSPITAL_COMMUNITY): Payer: Self-pay

## 2017-09-04 DIAGNOSIS — Z1231 Encounter for screening mammogram for malignant neoplasm of breast: Secondary | ICD-10-CM | POA: Diagnosis not present

## 2017-09-13 ENCOUNTER — Ambulatory Visit (INDEPENDENT_AMBULATORY_CARE_PROVIDER_SITE_OTHER): Payer: Commercial Managed Care - PPO | Admitting: Nurse Practitioner

## 2017-09-13 ENCOUNTER — Encounter: Payer: Self-pay | Admitting: Nurse Practitioner

## 2017-09-13 VITALS — BP 130/84 | Ht 63.5 in | Wt 179.8 lb

## 2017-09-13 DIAGNOSIS — Z0001 Encounter for general adult medical examination with abnormal findings: Secondary | ICD-10-CM

## 2017-09-13 DIAGNOSIS — N6320 Unspecified lump in the left breast, unspecified quadrant: Secondary | ICD-10-CM

## 2017-09-13 DIAGNOSIS — Z1151 Encounter for screening for human papillomavirus (HPV): Secondary | ICD-10-CM | POA: Diagnosis not present

## 2017-09-13 DIAGNOSIS — Z01419 Encounter for gynecological examination (general) (routine) without abnormal findings: Secondary | ICD-10-CM

## 2017-09-13 DIAGNOSIS — Z124 Encounter for screening for malignant neoplasm of cervix: Secondary | ICD-10-CM | POA: Diagnosis not present

## 2017-09-14 ENCOUNTER — Other Ambulatory Visit: Payer: Self-pay | Admitting: Neurology

## 2017-09-14 ENCOUNTER — Encounter: Payer: Self-pay | Admitting: Nurse Practitioner

## 2017-09-14 NOTE — Progress Notes (Signed)
Subjective:    Patient ID: Krystal Silva, female    DOB: 06-27-1955, 62 y.o.   MRN: [id number]  HPI presents for her wellness exam. No vaginal bleeding or pelvic pain. No current or new sexual partners. Regular vision and dental exams. Has an active job but activity limited due to right knee pain. Has seen orthopedic specialist for this. Is due for skin cancer screening; plans to schedule her own appointment with dermatologist.     Review of Systems  Constitutional: Negative for activity change, appetite change and fatigue.  HENT: Positive for hearing loss. Negative for dental problem, ear pain, sinus pressure and sore throat.        Wearing hearing aids bilaterally.  Respiratory: Negative for cough, chest tightness, shortness of breath and wheezing.   Cardiovascular: Negative for chest pain.  Gastrointestinal: Negative for abdominal distention, abdominal pain, blood in stool, constipation, diarrhea, nausea and vomiting.  Genitourinary: Negative for difficulty urinating, dysuria, enuresis, frequency, genital sores, pelvic pain, urgency and vaginal discharge.   Depression screen [facility] 2/9 09/13/2017 09/09/2016  Decreased Interest 0 0  Down, Depressed, Hopeless 0 0  PHQ - 2 Score 0 0        Objective:   Physical Exam  Constitutional: She is oriented to person, place, and time. She appears well-developed. No distress.  HENT:  Right Ear: External ear normal.  Left Ear: External ear normal.  Mouth/Throat: Oropharynx is clear and moist.  Neck: Normal range of motion. Neck supple. No tracheal deviation present. No thyromegaly present.  Cardiovascular: Normal rate, regular rhythm and normal heart sounds. Exam reveals no gallop.  No murmur heard. Pulmonary/Chest: Effort normal and breath sounds normal. Right breast exhibits no inverted nipple, no mass, no skin change and no tenderness. Left breast exhibits mass. Left breast exhibits no inverted nipple, no skin change and no tenderness.  Breasts are symmetrical.  Dense tissue; 2 cm mass noted at one o'clock left breast approx 1-2 cm from areola. Axillae no adenopathy. No change from previous exam. Mammogram normal.   Abdominal: Soft. She exhibits no distension. There is no tenderness.  Genitourinary: Vagina normal and uterus normal. No vaginal discharge found.  Genitourinary Comments: External GU: pale and dry; no rashes or lesions. Vagina: pale and moist. Difficulty performing speculum exam due to anxiety. No CMT. Bimanual exam limited but no obvious tenderness or masses.   Musculoskeletal: She exhibits no edema.  Lymphadenopathy:    She has no cervical adenopathy.  Neurological: She is alert and oriented to person, place, and time.  Skin: Skin is warm and dry. No rash noted.  Psychiatric: She has a normal mood and affect. Her behavior is normal.  Vitals reviewed.         Assessment & Plan:   Problem List Items Addressed This Visit      Other   Breast mass, left unchanged    Other Visit Diagnoses    Well woman exam    -  Primary   Relevant Orders   Pap IG and HPV (high risk) DNA detection   Screening for cervical cancer       Relevant Orders   Pap IG and HPV (high risk) DNA detection   Screening for HPV (human papillomavirus)       Relevant Orders   Pap IG and HPV (high risk) DNA detection     Meets criteria for low dose CT scan of the lungs but defers for now. Recommend activity as tolerated, continued weight loss efforts  and daily vitamin D and calcium.  Return in about 6 months (around 03/15/2018) for recheck.

## 2017-09-16 LAB — PAP IG AND HPV HIGH-RISK
HPV, high-risk: NEGATIVE
PAP SMEAR COMMENT: 0

## 2017-09-22 ENCOUNTER — Other Ambulatory Visit: Payer: Self-pay | Admitting: Nurse Practitioner

## 2018-01-17 ENCOUNTER — Telehealth: Payer: Self-pay | Admitting: Neurology

## 2018-01-17 DIAGNOSIS — G35 Multiple sclerosis: Secondary | ICD-10-CM

## 2018-01-17 NOTE — Telephone Encounter (Signed)
Order for MRI brain placed in Epic/fim

## 2018-01-17 NOTE — Telephone Encounter (Signed)
MR Brain w/wo contrast Dr. Arlyss Queen Auth: [id number] (exp. 01/17/18 to 02/15/18). Patient is scheduled at [facility] for 01/24/18.

## 2018-01-17 NOTE — Telephone Encounter (Signed)
Pt is aware that the order for her MRI has expired, pt states it was due to financial reasons as to why she could not have it done until now.  Pt is asking that another order be sent so that she can get her MRI done

## 2018-01-17 NOTE — Addendum Note (Signed)
Addended by: France Ravens I on: 01/17/2018 11:21 AM   Modules accepted: Orders

## 2018-01-24 ENCOUNTER — Telehealth: Payer: Self-pay | Admitting: *Deleted

## 2018-01-24 ENCOUNTER — Ambulatory Visit: Payer: Commercial Managed Care - PPO

## 2018-01-24 DIAGNOSIS — G35 Multiple sclerosis: Secondary | ICD-10-CM | POA: Diagnosis not present

## 2018-01-24 MED ORDER — GADOPENTETATE DIMEGLUMINE 469.01 MG/ML IV SOLN
15.0000 mL | Freq: Once | INTRAVENOUS | Status: AC | PRN
  Administered 2018-01-24: 15 mL via INTRAVENOUS

## 2018-01-24 NOTE — Telephone Encounter (Signed)
Spoke with Shawndra and reviewed below MRI results.  She verbalized understanding of same/fim

## 2018-01-24 NOTE — Telephone Encounter (Signed)
-----   Message from Britt Bottom, MD sent at 01/24/2018 10:25 AM EDT ----- Please let the patient know that the MRI looks good.  There are no changes compared to her 2016 MRI.

## 2018-01-29 ENCOUNTER — Telehealth: Payer: Self-pay | Admitting: Neurology

## 2018-01-29 NOTE — Telephone Encounter (Signed)
Krystal Silva with [name] calling to get a diagnosis code for AVONEX PEN 30 MCG/0.5ML AJKT. I advised Dr. Felecia Shelling is out of the office and will send to work in [employer].

## 2018-01-29 NOTE — Telephone Encounter (Signed)
Rn tried calling  [facility] twice to give ICD code for avonex pen. Rn call twice and was on hold first time 10 minutes. Rn call again an hour later and was on hold for 8 minutes. RN did not get any customer rep on line during these calls. RN had to hang up.

## 2018-02-26 ENCOUNTER — Other Ambulatory Visit: Payer: Self-pay | Admitting: *Deleted

## 2018-02-26 MED ORDER — SIMVASTATIN 40 MG PO TABS: 40.0000 mg | ORAL_TABLET | Freq: Every day | ORAL | 0 refills | Status: DC

## 2018-03-14 ENCOUNTER — Other Ambulatory Visit: Payer: Self-pay

## 2018-03-14 ENCOUNTER — Ambulatory Visit: Payer: Commercial Managed Care - PPO | Admitting: Nurse Practitioner

## 2018-03-14 MED ORDER — LOSARTAN POTASSIUM-HCTZ 100-12.5 MG PO TABS: ORAL_TABLET | ORAL | 1 refills | Status: DC

## 2018-03-20 ENCOUNTER — Ambulatory Visit: Payer: Commercial Managed Care - PPO | Admitting: Neurology

## 2018-04-02 ENCOUNTER — Ambulatory Visit (INDEPENDENT_AMBULATORY_CARE_PROVIDER_SITE_OTHER): Payer: Commercial Managed Care - PPO

## 2018-04-02 ENCOUNTER — Encounter: Payer: Self-pay | Admitting: Orthopedic Surgery

## 2018-04-02 ENCOUNTER — Ambulatory Visit (INDEPENDENT_AMBULATORY_CARE_PROVIDER_SITE_OTHER): Payer: Commercial Managed Care - PPO | Admitting: Orthopedic Surgery

## 2018-04-02 VITALS — BP 145/80 | HR 89 | Ht 63.5 in | Wt 178.0 lb

## 2018-04-02 DIAGNOSIS — M1712 Unilateral primary osteoarthritis, left knee: Secondary | ICD-10-CM

## 2018-04-02 DIAGNOSIS — G8929 Other chronic pain: Secondary | ICD-10-CM

## 2018-04-02 DIAGNOSIS — M5410 Radiculopathy, site unspecified: Secondary | ICD-10-CM

## 2018-04-02 DIAGNOSIS — M5136 Other intervertebral disc degeneration, lumbar region: Secondary | ICD-10-CM

## 2018-04-02 DIAGNOSIS — M25562 Pain in left knee: Secondary | ICD-10-CM

## 2018-04-02 DIAGNOSIS — M5442 Lumbago with sciatica, left side: Secondary | ICD-10-CM | POA: Diagnosis not present

## 2018-04-02 DIAGNOSIS — M25462 Effusion, left knee: Secondary | ICD-10-CM

## 2018-04-02 NOTE — Progress Notes (Signed)
OFFICE VISIT  Chief Complaint  Patient presents with  . Knee Pain    left      62-year-old female had arthroscopy microfracture Synvisc and cortisone injections right knee comes in complaining of left knee pain for the last 2 months.  She says she has pain in which started in the back of the right knee and then rotated around to the anteromedial aspect of the joint.  It is associated with some pain behind the thigh and pain in the shin on the medial side of the lower leg.  She says it feels much different than her prior arthritis and meniscal problems on the right knee.  She says it hurts quite a bit so we would rate that is moderate pain.  She says it is not getting any better even after trying knee brace.  She describes the medial knee pain as a dull ache in the posterior leg and medial shin pain as a deep gnawing type pain.     Review of Systems  Constitutional: Negative for fever.  Musculoskeletal:       Back pain with standing relieved by sitting  Skin: Negative.   Neurological: Negative for tingling, tremors, sensory change, focal weakness and weakness.     Past Medical History:  Diagnosis Date  . Allergy   . Diverticulitis   . Hypercholesteremia   . Hypertension   . Insomnia   . MS (multiple sclerosis) ([place])   . Vitamin D deficiency     Past Surgical History:  Procedure Laterality Date  . COLONOSCOPY  08/21/2009  . KNEE ARTHROSCOPY WITH MEDIAL MENISECTOMY Right 03/27/2015   Procedure: KNEE ARTHROSCOPY WITH PARTIAL MEDIAL MENISECTOMY AND MICROFRACTURE;  Surgeon: Carole Civil, MD;  Location: [facility];  Service: Orthopedics;  Laterality: Right;  . TONSILLECTOMY    . WISDOM TOOTH EXTRACTION  1980's    Family History  Problem Relation Age of Onset  . Hypertension Mother   . Hyperlipidemia Father   . Hearing loss Father   . Hyperlipidemia Brother   . Diabetes Brother   . Vision loss Maternal Grandmother   . Vision loss Paternal Grandmother   . Cancer Other         breast   Social History   Tobacco Use  . Smoking status: Former Smoker    Packs/day: 1.50    Years: 30.00    Pack years: 45.00    Types: Cigarettes    Start date: 12/26/1974    Last attempt to quit: 06/22/2004    Years since quitting: 13.7  . Smokeless tobacco: Never Used  Substance Use Topics  . Alcohol use: Yes    Alcohol/week: 1.0 standard drinks    Types: 1 Cans of beer per week    Comment: rare social  . Drug use: No    Allergies  Allergen Reactions  . Codeine   . Penicillins     REACTION: rash  . Sulfonamide Derivatives   . Sulfamethoxazole Rash    Current Meds  Medication Sig  . AVONEX PEN 30 MCG/0.5ML AJKT INJECT 1 PREFILLED PEN (30MCG) INTRAMUSCULARLY ONCE A WEEK  . baclofen (LIORESAL) 10 MG tablet Take one tablet 3 times daily as needed.  . Cholecalciferol (VITAMIN D-3) 5000 UNITS TABS Take by mouth daily.  [name] imipramine (TOFRANIL) 25 MG tablet TAKE ONE OR TWO BY MOUTH AT BEDTIME  . KLOR-CON M10 10 MEQ tablet TAKE 1 TABLET BY MOUTH EVERY DAY  . LACTOBACILLUS RHAMNOSUS, GG, PO Take 4 capsules by mouth  4 (four) times daily as needed.  [name] losartan-hydrochlorothiazide (HYZAAR) 100-12.5 MG tablet TAKE 1 TABLET BY MOUTH EVERY DAY FOR BLOOD PRESSURE  . Multiple Vitamin (MULTIVITAMIN) tablet Take 1 tablet by mouth daily.  [name] NAPROXEN PO Take by mouth.  . Probiotic Product (PROBIOTIC DAILY PO) Take by mouth.  [name] rOPINIRole (REQUIP) 0.5 MG tablet One or two at bedtime  . sertraline (ZOLOFT) 100 MG tablet TAKE 1 TABLET (100 MG TOTAL) BY MOUTH DAILY.  . simvastatin (ZOCOR) 40 MG tablet Take 1 tablet (40 mg total) by mouth at bedtime.  . vitamin E 400 UNIT capsule Take 800 Units by mouth daily.    BP (!) 145/80   Pulse 89   Ht 5' 3.5" (1.613 m)   Wt 178 lb (80.7 kg)   BMI 31.04 kg/m    Physical Exam Well-developed well-nourished female awake alert and oriented x3 mood and affect normal gait shows a mild limp favoring the left knee.  Ortho Exam Right knee  flexion 125 degrees no effusion no tenderness muscle tone and strength normal   Left knee shows tenderness to palpation over the posterior aspect of the knee including the popliteal fossa and medial and central hamstring area.  She also has medial joint line tenderness with large effusion.  She can flex her knee approximate 120 degrees does not come to full extension.  Stability tests were normal muscle tone and strength excellent skin warm dry and intact no rash or erythema distal pulses intact sensation normal straight leg raise positive at 45 degrees for pain running down the back of the leg  Tenderness in the lower back on the left side and midline approximately L4 and L5 with no pain on the right muscle tension normal skin normal no frank range of motion deficits.  No pathologic reflexes deep tendon reflexes equal in both lower extremities.   MEDICAL DECISION SECTION  Xrays were done at [facility] orthopedics  My independent reading of xrays:  Lumbar spine films 3 views and knee films show joint space narrowing medial compartment moderate severe patellofemoral deformity and arthritis grade 4  Lumbar spine films show multilevel degenerative disc disease abnormal coronal and sagittal plane alignment see report    Encounter Diagnoses  Name Primary?  . Chronic pain of left knee Yes  . Radicular pain of left lower extremity   . DDD (degenerative disc disease), lumbar   . Effusion of knee joint, left   . Arthritis of left knee     PLAN: (Rx., injectx, surgery, frx, mri/ct)  Recommend physical therapy for degenerative disc disease lumbar spine  Recommend aspiration injection left knee bracing is okay  She does not tolerate NSAIDs had a GI bleed recovered after the medication was stopped  Follow-up in 8 weeks No orders of the defined types were placed in this encounter.  Procedure note injection and aspiration left knee joint  Verbal consent was obtained to aspirate and inject  the left knee joint   Timeout was completed to confirm the site of aspiration and injection  An 18-gauge needle was used to aspirate the left knee joint from a suprapatellar lateral approach.  The medications used were 40 mg of Depo-Medrol and 1% lidocaine 3 cc  Anesthesia was provided by ethyl chloride and the skin was prepped with alcohol.  After cleaning the skin with alcohol an 18-gauge needle was used to aspirate the right knee joint.  We obtained 40 cc of fluid, clear fluid yellow  We followed this by injection  of 40 mg of Depo-Medrol and 3 cc 1% lidocaine.  There were no complications. A sterile bandage was applied.  Arther Abbott, MD  04/02/2018 5:23 PM

## 2018-04-27 ENCOUNTER — Ambulatory Visit (HOSPITAL_COMMUNITY): Payer: Commercial Managed Care - PPO | Attending: Orthopedic Surgery

## 2018-04-27 ENCOUNTER — Encounter (HOSPITAL_COMMUNITY): Payer: Self-pay

## 2018-04-27 ENCOUNTER — Other Ambulatory Visit: Payer: Self-pay

## 2018-04-27 DIAGNOSIS — R29898 Other symptoms and signs involving the musculoskeletal system: Secondary | ICD-10-CM

## 2018-04-27 DIAGNOSIS — M25562 Pain in left knee: Secondary | ICD-10-CM | POA: Diagnosis present

## 2018-04-27 DIAGNOSIS — G8929 Other chronic pain: Secondary | ICD-10-CM

## 2018-04-27 DIAGNOSIS — M545 Low back pain, unspecified: Secondary | ICD-10-CM

## 2018-04-27 DIAGNOSIS — R262 Difficulty in walking, not elsewhere classified: Secondary | ICD-10-CM

## 2018-04-27 NOTE — Patient Instructions (Signed)
Access Code: [email or web address]  URL: [email or web address]  Date: 04/27/2018  Prepared by: Geraldine Solar   Exercises Supine Hamstring Stretch - 10 reps - 5-10sec hold - 1x daily - 7x weekly Supine Piriformis Stretch - 5-10 reps - 30-60seconds hold - 1x daily - 7x weekly Supine Figure 4 Piriformis Stretch - 5-10 reps - 30-60seconds hold - 1x daily - 7x weekly

## 2018-04-27 NOTE — Therapy (Signed)
[place], [place], [address] Phone: [phone]   Fax:  [phone]  Physical Therapy Evaluation  Patient Details  Name: Krystal Silva MRN: [id number] Date of Birth: 1955-10-17 Referring Provider (PT): Arther Abbott, MD   Encounter Date: 04/27/2018  PT End of Session - 04/27/18 0930    Visit Number  1    Number of Visits  9    Date for PT Re-Evaluation  05/25/18    Authorization Type  [employer] PPO    Authorization Time Period  04/27/18 to 05/25/17    Authorization - Visit Number  1    Authorization - Number of Visits  30   11/20/17 to 11/20/18 benefit plan year   PT Start Time  0815    PT Stop Time  0905    PT Time Calculation (min)  50 min    Activity Tolerance  Patient tolerated treatment well    Behavior During Therapy  [facility] for tasks assessed/performed       Past Medical History:  Diagnosis Date  . Allergy   . Diverticulitis   . Hypercholesteremia   . Hypertension   . Insomnia   . MS (multiple sclerosis) ([place])   . Vitamin D deficiency     Past Surgical History:  Procedure Laterality Date  . COLONOSCOPY  08/21/2009  . KNEE ARTHROSCOPY WITH MEDIAL MENISECTOMY Right 03/27/2015   Procedure: KNEE ARTHROSCOPY WITH PARTIAL MEDIAL MENISECTOMY AND MICROFRACTURE;  Surgeon: Carole Civil, MD;  Location: [facility];  Service: Orthopedics;  Laterality: Right;  . TONSILLECTOMY    . WISDOM TOOTH EXTRACTION  1980's    There were no vitals filed for this visit.   Subjective Assessment - 04/27/18 0818    Subjective  Pt reports that she is having bil knee pain. She had meniscus surgery on her R one which didn't get better and now her L knee is bothering her for the last few months. Her R knee surgery was 3YA. Dr. Aline Brochure told her that her L knee pain is coming from the curvature in her spine. She does have some LBP if she stands for a long time but she doesn't think her L knee hurts after that happens. They can  occur at the same time but both tend to occur separate of the other. She has the most difficulty with walking, stairs, and being on her feet a lot. 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 She denies any radicular symptoms from her lower back. She states that her L knee pain just started all of a sudden, no trauma or injury.     Limitations  Walking;Standing    How long can you sit comfortably?  no issues (this is LBP)    How long can you stand comfortably?  1 hour    How long can you walk comfortably?  limps when it's hurting; stays on her feet even on bad days    Patient Stated Goals  continue to walk and not have tons of pain    Currently in Pain?  No/denies         [facility] PT Assessment - 04/27/18 0001      Assessment   Medical Diagnosis  DDD, lumbar    Referring Provider (PT)  Arther Abbott, MD    Onset Date/Surgical Date  --   couple months ago   Next MD Visit  05/14/18    Prior  Therapy  yes after R knee menisectomy      Balance Screen   Has the patient fallen in the past 6 months  No    Has the patient had a decrease in activity level because of a fear of falling?   No    Is the patient reluctant to leave their home because of a fear of falling?   No      Prior Function   Level of Independence  Independent    Vocation  Full time employment    Vocation Requirements  Up and down on her feet a lot as Air cabin crew at [employer]  used to hike and kayak, craft fairs, walk, can't do these anymore since her R knee surgery in 2016      Observation/Other Assessments   Focus on Therapeutic Outcomes (FOTO)   to be completed next visit      ROM / Strength   AROM / PROM / Strength  AROM;Strength      AROM   AROM Assessment Site  Lumbar    Right/Left Knee  Left    Lumbar Flexion  in lordosis but touches toes; RFIS x10 reps: stretch in HS, no LBP or L knee pain    Lumbar Extension  WFL, in lordosis; REIS x10reps: no pain  or increase/decrease in symptoms     Lumbar - Right Side Bend  knee joint, no pain    Lumbar - Left Side Bend  knee joint, no pain    Lumbar - Right Rotation  <25% limited    Lumbar - Left Rotation  <25% limited      Strength   Strength Assessment Site  Hip;Knee;Ankle    Right Hip Flexion  4+/5    Right Hip Extension  4-/5    Right Hip ABduction  4/5    Left Hip Flexion  4/5    Left Hip Extension  4-/5    Left Hip ABduction  4/5    Right Knee Flexion  4+/5    Right Knee Extension  4+/5    Left Knee Flexion  4+/5    Left Knee Extension  5/5    Right Ankle Dorsiflexion  4+/5    Left Ankle Dorsiflexion  4+/5      Palpation   Patella mobility  WNL     Spinal mobility  hypomobile lumbar and thoracic spine    Palpation comment  increased restricions in lumbar paraspinals, lats, QL, glutes, HS bilaterally; painful to palpation and recreated LBP when palpating back mm but nothing recreated her L knee pain      Special Tests    Special Tests  Lumbar    Lumbar Tests  Straight Leg Raise      Straight Leg Raise   Findings  Negative    Comment  BLE, ~75deg hip flexion and pt only felt stretch in her HS      Ambulation/Gait   Ambulation Distance (Feet)  678 Feet   3MWT   Assistive device  None    Gait Pattern  Step-through pattern;Trendelenburg;Antalgic;Lateral trunk lean to left    Gait Comments  reported stretch in L HS during swing thru phase of gait      Balance   Balance Assessed  Yes      Static Standing Balance   Static Standing - Balance Support  No upper extremity supported    Static Standing Balance -  Activities   Single Leg Stance - Right Leg;Single  Leg Stance - Left Leg    Static Standing - Comment/# of Minutes  R: 9.5 sec L: 32sec           Objective measurements completed on examination: See above findings.        PT Education - 04/27/18 0929    Education Details  exam findings, HEP, POC    Person(s) Educated  Patient    Methods   Explanation;Demonstration;Handout    Comprehension  Verbalized understanding       PT Short Term Goals - 04/27/18 0933      PT SHORT TERM GOAL #1   Title  Pt will have improved MMT by 1/2 grade throughout in order to reduce pain and maximize gait.    Time  2    Period  Weeks    Status  New    Target Date  05/11/18      PT SHORT TERM GOAL #2   Title  Pt will report being able to walk for 1 hour with 3/10 L knee pain in order to maximize her work duties.    Time  2    Period  Weeks    Status  New      PT SHORT TERM GOAL #3   Title  Pt will report being able to stand for 2 hours with 3/10 L knee pain and LBP in order to maximize her work duties and demo improved functional strength.    Time  2    Period  Weeks    Status  New      PT SHORT TERM GOAL #4   Title  Pt will be able to perform R SLS for 30 sec or > to be symmetrical with her LLE in order to maximize gait and stairs.     Time  2    Period  Weeks    Status  New        PT Long Term Goals - 04/27/18 0934      PT LONG TERM GOAL #1   Title  Pt will have improved MMT by 1 grade throughout in order to further reduce LBP and L knee pain in order to maximize stairs and return to PLOF.     Time  4    Period  Weeks    Status  New    Target Date  05/25/18      PT LONG TERM GOAL #2   Title  Pt will have 149ft improvement in 3MWT with gait WFL and no L knee pain to demo improved tolerance to ambulation.     Time  4    Period  Weeks    Status  New      PT LONG TERM GOAL #3   Title  Pt will report being able to walk for 2 hours without L knee pain in order to further demo improved strength and functional mobility and allow her to return to some of her leisure activities with greater ease.     Time  4    Period  Weeks    Status  New      PT LONG TERM GOAL #4   Title  Pt will report being able to stand for 3 hours without L knee pain or LBP in order to further demo improved functional and core strength in order to further  maximize her work duties.    Time  4    Period  Weeks    Status  New  Plan - 04/27/18 0931    Clinical Impression Statement  Pt is pleasant 62YO F who presents to OPPT with c/o L knee pain. She also reports some LBP as well but denies any radicular or b/b s/s. Pt presenting with deficits in core strength, functional strength, BLE strength, balance, gait, and functional mobility. Pt also noted to be hypomobile throughout lumbar and thoracic spine with CPAs but non-tender to palpation and did not recreate her LBP or L knee pian. She was also noted to have increased soft tissue restrictions throughout lumbar spine, glutes, and HS; all of which were painful to palpation and palpation to her lumbar spine recreated her LBP but palpation to other mm did not recreate her L knee pain. She was noted to have 0.25" LLD on the LLE as it measured 37.5" and her RLE measured 37.75"; will encourage pt to obtain shoe lift in order to address this. Will address pt's deficits noted above with strengthening, mobility work, and manual techniques in order to reduce her pain and promote return to PLOF.     Clinical Presentation  Stable    Clinical Presentation due to:  see flowsheets for objective tests and measures    Clinical Decision Making  Moderate    Rehab Potential  Fair    PT Frequency  2x / week    PT Duration  4 weeks    PT Treatment/Interventions  ADLs/Self Care Home Management;Aquatic Therapy;Cryotherapy;Electrical Stimulation;Moist Heat;Traction;Ultrasound;DME Instruction;Gait training;Stair training;Functional mobility training;Therapeutic activities;Therapeutic exercise;Balance training;Neuromuscular re-education;Patient/family education;Orthotic Fit/Training;Manual techniques;Scar mobilization;Passive range of motion;Dry needling;Energy conservation;Taping;Spinal Manipulations;Joint Manipulations    PT Next Visit Plan  review goals; administer FOTO; encourage pt to obtain 1/8" to 1/4" heel  lift for L foot (don't need referral for her to go buy one); initiate lumbar/thoracic mobility/stretching work, core, funcitonal, BLE strengthening, balance work    PT Home Exercise Plan  eval: supine HS stretch (hands behind thigh), supine piriformis stretch, supine figure 4    Consulted and Agree with Plan of Care  Patient       Patient will benefit from skilled therapeutic intervention in order to improve the following deficits and impairments:  Abnormal gait, Decreased activity tolerance, Decreased balance, Decreased range of motion, Decreased strength, Difficulty walking, Hypomobility, Increased fascial restricitons, Increased muscle spasms, Impaired flexibility, Improper body mechanics, Postural dysfunction, Pain  Visit Diagnosis: Chronic bilateral low back pain without sciatica - Plan: PT plan of care cert/re-cert  Difficulty in walking, not elsewhere classified - Plan: PT plan of care cert/re-cert  Other symptoms and signs involving the musculoskeletal system - Plan: PT plan of care cert/re-cert  Acute pain of left knee - Plan: PT plan of care cert/re-cert     Problem List Patient Active Problem List   Diagnosis Date Noted  . Restless leg syndrome 08/30/2017  . Seasonal allergies 02/22/2017  . Breast mass, left 09/10/2016  . Primary osteoarthritis of right knee   . Medial meniscus, posterior horn derangement   . Dysesthesia 10/15/2014  . Gait disturbance 10/15/2014  . Essential hypertension, benign 05/27/2013  . Multiple sclerosis ([place]) 03/06/2013  . Hypertriglyceridemia 11/02/2012  . Hypokalemia 11/02/2012  . Depression with anxiety 11/02/2012  . DS (disseminated sclerosis) ([place]) 03/22/2011  . DIVERTICULITIS, COLON 08/11/2009  . IRRITABLE BOWEL SYNDROME 08/11/2009        Geraldine Solar PT, DPT  [place] [address] [place], [place], [address] Phone: [phone]   Fax:  [phone]  Name: Krystal Silva MRN:  [id number] Date of Birth: 1955/08/03

## 2018-05-03 ENCOUNTER — Telehealth: Payer: Self-pay | Admitting: *Deleted

## 2018-05-03 MED ORDER — INTERFERON BETA-1A 30 MCG/0.5ML IM AJKT: 30.0000 ug | AUTO-INJECTOR | INTRAMUSCULAR | 3 refills | Status: DC

## 2018-05-03 NOTE — Telephone Encounter (Signed)
[facility] escribed to [facility] in response to faxed request from them/fim

## 2018-05-04 ENCOUNTER — Encounter

## 2018-05-07 ENCOUNTER — Encounter (HOSPITAL_COMMUNITY): Payer: Self-pay

## 2018-05-07 ENCOUNTER — Ambulatory Visit (HOSPITAL_COMMUNITY): Payer: Commercial Managed Care - PPO

## 2018-05-07 DIAGNOSIS — R262 Difficulty in walking, not elsewhere classified: Secondary | ICD-10-CM

## 2018-05-07 DIAGNOSIS — R29898 Other symptoms and signs involving the musculoskeletal system: Secondary | ICD-10-CM

## 2018-05-07 DIAGNOSIS — M545 Low back pain: Principal | ICD-10-CM

## 2018-05-07 DIAGNOSIS — M25562 Pain in left knee: Secondary | ICD-10-CM

## 2018-05-07 DIAGNOSIS — G8929 Other chronic pain: Secondary | ICD-10-CM

## 2018-05-07 NOTE — Therapy (Signed)
[place], [place], [address] Phone: [phone]   Fax:  [phone]  Physical Therapy Treatment  Patient Details  Name: Krystal Silva MRN: [id number] Date of Birth: 11-09-1955 Referring Provider (PT): Arther Abbott, MD   Encounter Date: 05/07/2018  PT End of Session - 05/07/18 0826    Visit Number  2    Number of Visits  9    Date for PT Re-Evaluation  05/25/18    Authorization Type  [employer] PPO    Authorization Time Period  04/27/18 to 05/25/17    Authorization - Visit Number  2    Authorization - Number of Visits  30   11/20/17 to 11/20/18 benefit plan year   PT Start Time  0818    PT Stop Time  0906    PT Time Calculation (min)  48 min    Activity Tolerance  Patient tolerated treatment well    Behavior During Therapy  [facility] for tasks assessed/performed       Past Medical History:  Diagnosis Date  . Allergy   . Diverticulitis   . Hypercholesteremia   . Hypertension   . Insomnia   . MS (multiple sclerosis) ([place])   . Vitamin D deficiency     Past Surgical History:  Procedure Laterality Date  . COLONOSCOPY  08/21/2009  . KNEE ARTHROSCOPY WITH MEDIAL MENISECTOMY Right 03/27/2015   Procedure: KNEE ARTHROSCOPY WITH PARTIAL MEDIAL MENISECTOMY AND MICROFRACTURE;  Surgeon: Carole Civil, MD;  Location: [facility];  Service: Orthopedics;  Laterality: Right;  . TONSILLECTOMY    . WISDOM TOOTH EXTRACTION  1980's    There were no vitals filed for this visit.  Subjective Assessment - 05/07/18 0817    Subjective  Pt stated she has Bil knee pain wiht movements and LBP while sitting at work, pain scale 5/10 knee pain.      Patient Stated Goals  continue to walk and not have tons of pain    Currently in Pain?  Yes    Pain Score  5     Pain Location  Knee    Pain Orientation  Left    Pain Descriptors / Indicators  Sharp   sharp with movement   Pain Type  Chronic pain    Pain Onset  More than a month ago     Pain Frequency  Intermittent   wiht movement   Aggravating Factors   with movement    Pain Relieving Factors  Aleve, movement initially following sit to stand    Effect of Pain on Daily Activities  limits    Multiple Pain Sites  Yes    Pain Score  2    Pain Location  Back    Pain Orientation  Lower    Pain Descriptors / Indicators  Dull    Pain Type  Chronic pain    Pain Onset  More than a month ago    Pain Frequency  Intermittent    Aggravating Factors   sitting at work/ for long periods    Pain Relieving Factors  stretching, Aleve    Effect of Pain on Daily Activities  limits                       [facility] Adult PT Treatment/Exercise - 05/07/18 0001      Exercises   Exercises  Lumbar      Lumbar Exercises: Stretches   Active Hamstring Stretch  2  reps;30 seconds      Lumbar Exercises: Seated   Other Seated Lumbar Exercises  ab set      Lumbar Exercises: Supine   Ab Set  10 reps;3 seconds    AB Set Limitations  cueing for proper form    Bridge  10 reps    Bridge Limitations  2 sets      Manual Therapy   Manual Therapy  Soft tissue mobilization    Manual therapy comments  performed after therex:  supine with LE elevated    Soft tissue mobilization  Lumbar paraspinals and glutes             PT Education - 05/07/18 1255    Education Details  Reviewed goals and assured compliance wiht HEP; FOTO complete; Discussed benefits of heel lift for Lt LE    Person(s) Educated  Patient    Methods  Explanation;Demonstration    Comprehension  Verbalized understanding       PT Short Term Goals - 04/27/18 0933      PT SHORT TERM GOAL #1   Title  Pt will have improved MMT by 1/2 grade throughout in order to reduce pain and maximize gait.    Time  2    Period  Weeks    Status  New    Target Date  05/11/18      PT SHORT TERM GOAL #2   Title  Pt will report being able to walk for 1 hour with 3/10 L knee pain in order to maximize her work duties.    Time  2     Period  Weeks    Status  New      PT SHORT TERM GOAL #3   Title  Pt will report being able to stand for 2 hours with 3/10 L knee pain and LBP in order to maximize her work duties and demo improved functional strength.    Time  2    Period  Weeks    Status  New      PT SHORT TERM GOAL #4   Title  Pt will be able to perform R SLS for 30 sec or > to be symmetrical with her LLE in order to maximize gait and stairs.     Time  2    Period  Weeks    Status  New        PT Long Term Goals - 04/27/18 0934      PT LONG TERM GOAL #1   Title  Pt will have improved MMT by 1 grade throughout in order to further reduce LBP and L knee pain in order to maximize stairs and return to PLOF.     Time  4    Period  Weeks    Status  New    Target Date  05/25/18      PT LONG TERM GOAL #2   Title  Pt will have 185ft improvement in 3MWT with gait WFL and no L knee pain to demo improved tolerance to ambulation.     Time  4    Period  Weeks    Status  New      PT LONG TERM GOAL #3   Title  Pt will report being able to walk for 2 hours without L knee pain in order to further demo improved strength and functional mobility and allow her to return to some of her leisure activities with greater ease.     Time  4    Period  Weeks    Status  New      PT LONG TERM GOAL #4   Title  Pt will report being able to stand for 3 hours without L knee pain or LBP in order to further demo improved functional and core strength in order to further maximize her work duties.    Time  4    Period  Weeks    Status  New            Plan - 05/07/18 0830    Clinical Impression Statement  Reviewed goals and adminstered FOTO to address current self per perceived functional abilities, 44% limitation.  Pt educated on benefits of heel lifts to assist with gait and LBP wiht standing, discussed places for purchase with verbalized understanding.  Session focus on core/proximal strengthening and stretches to address  restrictions.  Multimodal cueing required to improve abdominal compensation and reduce holding breath and gluteal activation.  EOS with manual soft tissue massage to address restrictions in lumbar, gluteal and hamstrings; spasms released Rt piriformis with trigger point release.  No reports of increased pain at EOS.      Rehab Potential  Fair    PT Frequency  2x / week    PT Duration  4 weeks    PT Treatment/Interventions  ADLs/Self Care Home Management;Aquatic Therapy;Cryotherapy;Electrical Stimulation;Moist Heat;Traction;Ultrasound;DME Instruction;Gait training;Stair training;Functional mobility training;Therapeutic activities;Therapeutic exercise;Balance training;Neuromuscular re-education;Patient/family education;Orthotic Fit/Training;Manual techniques;Scar mobilization;Passive range of motion;Dry needling;Energy conservation;Taping;Spinal Manipulations;Joint Manipulations    PT Next Visit Plan  F/U on purchase and proper wear with 1/8" to 1/4" heel lift for L foot (don't need referral for her to go buy one); initiate lumbar/thoracic mobility/stretching work, core, funcitonal, BLE strengthening, balance work    PT Home Exercise Plan  eval: supine HS stretch (hands behind thigh), supine piriformis stretch, supine figure 4       Patient will benefit from skilled therapeutic intervention in order to improve the following deficits and impairments:  Abnormal gait, Decreased activity tolerance, Decreased balance, Decreased range of motion, Decreased strength, Difficulty walking, Hypomobility, Increased fascial restricitons, Increased muscle spasms, Impaired flexibility, Improper body mechanics, Postural dysfunction, Pain  Visit Diagnosis: Chronic bilateral low back pain without sciatica  Difficulty in walking, not elsewhere classified  Other symptoms and signs involving the musculoskeletal system  Acute pain of left knee     Problem List Patient Active Problem List   Diagnosis Date Noted  .  Restless leg syndrome 08/30/2017  . Seasonal allergies 02/22/2017  . Breast mass, left 09/10/2016  . Primary osteoarthritis of right knee   . Medial meniscus, posterior horn derangement   . Dysesthesia 10/15/2014  . Gait disturbance 10/15/2014  . Essential hypertension, benign 05/27/2013  . Multiple sclerosis ([place]) 03/06/2013  . Hypertriglyceridemia 11/02/2012  . Hypokalemia 11/02/2012  . Depression with anxiety 11/02/2012  . DS (disseminated sclerosis) ([place]) 03/22/2011  . DIVERTICULITIS, COLON 08/11/2009  . IRRITABLE BOWEL SYNDROME 08/11/2009   Ihor Austin, LPTA; [place] Aldona Lento 05/07/2018, 1:01 PM  [place] [address] [place], [place], [address] Phone: [phone]   Fax:  [phone]  Name: KEYLE DOBY MRN: [id number] Date of Birth: [birth year]

## 2018-05-07 NOTE — Patient Instructions (Signed)
Pelvic Tilt    Flatten back by tightening stomach muscles and buttocks. Repeat 10 times per set. Do 1-2 sets per session.   http://orth.exer.us/135   Copyright  VHI. All rights reserved.

## 2018-05-08 ENCOUNTER — Other Ambulatory Visit: Payer: Self-pay | Admitting: *Deleted

## 2018-05-08 MED ORDER — INTERFERON BETA-1A 30 MCG/0.5ML IM AJKT: 30.0000 ug | AUTO-INJECTOR | INTRAMUSCULAR | 3 refills | Status: DC

## 2018-05-09 ENCOUNTER — Ambulatory Visit (HOSPITAL_COMMUNITY): Payer: Commercial Managed Care - PPO

## 2018-05-09 ENCOUNTER — Telehealth (HOSPITAL_COMMUNITY): Payer: Self-pay

## 2018-05-09 NOTE — Telephone Encounter (Signed)
She is sick today and can not come in °

## 2018-05-14 ENCOUNTER — Ambulatory Visit: Payer: Commercial Managed Care - PPO | Admitting: Orthopedic Surgery

## 2018-05-15 ENCOUNTER — Ambulatory Visit (HOSPITAL_COMMUNITY): Payer: Commercial Managed Care - PPO

## 2018-05-15 ENCOUNTER — Other Ambulatory Visit: Payer: Self-pay

## 2018-05-15 ENCOUNTER — Encounter (HOSPITAL_COMMUNITY): Payer: Self-pay

## 2018-05-15 DIAGNOSIS — G8929 Other chronic pain: Secondary | ICD-10-CM

## 2018-05-15 DIAGNOSIS — R262 Difficulty in walking, not elsewhere classified: Secondary | ICD-10-CM

## 2018-05-15 DIAGNOSIS — M545 Low back pain: Secondary | ICD-10-CM | POA: Diagnosis not present

## 2018-05-15 DIAGNOSIS — R29898 Other symptoms and signs involving the musculoskeletal system: Secondary | ICD-10-CM

## 2018-05-15 NOTE — Patient Instructions (Signed)
Supine Bridge reps: 10-15 sets: 2 hold: 3 seconds daily: 1  weekly: 7      Exercise image step 1   Exercise image step 2  Setup  Begin lying on your back with your arms resting at your sides, your legs bent at the knees and your feet flat on the ground. Movement  Tighten your abdominals and slowly lift your hips off the floor into a bridge position, keeping your back straight. Tip  Make sure to keep your trunk stiff throughout the exercise and your arms flat on the floor. Clamshell with Resistance reps: 10-15 sets: 2 hold: 3 seconds daily: 1  weekly: 7      Exercise image step 1   Exercise image step 2  Setup  Begin by lying on your side with your knees bent 90 degrees, hips and shoulders stacked, and a resistance loop secured around your legs.  Movement  Raise your top knee away from the bottom one, then slowly return to the starting position.  Tip  Make sure not to roll your hips forward or backward during the exercise.

## 2018-05-15 NOTE — Therapy (Signed)
[place], [place], [address] Phone: [phone]   Fax:  [phone]  Physical Therapy Treatment  Patient Details  Name: Krystal Silva MRN: [id number] Date of Birth: Sep 24, 1955 Referring Provider (PT): Arther Abbott, MD   Encounter Date: 05/15/2018  PT End of Session - 05/15/18 1128    Visit Number  3    Number of Visits  9    Date for PT Re-Evaluation  05/25/18    Authorization Type  [employer] PPO    Authorization Time Period  04/27/18 to 05/25/17    Authorization - Visit Number  3    Authorization - Number of Visits  30   11/20/17 to 11/20/18 benefit plan year   PT Start Time  1122    PT Stop Time  1201    PT Time Calculation (min)  39 min    Activity Tolerance  Patient tolerated treatment well    Behavior During Therapy  [facility] for tasks assessed/performed       Past Medical History:  Diagnosis Date  . Allergy   . Diverticulitis   . Hypercholesteremia   . Hypertension   . Insomnia   . MS (multiple sclerosis) ([place])   . Vitamin D deficiency     Past Surgical History:  Procedure Laterality Date  . COLONOSCOPY  08/21/2009  . KNEE ARTHROSCOPY WITH MEDIAL MENISECTOMY Right 03/27/2015   Procedure: KNEE ARTHROSCOPY WITH PARTIAL MEDIAL MENISECTOMY AND MICROFRACTURE;  Surgeon: Carole Civil, MD;  Location: [facility];  Service: Orthopedics;  Laterality: Right;  . TONSILLECTOMY    . WISDOM TOOTH EXTRACTION  1980's    There were no vitals filed for this visit.  Subjective Assessment - 05/15/18 1125    Subjective  Patient reports her back is doing alright today and that she is not having a lot of pain. Her knee's feel stiff today but dont' really hurt. She reports she has not been able to do her exercises as she has been sick since her last visit.     Limitations  Walking;Standing    Patient Stated Goals  continue to walk and not have tons of pain    Currently in Pain?  Yes    Pain Score  4     Pain Location   Knee    Pain Orientation  Right;Left;Medial    Pain Descriptors / Indicators  --   stiff   Pain Type  Chronic pain    Pain Onset  More than a month ago    Pain Frequency  Intermittent    Aggravating Factors   walking, movement    Pain Relieving Factors  medicine, transitional movement        [facility] Adult PT Treatment/Exercise - 05/15/18 0001      Exercises   Exercises  Lumbar      Lumbar Exercises: Stretches   Active Hamstring Stretch  Right;Left;3 reps;30 seconds    Active Hamstring Stretch Limitations  supine with rope    Lower Trunk Rotation  5 reps;10 seconds      Lumbar Exercises: Standing   Other Standing Lumbar Exercises  Hip extension and abduction with red theraband, 1x 10 reps bil LE's    Other Standing Lumbar Exercises  Wall arch: 1x 15 reps      Lumbar Exercises: Supine   Bent Knee Raise  15 reps;Limitations    Bent Knee Raise Limitations  with ab set    Bridge  15 reps;3 seconds  Bridge Limitations  2 sets         05/15/18 1204  Balance Exercises: Standing  Tandem Stance Eyes open;Foam/compliant surface;Intermittent upper extremity support (bilaterally, 1x 10 reps 2# dowel flexion)    PT Education - 05/15/18 1128    Education Details  Reviewed exercises and provided cues for form. Updated HEP and provided handout and red band.    Person(s) Educated  Patient    Methods  Explanation    Comprehension  Verbalized understanding;Returned demonstration       PT Short Term Goals - 04/27/18 0933      PT SHORT TERM GOAL #1   Title  Pt will have improved MMT by 1/2 grade throughout in order to reduce pain and maximize gait.    Time  2    Period  Weeks    Status  New    Target Date  05/11/18      PT SHORT TERM GOAL #2   Title  Pt will report being able to walk for 1 hour with 3/10 L knee pain in order to maximize her work duties.    Time  2    Period  Weeks    Status  New      PT SHORT TERM GOAL #3   Title  Pt will report being able to stand for 2  hours with 3/10 L knee pain and LBP in order to maximize her work duties and demo improved functional strength.    Time  2    Period  Weeks    Status  New      PT SHORT TERM GOAL #4   Title  Pt will be able to perform R SLS for 30 sec or > to be symmetrical with her LLE in order to maximize gait and stairs.     Time  2    Period  Weeks    Status  New        PT Long Term Goals - 04/27/18 0934      PT LONG TERM GOAL #1   Title  Pt will have improved MMT by 1 grade throughout in order to further reduce LBP and L knee pain in order to maximize stairs and return to PLOF.     Time  4    Period  Weeks    Status  New    Target Date  05/25/18      PT LONG TERM GOAL #2   Title  Pt will have 145ft improvement in 3MWT with gait WFL and no L knee pain to demo improved tolerance to ambulation.     Time  4    Period  Weeks    Status  New      PT LONG TERM GOAL #3   Title  Pt will report being able to walk for 2 hours without L knee pain in order to further demo improved strength and functional mobility and allow her to return to some of her leisure activities with greater ease.     Time  4    Period  Weeks    Status  New      PT LONG TERM GOAL #4   Title  Pt will report being able to stand for 3 hours without L knee pain or LBP in order to further demo improved functional and core strength in order to further maximize her work duties.    Time  4    Period  Weeks    Status  New        Plan - 05/15/18 1132    Clinical Impression Statement  Continued with current POC and focused on lumbar mobility with stretches and on hip strengthening. Patient progressed clamshell exercise with theraband and denies pain with increased challenge. She continued with core/abdominal strengthening and initiated standing hip exercises and balance training today. She required intermittent UE support throughout tandem stance challenge and cues to slow down to facilitate more controlled movements. HEP updated  today with hip strengthening and patient denied pain at EOS. She will continue to benefit from skilled PT interventions to address impairments and improve mobility and QOL.    Rehab Potential  Fair    PT Frequency  2x / week    PT Duration  4 weeks    PT Treatment/Interventions  ADLs/Self Care Home Management;Aquatic Therapy;Cryotherapy;Electrical Stimulation;Moist Heat;Traction;Ultrasound;DME Instruction;Gait training;Stair training;Functional mobility training;Therapeutic activities;Therapeutic exercise;Balance training;Neuromuscular re-education;Patient/family education;Orthotic Fit/Training;Manual techniques;Scar mobilization;Passive range of motion;Dry needling;Energy conservation;Taping;Spinal Manipulations;Joint Manipulations    PT Next Visit Plan  Encourage patient to purchase and wear 1/8" to 1/4" heel lift for Lt foot. Continue lumbar/thoracic mobility/stretching and functional hip and core strengthening. Add SLS and balance challenges.     PT Home Exercise Plan  eval: supine HS stretch (hands behind thigh), supine piriformis stretch, supine figure 4    Consulted and Agree with Plan of Care  Patient       Patient will benefit from skilled therapeutic intervention in order to improve the following deficits and impairments:  Abnormal gait, Decreased activity tolerance, Decreased balance, Decreased range of motion, Decreased strength, Difficulty walking, Hypomobility, Increased fascial restricitons, Increased muscle spasms, Impaired flexibility, Improper body mechanics, Postural dysfunction, Pain  Visit Diagnosis: Chronic bilateral low back pain without sciatica  Difficulty in walking, not elsewhere classified  Other symptoms and signs involving the musculoskeletal system     Problem List Patient Active Problem List   Diagnosis Date Noted  . Restless leg syndrome 08/30/2017  . Seasonal allergies 02/22/2017  . Breast mass, left 09/10/2016  . Primary osteoarthritis of right knee    . Medial meniscus, posterior horn derangement   . Dysesthesia 10/15/2014  . Gait disturbance 10/15/2014  . Essential hypertension, benign 05/27/2013  . Multiple sclerosis ([place]) 03/06/2013  . Hypertriglyceridemia 11/02/2012  . Hypokalemia 11/02/2012  . Depression with anxiety 11/02/2012  . DS (disseminated sclerosis) ([place]) 03/22/2011  . DIVERTICULITIS, COLON 08/11/2009  . IRRITABLE BOWEL SYNDROME 08/11/2009    Kipp Brood, PT, DPT Physical Therapist with [facility]  05/15/2018 12:12 PM    [place] [address] [place], [place], [address] Phone: [phone]   Fax:  [phone]  Name: Krystal Silva MRN: [id number] Date of Birth: [birth year]

## 2018-05-17 ENCOUNTER — Ambulatory Visit (HOSPITAL_COMMUNITY): Payer: Commercial Managed Care - PPO

## 2018-05-17 ENCOUNTER — Encounter (HOSPITAL_COMMUNITY): Payer: Self-pay

## 2018-05-17 DIAGNOSIS — M545 Low back pain: Principal | ICD-10-CM

## 2018-05-17 DIAGNOSIS — G8929 Other chronic pain: Secondary | ICD-10-CM

## 2018-05-17 DIAGNOSIS — R29898 Other symptoms and signs involving the musculoskeletal system: Secondary | ICD-10-CM

## 2018-05-17 DIAGNOSIS — R262 Difficulty in walking, not elsewhere classified: Secondary | ICD-10-CM

## 2018-05-17 DIAGNOSIS — M25562 Pain in left knee: Secondary | ICD-10-CM

## 2018-05-17 NOTE — Therapy (Signed)
[place], [place], [address] Phone: [phone]   Fax:  [phone]  Physical Therapy Treatment  Patient Details  Name: Krystal Silva MRN: [id number] Date of Birth: 01/20/1956 Referring Provider (PT): Arther Abbott, MD   Encounter Date: 05/17/2018  PT End of Session - 05/17/18 1118    Visit Number  4    Number of Visits  9    Date for PT Re-Evaluation  05/25/18    Authorization Type  [employer] PPO    Authorization Time Period  04/27/18 to 05/25/17    Authorization - Visit Number  4    Authorization - Number of Visits  30   11/20/17 to 11/20/18 benefit plan year   PT Start Time  1118    PT Stop Time  1200    PT Time Calculation (min)  42 min    Activity Tolerance  Patient tolerated treatment well    Behavior During Therapy  [facility] for tasks assessed/performed       Past Medical History:  Diagnosis Date  . Allergy   . Diverticulitis   . Hypercholesteremia   . Hypertension   . Insomnia   . MS (multiple sclerosis) ([place])   . Vitamin D deficiency     Past Surgical History:  Procedure Laterality Date  . COLONOSCOPY  08/21/2009  . KNEE ARTHROSCOPY WITH MEDIAL MENISECTOMY Right 03/27/2015   Procedure: KNEE ARTHROSCOPY WITH PARTIAL MEDIAL MENISECTOMY AND MICROFRACTURE;  Surgeon: Carole Civil, MD;  Location: [facility];  Service: Orthopedics;  Laterality: Right;  . TONSILLECTOMY    . WISDOM TOOTH EXTRACTION  1980's    There were no vitals filed for this visit.  Subjective Assessment - 05/17/18 1119    Subjective  Pt states that her back was tight and sore yesterday and her L knee is bothering her today. Back feels much better today.    Limitations  Walking;Standing    Patient Stated Goals  continue to walk and not have tons of pain    Currently in Pain?  Yes    Pain Score  5     Pain Location  Knee    Pain Orientation  Left;Medial    Pain Descriptors / Indicators  --   stiff   Pain Type  Chronic pain    Pain Onset  More than a month ago    Pain Frequency  Intermittent    Aggravating Factors   walking, movement    Pain Relieving Factors  medicine, transitional movement    Effect of Pain on Daily Activities  limits            [facility] Adult PT Treatment/Exercise - 05/17/18 0001      Exercises   Exercises  Lumbar      Lumbar Exercises: Stretches   Quad Stretch  Right;Left;2 reps;30 seconds    Quad Stretch Limitations  prone with rope    Other Lumbar Stretch Exercise  fwd, R/L seated lumbar flexion stretch 5x10" each directon      Lumbar Exercises: Standing   Shoulder Extension Limitations  bil tandem stance firm +paloff press x15 reps each     Shoulder Adduction Limitations  sidestepping RTB 34ft x2RT    Other Standing Lumbar Exercises  bil hip abd and diagonals RTB 2x10 reps each      Lumbar Exercises: Seated   Other Seated Lumbar Exercises  3D thoracic excursions 10x5" holds each      Manual Therapy   Manual  Therapy  Joint mobilization    Manual therapy comments  separate rest of treatment    Joint Mobilization  Thoracic and lumbar CPAs Grade III to reduce pain and improve mobility           PT Education - 05/17/18 1119    Education Details  exercise technique, continue HEP, will reassess next week    Person(s) Educated  Patient    Methods  Explanation;Demonstration    Comprehension  Verbalized understanding;Returned demonstration       PT Short Term Goals - 04/27/18 0933      PT SHORT TERM GOAL #1   Title  Pt will have improved MMT by 1/2 grade throughout in order to reduce pain and maximize gait.    Time  2    Period  Weeks    Status  New    Target Date  05/11/18      PT SHORT TERM GOAL #2   Title  Pt will report being able to walk for 1 hour with 3/10 L knee pain in order to maximize her work duties.    Time  2    Period  Weeks    Status  New      PT SHORT TERM GOAL #3   Title  Pt will report being able to stand for 2 hours with 3/10 L knee pain and LBP  in order to maximize her work duties and demo improved functional strength.    Time  2    Period  Weeks    Status  New      PT SHORT TERM GOAL #4   Title  Pt will be able to perform R SLS for 30 sec or > to be symmetrical with her LLE in order to maximize gait and stairs.     Time  2    Period  Weeks    Status  New        PT Long Term Goals - 04/27/18 0934      PT LONG TERM GOAL #1   Title  Pt will have improved MMT by 1 grade throughout in order to further reduce LBP and L knee pain in order to maximize stairs and return to PLOF.     Time  4    Period  Weeks    Status  New    Target Date  05/25/18      PT LONG TERM GOAL #2   Title  Pt will have 132ft improvement in 3MWT with gait WFL and no L knee pain to demo improved tolerance to ambulation.     Time  4    Period  Weeks    Status  New      PT LONG TERM GOAL #3   Title  Pt will report being able to walk for 2 hours without L knee pain in order to further demo improved strength and functional mobility and allow her to return to some of her leisure activities with greater ease.     Time  4    Period  Weeks    Status  New      PT LONG TERM GOAL #4   Title  Pt will report being able to stand for 3 hours without L knee pain or LBP in order to further demo improved functional and core strength in order to further maximize her work duties.    Time  4    Period  Weeks    Status  New  Plan - 05/17/18 1202    Clinical Impression Statement  Began with mobility exercises for thoracic and lumbar spine in order to improve overall ROM and reduce soft tissue restrictions. Continued with standing hip strengthening, progressing to standing core activities as well. No pain reported throughout therex. Pt challenged with paloff press in tandem stance. Ended with manual joint mobs and STM to further address joint mobility. Added 3D thoracic excursions to HEP. Continue as planned, progressing as able.     Rehab Potential  Fair     PT Frequency  2x / week    PT Duration  4 weeks    PT Treatment/Interventions  ADLs/Self Care Home Management;Aquatic Therapy;Cryotherapy;Electrical Stimulation;Moist Heat;Traction;Ultrasound;DME Instruction;Gait training;Stair training;Functional mobility training;Therapeutic activities;Therapeutic exercise;Balance training;Neuromuscular re-education;Patient/family education;Orthotic Fit/Training;Manual techniques;Scar mobilization;Passive range of motion;Dry needling;Energy conservation;Taping;Spinal Manipulations;Joint Manipulations    PT Next Visit Plan  Encourage patient to purchase and wear 1/8" to 1/4" heel lift for Lt foot. Continue lumbar/thoracic mobility/stretching and functional hip and core strengthening. Add SLS and balance challenges.     PT Home Exercise Plan  eval: supine HS stretch (hands behind thigh), supine piriformis stretch, supine figure 4    Consulted and Agree with Plan of Care  Patient       Patient will benefit from skilled therapeutic intervention in order to improve the following deficits and impairments:  Abnormal gait, Decreased activity tolerance, Decreased balance, Decreased range of motion, Decreased strength, Difficulty walking, Hypomobility, Increased fascial restricitons, Increased muscle spasms, Impaired flexibility, Improper body mechanics, Postural dysfunction, Pain  Visit Diagnosis: Chronic bilateral low back pain without sciatica  Difficulty in walking, not elsewhere classified  Other symptoms and signs involving the musculoskeletal system  Acute pain of left knee     Problem List Patient Active Problem List   Diagnosis Date Noted  . Restless leg syndrome 08/30/2017  . Seasonal allergies 02/22/2017  . Breast mass, left 09/10/2016  . Primary osteoarthritis of right knee   . Medial meniscus, posterior horn derangement   . Dysesthesia 10/15/2014  . Gait disturbance 10/15/2014  . Essential hypertension, benign 05/27/2013  . Multiple  sclerosis ([place]) 03/06/2013  . Hypertriglyceridemia 11/02/2012  . Hypokalemia 11/02/2012  . Depression with anxiety 11/02/2012  . DS (disseminated sclerosis) ([place]) 03/22/2011  . DIVERTICULITIS, COLON 08/11/2009  . IRRITABLE BOWEL SYNDROME 08/11/2009        Geraldine Solar PT, DPT  [place] [address] [place], [place], [address] Phone: [phone]   Fax:  [phone]  Name: KOSHA JAQUITH MRN: [id number] Date of Birth: 12-11-1955

## 2018-05-21 ENCOUNTER — Telehealth: Payer: Self-pay | Admitting: *Deleted

## 2018-05-21 NOTE — Telephone Encounter (Signed)
Called, LVM for pt letting her know to contact [facility] that is trying to reach out to her to schedule shipment for Avonex. Gave her their phone number: [phone].

## 2018-05-22 ENCOUNTER — Ambulatory Visit (HOSPITAL_COMMUNITY): Payer: Commercial Managed Care - PPO

## 2018-05-22 ENCOUNTER — Encounter (HOSPITAL_COMMUNITY): Payer: Self-pay

## 2018-05-22 DIAGNOSIS — R262 Difficulty in walking, not elsewhere classified: Secondary | ICD-10-CM

## 2018-05-22 DIAGNOSIS — M25562 Pain in left knee: Secondary | ICD-10-CM

## 2018-05-22 DIAGNOSIS — M545 Low back pain, unspecified: Secondary | ICD-10-CM

## 2018-05-22 DIAGNOSIS — R29898 Other symptoms and signs involving the musculoskeletal system: Secondary | ICD-10-CM

## 2018-05-22 DIAGNOSIS — G8929 Other chronic pain: Secondary | ICD-10-CM

## 2018-05-22 NOTE — Therapy (Signed)
[place], [place], [address] Phone: [phone]   Fax:  [phone]   Progress Note Reporting Period 04/27/18 to 05/22/18  See note below for Objective Data and Assessment of Progress/Goals.   Physical Therapy Treatment  Patient Details  Name: Krystal Silva MRN: [id number] Date of Birth: January 03, 1956 Referring Provider (PT): Arther Abbott, MD   Encounter Date: 05/22/2018  PT End of Session - 05/22/18 0817    Visit Number  5    Number of Visits  17    Date for PT Re-Evaluation  06/19/18    Authorization Type  [employer] PPO    Authorization Time Period  04/27/18 to 05/25/17; NEW: 05/22/18 to 06/19/18    Authorization - Visit Number  5    Authorization - Number of Visits  30   11/20/17 to 11/20/18 benefit plan year   PT Start Time  0818    PT Stop Time  0858    PT Time Calculation (min)  40 min    Activity Tolerance  Patient tolerated treatment well    Behavior During Therapy  [facility] for tasks assessed/performed       Past Medical History:  Diagnosis Date  . Allergy   . Diverticulitis   . Hypercholesteremia   . Hypertension   . Insomnia   . MS (multiple sclerosis) ([place])   . Vitamin D deficiency     Past Surgical History:  Procedure Laterality Date  . COLONOSCOPY  08/21/2009  . KNEE ARTHROSCOPY WITH MEDIAL MENISECTOMY Right 03/27/2015   Procedure: KNEE ARTHROSCOPY WITH PARTIAL MEDIAL MENISECTOMY AND MICROFRACTURE;  Surgeon: Carole Civil, MD;  Location: [facility];  Service: Orthopedics;  Laterality: Right;  . TONSILLECTOMY    . WISDOM TOOTH EXTRACTION  1980's    There were no vitals filed for this visit.  Subjective Assessment - 05/22/18 0819    Subjective  Pt states that her L knee is giving her a fit this morning because she slipped on her deck from the morning dew and landed on her L side. She states that she was sore from the last session. No pain right now, but her knee can catch/twinge and it will be  a 6/10.     Limitations  Walking;Standing    Patient Stated Goals  continue to walk and not have tons of pain    Currently in Pain?  No/denies         [facility] PT Assessment - 05/22/18 0001      Assessment   Medical Diagnosis  DDD, lumbar    Referring Provider (PT)  Arther Abbott, MD    Onset Date/Surgical Date  --   couple months ago   Next MD Visit  06/14/18    Prior Therapy  yes after R knee menisectomy      Strength   Right Hip Flexion  5/5   was 4+   Right Hip Extension  4/5   was 4-   Right Hip ABduction  4+/5   was 4   Left Hip Flexion  5/5   was 4   Left Hip Extension  4/5   was4-   Left Hip ABduction  4/5   was 4   Right Knee Flexion  4+/5   was 4+   Right Knee Extension  4+/5   was 4+   Left Knee Flexion  4+/5   was 4+   Left Knee Extension  5/5    Right Ankle Dorsiflexion  4+/5   was 4+   Left Ankle Dorsiflexion  5/5   was 4+     Ambulation/Gait   Ambulation Distance (Feet)  710 Feet   3MWT; was 646f    Assistive device  None    Gait Pattern  Step-through pattern;Trendelenburg;Antalgic;Lateral trunk lean to left      Balance   Balance Assessed  Yes      Static Standing Balance   Static Standing - Balance Support  No upper extremity supported    Static Standing Balance -  Activities   Single Leg Stance - Right Leg;Single Leg Stance - Left Leg    Static Standing - Comment/# of Minutes  R: 10.66sec or < L: 34.8sec   was 9.5 on R, 32 on L            [facility] Adult PT Treatment/Exercise - 05/22/18 0001      Modalities   Modalities  Cryotherapy      Cryotherapy   Number Minutes Cryotherapy  3 Minutes    Cryotherapy Location  Knee    Type of Cryotherapy  Ice massage   education on how to perform at home     Manual Therapy   Manual Therapy  Soft tissue mobilization    Manual therapy comments  separate rest of treatment    Soft tissue mobilization  STM L medial and distal quad to reduce pain and restrictions             PT  Education - 05/22/18 0818    Education Details  reassessment findings, HEP    Person(s) Educated  Patient    Methods  Explanation;Demonstration;Handout    Comprehension  Verbalized understanding;Returned demonstration       PT Short Term Goals - 05/22/18 0823      PT SHORT TERM GOAL #1   Title  Pt will have improved MMT by 1/2 grade throughout in order to reduce pain and maximize gait.    Time  2    Period  Weeks    Status  Partially Met      PT SHORT TERM GOAL #2   Title  Pt will report being able to walk for 1 hour with 3/10 L knee pain in order to maximize her work duties.    Baseline  12/31: has been averaging about 1 hour, with pain about 4/10    Time  2    Period  Weeks    Status  Partially Met      PT SHORT TERM GOAL #3   Title  Pt will report being able to stand for 2 hours with 3/10 L knee pain and LBP in order to maximize her work duties and demo improved functional strength.    Baseline  12/31: 1 hour, 4/10 LBP and L knee pain    Time  2    Period  Weeks    Status  On-going      PT SHORT TERM GOAL #4   Title  Pt will be able to perform R SLS for 30 sec or > to be symmetrical with her LLE in order to maximize gait and stairs.     Baseline  12/31: 10.6 RLE, 34 LLE    Time  2    Period  Weeks    Status  On-going        PT Long Term Goals - 05/22/18 01833     PT LONG TERM GOAL #1   Title  Pt will have  improved MMT by 1 grade throughout in order to further reduce LBP and L knee pain in order to maximize stairs and return to PLOF.     Time  4    Period  Weeks    Status  On-going      PT LONG TERM GOAL #2   Title  Pt will have 113f improvement in 3MWT with gait WFL and no L knee pain to demo improved tolerance to ambulation.     Baseline  12/31: 7145f continued gait deviations and L knee pain    Time  4    Period  Weeks    Status  On-going      PT LONG TERM GOAL #3   Title  Pt will report being able to walk for 2 hours without L knee pain in order to  further demo improved strength and functional mobility and allow her to return to some of her leisure activities with greater ease.     Baseline  12/31: has been averaging about 1 hour, with pain about 4/10    Time  4    Period  Weeks    Status  On-going      PT LONG TERM GOAL #4   Title  Pt will report being able to stand for 3 hours without L knee pain or LBP in order to further demo improved functional and core strength in order to further maximize her work duties.    Baseline  12/31: 1 hour, 4/10 LBP and L knee pain    Time  4    Period  Weeks    Status  On-going            Plan - 05/22/18 0900    Clinical Impression Statement  PT reassessed pt's goals and outcome measures this date. Pt has made good progress towards goals as illustrated above. Her MMT has improved by 1/2 grade throughout most mm groups tested, her 3MWT improved by ~3074fand her tolerance to ambulation has slightly improved. Balance is still limited and her pain in her lower back and L knee are still present, which are her main limiting factors. Her gait mechanics are still limited, but PT feels this is due to her LLD; encouraged her to obtain a heel lift to address this and she stated that every time she goes to the store to get one, it is closed but she will continue to try. Pt needs continued skilled PT intervention to address her remaining limitations and further improve hip strength so as to reduce LBP and L knee pain. Ended session with manual STM to L knee and ice massage to reduce L knee pain; educated pt on how to perform ice massage at home for pain control and she verbalized understanding.     Rehab Potential  Fair    PT Frequency  2x / week    PT Duration  4 weeks    PT Treatment/Interventions  ADLs/Self Care Home Management;Aquatic Therapy;Cryotherapy;Electrical Stimulation;Moist Heat;Traction;Ultrasound;DME Instruction;Gait training;Stair training;Functional mobility training;Therapeutic  activities;Therapeutic exercise;Balance training;Neuromuscular re-education;Patient/family education;Orthotic Fit/Training;Manual techniques;Scar mobilization;Passive range of motion;Dry needling;Energy conservation;Taping;Spinal Manipulations;Joint Manipulations    PT Next Visit Plan  Encourage patient to purchase and wear 1/8" to 1/4" heel lift for Lt foot. Continue lumbar/thoracic mobility/stretching and functional hip and core strengthening. Add SLS and balance challenges.     PT Home Exercise Plan  eval: supine HS stretch (hands behind thigh), supine piriformis stretch, supine figure 4; 12/31: prone quad stretch  Consulted and Agree with Plan of Care  Patient       Patient will benefit from skilled therapeutic intervention in order to improve the following deficits and impairments:  Abnormal gait, Decreased activity tolerance, Decreased balance, Decreased range of motion, Decreased strength, Difficulty walking, Hypomobility, Increased fascial restricitons, Increased muscle spasms, Impaired flexibility, Improper body mechanics, Postural dysfunction, Pain  Visit Diagnosis: Chronic bilateral low back pain without sciatica - Plan: PT plan of care cert/re-cert  Difficulty in walking, not elsewhere classified - Plan: PT plan of care cert/re-cert  Other symptoms and signs involving the musculoskeletal system - Plan: PT plan of care cert/re-cert  Acute pain of left knee - Plan: PT plan of care cert/re-cert     Problem List Patient Active Problem List   Diagnosis Date Noted  . Restless leg syndrome 08/30/2017  . Seasonal allergies 02/22/2017  . Breast mass, left 09/10/2016  . Primary osteoarthritis of right knee   . Medial meniscus, posterior horn derangement   . Dysesthesia 10/15/2014  . Gait disturbance 10/15/2014  . Essential hypertension, benign 05/27/2013  . Multiple sclerosis ([place]) 03/06/2013  . Hypertriglyceridemia 11/02/2012  . Hypokalemia 11/02/2012  . Depression with  anxiety 11/02/2012  . DS (disseminated sclerosis) ([place]) 03/22/2011  . DIVERTICULITIS, COLON 08/11/2009  . IRRITABLE BOWEL SYNDROME 08/11/2009       Geraldine Solar PT, DPT  [place] [address] [place], [place], [address] Phone: [phone]   Fax:  [phone]  Name: Krystal Silva MRN: [id number] Date of Birth: 23-Feb-1956

## 2018-05-24 ENCOUNTER — Encounter (HOSPITAL_COMMUNITY): Payer: Self-pay

## 2018-05-24 ENCOUNTER — Ambulatory Visit (HOSPITAL_COMMUNITY): Payer: Commercial Managed Care - PPO | Attending: Orthopedic Surgery

## 2018-05-24 DIAGNOSIS — R262 Difficulty in walking, not elsewhere classified: Secondary | ICD-10-CM | POA: Insufficient documentation

## 2018-05-24 DIAGNOSIS — M545 Low back pain: Secondary | ICD-10-CM | POA: Diagnosis not present

## 2018-05-24 DIAGNOSIS — G8929 Other chronic pain: Secondary | ICD-10-CM | POA: Insufficient documentation

## 2018-05-24 DIAGNOSIS — R29898 Other symptoms and signs involving the musculoskeletal system: Secondary | ICD-10-CM | POA: Diagnosis present

## 2018-05-24 DIAGNOSIS — M25562 Pain in left knee: Secondary | ICD-10-CM | POA: Diagnosis present

## 2018-05-24 NOTE — Therapy (Signed)
Krystal Silva, [place], [address] Phone: [phone]   Fax:  [phone]  Physical Therapy Treatment  Patient Details  Name: Krystal Silva MRN: [id number] Date of Birth: 1955/12/31 Referring Provider (PT): Arther Abbott, MD   Encounter Date: 05/24/2018  PT End of Session - 05/24/18 1731    Visit Number  6    Number of Visits  17    Date for PT Re-Evaluation  06/19/18    Authorization Type  [employer] PPO    Authorization Time Period  04/27/18 to 05/25/17; NEW: 05/22/18 to 06/19/18    Authorization - Visit Number  6    Authorization - Number of Visits  30   11/20/17-->11/20/2018 benefit plan year   PT Start Time  1728    PT Stop Time  1810    PT Time Calculation (min)  42 min    Activity Tolerance  Patient tolerated treatment well    Behavior During Therapy  [facility] for tasks assessed/performed       Past Medical History:  Diagnosis Date  . Allergy   . Diverticulitis   . Hypercholesteremia   . Hypertension   . Insomnia   . MS (multiple sclerosis) ([place])   . Vitamin D deficiency     Past Surgical History:  Procedure Laterality Date  . COLONOSCOPY  08/21/2009  . KNEE ARTHROSCOPY WITH MEDIAL MENISECTOMY Right 03/27/2015   Procedure: KNEE ARTHROSCOPY WITH PARTIAL MEDIAL MENISECTOMY AND MICROFRACTURE;  Surgeon: Carole Civil, MD;  Location: [facility];  Service: Orthopedics;  Laterality: Right;  . TONSILLECTOMY    . WISDOM TOOTH EXTRACTION  1980's    There were no vitals filed for this visit.  Subjective Assessment - 05/24/18 1728    Subjective  Pt stated her Lt knee is bothering her today, sharp pain with movements pain scale 8/10.      Patient Stated Goals  continue to walk and not have tons of pain    Currently in Pain?  Yes    Pain Score  8     Pain Location  Knee    Pain Orientation  Left;Medial    Pain Descriptors / Indicators  Sharp    Pain Type  Chronic pain    Pain Onset  More than a month ago    Pain  Frequency  Intermittent    Aggravating Factors   walking, movement    Pain Relieving Factors  medicine, transitional movement    Effect of Pain on Daily Activities  limits                       [facility] Adult PT Treatment/Exercise - 05/24/18 0001      Lumbar Exercises: Stretches   Active Hamstring Stretch  Right;Left;3 reps;30 seconds    Active Hamstring Stretch Limitations  supine with rope    Quad Stretch  Right;Left;2 reps;30 seconds    Quad Stretch Limitations  prone with rope    Other Lumbar Stretch Exercise  fwd, R/L seated lumbar flexion stretch 5x10" each directon      Lumbar Exercises: Standing   Other Standing Lumbar Exercises  sidestep wiht RTB, reports pain going Lt so ended early    Other Standing Lumbar Exercises  tandem stance on foam Palvo 4 sets x 10 reps      Lumbar Exercises: Seated   Other Seated Lumbar Exercises  3D thoracic excursions 10x5" holds each      Lumbar Exercises: Supine  Bridge  15 reps;3 seconds    Bridge Limitations  2 sets      Lumbar Exercises: Sidelying   Hip Abduction  Right;Left;15 reps;3 seconds    Hip Abduction Weights (lbs)  rose wall on mat             PT Education - 05/24/18 1815    Education Details  Discussed benefits with heel lift for LLD;  Educated on RICE technqiues to assist with pain and edema with knee.      Person(s) Educated  Patient    Methods  Explanation    Comprehension  Verbalized understanding       PT Short Term Goals - 05/22/18 0823      PT SHORT TERM GOAL #1   Title  Pt will have improved MMT by 1/2 grade throughout in order to reduce pain and maximize gait.    Time  2    Period  Weeks    Status  Partially Met      PT SHORT TERM GOAL #2   Title  Pt will report being able to walk for 1 hour with 3/10 L knee pain in order to maximize her work duties.    Baseline  12/31: has been averaging about 1 hour, with pain about 4/10    Time  2    Period  Weeks    Status  Partially Met       PT SHORT TERM GOAL #3   Title  Pt will report being able to stand for 2 hours with 3/10 L knee pain and LBP in order to maximize her work duties and demo improved functional strength.    Baseline  12/31: 1 hour, 4/10 LBP and L knee pain    Time  2    Period  Weeks    Status  On-going      PT SHORT TERM GOAL #4   Title  Pt will be able to perform R SLS for 30 sec or > to be symmetrical with her LLE in order to maximize gait and stairs.     Baseline  12/31: 10.6 RLE, 34 LLE    Time  2    Period  Weeks    Status  On-going        PT Long Term Goals - 05/22/18 1779      PT LONG TERM GOAL #1   Title  Pt will have improved MMT by 1 grade throughout in order to further reduce LBP and L knee pain in order to maximize stairs and return to PLOF.     Time  4    Period  Weeks    Status  On-going      PT LONG TERM GOAL #2   Title  Pt will have 164f improvement in 3MWT with gait WFL and no L knee pain to demo improved tolerance to ambulation.     Baseline  12/31: 7155f continued gait deviations and L knee pain    Time  4    Period  Weeks    Status  On-going      PT LONG TERM GOAL #3   Title  Pt will report being able to walk for 2 hours without L knee pain in order to further demo improved strength and functional mobility and allow her to return to some of her leisure activities with greater ease.     Baseline  12/31: has been averaging about 1 hour, with pain about 4/10  Time  4    Period  Weeks    Status  On-going      PT LONG TERM GOAL #4   Title  Pt will report being able to stand for 3 hours without L knee pain or LBP in order to further demo improved functional and core strength in order to further maximize her work duties.    Baseline  12/31: 1 hour, 4/10 LBP and L knee pain    Time  4    Period  Weeks    Status  On-going            Plan - 05/24/18 1740    Clinical Impression Statement  Pt arrived with reports of increased sharp pain Lt knee, limited weight bearing  activities.  Hip strengthening activities completed on mat as well as seated spinal mobility exercises.  Was able to progress to dynamic surface with palvo activity wiht no reports of pain and minimal LOB episodes, able to regain independently.  Discussion held with benefits of heel lift to improve gait mechanics and pain control, pt verbalized she has plans to purchase one wiht no questions concerning.  Also discussed RICE technqiues to address Lt knee pain and edema control.  Reports pain reduced at EOS.      Rehab Potential  Fair    PT Frequency  2x / week    PT Duration  4 weeks    PT Treatment/Interventions  ADLs/Self Care Home Management;Aquatic Therapy;Cryotherapy;Electrical Stimulation;Moist Heat;Traction;Ultrasound;DME Instruction;Gait training;Stair training;Functional mobility training;Therapeutic activities;Therapeutic exercise;Balance training;Neuromuscular re-education;Patient/family education;Orthotic Fit/Training;Manual techniques;Scar mobilization;Passive range of motion;Dry needling;Energy conservation;Taping;Spinal Manipulations;Joint Manipulations    PT Next Visit Plan  Encourage patient to purchase and wear 1/8" to 1/4" heel lift for Lt foot. Continue lumbar/thoracic mobility/stretching and functional hip and core strengthening. Add SLS and balance challenges.     PT Home Exercise Plan  eval: supine HS stretch (hands behind thigh), supine piriformis stretch, supine figure 4; 12/31: prone quad stretch        Patient will benefit from skilled therapeutic intervention in order to improve the following deficits and impairments:  Abnormal gait, Decreased activity tolerance, Decreased balance, Decreased range of motion, Decreased strength, Difficulty walking, Hypomobility, Increased fascial restricitons, Increased muscle spasms, Impaired flexibility, Improper body mechanics, Postural dysfunction, Pain  Visit Diagnosis: Chronic bilateral low back pain without sciatica  Difficulty in  walking, not elsewhere classified  Other symptoms and signs involving the musculoskeletal system  Acute pain of left knee     Problem List Patient Active Problem List   Diagnosis Date Noted  . Restless leg syndrome 08/30/2017  . Seasonal allergies 02/22/2017  . Breast mass, left 09/10/2016  . Primary osteoarthritis of right knee   . Medial meniscus, posterior horn derangement   . Dysesthesia 10/15/2014  . Gait disturbance 10/15/2014  . Essential hypertension, benign 05/27/2013  . Multiple sclerosis ([place]) 03/06/2013  . Hypertriglyceridemia 11/02/2012  . Hypokalemia 11/02/2012  . Depression with anxiety 11/02/2012  . DS (disseminated sclerosis) ([place]) 03/22/2011  . DIVERTICULITIS, COLON 08/11/2009  . IRRITABLE BOWEL SYNDROME 08/11/2009   Ihor Austin, LPTA; CBIS [phone]  Aldona Lento 05/24/2018, 6:18 PM  [facility] [address] [place], [place], [address] Phone: [phone]   Fax:  [phone]  Name: ANGLE DIRUSSO MRN: [id number] Date of Birth: Jun 26, 1955

## 2018-05-28 ENCOUNTER — Other Ambulatory Visit: Payer: Self-pay | Admitting: Family Medicine

## 2018-05-28 NOTE — Telephone Encounter (Signed)
Ok times one needs o v

## 2018-05-30 ENCOUNTER — Encounter (HOSPITAL_COMMUNITY): Payer: Self-pay

## 2018-05-30 ENCOUNTER — Ambulatory Visit (HOSPITAL_COMMUNITY): Payer: Commercial Managed Care - PPO

## 2018-05-30 DIAGNOSIS — M545 Low back pain, unspecified: Secondary | ICD-10-CM

## 2018-05-30 DIAGNOSIS — M25562 Pain in left knee: Secondary | ICD-10-CM

## 2018-05-30 DIAGNOSIS — R29898 Other symptoms and signs involving the musculoskeletal system: Secondary | ICD-10-CM

## 2018-05-30 DIAGNOSIS — G8929 Other chronic pain: Secondary | ICD-10-CM

## 2018-05-30 DIAGNOSIS — R262 Difficulty in walking, not elsewhere classified: Secondary | ICD-10-CM

## 2018-05-30 NOTE — Therapy (Addendum)
[place], [place], [address] Phone: [phone]   Fax:  [phone]  Physical Therapy Treatment  Patient Details  Name: Krystal Silva MRN: [id number] Date of Birth: 1955-11-23 Referring Provider (PT): Arther Abbott, MD   Encounter Date: 05/30/2018  PT End of Session - 05/30/18 0833    Visit Number  7    Number of Visits  17    Date for PT Re-Evaluation  06/19/18    Authorization Type  [employer] PPO    Authorization Time Period  04/27/18 to 05/25/17; NEW: 05/22/18 to 06/19/18    Authorization - Visit Number  7    Authorization - Number of Visits  30   11/20/17 to 11/20/18 benefit plan year   PT Start Time  0817    PT Stop Time  0856    PT Time Calculation (min)  39 min    Activity Tolerance  Patient tolerated treatment well    Behavior During Therapy  [facility] for tasks assessed/performed       Past Medical History:  Diagnosis Date  . Allergy   . Diverticulitis   . Hypercholesteremia   . Hypertension   . Insomnia   . MS (multiple sclerosis) ([place])   . Vitamin D deficiency     Past Surgical History:  Procedure Laterality Date  . COLONOSCOPY  08/21/2009  . KNEE ARTHROSCOPY WITH MEDIAL MENISECTOMY Right 03/27/2015   Procedure: KNEE ARTHROSCOPY WITH PARTIAL MEDIAL MENISECTOMY AND MICROFRACTURE;  Surgeon: Carole Civil, MD;  Location: [facility];  Service: Orthopedics;  Laterality: Right;  . TONSILLECTOMY    . WISDOM TOOTH EXTRACTION  1980's    There were no vitals filed for this visit.  Subjective Assessment - 05/30/18 0819    Subjective  Pt stated her Lt knee continues to bother her on the medial aspect of knee, sharp pain wiht movements.    Patient Stated Goals  continue to walk and not have tons of pain    Currently in Pain?  Yes    Pain Score  5     Pain Location  Knee    Pain Orientation  Left;Medial    Pain Descriptors / Indicators  Sharp    Pain Type  Chronic pain    Pain Onset  More than a month ago     Pain Frequency  Intermittent    Aggravating Factors   walking, movement    Pain Relieving Factors  medicine, transitional movement         [facility] PT Assessment - 05/30/18 0001      Assessment   Medical Diagnosis  DDD, lumbar    Referring Provider (PT)  Arther Abbott, MD    Onset Date/Surgical Date  --   couple months ago   Next MD Visit  06/14/18    Prior Therapy  yes after R knee menisectomy                   [facility] Adult PT Treatment/Exercise - 05/30/18 0001      Lumbar Exercises: Stretches   Active Hamstring Stretch  Right;Left;3 reps;30 seconds    Active Hamstring Stretch Limitations  supine with rope    Quad Stretch  Right;Left;2 reps;30 seconds    Quad Stretch Limitations  prone with rope    Other Lumbar Stretch Exercise  fwd, R/L seated lumbar flexion stretch 5x10" each directon      Lumbar Exercises: Standing   Other Standing Lumbar Exercises  sidestep  wiht RTB, reports pain going Lt so ended early    Other Standing Lumbar Exercises  tandem stance on foam Palvo 4 sets x 10 reps; vector stance BLE 5x 5" holds      Manual Therapy   Manual Therapy  Soft tissue mobilization    Manual therapy comments  separate rest of treatment    Soft tissue mobilization  STM L medial and distal quad to reduce pain and restrictions               PT Short Term Goals - 05/22/18 9390      PT SHORT TERM GOAL #1   Title  Pt will have improved MMT by 1/2 grade throughout in order to reduce pain and maximize gait.    Time  2    Period  Weeks    Status  Partially Met      PT SHORT TERM GOAL #2   Title  Pt will report being able to walk for 1 hour with 3/10 L knee pain in order to maximize her work duties.    Baseline  12/31: has been averaging about 1 hour, with pain about 4/10    Time  2    Period  Weeks    Status  Partially Met      PT SHORT TERM GOAL #3   Title  Pt will report being able to stand for 2 hours with 3/10 L knee pain and LBP in order to  maximize her work duties and demo improved functional strength.    Baseline  12/31: 1 hour, 4/10 LBP and L knee pain    Time  2    Period  Weeks    Status  On-going      PT SHORT TERM GOAL #4   Title  Pt will be able to perform R SLS for 30 sec or > to be symmetrical with her LLE in order to maximize gait and stairs.     Baseline  12/31: 10.6 RLE, 34 LLE    Time  2    Period  Weeks    Status  On-going        PT Long Term Goals - 05/22/18 3009      PT LONG TERM GOAL #1   Title  Pt will have improved MMT by 1 grade throughout in order to further reduce LBP and L knee pain in order to maximize stairs and return to PLOF.     Time  4    Period  Weeks    Status  On-going      PT LONG TERM GOAL #2   Title  Pt will have 159f improvement in 3MWT with gait WFL and no L knee pain to demo improved tolerance to ambulation.     Baseline  12/31: 7137f continued gait deviations and L knee pain    Time  4    Period  Weeks    Status  On-going      PT LONG TERM GOAL #3   Title  Pt will report being able to walk for 2 hours without L knee pain in order to further demo improved strength and functional mobility and allow her to return to some of her leisure activities with greater ease.     Baseline  12/31: has been averaging about 1 hour, with pain about 4/10    Time  4    Period  Weeks    Status  On-going      PT  LONG TERM GOAL #4   Title  Pt will report being able to stand for 3 hours without L knee pain or LBP in order to further demo improved functional and core strength in order to further maximize her work duties.    Baseline  12/31: 1 hour, 4/10 LBP and L knee pain    Time  4    Period  Weeks    Status  On-going            Plan - 05/30/18 7681    Clinical Impression Statement  Pt continues to be limited wiht sharp pain Lt knee.  Continued session focus with hip strengthening and balance training.  Added vector stance for hip stability and SLS with intermittent HHA required  for LOB.  EOS with manual technqiues to address soft tissue restricions in quadriceps.  Reports pain reduced to 3/10 at EOS.      Rehab Potential  Fair    PT Frequency  2x / week    PT Duration  4 weeks    PT Treatment/Interventions  ADLs/Self Care Home Management;Aquatic Therapy;Cryotherapy;Electrical Stimulation;Moist Heat;Traction;Ultrasound;DME Instruction;Gait training;Stair training;Functional mobility training;Therapeutic activities;Therapeutic exercise;Balance training;Neuromuscular re-education;Patient/family education;Orthotic Fit/Training;Manual techniques;Scar mobilization;Passive range of motion;Dry needling;Energy conservation;Taping;Spinal Manipulations;Joint Manipulations    PT Next Visit Plan  Encourage patient to purchase and wear 1/8" to 1/4" heel lift for Lt foot. Continue lumbar/thoracic mobility/stretching and functional hip and core strengthening. Add SLS and balance challenges.     PT Home Exercise Plan  eval: supine HS stretch (hands behind thigh), supine piriformis stretch, supine figure 4; 12/31: prone quad stretch        Patient will benefit from skilled therapeutic intervention in order to improve the following deficits and impairments:  Abnormal gait, Decreased activity tolerance, Decreased balance, Decreased range of motion, Decreased strength, Difficulty walking, Hypomobility, Increased fascial restricitons, Increased muscle spasms, Impaired flexibility, Improper body mechanics, Postural dysfunction, Pain  Visit Diagnosis: Chronic bilateral low back pain without sciatica  Difficulty in walking, not elsewhere classified  Other symptoms and signs involving the musculoskeletal system  Acute pain of left knee     Problem List Patient Active Problem List   Diagnosis Date Noted  . Restless leg syndrome 08/30/2017  . Seasonal allergies 02/22/2017  . Breast mass, left 09/10/2016  . Primary osteoarthritis of right knee   . Medial meniscus, posterior horn  derangement   . Dysesthesia 10/15/2014  . Gait disturbance 10/15/2014  . Essential hypertension, benign 05/27/2013  . Multiple sclerosis ([place]) 03/06/2013  . Hypertriglyceridemia 11/02/2012  . Hypokalemia 11/02/2012  . Depression with anxiety 11/02/2012  . DS (disseminated sclerosis) ([place]) 03/22/2011  . DIVERTICULITIS, COLON 08/11/2009  . IRRITABLE BOWEL SYNDROME 08/11/2009   Ihor Austin, LPTA; [place]  Aldona Lento 05/30/2018, 9:24 AM  [facility] [address] [place], [place], [address] Phone: [phone]   Fax:  [phone]  Name: Krystal Silva MRN: [id number] Date of Birth: Nov 03, 1955

## 2018-05-31 ENCOUNTER — Encounter (HOSPITAL_COMMUNITY): Payer: Self-pay

## 2018-05-31 ENCOUNTER — Ambulatory Visit (HOSPITAL_COMMUNITY): Payer: Commercial Managed Care - PPO

## 2018-05-31 DIAGNOSIS — M25562 Pain in left knee: Secondary | ICD-10-CM

## 2018-05-31 DIAGNOSIS — M545 Low back pain: Principal | ICD-10-CM

## 2018-05-31 DIAGNOSIS — R29898 Other symptoms and signs involving the musculoskeletal system: Secondary | ICD-10-CM

## 2018-05-31 DIAGNOSIS — R262 Difficulty in walking, not elsewhere classified: Secondary | ICD-10-CM

## 2018-05-31 DIAGNOSIS — G8929 Other chronic pain: Secondary | ICD-10-CM

## 2018-05-31 NOTE — Therapy (Signed)
[place], [place], [address] Phone: [phone]   Fax:  [phone]  Physical Therapy Treatment  Patient Details  Name: Krystal Silva MRN: [id number] Date of Birth: 06/05/1955 Referring Provider (PT): Arther Abbott, MD   Encounter Date: 05/31/2018  PT End of Session - 05/31/18 0845    Visit Number  8    Number of Visits  17    Date for PT Re-Evaluation  06/19/18    Authorization Type  [employer] PPO    Authorization Time Period  04/27/18 to 05/25/17; NEW: 05/22/18 to 06/19/18    Authorization - Visit Number  8    Authorization - Number of Visits  30   11/20/17 to 11/20/18 benefit plan year   PT Start Time  0820    PT Stop Time  0858    PT Time Calculation (min)  38 min    Activity Tolerance  Patient tolerated treatment well;Patient limited by fatigue    Behavior During Therapy  [facility] for tasks assessed/performed       Past Medical History:  Diagnosis Date  . Allergy   . Diverticulitis   . Hypercholesteremia   . Hypertension   . Insomnia   . MS (multiple sclerosis) ([place])   . Vitamin D deficiency     Past Surgical History:  Procedure Laterality Date  . COLONOSCOPY  08/21/2009  . KNEE ARTHROSCOPY WITH MEDIAL MENISECTOMY Right 03/27/2015   Procedure: KNEE ARTHROSCOPY WITH PARTIAL MEDIAL MENISECTOMY AND MICROFRACTURE;  Surgeon: Carole Civil, MD;  Location: [facility];  Service: Orthopedics;  Laterality: Right;  . TONSILLECTOMY    . WISDOM TOOTH EXTRACTION  1980's    There were no vitals filed for this visit.  Subjective Assessment - 05/31/18 1443    Subjective  Pt stated she had increased pain last session but feels good today, pain scale low probably 2/10 achey dull     Patient Stated Goals  continue to walk and not have tons of pain    Currently in Pain?  Yes    Pain Score  2     Pain Location  Knee    Pain Orientation  Left;Medial    Pain Descriptors / Indicators  Aching;Dull;Sharp    Pain Type   Chronic pain    Pain Onset  More than a month ago    Pain Frequency  Intermittent    Aggravating Factors   walking, movement    Pain Relieving Factors  medicine, transitional movement    Effect of Pain on Daily Activities  limits                       [facility] Adult PT Treatment/Exercise - 05/31/18 0001      Exercises   Exercises  Knee/Hip      Knee/Hip Exercises: Stretches   Active Hamstring Stretch  3 reps;30 seconds    Active Hamstring Stretch Limitations  supine wiht rope    Quad Stretch  3 reps;30 seconds    Quad Stretch Limitations  prone with rope      Knee/Hip Exercises: Standing   Lateral Step Up  Left;15 reps;Hand Hold: 1;Step Height: 4"    Forward Step Up  Left;15 reps;Hand Hold: 1;Step Height: 4"    SLS with Vectors  BLE 5x 5" with intermittent HHA    Other Standing Knee Exercises  sidestep 2RT GTB    Other Standing Knee Exercises  palvo tandom stance 10x each  Manual Therapy   Manual Therapy  Soft tissue mobilization    Manual therapy comments  separate rest of treatment    Soft tissue mobilization  STM L medial and distal quad to reduce pain and restrictions               PT Short Term Goals - 05/22/18 2025      PT SHORT TERM GOAL #1   Title  Pt will have improved MMT by 1/2 grade throughout in order to reduce pain and maximize gait.    Time  2    Period  Weeks    Status  Partially Met      PT SHORT TERM GOAL #2   Title  Pt will report being able to walk for 1 hour with 3/10 L knee pain in order to maximize her work duties.    Baseline  12/31: has been averaging about 1 hour, with pain about 4/10    Time  2    Period  Weeks    Status  Partially Met      PT SHORT TERM GOAL #3   Title  Pt will report being able to stand for 2 hours with 3/10 L knee pain and LBP in order to maximize her work duties and demo improved functional strength.    Baseline  12/31: 1 hour, 4/10 LBP and L knee pain    Time  2    Period  Weeks    Status   On-going      PT SHORT TERM GOAL #4   Title  Pt will be able to perform R SLS for 30 sec or > to be symmetrical with her LLE in order to maximize gait and stairs.     Baseline  12/31: 10.6 RLE, 34 LLE    Time  2    Period  Weeks    Status  On-going        PT Long Term Goals - 05/22/18 4270      PT LONG TERM GOAL #1   Title  Pt will have improved MMT by 1 grade throughout in order to further reduce LBP and L knee pain in order to maximize stairs and return to PLOF.     Time  4    Period  Weeks    Status  On-going      PT LONG TERM GOAL #2   Title  Pt will have 152f improvement in 3MWT with gait WFL and no L knee pain to demo improved tolerance to ambulation.     Baseline  12/31: 7124f continued gait deviations and L knee pain    Time  4    Period  Weeks    Status  On-going      PT LONG TERM GOAL #3   Title  Pt will report being able to walk for 2 hours without L knee pain in order to further demo improved strength and functional mobility and allow her to return to some of her leisure activities with greater ease.     Baseline  12/31: has been averaging about 1 hour, with pain about 4/10    Time  4    Period  Weeks    Status  On-going      PT LONG TERM GOAL #4   Title  Pt will report being able to stand for 3 hours without L knee pain or LBP in order to further demo improved functional and core strength in order to further  maximize her work duties.    Baseline  12/31: 1 hour, 4/10 LBP and L knee pain    Time  4    Period  Weeks    Status  On-going            Plan - 05/31/18 0847    Clinical Impression Statement  Session focus on hip stability, balance training and additional functional strengthening for quad strengthening.  Added lateral and forward step ups for functional strengthening.  Pt able to complete with proper form with c/o fatigue with task as well as increased pain on medial aspect of knee.  Pt did present with improved stability with palvo exercise this  session.  EOS with manual to address soft tissue mobilization in quadriceps wiht reports of pain minimal following.      Rehab Potential  Fair    PT Frequency  2x / week    PT Duration  4 weeks    PT Treatment/Interventions  ADLs/Self Care Home Management;Aquatic Therapy;Cryotherapy;Electrical Stimulation;Moist Heat;Traction;Ultrasound;DME Instruction;Gait training;Stair training;Functional mobility training;Therapeutic activities;Therapeutic exercise;Balance training;Neuromuscular re-education;Patient/family education;Orthotic Fit/Training;Manual techniques;Scar mobilization;Passive range of motion;Dry needling;Energy conservation;Taping;Spinal Manipulations;Joint Manipulations    PT Next Visit Plan  Encourage patient to purchase and wear 1/8" to 1/4" heel lift for Lt foot. Continue lumbar/thoracic mobility/stretching and functional hip and core strengthening. Add SLS and balance challenges.     PT Home Exercise Plan  eval: supine HS stretch (hands behind thigh), supine piriformis stretch, supine figure 4; 12/31: prone quad stretch        Patient will benefit from skilled therapeutic intervention in order to improve the following deficits and impairments:  Abnormal gait, Decreased activity tolerance, Decreased balance, Decreased range of motion, Decreased strength, Difficulty walking, Hypomobility, Increased fascial restricitons, Increased muscle spasms, Impaired flexibility, Improper body mechanics, Postural dysfunction, Pain  Visit Diagnosis: Chronic bilateral low back pain without sciatica  Difficulty in walking, not elsewhere classified  Other symptoms and signs involving the musculoskeletal system  Acute pain of left knee     Problem List Patient Active Problem List   Diagnosis Date Noted  . Restless leg syndrome 08/30/2017  . Seasonal allergies 02/22/2017  . Breast mass, left 09/10/2016  . Primary osteoarthritis of right knee   . Medial meniscus, posterior horn derangement   .  Dysesthesia 10/15/2014  . Gait disturbance 10/15/2014  . Essential hypertension, benign 05/27/2013  . Multiple sclerosis ([place]) 03/06/2013  . Hypertriglyceridemia 11/02/2012  . Hypokalemia 11/02/2012  . Depression with anxiety 11/02/2012  . DS (disseminated sclerosis) ([place]) 03/22/2011  . DIVERTICULITIS, COLON 08/11/2009  . IRRITABLE BOWEL SYNDROME 08/11/2009   Ihor Austin, LPTA; [place]  Aldona Lento 05/31/2018, 9:08 AM  [place] [address] [place], [place], [address] Phone: [phone]   Fax:  [phone]  Name: Krystal Silva MRN: [id number] Date of Birth: [birth year]

## 2018-06-05 ENCOUNTER — Encounter (HOSPITAL_COMMUNITY): Payer: Commercial Managed Care - PPO

## 2018-06-06 ENCOUNTER — Other Ambulatory Visit: Payer: Self-pay

## 2018-06-06 ENCOUNTER — Encounter (HOSPITAL_COMMUNITY): Payer: Self-pay

## 2018-06-06 ENCOUNTER — Ambulatory Visit (HOSPITAL_COMMUNITY): Payer: Commercial Managed Care - PPO

## 2018-06-06 ENCOUNTER — Telehealth (HOSPITAL_COMMUNITY): Payer: Self-pay | Admitting: Family Medicine

## 2018-06-06 DIAGNOSIS — G8929 Other chronic pain: Secondary | ICD-10-CM

## 2018-06-06 DIAGNOSIS — R262 Difficulty in walking, not elsewhere classified: Secondary | ICD-10-CM

## 2018-06-06 DIAGNOSIS — M545 Low back pain, unspecified: Secondary | ICD-10-CM

## 2018-06-06 DIAGNOSIS — R29898 Other symptoms and signs involving the musculoskeletal system: Secondary | ICD-10-CM

## 2018-06-06 DIAGNOSIS — M25562 Pain in left knee: Secondary | ICD-10-CM

## 2018-06-06 NOTE — Telephone Encounter (Signed)
06/06/18 pt wanted to change the time for the 1/22 appt... she has an 8:30 appt with Dr. Aline Brochure and a 9 here with [place].... she didn't want back to back days and I offered a a 1:00 on Tuesday that week but she couldn't do that.  I told her that I would add her to the wait list.

## 2018-06-06 NOTE — Therapy (Signed)
[place], [place], [address] Phone: [phone]   Fax:  [phone]  Physical Therapy Treatment  Patient Details  Name: Krystal Silva MRN: [id number] Date of Birth: 12/10/1955 Referring Provider (PT): Arther Abbott, MD   Encounter Date: 06/06/2018  PT End of Session - 06/06/18 0822    Visit Number  9    Number of Visits  17    Date for PT Re-Evaluation  06/19/18    Authorization Type  [employer] PPO    Authorization Time Period  04/27/18 to 05/25/17; NEW: 05/22/18 to 06/19/18    Authorization - Visit Number  9    Authorization - Number of Visits  30   11/20/17 to 11/20/18 benefit plan year   PT Start Time  0817    PT Stop Time  0900    PT Time Calculation (min)  43 min    Activity Tolerance  Patient tolerated treatment well    Behavior During Therapy  [facility] for tasks assessed/performed       Past Medical History:  Diagnosis Date  . Allergy   . Diverticulitis   . Hypercholesteremia   . Hypertension   . Insomnia   . MS (multiple sclerosis) ([place])   . Vitamin D deficiency     Past Surgical History:  Procedure Laterality Date  . COLONOSCOPY  08/21/2009  . KNEE ARTHROSCOPY WITH MEDIAL MENISECTOMY Right 03/27/2015   Procedure: KNEE ARTHROSCOPY WITH PARTIAL MEDIAL MENISECTOMY AND MICROFRACTURE;  Surgeon: Carole Civil, MD;  Location: [facility];  Service: Orthopedics;  Laterality: Right;  . TONSILLECTOMY    . WISDOM TOOTH EXTRACTION  1980's    There were no vitals filed for this visit.  Subjective Assessment - 06/06/18 0818    Subjective  Patient reports her Lt knee hurts right now and it comes and goes throughout the day but is worse when walking. She reports about 5/10 right now. She denies back pain currently.    Limitations  Walking;Standing    Patient Stated Goals  continue to walk and not have tons of pain    Currently in Pain?  Yes    Pain Score  5     Pain Orientation  Left    Pain Descriptors /  Indicators  Aching    Pain Type  Chronic pain    Pain Onset  More than a month ago    Pain Frequency  Intermittent    Aggravating Factors   walking and standing, sometimes prolonged sitting    Pain Relieving Factors  rest, medicine    Effect of Pain on Daily Activities  limits time walking or standing at work    Multiple Pain Sites  No       [facility] Adult PT Treatment/Exercise - 06/06/18 0001      Exercises   Exercises  Knee/Hip      Lumbar Exercises: Stretches   Other Lumbar Stretch Exercise  Fwd, Rt, Lt seated lumbar flexion stretch with swiss ball, 10x 5" each directon      Knee/Hip Exercises: Stretches   Sports administrator  3 reps;30 seconds    Quad Stretch Limitations  prone with rope      Knee/Hip Exercises: Standing   Lateral Step Up  Left;15 reps;Hand Hold: 1;Step Height: 4"    Forward Step Up  Left;15 reps;Hand Hold: 1;Step Height: 4"    Functional Squat  2 sets;10 reps    Other Standing Knee Exercises  Sidestep with green TB,  3x 15' RT, band at knees      Knee/Hip Exercises: Seated   Long Arc Quad  Strengthening;Left;1 set;15 reps;Weights    Long Arc Quad Weight  5 lbs.    Long [employer] Limitations  attemtped with Red TB forpregressive resistance however unable to maintain position of band on ankle      Knee/Hip Exercises: Supine   Straight Leg Raises  Strengthening;Left;2 sets;15 reps    Straight Leg Raises Limitations  tactile cue to facilitate quad contraction      Manual Therapy   Manual Therapy  Joint mobilization    Manual therapy comments  separate rest of treatment    Joint Mobilization  Lt Patellofemoral joint mobilization, 3x 30-45 seconds sup/inf, med/lat, grade III       PT Education - 06/06/18 8828    Education Details  Educated on exercsies throughout and purpose of interventions.     Person(s) Educated  Patient    Methods  Explanation    Comprehension  Verbalized understanding       PT Short Term Goals - 05/22/18 0823      PT SHORT TERM GOAL #1    Title  Pt will have improved MMT by 1/2 grade throughout in order to reduce pain and maximize gait.    Time  2    Period  Weeks    Status  Partially Met      PT SHORT TERM GOAL #2   Title  Pt will report being able to walk for 1 hour with 3/10 L knee pain in order to maximize her work duties.    Baseline  12/31: has been averaging about 1 hour, with pain about 4/10    Time  2    Period  Weeks    Status  Partially Met      PT SHORT TERM GOAL #3   Title  Pt will report being able to stand for 2 hours with 3/10 L knee pain and LBP in order to maximize her work duties and demo improved functional strength.    Baseline  12/31: 1 hour, 4/10 LBP and L knee pain    Time  2    Period  Weeks    Status  On-going      PT SHORT TERM GOAL #4   Title  Pt will be able to perform R SLS for 30 sec or > to be symmetrical with her LLE in order to maximize gait and stairs.     Baseline  12/31: 10.6 RLE, 34 LLE    Time  2    Period  Weeks    Status  On-going       PT Long Term Goals - 05/22/18 0034      PT LONG TERM GOAL #1   Title  Pt will have improved MMT by 1 grade throughout in order to further reduce LBP and L knee pain in order to maximize stairs and return to PLOF.     Time  4    Period  Weeks    Status  On-going      PT LONG TERM GOAL #2   Title  Pt will have 170f improvement in 3MWT with gait WFL and no L knee pain to demo improved tolerance to ambulation.     Baseline  12/31: 7135f continued gait deviations and L knee pain    Time  4    Period  Weeks    Status  On-going  PT LONG TERM GOAL #3   Title  Pt will report being able to walk for 2 hours without L knee pain in order to further demo improved strength and functional mobility and allow her to return to some of her leisure activities with greater ease.     Baseline  12/31: has been averaging about 1 hour, with pain about 4/10    Time  4    Period  Weeks    Status  On-going      PT LONG TERM GOAL #4   Title  Pt  will report being able to stand for 3 hours without L knee pain or LBP in order to further demo improved functional and core strength in order to further maximize her work duties.    Baseline  12/31: 1 hour, 4/10 LBP and L knee pain    Time  4    Period  Weeks    Status  On-going       Plan - 06/06/18 2094    Clinical Impression Statement  This session continued with current POC and focus on LE functional strengthening and exercises to improve patella tracking in Lt knee. Patient performed SLR requiring tactile cue for proper quad contraction and LAQ, attempted to apply progressive resistance but unable to secure strap at foot/ankle. Patellofemoral joint mobilization performed today and patient reported positive response to intervention and no pain. Functional squats were added today and patient has tendency to shift weight over Lt LE requiring multimodal cues to correct. She will continue to benefit from skilled PT interventions to address impairments and progress mobility as well as reduce pain.    Rehab Potential  Fair    PT Frequency  2x / week    PT Duration  4 weeks    PT Treatment/Interventions  ADLs/Self Care Home Management;Aquatic Therapy;Cryotherapy;Electrical Stimulation;Moist Heat;Traction;Ultrasound;DME Instruction;Gait training;Stair training;Functional mobility training;Therapeutic activities;Therapeutic exercise;Balance training;Neuromuscular re-education;Patient/family education;Orthotic Fit/Training;Manual techniques;Scar mobilization;Passive range of motion;Dry needling;Energy conservation;Taping;Spinal Manipulations;Joint Manipulations    PT Next Visit Plan  Encourage patient to purchase and wear 1/8" to 1/4" heel lift for Lt foot. Continue lumbar/thoracic mobility/stretching and functional hip and core strengthening. Focus on exercises for Lt knee with for patella tracking with quad strengthening.     PT Home Exercise Plan  eval: supine HS stretch (hands behind thigh), supine  piriformis stretch, supine figure 4; 12/31: prone quad stretch     Consulted and Agree with Plan of Care  Patient       Patient will benefit from skilled therapeutic intervention in order to improve the following deficits and impairments:  Abnormal gait, Decreased activity tolerance, Decreased balance, Decreased range of motion, Decreased strength, Difficulty walking, Hypomobility, Increased fascial restricitons, Increased muscle spasms, Impaired flexibility, Improper body mechanics, Postural dysfunction, Pain  Visit Diagnosis: Chronic bilateral low back pain without sciatica  Difficulty in walking, not elsewhere classified  Other symptoms and signs involving the musculoskeletal system  Acute pain of left knee     Problem List Patient Active Problem List   Diagnosis Date Noted  . Restless leg syndrome 08/30/2017  . Seasonal allergies 02/22/2017  . Breast mass, left 09/10/2016  . Primary osteoarthritis of right knee   . Medial meniscus, posterior horn derangement   . Dysesthesia 10/15/2014  . Gait disturbance 10/15/2014  . Essential hypertension, benign 05/27/2013  . Multiple sclerosis ([place]) 03/06/2013  . Hypertriglyceridemia 11/02/2012  . Hypokalemia 11/02/2012  . Depression with anxiety 11/02/2012  . DS (disseminated sclerosis) ([place]) 03/22/2011  . DIVERTICULITIS, COLON  08/11/2009  . IRRITABLE BOWEL SYNDROME 08/11/2009    Kipp Brood, PT, DPT, [facility] Physical Therapist with [facility]  06/06/2018 9:37 AM    [place] [address] [place], [place], [address] Phone: [phone]   Fax:  [phone]  Name: BEADIE MATSUNAGA MRN: [id number] Date of Birth: Mar 18, 1956

## 2018-06-08 ENCOUNTER — Ambulatory Visit (HOSPITAL_COMMUNITY): Payer: Commercial Managed Care - PPO

## 2018-06-08 ENCOUNTER — Other Ambulatory Visit (HOSPITAL_COMMUNITY): Payer: Self-pay | Admitting: Family Medicine

## 2018-06-08 ENCOUNTER — Encounter (HOSPITAL_COMMUNITY): Payer: Self-pay

## 2018-06-08 DIAGNOSIS — M545 Low back pain, unspecified: Secondary | ICD-10-CM

## 2018-06-08 DIAGNOSIS — R29898 Other symptoms and signs involving the musculoskeletal system: Secondary | ICD-10-CM

## 2018-06-08 DIAGNOSIS — R262 Difficulty in walking, not elsewhere classified: Secondary | ICD-10-CM

## 2018-06-08 DIAGNOSIS — G8929 Other chronic pain: Secondary | ICD-10-CM

## 2018-06-08 DIAGNOSIS — M25562 Pain in left knee: Secondary | ICD-10-CM

## 2018-06-08 NOTE — Telephone Encounter (Signed)
Med list does not have Losartan 100 mg tab. Left message to return call

## 2018-06-08 NOTE — Therapy (Signed)
[place], [place], [address] Phone: [phone]   Fax:  [phone]  Physical Therapy Treatment  Patient Details  Name: Krystal Silva MRN: [id number] Date of Birth: 05-23-1956 Referring Provider (PT): Arther Abbott, MD   Encounter Date: 06/08/2018  PT End of Session - 06/08/18 0819    Visit Number  10    Number of Visits  17    Date for PT Re-Evaluation  06/19/18    Authorization Type  [employer] PPO    Authorization Time Period  04/27/18 to 05/25/17; NEW: 05/22/18 to 06/19/18    Authorization - Visit Number  10    Authorization - Number of Visits  30   11/20/17 to 11/20/18 benefit plan year   PT Start Time  0818    PT Stop Time  0858    PT Time Calculation (min)  40 min    Activity Tolerance  Patient tolerated treatment well    Behavior During Therapy  [facility] for tasks assessed/performed       Past Medical History:  Diagnosis Date  . Allergy   . Diverticulitis   . Hypercholesteremia   . Hypertension   . Insomnia   . MS (multiple sclerosis) ([place])   . Vitamin D deficiency     Past Surgical History:  Procedure Laterality Date  . COLONOSCOPY  08/21/2009  . KNEE ARTHROSCOPY WITH MEDIAL MENISECTOMY Right 03/27/2015   Procedure: KNEE ARTHROSCOPY WITH PARTIAL MEDIAL MENISECTOMY AND MICROFRACTURE;  Surgeon: Carole Civil, MD;  Location: [facility];  Service: Orthopedics;  Laterality: Right;  . TONSILLECTOMY    . WISDOM TOOTH EXTRACTION  1980's    There were no vitals filed for this visit.  Subjective Assessment - 06/08/18 0820    Subjective  Pt states that she bought her heel lift but hasn't used it yet. Her back is not in pain. Both her knees are bothering her and have been since her last session.     Limitations  Walking;Standing    Patient Stated Goals  continue to walk and not have tons of pain    Currently in Pain?  Yes    Pain Score  6     Pain Location  Knee    Pain Orientation  Right;Left    Pain  Descriptors / Indicators  Aching    Pain Type  Chronic pain    Pain Onset  More than a month ago    Pain Frequency  Intermittent    Aggravating Factors   walking and standing, sometimes prolonged sitting    Pain Relieving Factors  rest, medicine    Effect of Pain on Daily Activities  limits time walking or standing at work              [facility] Adult PT Treatment/Exercise - 06/08/18 0001      Knee/Hip Exercises: Stretches   Knee: Self-Stretch to increase Flexion  Both;3 reps;20 seconds    Knee: Self-Stretch Limitations  standing, 12" step    Gastroc Stretch  Both;3 reps;30 seconds      Knee/Hip Exercises: Standing   Heel Raises  Both;20 reps    Heel Raises Limitations  heel and toe    Functional Squat  10 reps    Functional Squat Limitations  much reduced pain with tape    Wall Squat  2 sets;10 reps    Wall Squat Limitations  2nd set with band for VLO      Manual Therapy  Manual Therapy  Taping    Kinesiotex  Ligament Correction      Kinesiotix   Ligament Correction  bil patellar tracking lateral to help reduce pain with WB activities           PT Education - 06/08/18 0820    Education Details  exercise technique, continue HEP    Person(s) Educated  Patient    Methods  Explanation;Demonstration    Comprehension  Verbalized understanding;Returned demonstration       PT Short Term Goals - 05/22/18 0823      PT SHORT TERM GOAL #1   Title  Pt will have improved MMT by 1/2 grade throughout in order to reduce pain and maximize gait.    Time  2    Period  Weeks    Status  Partially Met      PT SHORT TERM GOAL #2   Title  Pt will report being able to walk for 1 hour with 3/10 L knee pain in order to maximize her work duties.    Baseline  12/31: has been averaging about 1 hour, with pain about 4/10    Time  2    Period  Weeks    Status  Partially Met      PT SHORT TERM GOAL #3   Title  Pt will report being able to stand for 2 hours with 3/10 L knee pain and LBP  in order to maximize her work duties and demo improved functional strength.    Baseline  12/31: 1 hour, 4/10 LBP and L knee pain    Time  2    Period  Weeks    Status  On-going      PT SHORT TERM GOAL #4   Title  Pt will be able to perform R SLS for 30 sec or > to be symmetrical with her LLE in order to maximize gait and stairs.     Baseline  12/31: 10.6 RLE, 34 LLE    Time  2    Period  Weeks    Status  On-going        PT Long Term Goals - 05/22/18 2979      PT LONG TERM GOAL #1   Title  Pt will have improved MMT by 1 grade throughout in order to further reduce LBP and L knee pain in order to maximize stairs and return to PLOF.     Time  4    Period  Weeks    Status  On-going      PT LONG TERM GOAL #2   Title  Pt will have 133f improvement in 3MWT with gait WFL and no L knee pain to demo improved tolerance to ambulation.     Baseline  12/31: 7154f continued gait deviations and L knee pain    Time  4    Period  Weeks    Status  On-going      PT LONG TERM GOAL #3   Title  Pt will report being able to walk for 2 hours without L knee pain in order to further demo improved strength and functional mobility and allow her to return to some of her leisure activities with greater ease.     Baseline  12/31: has been averaging about 1 hour, with pain about 4/10    Time  4    Period  Weeks    Status  On-going      PT LONG TERM GOAL #4  Title  Pt will report being able to stand for 3 hours without L knee pain or LBP in order to further demo improved functional and core strength in order to further maximize her work duties.    Baseline  12/31: 1 hour, 4/10 LBP and L knee pain    Time  4    Period  Weeks    Status  On-going            Plan - 06/08/18 0901    Clinical Impression Statement  Pt continues to present to therapy with increased knee pain and minimal LBP. Pt stating that she has been in increased pain from her last session. Continue feel that pt's knee pain are an  isolated issue separate from her LBP; feel the LBP is due to her LLD and constant L lateral flexion during gait/WB and compensating for bil knee pain. Did not progress pt's therex this date due to increased soreness last session. Pt reporting pain with step ups so PT laterally tracked her patella and she reported much improved symptoms during the step ups. Applied rock tape to pt's bil knee caps to facilitate more central tracking of the patella (pulled them laterally) and she stated her pain with WB/walking/stairs was significantly reduced. Educated pt on s/s of skin irritation and to remove tape if any of those appeared. Good bit of session spent on education and why her knee cap could be tracking medially and that the tape is meant to allow her to strengthen in pain-free ranges. Also educated her on benefits of aquatic therapy; will f/u on this. Pt sees MD Thursday so today's note forwarded to him. Continue as planned, progressing as able.     Rehab Potential  Fair    PT Frequency  2x / week    PT Duration  4 weeks    PT Treatment/Interventions  ADLs/Self Care Home Management;Aquatic Therapy;Cryotherapy;Electrical Stimulation;Moist Heat;Traction;Ultrasound;DME Instruction;Gait training;Stair training;Functional mobility training;Therapeutic activities;Therapeutic exercise;Balance training;Neuromuscular re-education;Patient/family education;Orthotic Fit/Training;Manual techniques;Scar mobilization;Passive range of motion;Dry needling;Energy conservation;Taping;Spinal Manipulations;Joint Manipulations    PT Next Visit Plan  f/u on potential aquatic therapy; reapply k-tape to allow her to strengthen in pain-free range; Encourage patient to wear 1/8" to 1/4" heel lift for Lt foot. Continue lumbar/thoracic mobility/stretching and functional hip and core strengthening. Focus on exercises for Lt knee with for patella tracking with quad strengthening.     PT Home Exercise Plan  eval: supine HS stretch (hands behind  thigh), supine piriformis stretch, supine figure 4; 12/31: prone quad stretch     Consulted and Agree with Plan of Care  Patient       Patient will benefit from skilled therapeutic intervention in order to improve the following deficits and impairments:  Abnormal gait, Decreased activity tolerance, Decreased balance, Decreased range of motion, Decreased strength, Difficulty walking, Hypomobility, Increased fascial restricitons, Increased muscle spasms, Impaired flexibility, Improper body mechanics, Postural dysfunction, Pain  Visit Diagnosis: Chronic bilateral low back pain without sciatica  Difficulty in walking, not elsewhere classified  Other symptoms and signs involving the musculoskeletal system  Acute pain of left knee     Problem List Patient Active Problem List   Diagnosis Date Noted  . Restless leg syndrome 08/30/2017  . Seasonal allergies 02/22/2017  . Breast mass, left 09/10/2016  . Primary osteoarthritis of right knee   . Medial meniscus, posterior horn derangement   . Dysesthesia 10/15/2014  . Gait disturbance 10/15/2014  . Essential hypertension, benign 05/27/2013  . Multiple sclerosis ([place]) 03/06/2013  .  Hypertriglyceridemia 11/02/2012  . Hypokalemia 11/02/2012  . Depression with anxiety 11/02/2012  . DS (disseminated sclerosis) ([place]) 03/22/2011  . DIVERTICULITIS, COLON 08/11/2009  . IRRITABLE BOWEL SYNDROME 08/11/2009        Geraldine Solar PT, DPT  [facility] [address] [place], [place], [address] Phone: [phone]   Fax:  [phone]  Name: Krystal Silva MRN: [id number] Date of Birth: March 07, 1956

## 2018-06-11 ENCOUNTER — Telehealth: Payer: Self-pay | Admitting: Family Medicine

## 2018-06-11 ENCOUNTER — Ambulatory Visit (HOSPITAL_COMMUNITY): Payer: Commercial Managed Care - PPO | Admitting: Physical Therapy

## 2018-06-11 ENCOUNTER — Ambulatory Visit: Payer: Commercial Managed Care - PPO | Admitting: Orthopedic Surgery

## 2018-06-11 ENCOUNTER — Other Ambulatory Visit: Payer: Self-pay | Admitting: *Deleted

## 2018-06-11 ENCOUNTER — Telehealth (HOSPITAL_COMMUNITY): Payer: Self-pay | Admitting: Family Medicine

## 2018-06-11 DIAGNOSIS — M545 Low back pain, unspecified: Secondary | ICD-10-CM

## 2018-06-11 DIAGNOSIS — G8929 Other chronic pain: Secondary | ICD-10-CM

## 2018-06-11 DIAGNOSIS — R262 Difficulty in walking, not elsewhere classified: Secondary | ICD-10-CM

## 2018-06-11 MED ORDER — SERTRALINE HCL 100 MG PO TABS: 100.0000 mg | ORAL_TABLET | Freq: Every day | ORAL | 0 refills | Status: DC

## 2018-06-11 NOTE — Telephone Encounter (Signed)
Pt returned call. Pt states she is taking Losartan 100 mg and a separate diuretic due to Losartan with diuretic not being made any longer.

## 2018-06-11 NOTE — Therapy (Signed)
[place], [place], [address] Phone: [phone]   Fax:  [phone]  Physical Therapy Treatment  Patient Details  Name: Krystal Silva MRN: [id number] Date of Birth: [birth year] Referring Provider (PT): Arther Abbott, MD   Encounter Date: 06/11/2018  PT End of Session - 06/11/18 1153    Visit Number  11    Number of Visits  17    Date for PT Re-Evaluation  06/19/18    Authorization Type  [employer] PPO    Authorization Time Period  04/27/18 to 05/25/17; NEW: 05/22/18 to 06/19/18    Authorization - Visit Number  11    Authorization - Number of Visits  30   11/20/17 to 11/20/18 benefit plan year   PT Start Time  1122    PT Stop Time  1205    PT Time Calculation (min)  43 min    Activity Tolerance  Patient tolerated treatment well    Behavior During Therapy  [facility] for tasks assessed/performed       Past Medical History:  Diagnosis Date  . Allergy   . Diverticulitis   . Hypercholesteremia   . Hypertension   . Insomnia   . MS (multiple sclerosis) ([place])   . Vitamin D deficiency     Past Surgical History:  Procedure Laterality Date  . COLONOSCOPY  08/21/2009  . KNEE ARTHROSCOPY WITH MEDIAL MENISECTOMY Right 03/27/2015   Procedure: KNEE ARTHROSCOPY WITH PARTIAL MEDIAL MENISECTOMY AND MICROFRACTURE;  Surgeon: Carole Civil, MD;  Location: [facility];  Service: Orthopedics;  Laterality: Right;  . TONSILLECTOMY    . WISDOM TOOTH EXTRACTION  1980's    There were no vitals filed for this visit.  Subjective Assessment - 06/11/18 1127    Subjective  Pt states that she did not do any exercises over the weekend.  Her knee is hurting her more than her back     Limitations  Walking;Standing    Patient Stated Goals  continue to walk and not have tons of pain    Currently in Pain?  Yes    Pain Score  1     Pain Location  Back    Pain Orientation  Lower    Pain Descriptors / Indicators  Aching    Pain Type  Chronic pain     Pain Onset  More than a month ago    Aggravating Factors   activity, housework     Pain Score  6    Pain Location  Knee    Pain Orientation  Left    Pain Descriptors / Indicators  Aching    Pain Type  Chronic pain    Pain Onset  More than a month ago    Pain Frequency  Intermittent    Aggravating Factors   weight bearing     Pain Relieving Factors  ice    Effect of Pain on Daily Activities  limits                        [facility] Adult PT Treatment/Exercise - 06/11/18 0001      Exercises   Exercises  Lumbar      Lumbar Exercises: Stretches   Active Hamstring Stretch  Right;3 reps;30 seconds    Other Lumbar Stretch Exercise  Fwd, Rt, Lt seated lumbar flexion stretch with swiss ball, 10x 5" each directon      Lumbar Exercises: Standing   Heel Raises  10  reps    Functional Squats  10 reps    Wall Slides  10 reps   x2   Scapular Retraction  Strengthening;Both;10 reps;Theraband    Row  Strengthening;Both;10 reps;Theraband    Shoulder Extension  Both;10 reps;Theraband    Other Standing Lumbar Exercises  hip excursion x 3     Other Standing Lumbar Exercises  tandem stance on foam Palvo 4 sets x 10 reps; vector stance BLE 5x 10" holds      Lumbar Exercises: Quadruped   Opposite Arm/Leg Raise  Right arm/Left leg;Left arm/Right leg;10 reps      Knee/Hip Exercises: Standing   Wall Squat  --               PT Short Term Goals - 05/22/18 1610      PT SHORT TERM GOAL #1   Title  Pt will have improved MMT by 1/2 grade throughout in order to reduce pain and maximize gait.    Time  2    Period  Weeks    Status  Partially Met      PT SHORT TERM GOAL #2   Title  Pt will report being able to walk for 1 hour with 3/10 L knee pain in order to maximize her work duties.    Baseline  12/31: has been averaging about 1 hour, with pain about 4/10    Time  2    Period  Weeks    Status  Partially Met      PT SHORT TERM GOAL #3   Title  Pt will report being able to  stand for 2 hours with 3/10 L knee pain and LBP in order to maximize her work duties and demo improved functional strength.    Baseline  12/31: 1 hour, 4/10 LBP and L knee pain    Time  2    Period  Weeks    Status  On-going      PT SHORT TERM GOAL #4   Title  Pt will be able to perform R SLS for 30 sec or > to be symmetrical with her LLE in order to maximize gait and stairs.     Baseline  12/31: 10.6 RLE, 34 LLE    Time  2    Period  Weeks    Status  On-going        PT Long Term Goals - 05/22/18 9604      PT LONG TERM GOAL #1   Title  Pt will have improved MMT by 1 grade throughout in order to further reduce LBP and L knee pain in order to maximize stairs and return to PLOF.     Time  4    Period  Weeks    Status  On-going      PT LONG TERM GOAL #2   Title  Pt will have 19f improvement in 3MWT with gait WFL and no L knee pain to demo improved tolerance to ambulation.     Baseline  12/31: 7164f continued gait deviations and L knee pain    Time  4    Period  Weeks    Status  On-going      PT LONG TERM GOAL #3   Title  Pt will report being able to walk for 2 hours without L knee pain in order to further demo improved strength and functional mobility and allow her to return to some of her leisure activities with greater ease.     Baseline  12/31: has been averaging about 1 hour, with pain about 4/10    Time  4    Period  Weeks    Status  On-going      PT LONG TERM GOAL #4   Title  Pt will report being able to stand for 3 hours without L knee pain or LBP in order to further demo improved functional and core strength in order to further maximize her work duties.    Baseline  12/31: 1 hour, 4/10 LBP and L knee pain    Time  4    Period  Weeks    Status  On-going            Plan - 06/11/18 1153    Clinical Impression Statement  Therapist urged pt to complete some exercises everyday; pt vocalized understanding.  PT states that her knee is her main concern at this time.   Therapist explained that the majority of the exercises that we have her completing will assist in strengthening her knee which should relieve her knee pain as well.     Rehab Potential  Fair    PT Frequency  2x / week    PT Duration  4 weeks    PT Treatment/Interventions  ADLs/Self Care Home Management;Aquatic Therapy;Cryotherapy;Electrical Stimulation;Moist Heat;Traction;Ultrasound;DME Instruction;Gait training;Stair training;Functional mobility training;Therapeutic activities;Therapeutic exercise;Balance training;Neuromuscular re-education;Patient/family education;Orthotic Fit/Training;Manual techniques;Scar mobilization;Passive range of motion;Dry needling;Energy conservation;Taping;Spinal Manipulations;Joint Manipulations    PT Next Visit Plan  f/u on potential aquatic therapy; reapply k-tape to allow her to strengthen in pain-free range; Encourage patient to wear 1/8" to 1/4" heel lift for Lt foot. Continue lumbar/thoracic mobility/stretching and functional hip and core strengthening. Focus on exercises for Lt knee with for patella tracking with quad strengthening.     PT Home Exercise Plan  eval: supine HS stretch (hands behind thigh), supine piriformis stretch, supine figure 4; 12/31: prone quad stretch     Consulted and Agree with Plan of Care  Patient       Patient will benefit from skilled therapeutic intervention in order to improve the following deficits and impairments:  Abnormal gait, Decreased activity tolerance, Decreased balance, Decreased range of motion, Decreased strength, Difficulty walking, Hypomobility, Increased fascial restricitons, Increased muscle spasms, Impaired flexibility, Improper body mechanics, Postural dysfunction, Pain  Visit Diagnosis: Chronic bilateral low back pain without sciatica  Difficulty in walking, not elsewhere classified     Problem List Patient Active Problem List   Diagnosis Date Noted  . Restless leg syndrome 08/30/2017  . Seasonal  allergies 02/22/2017  . Breast mass, left 09/10/2016  . Primary osteoarthritis of right knee   . Medial meniscus, posterior horn derangement   . Dysesthesia 10/15/2014  . Gait disturbance 10/15/2014  . Essential hypertension, benign 05/27/2013  . Multiple sclerosis ([place]) 03/06/2013  . Hypertriglyceridemia 11/02/2012  . Hypokalemia 11/02/2012  . Depression with anxiety 11/02/2012  . DS (disseminated sclerosis) ([place]) 03/22/2011  . DIVERTICULITIS, COLON 08/11/2009  . IRRITABLE BOWEL SYNDROME 08/11/2009    Rayetta Humphrey, PT CLT [phone] 06/11/2018, 12:06 PM  [facility] [address] [place], [place], [address] Phone: [phone]   Fax:  [phone]  Name: NAYELLI INGLIS MRN: [id number] Date of Birth: 19-Dec-1955

## 2018-06-11 NOTE — Telephone Encounter (Signed)
Ok times one, still needs o v see note fr week ago, may have had to change pending visit, I dont know

## 2018-06-11 NOTE — Telephone Encounter (Signed)
30 day supply sent to pharm with a note that she needs office visit

## 2018-06-11 NOTE — Telephone Encounter (Signed)
06/11/18 left a message to offer a 10:30 or 11:15 appt from the wait list

## 2018-06-11 NOTE — Telephone Encounter (Signed)
Pharmacy requesting refill on Sertraline 100 mg tablet. Take one tablet by mouth every day.

## 2018-06-13 ENCOUNTER — Encounter

## 2018-06-13 ENCOUNTER — Encounter (HOSPITAL_COMMUNITY): Payer: Commercial Managed Care - PPO

## 2018-06-13 ENCOUNTER — Ambulatory Visit (INDEPENDENT_AMBULATORY_CARE_PROVIDER_SITE_OTHER): Payer: Commercial Managed Care - PPO | Admitting: Orthopedic Surgery

## 2018-06-13 ENCOUNTER — Encounter: Payer: Self-pay | Admitting: Orthopedic Surgery

## 2018-06-13 VITALS — BP 136/80 | HR 98 | Ht 64.0 in | Wt 172.0 lb

## 2018-06-13 DIAGNOSIS — M1712 Unilateral primary osteoarthritis, left knee: Secondary | ICD-10-CM

## 2018-06-13 DIAGNOSIS — M5136 Other intervertebral disc degeneration, lumbar region: Secondary | ICD-10-CM

## 2018-06-13 NOTE — Progress Notes (Signed)
Progress Note   Patient ID: Krystal Silva, female   DOB: 21-Feb-1956, 63 y.o.   MRN: [id number]   Chief Complaint  Patient presents with  . Knee Pain    left     63 years old history of osteoarthritis degenerative disc disease status post injection left knee currently undergoing physical therapy for lower back pain GI intolerance secondary to GI bleed currently takes 1 Aleve occasionally and uses topical arthritis cream.  Tylenol arthritis has not been helpful.  She did get good relief from the injection  Complains of intermediate medial knee pain and continued lower back pain but overall improved    ROS   Allergies  Allergen Reactions  . Codeine   . Penicillins     REACTION: rash  . Sulfonamide Derivatives   . Sulfamethoxazole Rash     BP 136/80   Pulse 98   Ht 5\' 4"  (1.626 m)   Wt 172 lb (78 kg)   BMI 29.52 kg/m   Physical Exam Musculoskeletal:       Legs:      Medical decisions:   Data  Imaging:   Previous imaging studies showed narrowing of the medial compartment with tibiofemoral angle of 0 degrees patellofemoral overhang on the axial view  Encounter Diagnoses  Name Primary?  . DDD (degenerative disc disease), lumbar Yes  . Arthritis of left knee     PLAN:   Recommend continue with topical medication  Follow-up with me in a month left knee for osteoarthritis and continue with exercises for the lower back    Arther Abbott, MD 06/13/2018 9:08 AM

## 2018-06-15 ENCOUNTER — Ambulatory Visit (HOSPITAL_COMMUNITY): Payer: Commercial Managed Care - PPO

## 2018-06-15 ENCOUNTER — Encounter (HOSPITAL_COMMUNITY): Payer: Self-pay

## 2018-06-15 DIAGNOSIS — M545 Low back pain, unspecified: Secondary | ICD-10-CM

## 2018-06-15 DIAGNOSIS — G8929 Other chronic pain: Secondary | ICD-10-CM

## 2018-06-15 DIAGNOSIS — R29898 Other symptoms and signs involving the musculoskeletal system: Secondary | ICD-10-CM

## 2018-06-15 DIAGNOSIS — R262 Difficulty in walking, not elsewhere classified: Secondary | ICD-10-CM

## 2018-06-15 DIAGNOSIS — M25562 Pain in left knee: Secondary | ICD-10-CM

## 2018-06-15 NOTE — Patient Instructions (Signed)
FUNCTIONAL MOBILITY: Wall Squat    Stance: shoulder-width on floor, against wall. Place feet in front of hips. Bend hips and knees. Keep back straight.  Place small ball/towel between knees.  Do not allow knees to bend past toes. Squeeze glutes and quads to stand.  10 reps per set, hold 3 seconds.  4 days per week  Copyright  VHI. All rights reserved.   Kinesiotape:

## 2018-06-15 NOTE — Therapy (Addendum)
[facility] [address] [place], [place], [address] Phone: [phone]   Fax:  [phone]   PHYSICAL THERAPY DISCHARGE SUMMARY  Visits from Start of Care: 12  Current functional level related to goals / functional outcomes: See below   Remaining deficits: See below   Education / Equipment: See below Plan: Patient agrees to discharge.  Patient goals were partially met. Patient is being discharged due to the patient's request.  ?????   Geraldine Solar PT, DPT     Physical Therapy Treatment  Patient Details  Name: Krystal Silva MRN: [id number] Date of Birth: Oct 15, 1955 Referring Provider (PT): Arther Abbott, MD   Encounter Date: 06/15/2018  PT End of Session - 06/15/18 0826    Visit Number  12    Number of Visits  17    Date for PT Re-Evaluation  06/19/18    Authorization Type  [employer] PPO    Authorization Time Period  04/27/18 to 05/25/17; NEW: 05/22/18 to 06/19/18    Authorization - Visit Number  12    Authorization - Number of Visits  30   11/20/17 to 11/20/18 benefit plan year   PT Start Time  0817    PT Stop Time  0902    PT Time Calculation (min)  45 min    Activity Tolerance  Patient tolerated treatment well    Behavior During Therapy  [employer] for tasks assessed/performed       Past Medical History:  Diagnosis Date  . Allergy   . Diverticulitis   . Hypercholesteremia   . Hypertension   . Insomnia   . MS (multiple sclerosis) ([place])   . Vitamin D deficiency     Past Surgical History:  Procedure Laterality Date  . COLONOSCOPY  08/21/2009  . KNEE ARTHROSCOPY WITH MEDIAL MENISECTOMY Right 03/27/2015   Procedure: KNEE ARTHROSCOPY WITH PARTIAL MEDIAL MENISECTOMY AND MICROFRACTURE;  Surgeon: Carole Civil, MD;  Location: [facility];  Service: Orthopedics;  Laterality: Right;  . TONSILLECTOMY    . WISDOM TOOTH EXTRACTION  1980's    There were no vitals filed for this visit.  Subjective Assessment - 06/15/18 0820     Subjective  Pt stated her back is feeling good today.  Pt stated she wishes to be discharged today.  Continued to have sharp in Lt knee, pain scale 7/10 today.  Interested in education for kinesiotaping     How long can you sit comfortably?  no issues (this is LBP)    How long can you stand comfortably?  Able to stand comfortably for an1 hour    How long can you walk comfortably?  Limited by ability to walk due to knee pain, probably 30 minutes with immediate pain. (limps when it's hurting; stays on her feet even on bad days)    Patient Stated Goals  continue to walk and not have tons of pain    Currently in Pain?  Yes    Pain Score  7     Pain Location  Knee    Pain Orientation  Left    Pain Descriptors / Indicators  Sharp    Pain Type  Chronic pain    Pain Onset  More than a month ago    Pain Frequency  Intermittent    Aggravating Factors   movement, activity, housework    Pain Relieving Factors  rest, medicine    Effect of Pain on Daily Activities  limits time walking or standing at work  [facility] PT Assessment - 06/15/18 0001      Assessment   Medical Diagnosis  DDD, lumbar    Referring Provider (PT)  Arther Abbott, MD    Onset Date/Surgical Date  --   couple months ago   Next MD Visit  07/16/18    Prior Therapy  yes after R knee menisectomy      Observation/Other Assessments   Focus on Therapeutic Outcomes (FOTO)   38% limitation   was 44% limited     Strength   Right Hip Extension  4+/5   was 4/5   Right Hip ABduction  4+/5   was 4+   Left Hip Extension  4/5   was 4/5   Left Hip ABduction  4+/5   was 4+   Right Knee Flexion  5/5   was 4+   Right Knee Extension  5/5    Left Knee Flexion  4+/5   was 4+   Left Knee Extension  5/5      Palpation   Patella mobility  WNL       Ambulation/Gait   Ambulation Distance (Feet)  688 Feet   3MWT was 728f, limited by Lt knee pain   Assistive device  None    Gait Pattern  Step-through  pattern;Trendelenburg;Antalgic;Lateral trunk lean to left                   [facility] Adult PT Treatment/Exercise - 06/15/18 0001      Lumbar Exercises: Standing   Heel Raises  10 reps    Functional Squats  10 reps    Wall Slides  10 reps;3 seconds   ball between knees   Scapular Retraction Limitations  HEP    Row Limitations  HEP    Shoulder Extension Limitations  HEP    Other Standing Lumbar Exercises  SLS 31" max of 5; vector stance 5x5" holds      Manual Therapy   Manual Therapy  Taping    Manual therapy comments  separate rest of treatment    Kinesiotex  Ligament Correction      Kinesiotix   Ligament Correction  Lt knee medial patella tracking for pain control wiht WB               PT Short Term Goals - 05/22/18 06962     PT SHORT TERM GOAL #1   Title  Pt will have improved MMT by 1/2 grade throughout in order to reduce pain and maximize gait.    Time  2    Period  Weeks    Status  Partially Met      PT SHORT TERM GOAL #2   Title  Pt will report being able to walk for 1 hour with 3/10 L knee pain in order to maximize her work duties.    Baseline  12/31: has been averaging about 1 hour, with pain about 4/10    Time  2    Period  Weeks    Status  Partially Met      PT SHORT TERM GOAL #3   Title  Pt will report being able to stand for 2 hours with 3/10 L knee pain and LBP in order to maximize her work duties and demo improved functional strength.    Baseline  12/31: 1 hour, 4/10 LBP and L knee pain    Time  2    Period  Weeks    Status  On-going  PT SHORT TERM GOAL #4   Title  Pt will be able to perform R SLS for 30 sec or > to be symmetrical with her LLE in order to maximize gait and stairs.     Baseline  12/31: 10.6 RLE, 34 LLE    Time  2    Period  Weeks    Status  On-going        PT Long Term Goals - 05/22/18 6754      PT LONG TERM GOAL #1   Title  Pt will have improved MMT by 1 grade throughout in order to further reduce LBP and L  knee pain in order to maximize stairs and return to PLOF.     Time  4    Period  Weeks    Status  On-going      PT LONG TERM GOAL #2   Title  Pt will have 133f improvement in 3MWT with gait WFL and no L knee pain to demo improved tolerance to ambulation.     Baseline  12/31: 7135f continued gait deviations and L knee pain    Time  4    Period  Weeks    Status  On-going      PT LONG TERM GOAL #3   Title  Pt will report being able to walk for 2 hours without L knee pain in order to further demo improved strength and functional mobility and allow her to return to some of her leisure activities with greater ease.     Baseline  12/31: has been averaging about 1 hour, with pain about 4/10    Time  4    Period  Weeks    Status  On-going      PT LONG TERM GOAL #4   Title  Pt will report being able to stand for 3 hours without L knee pain or LBP in order to further demo improved functional and core strength in order to further maximize her work duties.    Baseline  12/31: 1 hour, 4/10 LBP and L knee pain    Time  4    Period  Weeks    Status  On-going            Plan - 06/15/18 1343    Clinical Impression Statement  Pt reports she feels ready for discharge today.  Reports her back is feeling good but continues to be limited by pain in Lt knee.  Reviewed goals, MMT and 2MWT to assess progress with some improvements with hip strengthening noted.  Pt with decreased cadence during 2MWT due to pain.  Resumed kinesiotape for medial patella tracking with reports of pain reduced during weight bearing exercises.  Pt educated on techniques to complete at home wiht verbalized understanding as well as s/s with skin integrity following tape.  Improved perceived functional ability from 44% to 38% limitaiton with FOTO score.  Reviewed importance of compliance with HEP.      Rehab Potential  Fair    PT Frequency  2x / week    PT Duration  4 weeks    PT Treatment/Interventions  ADLs/Self Care Home  Management;Aquatic Therapy;Cryotherapy;Electrical Stimulation;Moist Heat;Traction;Ultrasound;DME Instruction;Gait training;Stair training;Functional mobility training;Therapeutic activities;Therapeutic exercise;Balance training;Neuromuscular re-education;Patient/family education;Orthotic Fit/Training;Manual techniques;Scar mobilization;Passive range of motion;Dry needling;Energy conservation;Taping;Spinal Manipulations;Joint Manipulations    PT Next Visit Plan  DC to HEP per pt. request    PT Home Exercise Plan  eval: supine HS stretch (hands behind thigh), supine piriformis stretch, supine figure 4;  12/31: prone quad stretch ; 06/15/18: wall squats with ball between knees       Patient will benefit from skilled therapeutic intervention in order to improve the following deficits and impairments:  Abnormal gait, Decreased activity tolerance, Decreased balance, Decreased range of motion, Decreased strength, Difficulty walking, Hypomobility, Increased fascial restricitons, Increased muscle spasms, Impaired flexibility, Improper body mechanics, Postural dysfunction, Pain  Visit Diagnosis: Chronic bilateral low back pain without sciatica  Difficulty in walking, not elsewhere classified  Other symptoms and signs involving the musculoskeletal system  Acute pain of left knee     Problem List Patient Active Problem List   Diagnosis Date Noted  . Restless leg syndrome 08/30/2017  . Seasonal allergies 02/22/2017  . Breast mass, left 09/10/2016  . Primary osteoarthritis of right knee   . Medial meniscus, posterior horn derangement   . Dysesthesia 10/15/2014  . Gait disturbance 10/15/2014  . Essential hypertension, benign 05/27/2013  . Multiple sclerosis ([place]) 03/06/2013  . Hypertriglyceridemia 11/02/2012  . Hypokalemia 11/02/2012  . Depression with anxiety 11/02/2012  . DS (disseminated sclerosis) ([place]) 03/22/2011  . DIVERTICULITIS, COLON 08/11/2009  . IRRITABLE BOWEL SYNDROME 08/11/2009    Ihor Austin, LPTA; [place]  Aldona Lento 06/15/2018, 2:50 PM  [facility] [address] [place], [place], [address] Phone: [phone]   Fax:  [phone]  Name: Krystal Silva MRN: [id number] Date of Birth: 06-21-1955

## 2018-06-20 ENCOUNTER — Encounter (HOSPITAL_COMMUNITY): Payer: Commercial Managed Care - PPO

## 2018-06-22 ENCOUNTER — Other Ambulatory Visit: Payer: Self-pay | Admitting: Family Medicine

## 2018-06-22 ENCOUNTER — Ambulatory Visit: Payer: Commercial Managed Care - PPO | Admitting: Neurology

## 2018-06-22 ENCOUNTER — Encounter (HOSPITAL_COMMUNITY): Payer: Commercial Managed Care - PPO

## 2018-07-10 ENCOUNTER — Other Ambulatory Visit: Payer: Self-pay | Admitting: Family Medicine

## 2018-07-11 ENCOUNTER — Telehealth: Payer: Self-pay | Admitting: Orthopedic Surgery

## 2018-07-11 ENCOUNTER — Other Ambulatory Visit: Payer: Self-pay | Admitting: Family Medicine

## 2018-07-11 NOTE — Telephone Encounter (Signed)
Patient canceled her appointment for Monday 2/24 and will call back to reschedule later on. She had a question about getting another shot and wanted to speak with you.  Please call and advise

## 2018-07-11 NOTE — Telephone Encounter (Signed)
Patient states she had reaction / the next day her face was flushed and felt hot. I have advised this is likely a side effect from the injection, but is not considered an allergy

## 2018-07-11 NOTE — Telephone Encounter (Signed)
Patient returned your call and said you could call her back at her work number [phone].

## 2018-07-11 NOTE — Telephone Encounter (Signed)
Left message for her to call me back. 

## 2018-07-11 NOTE — Telephone Encounter (Signed)
Call pt, if agrees to visit, sched and then ref times one, carolyn in April rec f u in october

## 2018-07-11 NOTE — Telephone Encounter (Signed)
Patient scheduled appt for follow up and wellness

## 2018-07-11 NOTE — Telephone Encounter (Signed)
Tried calling no answer left a vm to r/c.

## 2018-07-15 ENCOUNTER — Other Ambulatory Visit: Payer: Self-pay | Admitting: Neurology

## 2018-07-16 ENCOUNTER — Ambulatory Visit: Payer: Commercial Managed Care - PPO | Admitting: Orthopedic Surgery

## 2018-08-05 ENCOUNTER — Other Ambulatory Visit: Payer: Self-pay | Admitting: Family Medicine

## 2018-08-22 ENCOUNTER — Ambulatory Visit: Payer: Commercial Managed Care - PPO | Admitting: Neurology

## 2018-08-27 ENCOUNTER — Telehealth: Payer: Self-pay | Admitting: Neurology

## 2018-08-27 NOTE — Telephone Encounter (Signed)
I called patient back. I relayed Dr. Garth Bigness message. She verbalized understanding. 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 She will discuss further with her work. Nothing further needed.

## 2018-08-27 NOTE — Telephone Encounter (Signed)
Because Avonex modulates the immune system rather than suppress it (like some of the stronger meds), she can follow general rules (standard statewide social distancing, hand washing, etc as outlined at [email or web address])

## 2018-08-27 NOTE — Telephone Encounter (Signed)
Pt states she is still working for [employer] and she is asking for a note from Dr Felecia Shelling stating limitations on what she can do.  Pt is asking for a call to discuss further.

## 2018-08-29 ENCOUNTER — Other Ambulatory Visit: Payer: Self-pay | Admitting: Family Medicine

## 2018-08-29 NOTE — Telephone Encounter (Signed)
30 days, call pt ansd sched virt visit this mo

## 2018-08-29 NOTE — Telephone Encounter (Signed)
Medication sent in. Left message to return call 

## 2018-08-30 ENCOUNTER — Ambulatory Visit: Payer: Commercial Managed Care - PPO | Admitting: Neurology

## 2018-09-03 NOTE — Telephone Encounter (Signed)
Telephone visit scheduled with Dr Richardson Landry 09/17/18

## 2018-09-17 ENCOUNTER — Other Ambulatory Visit: Payer: Self-pay

## 2018-09-17 ENCOUNTER — Encounter: Payer: Self-pay | Admitting: Family Medicine

## 2018-09-17 ENCOUNTER — Ambulatory Visit (INDEPENDENT_AMBULATORY_CARE_PROVIDER_SITE_OTHER): Payer: Commercial Managed Care - PPO | Admitting: Family Medicine

## 2018-09-17 DIAGNOSIS — E781 Pure hyperglyceridemia: Secondary | ICD-10-CM | POA: Diagnosis not present

## 2018-09-17 DIAGNOSIS — F418 Other specified anxiety disorders: Secondary | ICD-10-CM

## 2018-09-17 DIAGNOSIS — I10 Essential (primary) hypertension: Secondary | ICD-10-CM

## 2018-09-17 MED ORDER — SIMVASTATIN 40 MG PO TABS: ORAL_TABLET | ORAL | 1 refills | Status: DC

## 2018-09-17 MED ORDER — SERTRALINE HCL 100 MG PO TABS: 100.0000 mg | ORAL_TABLET | Freq: Every day | ORAL | 1 refills | Status: DC

## 2018-09-17 MED ORDER — POTASSIUM CHLORIDE CRYS ER 10 MEQ PO TBCR: 10.0000 meq | EXTENDED_RELEASE_TABLET | Freq: Every day | ORAL | 1 refills | Status: DC

## 2018-09-17 MED ORDER — LOSARTAN POTASSIUM-HCTZ 100-12.5 MG PO TABS: ORAL_TABLET | ORAL | 1 refills | Status: DC

## 2018-09-17 NOTE — Progress Notes (Signed)
Subjective:  Audio visit  Patient ID: Krystal Silva, female    DOB: 07-23-1955, 63 y.o.   MRN: [id number] Format phone  Patient present at home Provider present at office Consent for interaction obtained Coronavirus outbreak made virtual visit necessary  Hyperlipidemia  This is a chronic problem. The current episode started more than 1 year ago. Compliance problems: takes meds every day, eats healthy, not able to do much exercise.    Pt states no concerns.   Virtual Visit via Telephone Note  I connected with Krystal Silva [facility] on 09/17/18 at  9:00 AM EDT by telephone and verified that I am speaking with the correct person using two identifiers.   I discussed the limitations, risks, security and privacy concerns of performing an evaluation and management service by telephone and the availability of in person appointments. I also discussed with the patient that there may be a patient responsible charge related to this service. The patient expressed understanding and agreed to proceed.   History of Present Illness:    Observations/Objective:   Assessment and Plan:   Follow Up Instructions:    I discussed the assessment and treatment plan with the patient. The patient was provided an opportunity to ask questions and all were answered. The patient agreed with the plan and demonstrated an understanding of the instructions.   The patient was advised to call back or seek an in-person evaluation if the symptoms worsen or if the condition fails to improve as anticipated.  I provided  minutes of non-face-to-face time during this encounter.  Blood pressure medicine and blood pressure levels reviewed today with patient. Compliant with blood pressure medicine. States does not miss a dose. No obvious side effects. Blood pressure generally good when checked elsewhere. Watching salt intake.   Blood pressure medicine and blood pressure levels reviewed today with patient. Compliant with blood  pressure medicine. States does not miss a dose. No obvious side effects. Blood pressure generally good when checked elsewhere. Watching salt intake.  Patient continues to take lipid medication regularly. No obvious side effects from it. Generally does not miss a dose. Prior blood work results are reviewed with patient. Patient continues to work on fat intake in diet  Patient notes ongoing compliance with antidepressant medication. No obvious side effects. Reports does not miss a dose. Overall continues to help depression substantially. No thoughts of homicide or suicide. Would like to maintain medication.  Patient notes some understandable ongoing anxiety with the coronavirus challenges.  Followed by specialist for her multiple sclerosis  Trying to watch her diet best that she can.  Not exercising much at this time.  Blood pressures have been good when checked elsewhere    Review of Systems No headache, no major weight loss or weight gain, no chest pain no back pain abdominal pain no change in bowel habits complete ROS otherwise negative     Objective:   Physical Exam    Virtual visit    Assessment & Plan:  Impression 1 hypertension good control discussed with discussed to maintain same medication  2.  Hyperlipidemia.  Ongoing.  Discussed.  Prior blood work reviewed compliance discussed to maintain same meds  3.  Depression/anxiety clinically stable.  Handling medicines well no obvious side effects patient maintain  Diet exercise discussed.  Medications refilled.  Get blood work results to [place] when possible follow-up in 6 months.  Advised patient to call back in a few months regarding potential availability of clinician to do wellness exam  portion

## 2018-09-18 ENCOUNTER — Encounter: Payer: Commercial Managed Care - PPO | Admitting: Family Medicine

## 2018-10-07 ENCOUNTER — Other Ambulatory Visit: Payer: Self-pay | Admitting: Neurology

## 2018-10-24 ENCOUNTER — Other Ambulatory Visit (HOSPITAL_COMMUNITY): Payer: Self-pay | Admitting: Family Medicine

## 2018-10-24 DIAGNOSIS — Z1231 Encounter for screening mammogram for malignant neoplasm of breast: Secondary | ICD-10-CM

## 2018-10-29 ENCOUNTER — Other Ambulatory Visit: Payer: Self-pay

## 2018-10-29 ENCOUNTER — Ambulatory Visit (HOSPITAL_COMMUNITY)
Admission: RE | Admit: 2018-10-29 | Discharge: 2018-10-29 | Disposition: A | Discharge status: Home / Self Care | Payer: Commercial Managed Care - PPO | Source: Ambulatory Visit | Attending: Family Medicine | Admitting: Family Medicine

## 2018-10-29 DIAGNOSIS — Z1231 Encounter for screening mammogram for malignant neoplasm of breast: Secondary | ICD-10-CM

## 2018-10-30 ENCOUNTER — Telehealth: Payer: Self-pay | Admitting: *Deleted

## 2018-10-30 NOTE — Telephone Encounter (Signed)
Called pt. She is going to do virtual visit with Dr. Felecia Shelling. I emailed link at [email or web address]. confirmed she received while on the phone. Explained steps to her. She will use either work laptop or phone for visit.   Updated med list, pharmacy, allergies on file. Asked her to send mychart message if she has any trouble.

## 2018-10-31 ENCOUNTER — Encounter: Payer: Self-pay | Admitting: Neurology

## 2018-10-31 ENCOUNTER — Other Ambulatory Visit: Payer: Self-pay

## 2018-10-31 ENCOUNTER — Encounter

## 2018-10-31 ENCOUNTER — Ambulatory Visit (INDEPENDENT_AMBULATORY_CARE_PROVIDER_SITE_OTHER): Payer: Commercial Managed Care - PPO | Admitting: Neurology

## 2018-10-31 DIAGNOSIS — G2581 Restless legs syndrome: Secondary | ICD-10-CM

## 2018-10-31 DIAGNOSIS — R269 Unspecified abnormalities of gait and mobility: Secondary | ICD-10-CM

## 2018-10-31 DIAGNOSIS — G35 Multiple sclerosis: Secondary | ICD-10-CM | POA: Diagnosis not present

## 2018-10-31 DIAGNOSIS — R208 Other disturbances of skin sensation: Secondary | ICD-10-CM | POA: Diagnosis not present

## 2018-10-31 MED ORDER — BACLOFEN 10 MG PO TABS: ORAL_TABLET | ORAL | 3 refills | Status: DC

## 2018-10-31 MED ORDER — IMIPRAMINE HCL 25 MG PO TABS: ORAL_TABLET | ORAL | 3 refills | Status: DC

## 2018-10-31 NOTE — Progress Notes (Signed)
[facility]  PATIENT: Krystal Silva [facility] DOB: Jun 08, 1955  REFERRING DOCTOR OR PCP:   Coralie Keens SOURCE: patient, records from initial Neurology group and MRI images on PACS/CD  _________________________________   HISTORICAL  CHIEF COMPLAINT:  Chief Complaint  Patient presents with  . Multiple Sclerosis    On Avonex    HISTORY OF PRESENT ILLNESS:  Krystal Silva is a 63 y.o.  woman with MS.     Update 10/31/2018: Virtual Visit via Video Note I connected with Krystal Silva  on 10/31/18 at 11:00 AM EDT by a video enabled telemedicine application and verified that I am speaking with the correct person.  I discussed the limitations of evaluation and management by telemedicine and the availability of in person appointments. The patient expressed understanding and agreed to proceed.  History of Present Illness: She feels her MS has been stable.  She is on Avonex. No exacerbations and 01/24/2018 MRI showed no new lesions.    She get injection site pain and is having more difficulty finding good injection sites.  In the past, we had discussed oral agents she has been reluctant to make a switch.   Her gait is off but she feels the knees play a larger role than the MS.   She has done PT but no lasting benefit. Acupuncture would help 1-2 days.    She has dysesthetic pain in her legs.  This pain has been improved by imipramine but she still wakes up with pain there.   She finds she needs to move around some nights to get comfortable.   She denies any major problems with her bladder function.  Vision is good.    She has physical fatigue that is worse with heat.  She has sleep maintenance insomnia and is better with imipramine.   She has had some depression but feels mood is generally good with the sertraline.  She notes no major problems with cognition.      Observations/Objective: She is a well-developed well-nourished  woman in no acute distress.  The head is normocephalic and atraumatic.  Sclera are anicteric.  Visible skin appears normal.  The neck has a good range of motion.  Pharynx and tongue have normal appearance.  She is alert and fully oriented with fluent speech and good attention, knowledge and memory.  Extraocular muscles are intact.  Facial strength is normal.  Palatal elevation and tongue protrusion are midline.  She appears to have normal strength in the arms.  Rapid alternating movements and finger-nose-finger are performed well.  Assessment and Plan: Multiple sclerosis (HCC)  Dysesthesia  Gait disturbance  Restless leg syndrome  1.   Continue Avonex.  We have had a conversation about different disease modifying therapies since she does have some injection reactions.  She would prefer to stay on Avonex at this time. 2.   Stay active and exercise as tolerated.  Take OTC vitamin D\ 3.   Renew baclofen and imipramine for spasticity, dysesthesias and sleep 4.   She will return to see me in 6 months or sooner if there are new or worsening neurologic symptoms.  Follow Up Instructions: I discussed the assessment and treatment plan with the patient. The patient was provided an opportunity to ask questions and all were answered. The patient agreed with the plan and demonstrated an understanding of the instructions.    The patient was advised to call back or seek an in-person evaluation if the symptoms worsen or  if the condition fails to improve as anticipated.  I provided 25 minutes of non-face-to-face time during this encounter.  _________________________________________  Update 08/30/2017: She feels her MS has been stable.  She is on Avonex and generally tolerates it well though she will sometimes get injection site pain and is having more difficulty finding good injection sites.  In the past, we had discussed oral agents she has been reluctant to make a switch.  She notes mild gait disturbance. .   Knee issues also affect her gait but knee pain is better with acupuncture.   She also has leg dysesthetic pain.  This pain has been improved by imipramine but she still wakes up with pain there.   She finds she needs to move around some nights to get comfortable.  .  She denies any major problems with her bladder function.  Vision is good.    She continues to experience a lot of physical fatigue that is worse with heat.  She sleeps well most nights and her sleep maintenance insomnia was improved with imipramine but she still has RLS.   She has never been on gabapentin..  She has had some depression but feels mood is generally good with the sertraline.  She notes no major problems with cognition.     I reviewed recent labs:   Normal LFT and WBC.   Slightly increased HgBA1c=5.7   Update 02/22/2017:    She is on Avonex and denies any exacerbation.    She finds it hard to find less painful injection sites and is starting to get tired of giving herself shots.   However, because her MS is stable, she is reluctant to change.    Her gait is off, possibly due to right knee arthritis.   She has some leg dysesthestic pain, better with imipramine.    Bladder function is well. Vision is doing well.  She has a lot of fatigue and this is worse with heat.     She sleeps better with imipramine.   Mood is better with sertraline and imipramine.   Cognition is doing well.     She gets a lot of allergies in the fall and that makes her sleepier.   She takes Benadryl at bedtime.  Sometimes she takes a daytime Zyrtec.[name]   Her right knee is still bothering her. She thinks she will need to have a total knee replacement soon.  She takes vit D daily.   The last level (08/2016) was 43.7.     __________________________ From 08/18/2016  MS:   She is on Avonex with some tolerability issues with mild flulike reactions at 1 day after he each injection.   She also has noted some lumps under her skin from the injections, especially  in the thighs.. She denies any recent exacerbation.    Gait/strength/sensation: She notes some difficulty with her gait but this is mostly due to her right knee. However, she does note that her balance is a little bit off at times.   She stumbles but no falls.   She denies any significant weakness in the arms or legs. She has dysesthetic pain in both lower legs.     We tried imipramine and gabapentin but they were poorly tolerated.      She never filled the lamotrigine as she was worried about side effects.  Bladder: She denies any significant problem with urinary function. Specifically, she does not have nocturia, frequency or hesitancy.  Vision: She denies any current MS  related vision problem.  The diplopia she had in 2003 resolved.  She denies diplopia when tired or hot.  She has never had optic neuritis.  Fatigue/sleep: She feels more tired the past year than usual.   Fatigue is both cognitive and physical.  She feels she sleeps well at night.    She does not snore.     Mood/cognition:  She feels mood is a little worse and she is sometimes mildly depressed.     She is on sertraline and tolerates it well.      She denies any significant difficulty with cognition. She has not noted problems with her memory.   She works full time.   Hearing: She wears hearing aids bilaterally.  She has a little bit of tinnitus   She has a family history of deafness.  Knee:   She had recent surgery for knee pain and instability and does not think the surgery helped so a TKR is being considered.     MS History:  In 2003, she had an episode of diplopia. An MRI was performed but she was not diagnosed with MS at that time. In 2006, in [place],  she presented with dysesthetic sensations in the legs, mostly around the shin. She had MRI's performed that were more consistent with MS. She also had a lumbar puncture and the CSF was consistent with MS. The CSF showed oligoclonal bands and IgG index of 1.37 (elevated). She  started to see Dr. George Hugh and then Dr. Shelia Media in 2010/2011 after moving to the area.    An MRI performed in 2011 was reportedly very similar to the one from 2006. She has been on Avonex therapy since diagnosis.[name]  REVIEW OF SYSTEMS: Constitutional: No fevers, chills, sweats, or change in appetite Eyes: No visual changes, double vision, eye pain Ear, nose and throat: No hearing loss, ear pain, nasal congestion, sore throat Cardiovascular: No chest pain, palpitations Respiratory: No shortness of breath at rest or with exertion.   No wheezes GastrointestinaI: No nausea, vomiting, diarrhea, abdominal pain, fecal incontinence Genitourinary: No dysuria, urinary retention or frequency.  No nocturia. Musculoskeletal: No neck pain, back pain Integumentary: No rash, pruritus, skin lesions Neurological: as above Psychiatric: No depression at this time.  No anxiety Endocrine: No palpitations, diaphoresis, change in appetite, change in weigh or increased thirst Hematologic/Lymphatic: No anemia, purpura, petechiae. Allergic/Immunologic: No itchy/runny eyes, nasal congestion, recent allergic reactions, rashes  ALLERGIES: Allergies  Allergen Reactions  . Codeine   . Penicillins     REACTION: rash  . Sulfonamide Derivatives   . Sulfamethoxazole Rash    HOME MEDICATIONS:  Current Outpatient Medications:  .  baclofen (LIORESAL) 10 MG tablet, Take one tablet 3 times daily as needed., Disp: 270 tablet, Rfl: 3 .  Cholecalciferol (VITAMIN D-3) 5000 UNITS TABS, Take by mouth daily., Disp: , Rfl:  .  ibuprofen (ADVIL,MOTRIN) 800 MG tablet, TAKE 1 TABLET BY MOUTH 3 TIMES A DAY AS NEEDED FOR PAIN, Disp: 90 tablet, Rfl: 1 .  imipramine (TOFRANIL) 25 MG tablet, One to two po qHS, Disp: 180 tablet, Rfl: 3 .  Interferon Beta-1a (AVONEX PEN) 30 MCG/0.5ML AJKT, Inject 30 mcg into the muscle once a week., Disp: 12 each, Rfl: 3 .  LACTOBACILLUS RHAMNOSUS, GG, PO, Take 4 capsules by mouth daily. , Disp: , Rfl:   .  losartan-hydrochlorothiazide (HYZAAR) 100-12.5 MG tablet, TAKE 1 TABLET BY MOUTH EVERY DAY FOR BLOOD PRESSURE, Disp: 90 tablet, Rfl: 1 .  Multiple Vitamin (MULTIVITAMIN) tablet,  Take 1 tablet by mouth daily., Disp: , Rfl:  .  NAPROXEN PO, Take by mouth as needed. , Disp: , Rfl:  .  potassium chloride (KLOR-CON M10) 10 MEQ tablet, Take 1 tablet (10 mEq total) by mouth daily., Disp: 90 tablet, Rfl: 1 .  Probiotic Product (PROBIOTIC DAILY PO), Take by mouth., Disp: , Rfl:  .  sertraline (ZOLOFT) 100 MG tablet, Take 1 tablet (100 mg total) by mouth daily., Disp: 90 tablet, Rfl: 1 .  simvastatin (ZOCOR) 40 MG tablet, TAKE 1 TABLET BY MOUTH EVERYDAY AT BEDTIME, Disp: 90 tablet, Rfl: 1 .  vitamin E 400 UNIT capsule, Take 800 Units by mouth daily., Disp: , Rfl:   PAST MEDICAL HISTORY: Past Medical History:  Diagnosis Date  . Allergy   . Diverticulitis   . Hypercholesteremia   . Hypertension   . Insomnia   . MS (multiple sclerosis) ([place])   . Vitamin D deficiency     PAST SURGICAL HISTORY: Past Surgical History:  Procedure Laterality Date  . COLONOSCOPY  08/21/2009  . KNEE ARTHROSCOPY WITH MEDIAL MENISECTOMY Right 03/27/2015   Procedure: KNEE ARTHROSCOPY WITH PARTIAL MEDIAL MENISECTOMY AND MICROFRACTURE;  Surgeon: Carole Civil, MD;  Location: [facility];  Service: Orthopedics;  Laterality: Right;  . TONSILLECTOMY    . WISDOM TOOTH EXTRACTION  1980's    FAMILY HISTORY: Family History  Problem Relation Age of Onset  . Hypertension Mother   . Hyperlipidemia Father   . Hearing loss Father   . Hyperlipidemia Brother   . Diabetes Brother   . Vision loss Maternal Grandmother   . Vision loss Paternal Grandmother   . Cancer Other        breast    SOCIAL HISTORY:  Social History   Socioeconomic History  . Marital status: Divorced    Spouse name: Not on file  . Number of children: Not on file  . Years of education: 52  . Highest education level: Not on file  Occupational  History  . Occupation: work   Scientific laboratory technician  . Financial resource strain: Not on file  . Food insecurity:    Worry: Not on file    Inability: Not on file  . Transportation needs:    Medical: Not on file    Non-medical: Not on file  Tobacco Use  . Smoking status: Former Smoker    Packs/day: 1.50    Years: 30.00    Pack years: 45.00    Types: Cigarettes    Start date: 12/26/1974    Last attempt to quit: 06/22/2004    Years since quitting: 14.3  . Smokeless tobacco: Never Used  Substance and Sexual Activity  . Alcohol use: Yes    Alcohol/week: 1.0 standard drinks    Types: 1 Cans of beer per week    Comment: rare social  . Drug use: No  . Sexual activity: Not Currently  Lifestyle  . Physical activity:    Days per week: Not on file    Minutes per session: Not on file  . Stress: Not on file  Relationships  . Social connections:    Talks on phone: Not on file    Gets together: Not on file    Attends religious service: Not on file    Active member of club or organization: Not on file    Attends meetings of clubs or organizations: Not on file    Relationship status: Not on file  . Intimate partner violence:    Fear  of current or ex partner: Not on file    Emotionally abused: Not on file    Physically abused: Not on file    Forced sexual activity: Not on file  Other Topics Concern  . Not on file  Social History Narrative   Coffee in the morning couple of cups    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[employer]     PHYSICAL EXAM  There were no vitals filed for this visit.  There is no height or weight on file to calculate BMI.   General: The patient is well-developed and well-nourished and in no acute distress  Musculoskeletal:  Back is nontender  Neurologic Exam  Mental status: The patient is alert and oriented x 3 at the time of the examination. The patient has apparent normal recent and remote memory, with an apparently normal  attention span and concentration ability.   Speech is normal.  Cranial nerves: Extraocular movements are full.  Her facial strength and sensation is normal.  The trapezius strength is normal.  Hearing is normal and symmetric.  Motor:  Muscle bulk is normal.   Tone is mildly increased in legs. Strength is  5 / 5 in all 4 extremities except 4+/5 in right EHL.   Sensory: Sensory testing intact to touch and vibration in the arms and legs.  Coordination: She has good finger-nose-finger and heel-to-shin bilaterally.  Gait and station: Station is normal.   Gait shows a mild limp due to right knee pain. Her tandem gait is mildly wide.. Romberg is negative.   Reflexes: Deep tendon reflexes are symmetric and increased bilaterally with  2 beats non-sustained clonus at ankles.         Richard A. Felecia Shelling, MD, PhD 0/16/5537, 48:27 PM Certified in Neurology, Clinical Neurophysiology, Sleep Medicine, Pain Medicine and Neuroimaging  [facility] [address] [place], [place] [address] [phone]

## 2019-01-07 ENCOUNTER — Telehealth: Payer: Self-pay | Admitting: Neurology

## 2019-01-07 NOTE — Telephone Encounter (Signed)
12/28/18 LVM to schedule 4 mo f/u

## 2019-02-10 IMAGING — DX DG CHEST 2V
2 series · 2 of 2 positions shown · non-contrast
Comparison: None.

CLINICAL DATA: Cough and chest congestion over the last 5 days.

EXAM:
CHEST  2 VIEW

[chest pa]
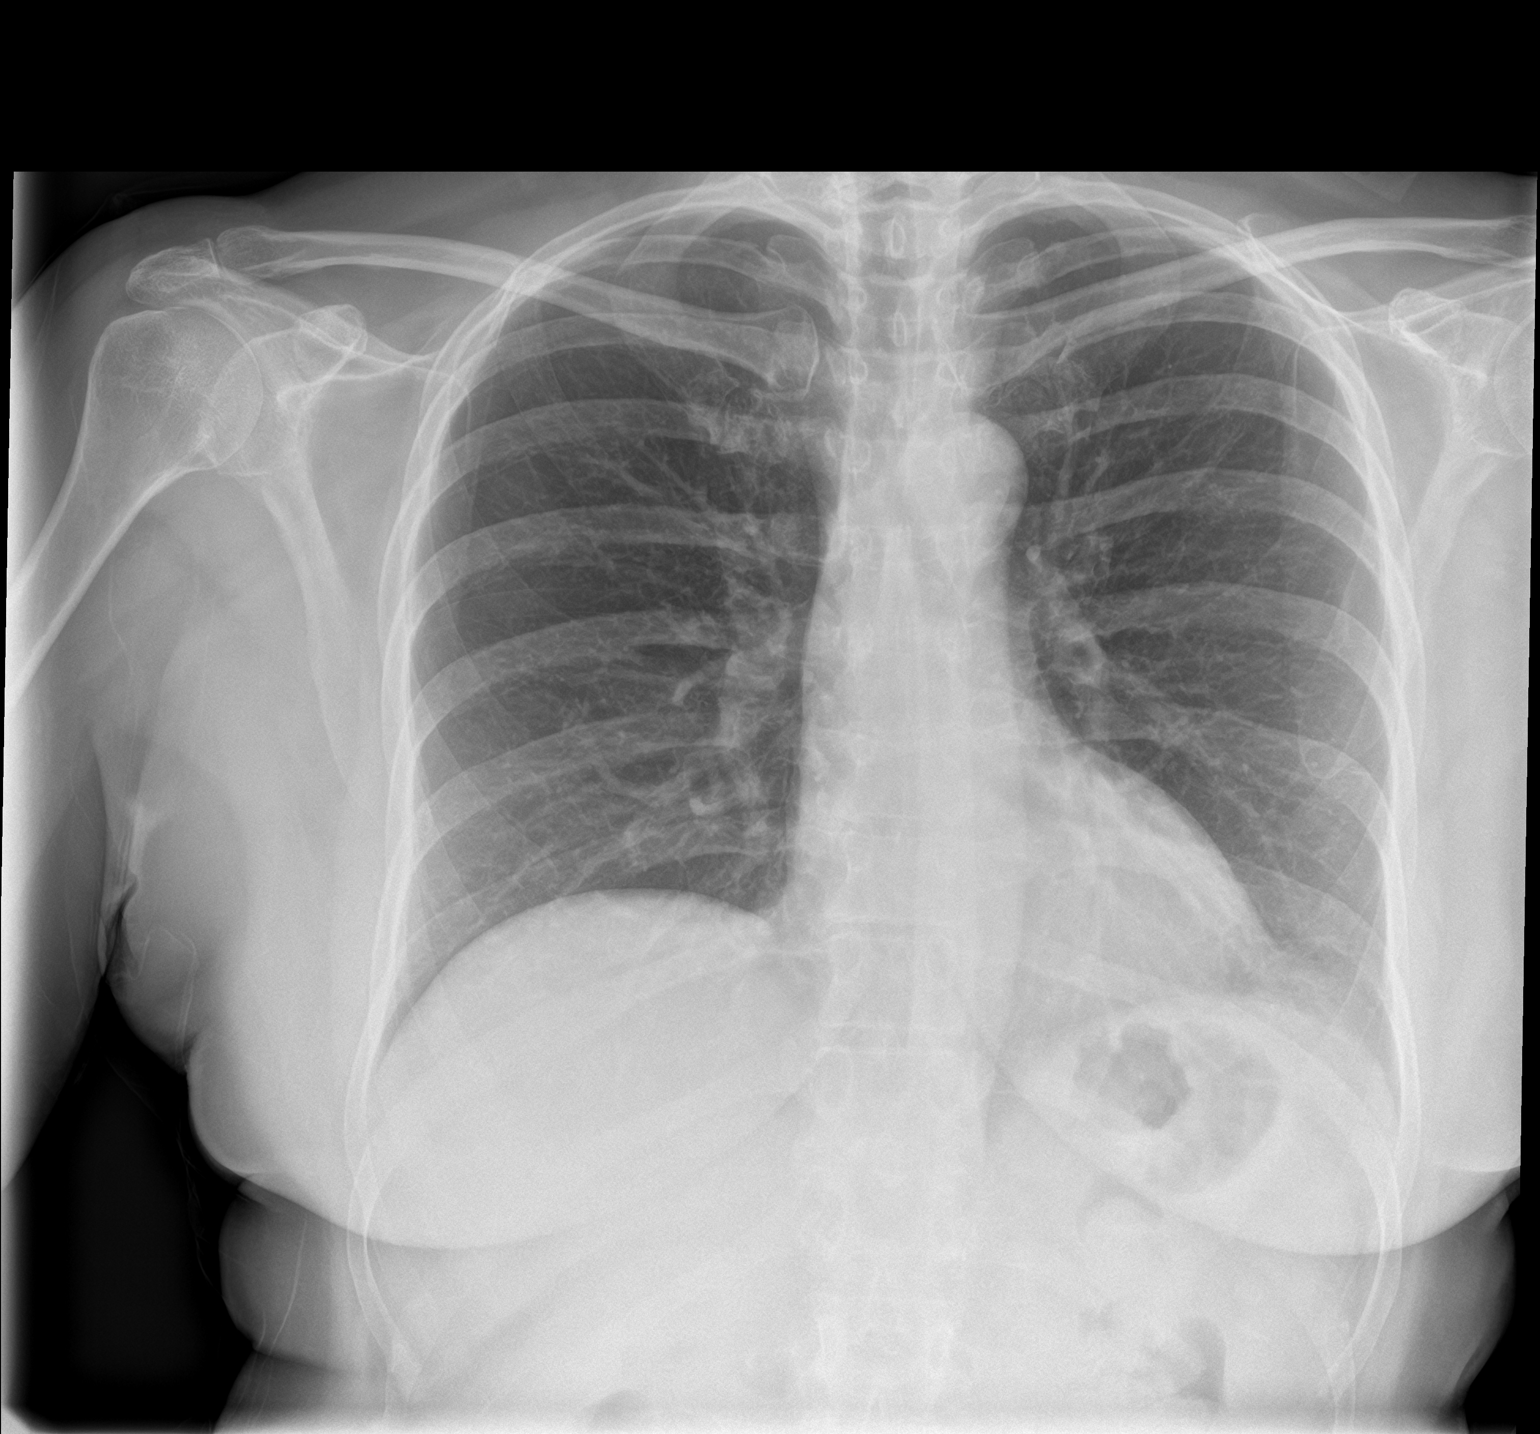

[chest lat]
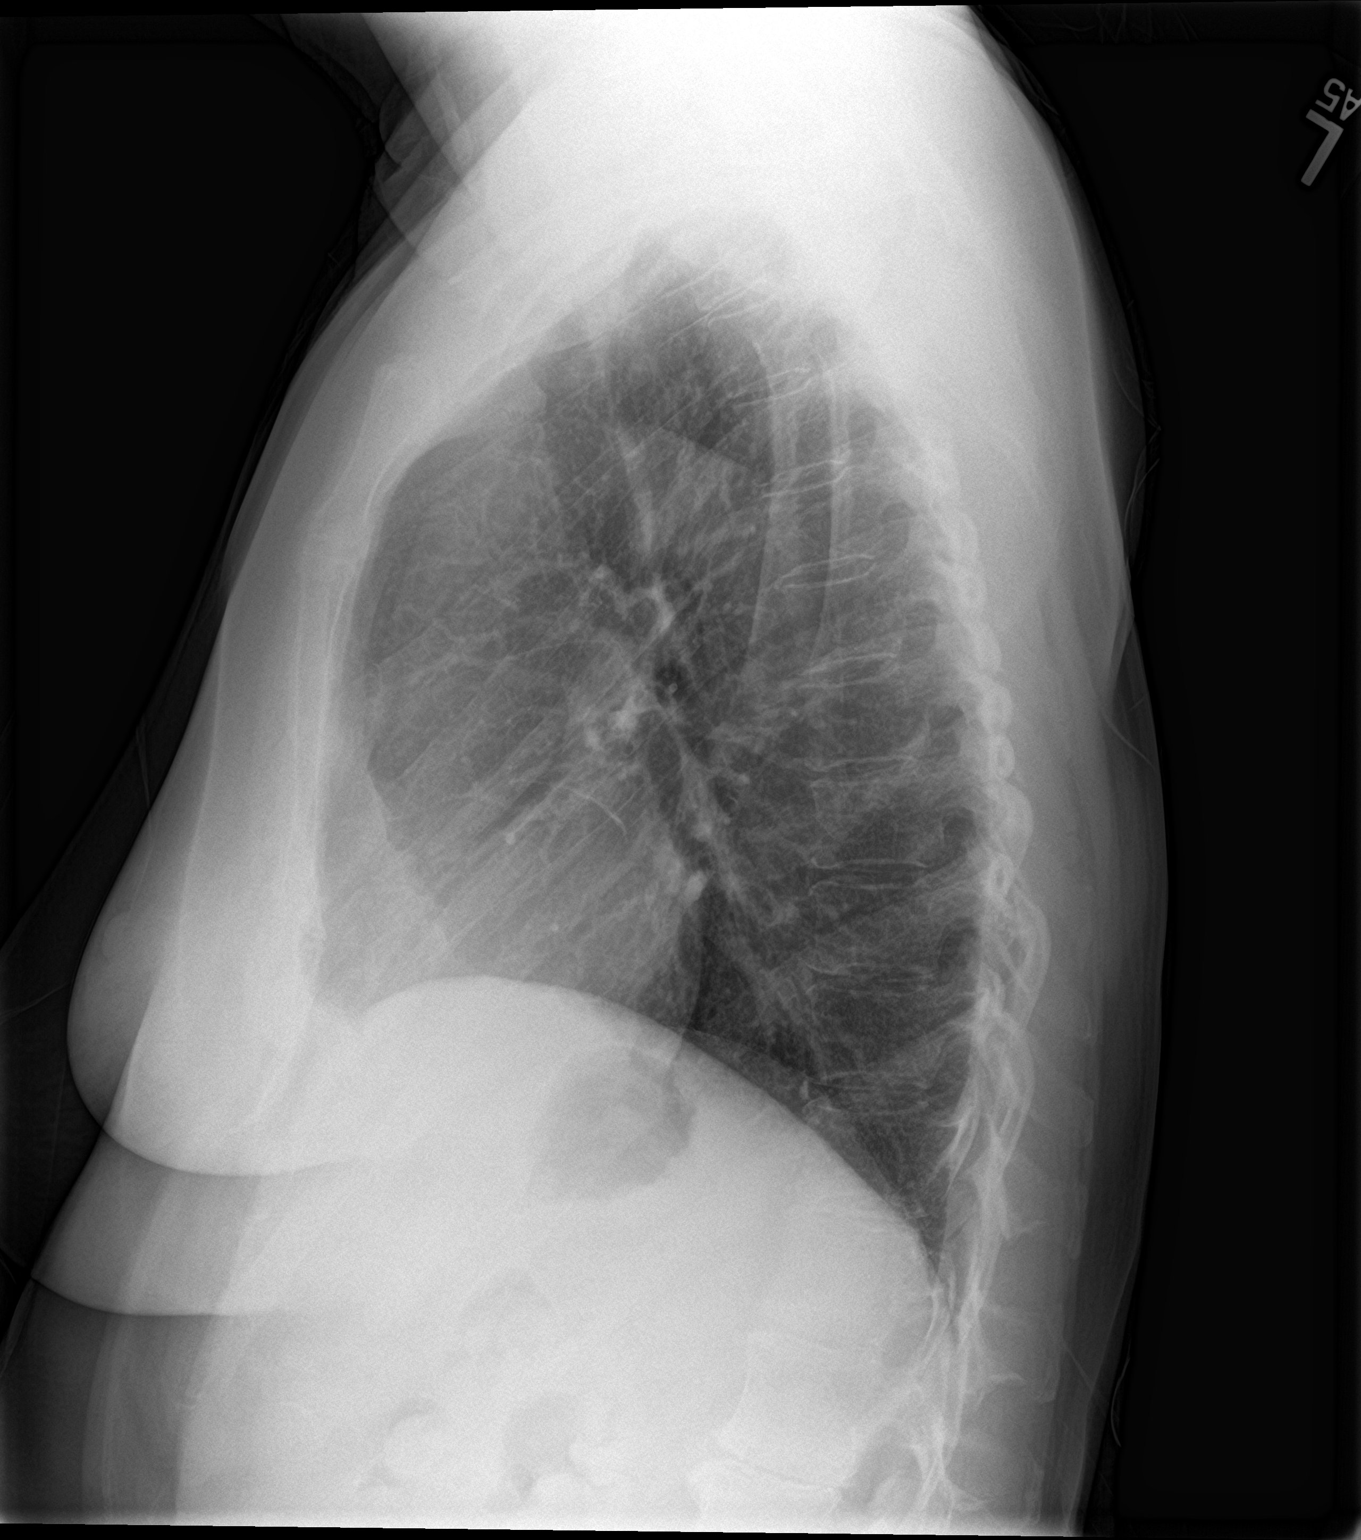

[2 of 2 positions shown; findings below may reference images not displayed]

FINDINGS: Heart size is normal. Mediastinal shadows are normal. The lungs are
clear. Mild indistinctness of the cardiac apex attributed to
epicardial fat. No bronchial thickening. No infiltrate, mass,
effusion or collapse. Pulmonary vascularity is normal. No bony
abnormality.
IMPRESSION: Normal chest

## 2019-03-15 ENCOUNTER — Other Ambulatory Visit: Payer: Self-pay | Admitting: Family Medicine

## 2019-03-17 NOTE — Telephone Encounter (Signed)
Ok times one, needs six mo chronic prob ck up

## 2019-03-18 NOTE — Telephone Encounter (Signed)
Please schedule and then route back to nurses. thanks

## 2019-03-19 ENCOUNTER — Encounter: Payer: Self-pay | Admitting: Family Medicine

## 2019-03-19 ENCOUNTER — Telehealth: Payer: Self-pay | Admitting: Family Medicine

## 2019-03-19 ENCOUNTER — Ambulatory Visit (INDEPENDENT_AMBULATORY_CARE_PROVIDER_SITE_OTHER): Payer: Commercial Managed Care - PPO | Admitting: Family Medicine

## 2019-03-19 ENCOUNTER — Other Ambulatory Visit: Payer: Self-pay | Admitting: *Deleted

## 2019-03-19 ENCOUNTER — Other Ambulatory Visit: Payer: Self-pay

## 2019-03-19 DIAGNOSIS — Z20822 Contact with and (suspected) exposure to covid-19: Secondary | ICD-10-CM

## 2019-03-19 DIAGNOSIS — J310 Chronic rhinitis: Secondary | ICD-10-CM

## 2019-03-19 DIAGNOSIS — J329 Chronic sinusitis, unspecified: Secondary | ICD-10-CM

## 2019-03-19 DIAGNOSIS — Z20828 Contact with and (suspected) exposure to other viral communicable diseases: Secondary | ICD-10-CM

## 2019-03-19 DIAGNOSIS — U071 COVID-19: Secondary | ICD-10-CM | POA: Diagnosis not present

## 2019-03-19 MED ORDER — CEFPROZIL 500 MG PO TABS: ORAL_TABLET | ORAL | 0 refills | Status: DC

## 2019-03-19 MED ORDER — SIMVASTATIN 40 MG PO TABS: ORAL_TABLET | ORAL | 0 refills | Status: DC

## 2019-03-19 NOTE — Telephone Encounter (Signed)
simvastatin (ZOCOR) 40 MG tablet    [facility] [address]

## 2019-03-19 NOTE — Progress Notes (Signed)
   Subjective:  Audio  Patient ID: Krystal Silva, female    DOB: [birth year], 63 y.o.   MRN: [id number]  Cough Episode onset: Sunday. Associated symptoms comments: Cough, congestion, ear pain, headaches,low grade fever. 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 Treatments tried: Benadryl and Advil. The treatment provided mild relief.   Virtual Visit via Telephone Note  I connected with Krystal Silva [facility] on 03/19/19 at  8:30 AM EDT by telephone and verified that I am speaking with the correct person using two identifiers.  Location: Patient: home Provider: office   I discussed the limitations, risks, security and privacy concerns of performing an evaluation and management service by telephone and the availability of in person appointments. I also discussed with the patient that there may be a patient responsible charge related to this service. The patient expressed understanding and agreed to proceed.   History of Present Illness:    Observations/Objective:   Assessment and Plan:   Follow Up Instructions:    I discussed the assessment and treatment plan with the patient. The patient was provided an opportunity to ask questions and all were answered. The patient agreed with the plan and demonstrated an understanding of the instructions.   The patient was advised to call back or seek an in-person evaluation if the symptoms worsen or if the condition fails to improve as anticipated.  I provided 18 minutes of non-face-to-face time during this encounter.   Vicente Males, LPN  So the patient's headache is reminiscent of sinus infection frontal congestion tightness.  Also sore throat.  Also cough.  There has been substantial exposure to someone else who may have been around a COVID-19 patient.  Unfortunately patient has MS and is on medication for this  Review of Systems  Respiratory: Positive for cough.        Objective:   Physical Exam  Virtual      Assessment &  Plan:  Impression respiratory infection with some features concerning for COVID-19.  Will cover with antibiotic for potential sinus element.  Await COVID-19 thoughts.  Warning signs discussed

## 2019-03-19 NOTE — Telephone Encounter (Signed)
Patient just had a virtual appt and said he went over all her medicines

## 2019-03-19 NOTE — Telephone Encounter (Signed)
Prescription sent electronically to pharmacy. Patient notified. 

## 2019-03-19 NOTE — Telephone Encounter (Signed)
Three mo 

## 2019-03-20 ENCOUNTER — Encounter: Payer: Self-pay | Admitting: Family Medicine

## 2019-03-20 LAB — NOVEL CORONAVIRUS, NAA: SARS-CoV-2, NAA: DETECTED — AB

## 2019-03-21 ENCOUNTER — Ambulatory Visit (INDEPENDENT_AMBULATORY_CARE_PROVIDER_SITE_OTHER): Payer: Commercial Managed Care - PPO | Admitting: Family Medicine

## 2019-03-21 ENCOUNTER — Encounter: Payer: Self-pay | Admitting: Family Medicine

## 2019-03-21 ENCOUNTER — Telehealth: Payer: Self-pay | Admitting: Neurology

## 2019-03-21 ENCOUNTER — Other Ambulatory Visit: Payer: Self-pay

## 2019-03-21 ENCOUNTER — Telehealth: Payer: Self-pay | Admitting: Family Medicine

## 2019-03-21 DIAGNOSIS — U071 COVID-19: Secondary | ICD-10-CM

## 2019-03-21 NOTE — Telephone Encounter (Signed)
Dr Felecia Shelling This is a mutual patient She currently has Covid.  She is on day 5.  She is not experiencing fever but does have head congestion drainage and sore throat.  She is concerned regarding whether or not she needs to take her injection this coming Saturday. Please let us know should this patient hold her injection this Saturday given that she is battling Covid. Please also have one of your staff reach out to her as well. Thank you for your input Sallee Lange MD Family medicine

## 2019-03-21 NOTE — Telephone Encounter (Signed)
Pt is calling in requesting a call back , pt wouldn't disclose any information with me.

## 2019-03-21 NOTE — Telephone Encounter (Signed)
Krystal Silva,  She just contacted our office.  We let her know that she should continue to take the Avonex injections.  Actually, interferons are being tested in some clinical trials and may be associated with a mildly better outcome with Covid.  There is a large database accumulating for MS COVID-19 patients and overall, they are doing similar to other patients the same age and comorbid status.  Take care  [name]

## 2019-03-21 NOTE — Progress Notes (Signed)
   Subjective:    Patient ID: Krystal Silva, female    DOB: 1955-06-23, 63 y.o.   MRN: [id number]  HPIcough started 5 days ago and on antibiotic. Virtual visit 2 days ago and covid test came back today positive.  Patient has Covid She denies any shortness of breath She is having a little bit of coughing and little bit of congestion denies wheezing difficulty breathing No vomiting or diarrhea. No sweats She does have significant fatigue and tiredness Her symptoms started this past weekend 10 days would be through this coming Wednesday  Virtual Visit via Telephone Note  I connected with Krystal Silva on 03/21/19 at  3:50 PM EDT by telephone and verified that I am speaking with the correct person using two identifiers.  Location: Patient: home Provider: office   I discussed the limitations, risks, security and privacy concerns of performing an evaluation and management service by telephone and the availability of in person appointments. I also discussed with the patient that there may be a patient responsible charge related to this service. The patient expressed understanding and agreed to proceed.   History of Present Illness:    Observations/Objective:   Assessment and Plan:   Follow Up Instructions:    I discussed the assessment and treatment plan with the patient. The patient was provided an opportunity to ask questions and all were answered. The patient agreed with the plan and demonstrated an understanding of the instructions.   The patient was advised to call back or seek an in-person evaluation if the symptoms worsen or if the condition fails to improve as anticipated.  I provided 15 minutes of non-face-to-face time during this encounter.      Review of Systems  Constitutional: Positive for fatigue. Negative for activity change, chills and fever.  HENT: Positive for congestion and rhinorrhea. Negative for ear pain.   Eyes: Negative for discharge.  Respiratory:  Positive for cough. Negative for shortness of breath and wheezing.   Cardiovascular: Negative for chest pain.       Objective:   Physical Exam   Today's visit was via telephone Physical exam was not possible for this visit      Assessment & Plan:  Covid infection Warning signs were discussed If shortness of breath progressive symptoms are worse immediately ER or if having mild symptoms notify us for follow-up visit as well  Because of positive test and symptomatology patient should not work through next Wednesday May return to work next Thursday we will send her a letter

## 2019-03-21 NOTE — Telephone Encounter (Signed)
I called pt about her telephone call. Pt stated she tested positive for the COVID 19. She wanted to know should she continue the avonex injection weekly for MS. I stated per Dr.Sater continue her MS injection as prescribed. PT verbalized understanding.

## 2019-03-25 NOTE — Telephone Encounter (Signed)
I did communicate with the patient over the weekend She has so far doing well

## 2019-03-27 ENCOUNTER — Other Ambulatory Visit: Payer: Self-pay

## 2019-03-27 ENCOUNTER — Ambulatory Visit (INDEPENDENT_AMBULATORY_CARE_PROVIDER_SITE_OTHER): Payer: Commercial Managed Care - PPO | Admitting: Family Medicine

## 2019-03-27 DIAGNOSIS — J310 Chronic rhinitis: Secondary | ICD-10-CM

## 2019-03-27 DIAGNOSIS — J329 Chronic sinusitis, unspecified: Secondary | ICD-10-CM

## 2019-03-27 DIAGNOSIS — U071 COVID-19: Secondary | ICD-10-CM | POA: Diagnosis not present

## 2019-03-27 MED ORDER — AZITHROMYCIN 250 MG PO TABS: ORAL_TABLET | ORAL | 0 refills | Status: DC

## 2019-03-27 NOTE — Progress Notes (Signed)
   Subjective:    Patient ID: Krystal Silva, female    DOB: Dec 21, 1955, 63 y.o.   MRN: [id number]  Sinusitis This is a new problem. Episode onset: one week. Associated symptoms include congestion and ear pain. (Weakness. Tested positive for covid last week and feels better but still having some symptoms. Does not feel ready to go back to work tomorrow. Would like to extend note til monday)   Had to stop taking cefzil for ear due to the amount of diarrhea she had from it.   Virtual Visit via Telephone Note  I connected with Adaleena Maring [facility] on 03/27/19 at  1:10 PM EST by telephone and verified that I am speaking with the correct person using two identifiers.  Location: Patient: home Provider: office   I discussed the limitations, risks, security and privacy concerns of performing an evaluation and management service by telephone and the availability of in person appointments. I also discussed with the patient that there may be a patient responsible charge related to this service. The patient expressed understanding and agreed to proceed.   History of Present Illness:    Observations/Objective:   Assessment and Plan:   Follow Up Instructions:    I discussed the assessment and treatment plan with the patient. The patient was provided an opportunity to ask questions and all were answered. The patient agreed with the plan and demonstrated an understanding of the instructions.   The patient was advised to call back or seek an in-person evaluation if the symptoms worsen or if the condition fails to improve as anticipated.  I provided 18 minutes of non-face-to-face time during this encounter.  Patient notes ongoing persistent fatigue with the COVID-19 infection.  Also substantial sinus congestion.  In addition congestion has moved into 1 years.  Some pressure and discomfort.  Took Cefzil.  But then developed diarrhea secondary to   Review of Systems  HENT: Positive for congestion and  ear pain.        Objective:   Physical Exam    Virtual    Assessment & Plan:  Impression COVID-19.  Discussed.  Also secondary ear infection.  Cefzil due to diarrhea.  Stop Cefzil.  Start Z-Pak rationale discussed symptom care discussed

## 2019-03-31 ENCOUNTER — Encounter: Payer: Self-pay | Admitting: Family Medicine

## 2019-04-01 ENCOUNTER — Encounter: Payer: Self-pay | Admitting: *Deleted

## 2019-04-02 ENCOUNTER — Encounter: Payer: Self-pay | Admitting: Family Medicine

## 2019-04-02 ENCOUNTER — Telehealth: Payer: Self-pay | Admitting: Family Medicine

## 2019-04-02 NOTE — Telephone Encounter (Signed)
Pt would like a work note sent through [employer] stating she can return to work on the 13th.   Also she finished the antibiotic for her ears and they are better but not completely cleared up.   [id number] - [place], [address]

## 2019-04-02 NOTE — Telephone Encounter (Signed)
Ears are better but sound like she is in a barrell. Finished zpack Sunday. Also see message below for work note

## 2019-04-02 NOTE — Telephone Encounter (Signed)
The "barrell sensation" implies persistent fluid and often can linger weeks after bacterial cure. Also zith lasts in blood addtnl five days after the five d p o dose so still covered. May write the w e for mychart, document diarrhea from cefzil

## 2019-04-02 NOTE — Telephone Encounter (Signed)
Work note has been sent through [employer]

## 2019-04-02 NOTE — Telephone Encounter (Signed)
Pt contacted and verbalized understanding.  

## 2019-04-30 ENCOUNTER — Encounter: Payer: Self-pay | Admitting: *Deleted

## 2019-05-02 ENCOUNTER — Other Ambulatory Visit: Payer: Self-pay

## 2019-05-02 ENCOUNTER — Encounter: Payer: Self-pay | Admitting: Neurology

## 2019-05-02 ENCOUNTER — Ambulatory Visit (INDEPENDENT_AMBULATORY_CARE_PROVIDER_SITE_OTHER): Payer: Commercial Managed Care - PPO | Admitting: Neurology

## 2019-05-02 VITALS — BP 138/80 | HR 86 | Temp 97.7°F | Ht 64.0 in | Wt 171.5 lb

## 2019-05-02 DIAGNOSIS — R269 Unspecified abnormalities of gait and mobility: Secondary | ICD-10-CM | POA: Diagnosis not present

## 2019-05-02 DIAGNOSIS — Z8616 Personal history of COVID-19: Secondary | ICD-10-CM | POA: Insufficient documentation

## 2019-05-02 DIAGNOSIS — R208 Other disturbances of skin sensation: Secondary | ICD-10-CM

## 2019-05-02 DIAGNOSIS — Z8619 Personal history of other infectious and parasitic diseases: Secondary | ICD-10-CM | POA: Diagnosis not present

## 2019-05-02 DIAGNOSIS — G35 Multiple sclerosis: Secondary | ICD-10-CM

## 2019-05-02 NOTE — Progress Notes (Signed)
[facility]  PATIENT: Krystal Silva DOB: 12-Aug-1955  REFERRING DOCTOR OR PCP:   Coralie Keens SOURCE: patient, records from initial Neurology group and MRI images on PACS/CD  _________________________________   HISTORICAL  CHIEF COMPLAINT:  Chief Complaint  Patient presents with  . Follow-up    RM 12, alone. Last seen 10/31/2018. Still having bilateral knee pain. She tripped and fell over object last weekend but denies hitting head or significant injury.   . Multiple Sclerosis    On Avonex.     HISTORY OF PRESENT ILLNESS:  Krystal Silva is a 63 y.o.  woman with relapsing remitting MS diagnosed in 2006.  Update 05/02/2019  She is on Avonex and tolerates it ok systemically but has some knots under her skin and in muscles of her thighs.     We dicussed options but, as she is stable MS-wise, she'd like to stay on Avonex.      Her gait is unchanged.   Knees hurt and affect gait more than balance.    Legs are strong.   No new sensory changes.   She has fatigue but is better than last month.    Mood is worse this years with everything going on.  She is on sertraline and imipramine.      She had Covid October (PCR verified).   She felt very fatigued and had hypersomnia for a week.   She had headache x 2 weeks and mild URI symptoms.   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  Update 10/31/2018 (virtual): She feels her MS has been stable.  She is on Avonex. No exacerbations and 01/24/2018 MRI showed no new lesions.    She get injection site pain and is having more difficulty finding good injection sites.  In the past, we had discussed oral agents she has been reluctant to make a switch.   Her gait is off but she feels the knees play a larger role than the MS.   She has done PT but no lasting benefit. Acupuncture would help 1-2 days.    She has dysesthetic pain in her legs.  This  pain has been improved by imipramine but she still wakes up with pain there.   She finds she needs to move around some nights to get comfortable.   She denies any major problems with her bladder function.  Vision is good.    She has physical fatigue that is worse with heat.  She has sleep maintenance insomnia and is better with imipramine.   She has had some depression but feels mood is generally good with the sertraline.  She notes no major problems with cognition.    ______  Update 08/30/2017: She feels her MS has been stable.  She is on Avonex and generally tolerates it well though she will sometimes get injection site pain and is having more difficulty finding good injection sites.  In the past, we had discussed oral agents she has been reluctant to make a switch.  She notes mild gait disturbance. .  Knee issues also affect her gait but knee pain is better with acupuncture.   She also has leg dysesthetic pain.  This pain has been improved by imipramine but she still wakes up with pain there.   She finds she needs to move around some nights to get comfortable.  .  She denies any major problems with her bladder function.  Vision is  good.    She continues to experience a lot of physical fatigue that is worse with heat.  She sleeps well most nights and her sleep maintenance insomnia was improved with imipramine but she still has RLS.   She has never been on gabapentin..  She has had some depression but feels mood is generally good with the sertraline.  She notes no major problems with cognition.     I reviewed recent labs:   Normal LFT and WBC.   Slightly increased HgBA1c=5.7   Update 02/22/2017:    She is on Avonex and denies any exacerbation.    She finds it hard to find less painful injection sites and is starting to get tired of giving herself shots.   However, because her MS is stable, she is reluctant to change.    Her gait is off, possibly due to right knee arthritis.   She has some leg  dysesthestic pain, better with imipramine.    Bladder function is well. Vision is doing well.  She has a lot of fatigue and this is worse with heat.     She sleeps better with imipramine.   Mood is better with sertraline and imipramine.   Cognition is doing well.     She gets a lot of allergies in the fall and that makes her sleepier.   She takes Benadryl at bedtime.  Sometimes she takes a daytime Zyrtec.[name]   Her right knee is still bothering her. She thinks she will need to have a total knee replacement soon.  She takes vit D daily.   The last level (08/2016) was 43.7.     __________________________ From 08/18/2016  MS:   She is on Avonex with some tolerability issues with mild flulike reactions at 1 day after he each injection.   She also has noted some lumps under her skin from the injections, especially in the thighs.. She denies any recent exacerbation.    Gait/strength/sensation: She notes some difficulty with her gait but this is mostly due to her right knee. However, she does note that her balance is a little bit off at times.   She stumbles but no falls.   She denies any significant weakness in the arms or legs. She has dysesthetic pain in both lower legs.     We tried imipramine and gabapentin but they were poorly tolerated.      She never filled the lamotrigine as she was worried about side effects.  Bladder: She denies any significant problem with urinary function. Specifically, she does not have nocturia, frequency or hesitancy.  Vision: She denies any current MS related vision problem.  The diplopia she had in 2003 resolved.  She denies diplopia when tired or hot.  She has never had optic neuritis.  Fatigue/sleep: She feels more tired the past year than usual.   Fatigue is both cognitive and physical.  She feels she sleeps well at night.    She does not snore.     Mood/cognition:  She feels mood is a little worse and she is sometimes mildly depressed.     She is on sertraline and  tolerates it well.      She denies any significant difficulty with cognition. She has not noted problems with her memory.   She works full time.   Hearing: She wears hearing aids bilaterally.  She has a little bit of tinnitus   She has a family history of deafness.  Knee:   She had recent  surgery for knee pain and instability and does not think the surgery helped so a TKR is being considered.     MS History:  In 2003, she had an episode of diplopia. An MRI was performed but she was not diagnosed with MS at that time. In 2006, in [place],  she presented with dysesthetic sensations in the legs, mostly around the shin. She had MRI's performed that were more consistent with MS. She also had a lumbar puncture and the CSF was consistent with MS. The CSF showed oligoclonal bands and IgG index of 1.37 (elevated). She started to see Dr. George Hugh and then Dr. Shelia Media in 2010/2011 after moving to the area.    An MRI performed in 2011 was reportedly very similar to the one from 2006. She has been on Avonex therapy since diagnosis.[name]  REVIEW OF SYSTEMS: Constitutional: No fevers, chills, sweats, or change in appetite Eyes: No visual changes, double vision, eye pain Ear, nose and throat: No hearing loss, ear pain, nasal congestion, sore throat Cardiovascular: No chest pain, palpitations Respiratory: No shortness of breath at rest or with exertion.   No wheezes GastrointestinaI: No nausea, vomiting, diarrhea, abdominal pain, fecal incontinence Genitourinary: No dysuria, urinary retention or frequency.  No nocturia. Musculoskeletal: No neck pain, back pain Integumentary: No rash, pruritus, skin lesions Neurological: as above Psychiatric: No depression at this time.  No anxiety Endocrine: No palpitations, diaphoresis, change in appetite, change in weigh or increased thirst Hematologic/Lymphatic: No anemia, purpura, petechiae. Allergic/Immunologic: No itchy/runny eyes, nasal congestion, recent allergic  reactions, rashes  ALLERGIES: Allergies  Allergen Reactions  . Cefzil [Cefprozil] Diarrhea  . Codeine   . Penicillins     REACTION: rash  . Sulfonamide Derivatives   . Sulfamethoxazole Rash    HOME MEDICATIONS:  Current Outpatient Medications:  .  azithromycin (ZITHROMAX Z-PAK) 250 MG tablet, Take 2 tablets (500 mg) on  Day 1,  followed by 1 tablet (250 mg) once daily on Days 2 through 5., Disp: 6 each, Rfl: 0 .  baclofen (LIORESAL) 10 MG tablet, Take one tablet 3 times daily as needed., Disp: 270 tablet, Rfl: 3 .  Cholecalciferol (VITAMIN D-3) 5000 UNITS TABS, Take by mouth daily., Disp: , Rfl:  .  ibuprofen (ADVIL,MOTRIN) 800 MG tablet, TAKE 1 TABLET BY MOUTH 3 TIMES A DAY AS NEEDED FOR PAIN, Disp: 90 tablet, Rfl: 1 .  imipramine (TOFRANIL) 25 MG tablet, One to two po qHS, Disp: 180 tablet, Rfl: 3 .  Interferon Beta-1a (AVONEX PEN) 30 MCG/0.5ML AJKT, Inject 30 mcg into the muscle once a week., Disp: 12 each, Rfl: 3 .  KLOR-CON M10 10 MEQ tablet, TAKE 1 TABLET BY MOUTH EVERY DAY, Disp: 90 tablet, Rfl: 0 .  LACTOBACILLUS RHAMNOSUS, GG, PO, Take 4 capsules by mouth daily. , Disp: , Rfl:  .  losartan-hydrochlorothiazide (HYZAAR) 100-12.5 MG tablet, TAKE 1 TABLET BY MOUTH EVERY DAY FOR BLOOD PRESSURE, Disp: 90 tablet, Rfl: 1 .  Multiple Vitamin (MULTIVITAMIN) tablet, Take 1 tablet by mouth daily., Disp: , Rfl:  .  NAPROXEN PO, Take by mouth as needed. , Disp: , Rfl:  .  Probiotic Product (PROBIOTIC DAILY PO), Take by mouth., Disp: , Rfl:  .  sertraline (ZOLOFT) 100 MG tablet, Take 1 tablet (100 mg total) by mouth daily., Disp: 90 tablet, Rfl: 1 .  simvastatin (ZOCOR) 40 MG tablet, TAKE 1 TABLET BY MOUTH EVERYDAY AT BEDTIME, Disp: 90 tablet, Rfl: 0 .  vitamin E 400 UNIT capsule, Take 800 Units by  mouth daily., Disp: , Rfl:   PAST MEDICAL HISTORY: Past Medical History:  Diagnosis Date  . Allergy   . Diverticulitis   . Hypercholesteremia   . Hypertension   . Insomnia   . MS  (multiple sclerosis) ([place])   . Vitamin D deficiency     PAST SURGICAL HISTORY: Past Surgical History:  Procedure Laterality Date  . COLONOSCOPY  08/21/2009  . KNEE ARTHROSCOPY WITH MEDIAL MENISECTOMY Right 03/27/2015   Procedure: KNEE ARTHROSCOPY WITH PARTIAL MEDIAL MENISECTOMY AND MICROFRACTURE;  Surgeon: Carole Civil, MD;  Location: [facility];  Service: Orthopedics;  Laterality: Right;  . TONSILLECTOMY    . WISDOM TOOTH EXTRACTION  1980's    FAMILY HISTORY: Family History  Problem Relation Age of Onset  . Hypertension Mother   . Hyperlipidemia Father   . Hearing loss Father   . Hyperlipidemia Brother   . Diabetes Brother   . Vision loss Maternal Grandmother   . Vision loss Paternal Grandmother   . Cancer Other        breast    SOCIAL HISTORY:  Social History   Socioeconomic History  . Marital status: Divorced    Spouse name: Not on file  . Number of children: Not on file  . Years of education: 34  . Highest education level: Not on file  Occupational History  . Occupation: work   Tobacco Use  . Smoking status: Former Smoker    Packs/day: 1.50    Years: 30.00    Pack years: 45.00    Types: Cigarettes    Start date: 12/26/1974    Quit date: 06/22/2004    Years since quitting: 14.8  . Smokeless tobacco: Never Used  Substance and Sexual Activity  . Alcohol use: Yes    Alcohol/week: 1.0 standard drinks    Types: 1 Cans of beer per week    Comment: rare social  . Drug use: No  . Sexual activity: Not Currently  Other Topics Concern  . Not on file  Social History Narrative   Coffee in the morning couple of cups    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[employer]   Social Determinants of Health   Financial Resource Strain:   . Difficulty of Paying Living Expenses: Not on file  Food Insecurity:   . Worried About Charity fundraiser in the Last Year: Not on file  . Ran Out of Food in the Last Year: Not on file  Transportation  Needs:   . Lack of Transportation (Medical): Not on file  . Lack of Transportation (Non-Medical): Not on file  Physical Activity:   . Days of Exercise per Week: Not on file  . Minutes of Exercise per Session: Not on file  Stress:   . Feeling of Stress : Not on file  Social Connections:   . Frequency of Communication with Friends and Family: Not on file  . Frequency of Social Gatherings with Friends and Family: Not on file  . Attends Religious Services: Not on file  . Active Member of Clubs or Organizations: Not on file  . Attends Archivist Meetings: Not on file  . Marital Status: Not on file  Intimate Partner Violence:   . Fear of Current or Ex-Partner: Not on file  . Emotionally Abused: Not on file  . Physically Abused: Not on file  . Sexually Abused: Not on file     PHYSICAL EXAM  Vitals:   05/02/19  1057  BP: 138/80  Pulse: 86  Temp: 97.7 F (36.5 C)  SpO2: 98%  Weight: 171 lb 8 oz (77.8 kg)  Height: 5\' 4"  (1.626 m)    Body mass index is 29.44 kg/m.   General: The patient is well-developed and well-nourished and in no acute distress  Neurologic Exam  Mental status: The patient is alert and oriented x 3 at the time of the examination. The patient has apparent normal recent and remote memory, with an apparently normal attention span and concentration ability.   Speech is normal.  Cranial nerves: Extraocular movements are full.  Her facial strength and sensation is normal.  The trapezius strength is normal.  Hearing is normal and symmetric.  Motor:  Muscle bulk is normal.   Tone is mildly increased in legs. Strength is  5 / 5 in all 4 extremities except 4+/5 in right EHL.   Sensory: Sensory testing intact to touch and vibration in the arms and legs.  Coordination: She has good finger-nose-finger and heel-to-shin bilaterally.  Gait and station: Station is normal.  The gait has a mildly reduced stride but mostly due to right knee pain.  The tandem gait is  mildly wide.  Romberg is negative.   Reflexes: Deep tendon reflexes are symmetric and increased bilaterally with  2 beats non-sustained clonus at ankles.       Multiple sclerosis (HCC)  Dysesthesia  Gait disturbance  History of 2019 novel coronavirus disease (COVID-19)   1.   Continue Avonex.  We discussed options but she would prefer to stay on the injection for the time being.  We also discussed that we will sometimes consider stopping the medication if the patient has had MS for 20 or more years and this past age 41.  She will be at this point in a few more years. 2.   Continue baclofen for spasticity and sertraline and imipramine for mood 3.   Return in 6 months or sooner if there are new or worsening neurologic symptoms.  Mathieu Schloemer A. Felecia Shelling, MD, PhD [id number], [id number] AM Certified in Neurology, Clinical Neurophysiology, Sleep Medicine, Pain Medicine and Neuroimaging  [facility] [address], [place], [place] [address] [phone]

## 2019-05-11 ENCOUNTER — Other Ambulatory Visit: Payer: Self-pay | Admitting: Family Medicine

## 2019-05-15 ENCOUNTER — Telehealth: Payer: Self-pay | Admitting: Family Medicine

## 2019-05-15 ENCOUNTER — Other Ambulatory Visit: Payer: Self-pay | Admitting: Family Medicine

## 2019-05-15 MED ORDER — SERTRALINE HCL 100 MG PO TABS: 100.0000 mg | ORAL_TABLET | Freq: Every day | ORAL | 0 refills | Status: DC

## 2019-05-15 MED ORDER — SIMVASTATIN 40 MG PO TABS: ORAL_TABLET | ORAL | 0 refills | Status: DC

## 2019-05-15 MED ORDER — LOSARTAN POTASSIUM-HCTZ 100-12.5 MG PO TABS: ORAL_TABLET | ORAL | 0 refills | Status: DC

## 2019-05-15 NOTE — Telephone Encounter (Signed)
She may have a refill on those as well if there are any additional chronic medications that she needs a 1 month medicine refill on that would be fine in for her to keep her follow-up appointment thank you

## 2019-05-15 NOTE — Telephone Encounter (Signed)
Patent needing refill on sertraline 100 mg [facility] [address] [facility]

## 2019-05-15 NOTE — Telephone Encounter (Signed)
Last med check April 2020

## 2019-05-15 NOTE — Telephone Encounter (Signed)
Med refills sent in and pt informed

## 2019-05-15 NOTE — Telephone Encounter (Signed)
Patient has appointment on 1/14 for medication followup.She states completely out.

## 2019-05-15 NOTE — Telephone Encounter (Signed)
May have 30 tablets sent in please keep follow-up visit in January

## 2019-05-15 NOTE — Telephone Encounter (Signed)
Pt contacted and informed that 30 tablets were sent over. Pt states she will also need refill on losartan-HCTZ 100-12.5 mg and [facility] Please advise. Thank you

## 2019-05-20 ENCOUNTER — Other Ambulatory Visit: Payer: Self-pay | Admitting: Neurology

## 2019-06-06 ENCOUNTER — Other Ambulatory Visit: Payer: Self-pay | Admitting: Family Medicine

## 2019-06-06 ENCOUNTER — Ambulatory Visit (INDEPENDENT_AMBULATORY_CARE_PROVIDER_SITE_OTHER): Payer: Commercial Managed Care - PPO | Admitting: Family Medicine

## 2019-06-06 DIAGNOSIS — E781 Pure hyperglyceridemia: Secondary | ICD-10-CM

## 2019-06-06 DIAGNOSIS — Z1329 Encounter for screening for other suspected endocrine disorder: Secondary | ICD-10-CM

## 2019-06-06 DIAGNOSIS — U071 COVID-19: Secondary | ICD-10-CM

## 2019-06-06 DIAGNOSIS — I10 Essential (primary) hypertension: Secondary | ICD-10-CM | POA: Diagnosis not present

## 2019-06-06 DIAGNOSIS — F418 Other specified anxiety disorders: Secondary | ICD-10-CM | POA: Diagnosis not present

## 2019-06-06 DIAGNOSIS — Z79899 Other long term (current) drug therapy: Secondary | ICD-10-CM

## 2019-06-06 MED ORDER — POTASSIUM CHLORIDE CRYS ER 10 MEQ PO TBCR: 10.0000 meq | EXTENDED_RELEASE_TABLET | Freq: Every day | ORAL | 1 refills | Status: DC

## 2019-06-06 MED ORDER — LOSARTAN POTASSIUM-HCTZ 100-12.5 MG PO TABS: ORAL_TABLET | ORAL | 1 refills | Status: DC

## 2019-06-06 MED ORDER — SERTRALINE HCL 100 MG PO TABS: 100.0000 mg | ORAL_TABLET | Freq: Every day | ORAL | 1 refills | Status: DC

## 2019-06-06 MED ORDER — SIMVASTATIN 40 MG PO TABS: ORAL_TABLET | ORAL | 1 refills | Status: DC

## 2019-06-06 NOTE — Telephone Encounter (Signed)
Last med check up 09/17/18

## 2019-06-06 NOTE — Addendum Note (Signed)
Addended by: Vicente Males on: 06/06/2019 10:47 AM   Modules accepted: Orders

## 2019-06-06 NOTE — Progress Notes (Signed)
   Subjective:  Audio  Patient ID: Krystal Silva, female    DOB: 09/07/1955, 64 y.o.   MRN: [id number]  HPI Pt is needing 90 day supply on chronic medications. Pt states that she is doing well, no issues or concerns.  Virtual Visit via Telephone Note  I connected with Telma Christ [facility] on 06/06/19 at 10:00 AM EST by telephone and verified that I am speaking with the correct person using two identifiers.  Location: Patient: home Provider: office   I discussed the limitations, risks, security and privacy concerns of performing an evaluation and management service by telephone and the availability of in person appointments. I also discussed with the patient that there may be a patient responsible charge related to this service. The patient expressed understanding and agreed to proceed.   History of Present Illness:    Observations/Objective:   Assessment and Plan:   Follow Up Instructions:    I discussed the assessment and treatment plan with the patient. The patient was provided an opportunity to ask questions and all were answered. The patient agreed with the plan and demonstrated an understanding of the instructions.   The patient was advised to call back or seek an in-person evaluation if the symptoms worsen or if the condition fails to improve as anticipated.  I provided 22 minutes of non-face-to-face time during this encounter.  Blood pressure medicine and blood pressure levels reviewed today with patient. Compliant with blood pressure medicine. States does not miss a dose. No obvious side effects. Blood pressure generally good when checked elsewhere. Watching salt intake.  Patient continues to take lipid medication regularly. No obvious side effects from it. Generally does not miss a dose. Prior blood work results are reviewed with patient. Patient continues to work on fat intake in diet  Patient notes ongoing compliance with antidepressant medication. No obvious side effects.  Reports does not miss a dose. Overall continues to help depression substantially. No thoughts of homicide or suicide. Would like to maintain medication.  118 over 77  Other times higher,     Exercise not the best,  walking     Review of Systems No headache, no major weight loss or weight gain, no chest pain no back pain abdominal pain no change in bowel habits complete ROS otherwise negative     Objective:   Physical Exam  Virtual      Assessment & Plan:  Impression 1 hypertension apparent good control.  Medications refilled compliance discussed  2.  Hyperlipidemia.  Status uncertain.  Time for blood work discussed patient will 2.  3.  History of depression clinically stable compliant meds  4.  Status post COVID-19 infection.  Discussed at length.  Patient has an approximate 6-week brain follow-up after her attack.  Reports this is pretty much resolved  Follow-up in 6 months.  Further recommendations based on blood work.  All medications refilled.  Diet exercise discussed and encouraged

## 2019-06-27 ENCOUNTER — Encounter: Payer: Self-pay | Admitting: Family Medicine

## 2019-07-23 ENCOUNTER — Telehealth: Payer: Self-pay | Admitting: Neurology

## 2019-07-23 NOTE — Telephone Encounter (Signed)
CBC. BMP, Lipid panel, LFT, HgbA1c and vit D were normal  TSH mildly elevated, ferritin mildly elevated.   Iron and B12 were normal

## 2019-08-02 ENCOUNTER — Other Ambulatory Visit: Payer: Self-pay

## 2019-08-02 ENCOUNTER — Encounter: Payer: Self-pay | Admitting: Nurse Practitioner

## 2019-08-02 ENCOUNTER — Ambulatory Visit (INDEPENDENT_AMBULATORY_CARE_PROVIDER_SITE_OTHER): Payer: Commercial Managed Care - PPO | Admitting: Nurse Practitioner

## 2019-08-02 VITALS — BP 114/76 | Temp 97.9°F | Ht 64.0 in | Wt 173.4 lb

## 2019-08-02 DIAGNOSIS — Z01419 Encounter for gynecological examination (general) (routine) without abnormal findings: Secondary | ICD-10-CM

## 2019-08-02 DIAGNOSIS — L578 Other skin changes due to chronic exposure to nonionizing radiation: Secondary | ICD-10-CM

## 2019-08-02 DIAGNOSIS — R7989 Other specified abnormal findings of blood chemistry: Secondary | ICD-10-CM | POA: Diagnosis not present

## 2019-08-02 DIAGNOSIS — Z0000 Encounter for general adult medical examination without abnormal findings: Secondary | ICD-10-CM | POA: Diagnosis not present

## 2019-08-02 NOTE — Progress Notes (Signed)
Subjective:    Patient ID: Krystal Silva, female    DOB: [birth year], 64 y.o.   MRN: [id number]  HPI Patient presents for annual wellness exam today. Reports being current for dental and vision exams. Patient up to date on immunizations. Drinks 1-2 beers per week and stopped smoking in 2006. History of breast cancer in Aunt on maternal side.Patient has previous history of mass in left breast. Patient is in menopause and denies any abnormal bleeding or discharge. Currently, not sexually active. Exercise level has decreased since having bilateral knee problems.Regular diet. No other concerns verbalized at this time.     Review of Systems  HENT: Positive for hearing loss.        Hearing aids bilaterally.   Respiratory: Negative for cough, chest tightness, shortness of breath and wheezing.   Cardiovascular: Negative for chest pain and palpitations.  Gastrointestinal: Negative for abdominal distention, abdominal pain, blood in stool, constipation, diarrhea, nausea and rectal pain.  Genitourinary: Negative for difficulty urinating, dysuria, frequency, genital sores, pelvic pain, urgency, vaginal bleeding, vaginal discharge and vaginal pain.  Neurological: Negative for weakness, light-headedness and headaches.   Depression screen [facility] 2/9 08/02/2019 09/13/2017 09/09/2016  Decreased Interest 0 0 0  Down, Depressed, Hopeless 1 0 0  PHQ - 2 Score 1 0 0  Altered sleeping 0 - -  Tired, decreased energy 0 - -  Change in appetite 0 - -  Feeling bad or failure about yourself  1 - -  Trouble concentrating 0 - -  Moving slowly or fidgety/restless 0 - -  Suicidal thoughts 0 - -  PHQ-9 Score 2 - -  Difficult doing work/chores Somewhat difficult - -       Objective:   Physical Exam Constitutional:      Appearance: Normal appearance.  HENT:     Right Ear: Tympanic membrane and ear canal normal.     Left Ear: Tympanic membrane and ear canal normal.  Neck:     Comments: Thyroid nontender, no mass or  goiter noted.  Cardiovascular:     Rate and Rhythm: Normal rate and regular rhythm.     Heart sounds: No murmur.  Pulmonary:     Effort: Pulmonary effort is normal.     Breath sounds: Normal breath sounds.  Chest:     Breasts:        Right: No swelling, bleeding, inverted nipple, mass, nipple discharge, skin change or tenderness.        Left: Mass present. No swelling, bleeding, inverted nipple, nipple discharge, skin change or tenderness.     Comments: Dense tissue; 2 cm firm, mobile linear mass noted at one o'clock left breast approx 1-2 cm from areola. Axillae no adenopathy. No change from previous exam.  Abdominal:     General: There is no distension.     Palpations: Abdomen is soft.     Tenderness: There is no abdominal tenderness.  Genitourinary:    Labia:        Right: No rash, tenderness, lesion or injury.        Left: No rash, tenderness, lesion or injury.      Vagina: No vaginal discharge, erythema, tenderness or bleeding.     Cervix: No lesion, erythema or cervical bleeding.     Uterus: Not enlarged and not tender.      Adnexa:        Right: No mass, tenderness or fullness.         Left: No mass, tenderness  or fullness.    Lymphadenopathy:     Cervical: No cervical adenopathy.     Upper Body:     Right upper body: No supraclavicular, axillary or pectoral adenopathy.     Left upper body: No supraclavicular, axillary or pectoral adenopathy.  Skin:    General: Skin is warm and dry.     Comments: Changes consistent with sun damage noted.   Neurological:     Mental Status: She is alert.  Psychiatric:        Mood and Affect: Mood normal.        Behavior: Behavior normal.        Thought Content: Thought content normal.        Judgment: Judgment normal.   Patient brought in labs which were reviewed and discussed. See scanned copy.          Assessment & Plan:  Well woman exam  Abnormal thyroid blood test - Plan: TSH  Photoaged skin   TSH is slightly  elevated. Will have patient follow up in 3 months for a repeat TSH. Continue potassium supplement. Switch to multivitamin for women over 50 related to slightly elevated iron level.  Discussed COVID vaccine.  Encouraged activity and healthy diet.  Consider consultation with dermatology for skin cancer screening.  Return in about 1 year (around 08/01/2020) for physical.

## 2019-08-02 NOTE — Progress Notes (Signed)
   Subjective:    Patient ID: Krystal Silva, female    DOB: 28-May-1955, 64 y.o.   MRN: [id number]  HPI The patient comes in today for a wellness visit.    A review of their health history was completed.  A review of medications was also completed.  Any needed refills; none today  Eating habits: health conscious  Falls/  MVA accidents in past few months: none  Regular exercise: dance, walking. Use to do regular exercise but unable due to knees  Specialist pt sees on regular basis: neurologist Dr. Felecia Shelling in [facility]  Preventative health issues were discussed.   Additional concerns: none    Review of Systems     Objective:   Physical Exam        Assessment & Plan:

## 2019-08-03 DIAGNOSIS — L578 Other skin changes due to chronic exposure to nonionizing radiation: Secondary | ICD-10-CM | POA: Insufficient documentation

## 2019-08-05 ENCOUNTER — Encounter: Payer: Self-pay | Admitting: Gastroenterology

## 2019-09-04 ENCOUNTER — Other Ambulatory Visit: Payer: Self-pay

## 2019-09-04 ENCOUNTER — Ambulatory Visit (INDEPENDENT_AMBULATORY_CARE_PROVIDER_SITE_OTHER): Payer: Commercial Managed Care - PPO | Admitting: Dermatology

## 2019-09-04 DIAGNOSIS — D229 Melanocytic nevi, unspecified: Secondary | ICD-10-CM

## 2019-09-04 DIAGNOSIS — D225 Melanocytic nevi of trunk: Secondary | ICD-10-CM

## 2019-09-04 DIAGNOSIS — D485 Neoplasm of uncertain behavior of skin: Secondary | ICD-10-CM

## 2019-09-04 DIAGNOSIS — D492 Neoplasm of unspecified behavior of bone, soft tissue, and skin: Secondary | ICD-10-CM

## 2019-09-04 DIAGNOSIS — C4491 Basal cell carcinoma of skin, unspecified: Secondary | ICD-10-CM

## 2019-09-04 HISTORY — DX: Basal cell carcinoma of skin, unspecified: C44.91

## 2019-09-04 NOTE — Patient Instructions (Signed)

## 2019-09-06 ENCOUNTER — Telehealth: Payer: Self-pay

## 2019-09-06 NOTE — Telephone Encounter (Signed)
Path results to patient. Told her that Dr. Denna Haggard would prefer that she have the [facility] procedure done.  Would prefer Dr. Link Snuffer or Dr. Velia Meyer.  Told her that we will get all her information sent to the [facility].

## 2019-09-06 NOTE — Telephone Encounter (Signed)
-----   Message from Lavonna Monarch, MD sent at 09/06/2019  6:35 AM EDT ----- Primary BCC but Dr.T suspect both a bit deep+wide so prefers MOHs. IF [facility] available in [place]=1st choice; 2nd=[place].

## 2019-09-08 ENCOUNTER — Encounter: Payer: Self-pay | Admitting: Dermatology

## 2019-09-08 NOTE — Progress Notes (Addendum)
   Follow-Up Visit   Subjective  Krystal Silva is a 64 y.o. female who presents for the following: Annual Exam (Concerns are right wrist, low back rough spot and skin tags around neck and under bra area. ).  Growth  Location: Right cheek Duration: Uncertain Quality:  Associated Signs/Symptoms: Modifying Factors:  Severity:  Timing: Context:   The following portions of the chart were reviewed this encounter and updated as appropriate: Tobacco  Allergies  Meds  Problems  Med Hx  Surg Hx  Fam Hx      Objective  Well appearing patient in no apparent distress; mood and affect are within normal limits.  All sun exposed areas plus back examined.   Assessment & Plan  Neoplasm of skin Right Parotid Area  Skin / nail biopsy Type of biopsy: tangential   Informed consent: discussed and consent obtained   Anesthesia: the lesion was anesthetized in a standard fashion   Anesthetic:  1% lidocaine w/ epinephrine 1-100,000 local infiltration Instrument used: flexible razor blade   Hemostasis achieved with: ferric subsulfate   Outcome: patient tolerated procedure well   Post-procedure details: sterile dressing applied and wound care instructions given   Dressing type: petrolatum   Additional details:  Patient identified lesion of concern.  Lesion identified by physician.  Specimen 1 - Surgical pathology Differential Diagnosis: scc vs bcc Check Margins: No  Nevus (2) Right Lower Back; Mid Back  Reassure benign, may see hand surgeon for ganglion cyst.

## 2019-10-22 ENCOUNTER — Other Ambulatory Visit: Payer: Self-pay | Admitting: Neurology

## 2019-10-23 ENCOUNTER — Other Ambulatory Visit (HOSPITAL_COMMUNITY): Payer: Self-pay | Admitting: Family Medicine

## 2019-10-23 DIAGNOSIS — Z1231 Encounter for screening mammogram for malignant neoplasm of breast: Secondary | ICD-10-CM

## 2019-10-30 ENCOUNTER — Ambulatory Visit (HOSPITAL_COMMUNITY)
Admission: RE | Admit: 2019-10-30 | Discharge: 2019-10-30 | Disposition: A | Discharge status: Home / Self Care | Payer: Commercial Managed Care - PPO | Source: Ambulatory Visit | Attending: Family Medicine | Admitting: Family Medicine

## 2019-10-30 ENCOUNTER — Other Ambulatory Visit: Payer: Self-pay

## 2019-10-30 ENCOUNTER — Other Ambulatory Visit (HOSPITAL_COMMUNITY): Payer: Self-pay | Admitting: Nurse Practitioner

## 2019-10-30 DIAGNOSIS — Z1231 Encounter for screening mammogram for malignant neoplasm of breast: Secondary | ICD-10-CM | POA: Insufficient documentation

## 2019-10-31 ENCOUNTER — Ambulatory Visit: Payer: Commercial Managed Care - PPO | Admitting: Neurology

## 2019-11-05 ENCOUNTER — Telehealth: Payer: Self-pay

## 2019-11-05 NOTE — Telephone Encounter (Signed)
Received notice from [facility] that they have made several attempts to contact the pt to fill her avonex. I will send pt a mychart message asking her to call them back.

## 2019-11-11 ENCOUNTER — Telehealth: Payer: Self-pay | Admitting: Family Medicine

## 2019-11-11 NOTE — Telephone Encounter (Signed)
Had labs on 07/17/19 lipid, bmp, cbc. - scanned into chart from another provider

## 2019-11-11 NOTE — Telephone Encounter (Signed)
Patient has 6 month follow on 7/19 will she need labs done

## 2019-11-12 ENCOUNTER — Other Ambulatory Visit: Payer: Self-pay | Admitting: *Deleted

## 2019-11-12 DIAGNOSIS — R5383 Other fatigue: Secondary | ICD-10-CM

## 2019-11-12 DIAGNOSIS — Z1329 Encounter for screening for other suspected endocrine disorder: Secondary | ICD-10-CM

## 2019-11-12 DIAGNOSIS — Z79899 Other long term (current) drug therapy: Secondary | ICD-10-CM

## 2019-11-12 DIAGNOSIS — I10 Essential (primary) hypertension: Secondary | ICD-10-CM

## 2019-11-12 DIAGNOSIS — E781 Pure hyperglyceridemia: Secondary | ICD-10-CM

## 2019-11-12 NOTE — Telephone Encounter (Signed)
Pls order, cbc, cmp, vit D, tsh, lipids.  Thx, dr. Lovena Le

## 2019-11-12 NOTE — Telephone Encounter (Signed)
Labs ordered. Lmtc.  

## 2019-11-13 ENCOUNTER — Encounter: Payer: Self-pay | Admitting: *Deleted

## 2019-11-13 NOTE — Telephone Encounter (Signed)
Sent mychart message regarding completing lab work.

## 2019-11-18 ENCOUNTER — Other Ambulatory Visit: Payer: Self-pay | Admitting: *Deleted

## 2019-11-18 DIAGNOSIS — R5383 Other fatigue: Secondary | ICD-10-CM

## 2019-11-18 DIAGNOSIS — I10 Essential (primary) hypertension: Secondary | ICD-10-CM

## 2019-11-18 DIAGNOSIS — Z1329 Encounter for screening for other suspected endocrine disorder: Secondary | ICD-10-CM

## 2019-11-18 DIAGNOSIS — Z79899 Other long term (current) drug therapy: Secondary | ICD-10-CM

## 2019-11-18 DIAGNOSIS — E781 Pure hyperglyceridemia: Secondary | ICD-10-CM

## 2019-11-26 ENCOUNTER — Other Ambulatory Visit: Payer: Self-pay | Admitting: Family Medicine

## 2019-11-26 NOTE — Telephone Encounter (Signed)
Wellness 08/02/19

## 2019-11-28 NOTE — Telephone Encounter (Signed)
May have 90-day keep follow-up visit with Dr. Lovena Le

## 2019-12-09 ENCOUNTER — Other Ambulatory Visit: Payer: Self-pay

## 2019-12-09 ENCOUNTER — Encounter: Payer: Self-pay | Admitting: Family Medicine

## 2019-12-09 ENCOUNTER — Ambulatory Visit (INDEPENDENT_AMBULATORY_CARE_PROVIDER_SITE_OTHER): Payer: Commercial Managed Care - PPO | Admitting: Family Medicine

## 2019-12-09 VITALS — BP 126/82 | HR 77 | Temp 97.0°F | Ht 64.0 in | Wt 180.0 lb

## 2019-12-09 DIAGNOSIS — I10 Essential (primary) hypertension: Secondary | ICD-10-CM | POA: Diagnosis not present

## 2019-12-09 DIAGNOSIS — F418 Other specified anxiety disorders: Secondary | ICD-10-CM

## 2019-12-09 DIAGNOSIS — E559 Vitamin D deficiency, unspecified: Secondary | ICD-10-CM | POA: Diagnosis not present

## 2019-12-09 DIAGNOSIS — E876 Hypokalemia: Secondary | ICD-10-CM | POA: Diagnosis not present

## 2019-12-09 DIAGNOSIS — C44310 Basal cell carcinoma of skin of unspecified parts of face: Secondary | ICD-10-CM

## 2019-12-09 DIAGNOSIS — G35 Multiple sclerosis: Secondary | ICD-10-CM

## 2019-12-09 DIAGNOSIS — E781 Pure hyperglyceridemia: Secondary | ICD-10-CM

## 2019-12-09 MED ORDER — SERTRALINE HCL 100 MG PO TABS: 100.0000 mg | ORAL_TABLET | Freq: Every day | ORAL | 1 refills | Status: DC

## 2019-12-09 MED ORDER — VITAMIN D-3 125 MCG (5000 UT) PO TABS: 1.0000 | ORAL_TABLET | Freq: Every day | ORAL | 1 refills | Status: AC

## 2019-12-09 NOTE — Progress Notes (Signed)
Patient ID: Krystal Silva, female    DOB: Oct 04, 1955, 64 y.o.   MRN: [id number]   Chief Complaint  Patient presents with  . Hypertension   Subjective:    HPI  Here for f/u HTN and HLD.  Had skin cancer, BCC removed from rt cheek.  Going 8/19 for follow up. Seeing [facility]. Doing well. Dermatology- Dr. Denna Haggard.  Labs repeated improved from last visit.  Had elevated TSH at that time, on recheck is normal.  Has "bad knees" and rt knee bothering her a lot.  Has had different injections, but has had arthroscopy. But still has pain in bilateral knees. Meniscus surgery on rt knee, and still doesn't feel right.  Rt cheek- incision with sutures - healing well right cheek.  Medical History Krystal Silva has a past medical history of Allergy, Basal cell carcinoma (09/04/2019), Diverticulitis, Hypercholesteremia, Hypertension, Insomnia, MS (multiple sclerosis) ([place]), and Vitamin D deficiency.   Outpatient Encounter Medications as of 12/09/2019  Medication Sig  . AVONEX PEN 30 MCG/0.5ML AJKT INJECT 30MCG  INTRAMUSCULARLY WEEKLY  . baclofen (LIORESAL) 10 MG tablet TAKE 1 TABLET 3 TIMES DAILY AS NEEDED.  [name] Cholecalciferol (VITAMIN D-3) 125 MCG (5000 UT) TABS Take 1 tablet by mouth daily.  [name] ibuprofen (ADVIL,MOTRIN) 800 MG tablet TAKE 1 TABLET BY MOUTH 3 TIMES A DAY AS NEEDED FOR PAIN  . imipramine (TOFRANIL) 25 MG tablet TAKE 1 TO 2 TABLETS BY MOUTH AT BEDTIME (Patient not taking: Reported on 12/09/2019)  . KLOR-CON M10 10 MEQ tablet TAKE 1 TABLET BY MOUTH EVERY DAY  . LACTOBACILLUS RHAMNOSUS, GG, PO Take 4 capsules by mouth daily.   [name] losartan-hydrochlorothiazide (HYZAAR) 100-12.5 MG tablet TAKE 1 TABLET BY MOUTH EVERY DAY FOR BLOOD PRESSURE  . Multiple Vitamin (MULTIVITAMIN) tablet Take 1 tablet by mouth daily. (Patient not taking: Reported on 12/09/2019)  . NAPROXEN PO Take by mouth as needed.   . Probiotic Product (PROBIOTIC DAILY PO) Take by mouth.  . sertraline (ZOLOFT)  100 MG tablet Take 1 tablet (100 mg total) by mouth daily.  . simvastatin (ZOCOR) 40 MG tablet TAKE 1 TABLET BY MOUTH EVERYDAY AT BEDTIME  . vitamin E 400 UNIT capsule Take 800 Units by mouth daily.  . [DISCONTINUED] Cholecalciferol (VITAMIN D-3) 5000 UNITS TABS Take by mouth daily.  . [DISCONTINUED] sertraline (ZOLOFT) 100 MG tablet Take 1 tablet (100 mg total) by mouth daily.   No facility-administered encounter medications on file as of 12/09/2019.     Review of Systems  Constitutional: Negative for chills and fever.  HENT: Negative for congestion, rhinorrhea and sore throat.   Respiratory: Negative for cough, shortness of breath and wheezing.   Cardiovascular: Negative for chest pain and leg swelling.  Gastrointestinal: Negative for abdominal pain, diarrhea, nausea and vomiting.  Genitourinary: Negative for dysuria and frequency.  Musculoskeletal: Negative for arthralgias and back pain.  Skin: Negative for rash.       +incision with sutures, rt cheek  Neurological: Negative for dizziness, weakness and headaches.     Vitals BP 126/82   Pulse 77   Temp (!) 97 F (36.1 C) (Oral)   Ht 5\' 4"  (1.626 m)   Wt 180 lb (81.6 kg)   SpO2 97%   BMI 30.90 kg/m   Objective:   Physical Exam Vitals and nursing note reviewed.  Constitutional:      General: She is not in acute distress.    Appearance: Normal appearance. She is not ill-appearing.  HENT:  Head: Normocephalic and atraumatic.      Comments: +rt cheek with 2 inch incision with sutures in place, no erythema or warmth.  Healing well.    Nose: Nose normal.     Mouth/Throat:     Mouth: Mucous membranes are moist.     Pharynx: Oropharynx is clear.  Eyes:     Extraocular Movements: Extraocular movements intact.     Conjunctiva/sclera: Conjunctivae normal.     Pupils: Pupils are equal, round, and reactive to light.  Cardiovascular:     Rate and Rhythm: Normal rate and regular rhythm.     Pulses: Normal pulses.      Heart sounds: Normal heart sounds.  Pulmonary:     Effort: Pulmonary effort is normal. No respiratory distress.     Breath sounds: Normal breath sounds. No wheezing, rhonchi or rales.  Musculoskeletal:        General: Normal range of motion.     Right lower leg: No edema.     Left lower leg: No edema.  Skin:    General: Skin is warm and dry.     Findings: No lesion or rash.  Neurological:     General: No focal deficit present.     Mental Status: She is alert and oriented to person, place, and time.  Psychiatric:        Mood and Affect: Mood normal.        Behavior: Behavior normal.     Assessment and Plan   1. Essential hypertension, benign  2. Vitamin D deficiency - Cholecalciferol (VITAMIN D-3) 125 MCG (5000 UT) TABS; Take 1 tablet by mouth daily.  Dispense: 90 tablet; Refill: 1  3. Basal cell carcinoma (BCC) of skin of face, unspecified part of face  4. Hypokalemia  5. Hypertriglyceridemia  6. Multiple sclerosis ([place])  7. Depression with anxiety - sertraline (ZOLOFT) 100 MG tablet; Take 1 tablet (100 mg total) by mouth daily.  Dispense: 90 tablet; Refill: 1   HTN- controlled, stable, cont meds. HLD- controlled, stable, cont meds. Depression/anxiety- stable, cont with zoloft. hypokalemia- normal, cont kclor.  Vit d defic- inc supplement to 5,000 units daily, recheck on next visit.  MS- stable.  Seeing dr. Felecia Shelling, neuro and seeing for MS. Taking avonex, baclofen,and imipramine.  F/u with [facility] for removal of sutures and recheck of wound.  F/u 64month.

## 2019-12-09 NOTE — Progress Notes (Signed)
   Patient ID: Krystal Silva, female    DOB: 02-26-1956, 64 y.o.   MRN: [id number]   Chief Complaint  Patient presents with  . Hypertension   Subjective:   Patient had carcinoma removed from right side of face on Thursday HPI   Medical History Girl has a past medical history of Allergy, Basal cell carcinoma (09/04/2019), Diverticulitis, Hypercholesteremia, Hypertension, Insomnia, MS (multiple sclerosis) ([place]), and Vitamin D deficiency.   Outpatient Encounter Medications as of 12/09/2019  Medication Sig  . AVONEX PEN 30 MCG/0.5ML AJKT INJECT 30MCG  INTRAMUSCULARLY WEEKLY  . baclofen (LIORESAL) 10 MG tablet TAKE 1 TABLET 3 TIMES DAILY AS NEEDED.  [name] Cholecalciferol (VITAMIN D-3) 5000 UNITS TABS Take by mouth daily.  [name] ibuprofen (ADVIL,MOTRIN) 800 MG tablet TAKE 1 TABLET BY MOUTH 3 TIMES A DAY AS NEEDED FOR PAIN  . imipramine (TOFRANIL) 25 MG tablet TAKE 1 TO 2 TABLETS BY MOUTH AT BEDTIME  . KLOR-CON M10 10 MEQ tablet TAKE 1 TABLET BY MOUTH EVERY DAY  . LACTOBACILLUS RHAMNOSUS, GG, PO Take 4 capsules by mouth daily.   [name] losartan-hydrochlorothiazide (HYZAAR) 100-12.5 MG tablet TAKE 1 TABLET BY MOUTH EVERY DAY FOR BLOOD PRESSURE  . Multiple Vitamin (MULTIVITAMIN) tablet Take 1 tablet by mouth daily.  [name] NAPROXEN PO Take by mouth as needed.   . Probiotic Product (PROBIOTIC DAILY PO) Take by mouth.  . sertraline (ZOLOFT) 100 MG tablet Take 1 tablet (100 mg total) by mouth daily.  . simvastatin (ZOCOR) 40 MG tablet TAKE 1 TABLET BY MOUTH EVERYDAY AT BEDTIME  . vitamin E 400 UNIT capsule Take 800 Units by mouth daily.   No facility-administered encounter medications on file as of 12/09/2019.     Review of Systems   Vitals BP 126/82   Pulse 77   Temp (!) 97 F (36.1 C) (Oral)   Ht 5\' 4"  (1.626 m)   Wt 180 lb (81.6 kg)   SpO2 97%   BMI 30.90 kg/m   Objective:   Physical Exam   Assessment and Plan   There are no diagnoses linked to this encounter.

## 2019-12-11 ENCOUNTER — Other Ambulatory Visit: Payer: Self-pay

## 2019-12-11 ENCOUNTER — Encounter: Payer: Self-pay | Admitting: Neurology

## 2019-12-11 ENCOUNTER — Ambulatory Visit (INDEPENDENT_AMBULATORY_CARE_PROVIDER_SITE_OTHER): Payer: Commercial Managed Care - PPO | Admitting: Neurology

## 2019-12-11 VITALS — BP 151/82 | HR 83 | Ht 64.0 in | Wt 178.0 lb

## 2019-12-11 DIAGNOSIS — R269 Unspecified abnormalities of gait and mobility: Secondary | ICD-10-CM

## 2019-12-11 DIAGNOSIS — R208 Other disturbances of skin sensation: Secondary | ICD-10-CM | POA: Diagnosis not present

## 2019-12-11 DIAGNOSIS — Z8616 Personal history of COVID-19: Secondary | ICD-10-CM | POA: Diagnosis not present

## 2019-12-11 DIAGNOSIS — G35 Multiple sclerosis: Secondary | ICD-10-CM | POA: Diagnosis not present

## 2019-12-11 NOTE — Progress Notes (Signed)
[facility]  PATIENT: Krystal Silva DOB: [birth year]  REFERRING DOCTOR OR PCP:   Coralie Keens SOURCE: patient, records from initial Neurology group and MRI images on PACS/CD  _________________________________   HISTORICAL  CHIEF COMPLAINT:  Chief Complaint  Patient presents with  . Follow-up    RM 12, alone. Last seen 05/02/2019. Just had basal cell carcinoma removed from right side of face.   . Multiple Sclerosis    On Avonex. tolerating well, no SE.     HISTORY OF PRESENT ILLNESS:  Krystal Silva is a 64 y.o.  woman with relapsing remitting MS diagnosed in 2006.  Update 12/11/2019: She is on Avonex and tolerated well.  Occasionally she will get some muscle aches after the injections or knots at the sites.     We have dicussed options but, as she is stable MS-wise, she'd like to stay on Avonex.      Her gait is unchanged with mildly reduced balance but more issues with knees than MS.    Legs are strong.  She has mild spasticity, helped by baclofen.  No new sensory changes.  She does have existing dysesthesias in the limbs..   Bladder function is fine.   Vision is doing well and she has appt later this summer.     She has fatigue some afternoons, especially if more busy that day.    Mood is worse this years with everything going on.  She is on sertraline and imipramine.      She had Covid October (PCR verified).   She has done [employer] vaccination x 2.    She had a basal cell carcinoma removed.  Vit D was low normal at 37 last checked.     MS History:   In 2003, she had an episode of diplopia. An MRI was performed but she was not diagnosed with MS at that time. In 2006, in [place],  she presented with dysesthetic sensations in the legs, mostly around the shin. She had MRI's performed that were more consistent with MS. She also had a lumbar puncture and the CSF was consistent with MS. The CSF showed  oligoclonal bands and IgG index of 1.37 (elevated). She started to see Dr. George Hugh and then Dr. Shelia Media in 2010/2011 after moving to the area.    An MRI performed in 2011 was reportedly very similar to the one from 2006. She has been on Avonex therapy since diagnosis.[name]  REVIEW OF SYSTEMS: Constitutional: No fevers, chills, sweats, or change in appetite Eyes: No visual changes, double vision, eye pain Ear, nose and throat: No hearing loss, ear pain, nasal congestion, sore throat Cardiovascular: No chest pain, palpitations Respiratory: No shortness of breath at rest or with exertion.   No wheezes GastrointestinaI: No nausea, vomiting, diarrhea, abdominal pain, fecal incontinence Genitourinary: No dysuria, urinary retention or frequency.  No nocturia. Musculoskeletal: No neck pain, back pain Integumentary: No rash, pruritus, skin lesions Neurological: as above Psychiatric: No depression at this time.  No anxiety Endocrine: No palpitations, diaphoresis, change in appetite, change in weigh or increased thirst Hematologic/Lymphatic: No anemia, purpura, petechiae. Allergic/Immunologic: No itchy/runny eyes, nasal congestion, recent allergic reactions, rashes  ALLERGIES: Allergies  Allergen Reactions  . Cefzil [Cefprozil] Diarrhea  . Codeine   . Penicillins     REACTION: rash  . Sulfonamide Derivatives   . Sulfamethoxazole Rash    HOME MEDICATIONS:  Current Outpatient Medications:  .  AVONEX PEN 30 MCG/0.5ML AJKT, INJECT 30MCG  INTRAMUSCULARLY WEEKLY, Disp: 12 each, Rfl: 3 .  baclofen (LIORESAL) 10 MG tablet, TAKE 1 TABLET 3 TIMES DAILY AS NEEDED., Disp: 270 tablet, Rfl: 3 .  Cholecalciferol (VITAMIN D-3) 125 MCG (5000 UT) TABS, Take 1 tablet by mouth daily., Disp: 90 tablet, Rfl: 1 .  ibuprofen (ADVIL,MOTRIN) 800 MG tablet, TAKE 1 TABLET BY MOUTH 3 TIMES A DAY AS NEEDED FOR PAIN, Disp: 90 tablet, Rfl: 1 .  imipramine (TOFRANIL) 25 MG tablet, TAKE 1 TO 2 TABLETS BY MOUTH AT BEDTIME,  Disp: 180 tablet, Rfl: 3 .  KLOR-CON M10 10 MEQ tablet, TAKE 1 TABLET BY MOUTH EVERY DAY, Disp: 90 tablet, Rfl: 0 .  LACTOBACILLUS RHAMNOSUS, GG, PO, Take 4 capsules by mouth daily. , Disp: , Rfl:  .  losartan-hydrochlorothiazide (HYZAAR) 100-12.5 MG tablet, TAKE 1 TABLET BY MOUTH EVERY DAY FOR BLOOD PRESSURE, Disp: 90 tablet, Rfl: 0 .  NAPROXEN PO, Take by mouth as needed. , Disp: , Rfl:  .  Probiotic Product (PROBIOTIC DAILY PO), Take by mouth., Disp: , Rfl:  .  sertraline (ZOLOFT) 100 MG tablet, Take 1 tablet (100 mg total) by mouth daily., Disp: 90 tablet, Rfl: 1 .  simvastatin (ZOCOR) 40 MG tablet, TAKE 1 TABLET BY MOUTH EVERYDAY AT BEDTIME, Disp: 90 tablet, Rfl: 0  PAST MEDICAL HISTORY: Past Medical History:  Diagnosis Date  . Allergy   . Basal cell carcinoma 09/04/2019   right parotid area - Refer to [facility]  . Diverticulitis   . Hypercholesteremia   . Hypertension   . Insomnia   . MS (multiple sclerosis) ([place])   . Vitamin D deficiency     PAST SURGICAL HISTORY: Past Surgical History:  Procedure Laterality Date  . COLONOSCOPY  08/21/2009  . KNEE ARTHROSCOPY WITH MEDIAL MENISECTOMY Right 03/27/2015   Procedure: KNEE ARTHROSCOPY WITH PARTIAL MEDIAL MENISECTOMY AND MICROFRACTURE;  Surgeon: Carole Civil, MD;  Location: [facility];  Service: Orthopedics;  Laterality: Right;  . TONSILLECTOMY    . WISDOM TOOTH EXTRACTION  1980's    FAMILY HISTORY: Family History  Problem Relation Age of Onset  . Hypertension Mother   . Hyperlipidemia Father   . Hearing loss Father   . Hyperlipidemia Brother   . Diabetes Brother   . Vision loss Maternal Grandmother   . Vision loss Paternal Grandmother   . Cancer Other        breast    SOCIAL HISTORY:  Social History   Socioeconomic History  . Marital status: Divorced    Spouse name: Not on file  . Number of children: Not on file  . Years of education: 25  . Highest education level: Not on file  Occupational History  .  Occupation: work   Tobacco Use  . Smoking status: Former Smoker    Packs/day: 1.50    Years: 30.00    Pack years: 45.00    Types: Cigarettes    Start date: 12/26/1974    Quit date: 06/22/2004    Years since quitting: 15.4  . Smokeless tobacco: Never Used  Substance and Sexual Activity  . Alcohol use: Yes    Alcohol/week: 1.0 standard drink    Types: 1 Cans of beer per week    Comment: rare social  . Drug use: No  . Sexual activity: Not Currently  Other Topics Concern  . Not on file  Social History Narrative   Coffee in the morning couple of cups    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   WORKS  AS A LIBRARIAN FOR [employer]   Social Determinants of Health   Financial Resource Strain:   . Difficulty of Paying Living Expenses:   Food Insecurity:   . Worried About Charity fundraiser in the Last Year:   . Arboriculturist in the Last Year:   Transportation Needs:   . Film/video editor (Medical):   [name] Lack of Transportation (Non-Medical):   Physical Activity:   . Days of Exercise per Week:   . Minutes of Exercise per Session:   Stress:   . Feeling of Stress :   Social Connections:   . Frequency of Communication with Friends and Family:   . Frequency of Social Gatherings with Friends and Family:   . Attends Religious Services:   . Active Member of Clubs or Organizations:   . Attends Archivist Meetings:   [name] Marital Status:   Intimate Partner Violence:   . Fear of Current or Ex-Partner:   . Emotionally Abused:   [name] Physically Abused:   . Sexually Abused:      PHYSICAL EXAM  Vitals:   12/11/19 1253  BP: (!) 151/82  Pulse: 83  Weight: 178 lb (80.7 kg)  Height: 5\' 4"  (1.626 m)    Body mass index is 30.55 kg/m.   General: The patient is well-developed and well-nourished and in no acute distress  Neurologic Exam  Mental status: The patient is alert and oriented x 3 at the time of the examination. The patient has apparent normal recent and  remote memory, with an apparently normal attention span and concentration ability.   Speech is normal.  Cranial nerves: Extraocular movements are full.  Her facial strength and sensation is normal.  The trapezius strength is normal.  Hearing is normal and symmetric.  Motor:  Muscle bulk is normal.   Tone is mildly increased in legs. Strength is  5 / 5 in all 4 extremities except 4+/5 in right EHL.   Sensory: Sensory testing intact to touch and vibration in the arms and legs.  Coordination: She has good finger-nose-finger and heel-to-shin bilaterally.  Gait and station: Station is normal.  The gait has a mildly reduced stride but mostly due to right knee pain.  The tandem gait is mildly wide.  Romberg is negative.   Reflexes: Deep tendon reflexes are symmetric and increased bilaterally with  2 beats non-sustained clonus at ankles.       Multiple sclerosis (HCC)  Dysesthesia  Gait disturbance  History of 2019 novel coronavirus disease (COVID-19)  1.   She will continue Avonex.  Consider checking MRI of the brain around the time of the next visit to determine if she is having any subclinical progression.  If present, we would need to consider a different DMT.   2.   Continue baclofen for spasticity and sertraline and imipramine for mood these have recently been refilled. 3.   Return in 6 months or sooner if there are new or worsening neurologic symptoms.  Odis Turck A. Felecia Shelling, MD, PhD 7/58/8325, 4:98 PM Certified in Neurology, Clinical Neurophysiology, Sleep Medicine, Pain Medicine and Neuroimaging  [facility] [address] [place], [place] [address] [phone]

## 2020-01-15 ENCOUNTER — Telehealth: Payer: Self-pay | Admitting: *Deleted

## 2020-02-22 ENCOUNTER — Other Ambulatory Visit: Payer: Self-pay | Admitting: Family Medicine

## 2020-04-13 ENCOUNTER — Ambulatory Visit: Payer: Commercial Managed Care - PPO | Admitting: Dermatology

## 2020-05-05 ENCOUNTER — Encounter: Payer: Self-pay | Admitting: Dermatology

## 2020-05-05 ENCOUNTER — Other Ambulatory Visit: Payer: Self-pay

## 2020-05-05 ENCOUNTER — Ambulatory Visit (INDEPENDENT_AMBULATORY_CARE_PROVIDER_SITE_OTHER): Payer: Commercial Managed Care - PPO | Admitting: Dermatology

## 2020-05-05 DIAGNOSIS — Z1283 Encounter for screening for malignant neoplasm of skin: Secondary | ICD-10-CM

## 2020-05-05 DIAGNOSIS — L720 Epidermal cyst: Secondary | ICD-10-CM

## 2020-05-05 DIAGNOSIS — D485 Neoplasm of uncertain behavior of skin: Secondary | ICD-10-CM

## 2020-05-05 NOTE — Patient Instructions (Addendum)
Biopsy, Surgery (Curettage) & Surgery (Excision) Aftercare Instructions  1. Okay to remove bandage in 24 hours  2. Wash area with soap and water  3. Apply Vaseline to area twice daily until healed (Not Neosporin)  4. Okay to cover with a Band-Aid to decrease the chance of infection or prevent irritation from clothing; also it's okay to uncover lesion at home.  5. Suture instructions: return to our office in 7-10 or 10-14 days for a nurse visit for suture removal. Variable healing with sutures, if pain or itching occurs call our office. It's okay to shower or bathe 24 hours after sutures are given.  6. The following risks may occur after a biopsy, curettage or excision: bleeding, scarring, discoloration, recurrence, infection (redness, yellow drainage, pain or swelling).  7. For questions, concerns and results call our office at [place] before 4pm & Friday before 3pm. Biopsy results will be available in 1 week.   Dermatofibroma Seborrheic Keratosis Epidermoid Cyst

## 2020-05-09 ENCOUNTER — Encounter: Payer: Self-pay | Admitting: Dermatology

## 2020-05-09 NOTE — Progress Notes (Signed)
   Follow-Up Visit   Subjective  Krystal Silva is a 64 y.o. female who presents for the following: Follow-up (PATIENT WANTS BX RIGHT THIGH, BACK X2 , RIGHT FOREARM).  Several new growths Location:  Duration:  Quality:  Associated Signs/Symptoms: Modifying Factors:  Severity:  Timing: Context:   Objective  Well appearing patient in no apparent distress; mood and affect are within normal limits. Objective  Mid Back: 2 cm deep dermal somewhat fibrotic noninflamed cyst mid back  Objective  Right Thigh - Anterior: Pink-tan focally crusted 1 cm papule     Objective  Left Lower Back: Pearly pink 3 mm inferior margin of scar.     Objective  Right Flank: Pink 5 mm crust, I SK versus CIS.     Objective  Mid Back: Back plus sun exposed areas examined.  No atypical moles or melanoma.   All sun exposed areas plus back examined.   Assessment & Plan    Epidermal cyst Mid Back  We will schedule 45 minutes of chooses surgical excision.  Told risk of scar and recurrence.  Neoplasm of uncertain behavior of skin (3) Right Thigh - Anterior  Skin / nail biopsy Type of biopsy: tangential   Informed consent: discussed and consent obtained   Timeout: patient name, date of birth, surgical site, and procedure verified   Anesthesia: the lesion was anesthetized in a standard fashion   Anesthetic:  1% lidocaine w/ epinephrine 1-100,000 local infiltration Instrument used: flexible razor blade   Hemostasis achieved with: aluminum chloride and electrodesiccation   Outcome: patient tolerated procedure well   Post-procedure details: wound care instructions given    Specimen 1 - Surgical pathology Differential Diagnosis: SEB K  Check Margins: No  Left Lower Back  Skin / nail biopsy Type of biopsy: tangential   Informed consent: discussed and consent obtained   Timeout: patient name, date of birth, surgical site, and procedure verified   Procedure prep:  Patient was  prepped and draped in usual sterile fashion (Non sterile) Prep type:  Chlorhexidine Anesthesia: the lesion was anesthetized in a standard fashion   Anesthetic:  1% lidocaine w/ epinephrine 1-100,000 local infiltration Instrument used: flexible razor blade   Outcome: patient tolerated procedure well   Post-procedure details: wound care instructions given    Specimen 2 - Surgical pathology Differential Diagnosis: SEB K  Check Margins: No  Right Flank  Skin / nail biopsy Type of biopsy: tangential   Informed consent: discussed and consent obtained   Timeout: patient name, date of birth, surgical site, and procedure verified   Anesthesia: the lesion was anesthetized in a standard fashion   Anesthetic:  1% lidocaine w/ epinephrine 1-100,000 local infiltration Instrument used: flexible razor blade   Hemostasis achieved with: aluminum chloride and electrodesiccation   Outcome: patient tolerated procedure well   Post-procedure details: wound care instructions given    Specimen 3 - Surgical pathology Differential Diagnosis: SEB K   Check Margins: No  Skin exam for malignant neoplasm Mid Back  Encouraged to self examine skin twice annually.  Yearly dermatologist.     I, Lavonna Monarch, MD, have reviewed all documentation for this visit.  The documentation on 05/09/20 for the exam, diagnosis, procedures, and orders are all accurate and complete.

## 2020-05-19 ENCOUNTER — Other Ambulatory Visit: Payer: Self-pay | Admitting: Neurology

## 2020-05-20 ENCOUNTER — Other Ambulatory Visit: Payer: Self-pay | Admitting: Family Medicine

## 2020-06-10 ENCOUNTER — Other Ambulatory Visit: Payer: Self-pay

## 2020-06-10 ENCOUNTER — Encounter: Payer: Self-pay | Admitting: Family Medicine

## 2020-06-10 ENCOUNTER — Ambulatory Visit (INDEPENDENT_AMBULATORY_CARE_PROVIDER_SITE_OTHER): Payer: Commercial Managed Care - PPO | Admitting: Family Medicine

## 2020-06-10 VITALS — BP 121/81 | HR 101 | Temp 97.2°F | Ht 64.0 in | Wt 181.0 lb

## 2020-06-10 DIAGNOSIS — I10 Essential (primary) hypertension: Secondary | ICD-10-CM

## 2020-06-10 DIAGNOSIS — F418 Other specified anxiety disorders: Secondary | ICD-10-CM | POA: Diagnosis not present

## 2020-06-10 DIAGNOSIS — E781 Pure hyperglyceridemia: Secondary | ICD-10-CM | POA: Diagnosis not present

## 2020-06-10 DIAGNOSIS — E876 Hypokalemia: Secondary | ICD-10-CM | POA: Diagnosis not present

## 2020-06-10 MED ORDER — SIMVASTATIN 40 MG PO TABS: ORAL_TABLET | ORAL | 1 refills | Status: DC

## 2020-06-10 MED ORDER — POTASSIUM CHLORIDE CRYS ER 10 MEQ PO TBCR: 10.0000 meq | EXTENDED_RELEASE_TABLET | Freq: Every day | ORAL | 1 refills | Status: DC

## 2020-06-10 MED ORDER — LOSARTAN POTASSIUM-HCTZ 100-12.5 MG PO TABS: ORAL_TABLET | ORAL | 1 refills | Status: DC

## 2020-06-10 MED ORDER — SERTRALINE HCL 100 MG PO TABS: 100.0000 mg | ORAL_TABLET | Freq: Every day | ORAL | 1 refills | Status: DC

## 2020-06-10 MED ORDER — AZITHROMYCIN 250 MG PO TABS: ORAL_TABLET | ORAL | 0 refills | Status: DC

## 2020-06-10 NOTE — Progress Notes (Signed)
 Patient ID: Krystal Silva, female    DOB: 02/22/1956, 64 y.o.   MRN: [id number]   Chief Complaint  Patient presents with  . Depression   Subjective:    HPI  med check up. Pt states no concerns today.   Pt taking medication for depression taking zoloft.   Pt had cold around Christmas, was cold virus.  Had 1 round of antibiotics and it cleared up.  But feeling something up in sinuses.  Still having some green discharge.  Not headache, pressure in head.  2x per year.  Pt stating needing 2 round of abx.  Had labs in media file from 7/21.  Seeing Dr. Sater in [place] for neuro MS.  No new changes.   HTN Pt compliant with BP meds.  No SEs Denies chest pain, sob, LE swelling, or blurry vision.   HLD- doing well no new concerns.  Compliant with meds. No chest pain, palpitations, myalgias or joint pains.   Medical History Krystal Silva has a past medical history of Allergy, Basal cell carcinoma (09/04/2019), Diverticulitis, Hypercholesteremia, Hypertension, Insomnia, MS (multiple sclerosis) (HCC), and Vitamin D deficiency.   Outpatient Encounter Medications as of 06/10/2020  Medication Sig  . AVONEX PEN 30 MCG/0.5ML AJKT INJECT 30MCG  INTRAMUSCULARLY WEEKLY  . azithromycin (ZITHROMAX) 250 MG tablet Take 2 tab p.o. on 1 tab and then 1 tab for 4 more days.  . baclofen (LIORESAL) 10 MG tablet TAKE 1 TABLET 3 TIMES DAILY AS NEEDED.  . Cholecalciferol (VITAMIN D-3) 125 MCG (5000 UT) TABS Take 1 tablet by mouth daily.  . ibuprofen (ADVIL,MOTRIN) 800 MG tablet TAKE 1 TABLET BY MOUTH 3 TIMES A DAY AS NEEDED FOR PAIN  . LACTOBACILLUS RHAMNOSUS, GG, PO Take 4 capsules by mouth daily.   . NAPROXEN PO Take by mouth as needed.   . Probiotic Product (PROBIOTIC DAILY PO) Take by mouth.  . [DISCONTINUED] imipramine (TOFRANIL) 25 MG tablet TAKE 1 TO 2 TABLETS BY MOUTH AT BEDTIME  . [DISCONTINUED] KLOR-CON M10 10 MEQ tablet TAKE 1 TABLET BY MOUTH EVERY DAY  . [DISCONTINUED] losartan-hydrochlorothiazide  (HYZAAR) 100-12.5 MG tablet TAKE 1 TABLET BY MOUTH EVERY DAY FOR BLOOD PRESSURE  . [DISCONTINUED] sertraline (ZOLOFT) 100 MG tablet Take 1 tablet (100 mg total) by mouth daily.  . [DISCONTINUED] simvastatin (ZOCOR) 40 MG tablet TAKE 1 TABLET BY MOUTH EVERYDAY AT BEDTIME  . losartan-hydrochlorothiazide (HYZAAR) 100-12.5 MG tablet Take one tablet by mouth every day for blood pressure  . potassium chloride (KLOR-CON M10) 10 MEQ tablet Take 1 tablet (10 mEq total) by mouth daily.  . sertraline (ZOLOFT) 100 MG tablet Take 1 tablet (100 mg total) by mouth daily.  . simvastatin (ZOCOR) 40 MG tablet Take one tablet daily at bedtime   No facility-administered encounter medications on file as of 06/10/2020.     Review of Systems  Constitutional: Negative for chills and fever.  HENT: Negative for congestion, rhinorrhea and sore throat.   Respiratory: Negative for cough, shortness of breath and wheezing.   Cardiovascular: Negative for chest pain and leg swelling.  Gastrointestinal: Negative for abdominal pain, diarrhea, nausea and vomiting.  Genitourinary: Negative for dysuria and frequency.  Musculoskeletal: Negative for arthralgias and back pain.  Skin: Negative for rash.  Neurological: Negative for dizziness, weakness and headaches.     Vitals BP 121/81   Pulse (!) 101   Temp (!) 97.2 F (36.2 C)   Ht 5' 4" (1.626 m)   Wt 181 lb (82.1 kg)     SpO2 99%   BMI 31.07 kg/m   Objective:   Physical Exam Vitals and nursing note reviewed.  Constitutional:      General: She is not in acute distress.    Appearance: Normal appearance.  HENT:     Head: Normocephalic and atraumatic.  Cardiovascular:     Rate and Rhythm: Normal rate and regular rhythm.     Pulses: Normal pulses.     Heart sounds: Normal heart sounds.  Pulmonary:     Effort: Pulmonary effort is normal.     Breath sounds: Normal breath sounds. No wheezing, rhonchi or rales.  Musculoskeletal:        General: Normal range of  motion.     Right lower leg: No edema.     Left lower leg: No edema.  Skin:    General: Skin is warm and dry.     Findings: No lesion or rash.  Neurological:     General: No focal deficit present.     Mental Status: She is alert and oriented to person, place, and time.     Cranial Nerves: No cranial nerve deficit.  Psychiatric:        Mood and Affect: Mood normal.        Behavior: Behavior normal.        Thought Content: Thought content normal.        Judgment: Judgment normal.      Assessment and Plan   1. Essential hypertension, benign - CBC - CMP14+EGFR - Lipid panel - losartan-hydrochlorothiazide (HYZAAR) 100-12.5 MG tablet; Take one tablet by mouth every day for blood pressure  Dispense: 90 tablet; Refill: 1  2. Depression with anxiety - sertraline (ZOLOFT) 100 MG tablet; Take 1 tablet (100 mg total) by mouth daily.  Dispense: 90 tablet; Refill: 1  3. Hypertriglyceridemia - simvastatin (ZOCOR) 40 MG tablet; Take one tablet daily at bedtime  Dispense: 90 tablet; Refill: 1  4. Hypokalemia - potassium chloride (KLOR-CON M10) 10 MEQ tablet; Take 1 tablet (10 mEq total) by mouth daily.  Dispense: 90 tablet; Refill: 1   Depression/anxiety- stable. Cont zoloft.   hypertriglyceridemia-stable cont zocor.  htn- stable, cont hyzaar.  Hypokalemia- stable. Cont meds.   F/u 6mo or prn.   

## 2020-06-16 ENCOUNTER — Encounter: Payer: Self-pay | Admitting: Family Medicine

## 2020-06-16 ENCOUNTER — Ambulatory Visit (INDEPENDENT_AMBULATORY_CARE_PROVIDER_SITE_OTHER): Payer: Commercial Managed Care - PPO | Admitting: Family Medicine

## 2020-06-16 ENCOUNTER — Other Ambulatory Visit: Payer: Self-pay

## 2020-06-16 VITALS — BP 165/94 | HR 84 | Ht 64.0 in | Wt 181.0 lb

## 2020-06-16 DIAGNOSIS — R269 Unspecified abnormalities of gait and mobility: Secondary | ICD-10-CM | POA: Diagnosis not present

## 2020-06-16 DIAGNOSIS — G2581 Restless legs syndrome: Secondary | ICD-10-CM | POA: Diagnosis not present

## 2020-06-16 DIAGNOSIS — R208 Other disturbances of skin sensation: Secondary | ICD-10-CM

## 2020-06-16 DIAGNOSIS — G35 Multiple sclerosis: Secondary | ICD-10-CM | POA: Diagnosis not present

## 2020-06-16 NOTE — Patient Instructions (Signed)
Below is our plan:  We will continue current treatment plan. You may use baclofen up to 3 times daily as needed. Continue to monitor BP at home. Follow up with PCP if greater than 140/90 consistently.   Please make sure you are staying well hydrated. I recommend 50-60 ounces daily. Well balanced diet and regular exercise encouraged.    Please continue follow up with care team as directed.   Follow up with Dr Felecia Shelling in 6 months   You may receive a survey regarding today's visit. I encourage you to leave honest feed back as I do use this information to improve patient care. Thank you for seeing me today!      Multiple Sclerosis Multiple sclerosis (MS) is a disease of the brain, spinal cord, and optic nerves (central nervous system). It causes the body's disease-fighting (immune) system to destroy the protective covering (myelin sheath) around nerves in the brain. When this happens, signals (nerve impulses) going to and from the brain and spinal cord do not get sent properly or may not get sent at all. There are several types of MS:  Relapsing-remitting MS. This is the most common type. This causes sudden attacks of symptoms. After an attack, you may recover completely until the next attack, or some symptoms may remain permanently.  Secondary progressive MS. This usually develops after the onset of relapsing-remitting MS. Similar to relapsing-remitting MS, this type also causes sudden attacks of symptoms. Attacks may be less frequent, but symptoms slowly get worse (progress) over time.  Primary progressive MS. This causes symptoms that steadily progress over time. This type of MS does not cause sudden attacks of symptoms. The age of onset of MS varies, but it often develops between 50-25 years of age. MS is a lifelong (chronic) condition. There is no cure, but treatment can help slow down the progression of the disease. What are the causes? The cause of this condition is not known. What  increases the risk? You are more likely to develop this condition if:  You are a woman.  You have a relative with MS. However, the condition is not passed from parent to child (inherited).  You have a lack (deficiency) of vitamin D.  You smoke. MS is more common in the [place] than in the [place]. What are the signs or symptoms? Relapsing-remitting and secondary progressive MS cause symptoms to occur in episodes or attacks that may last weeks to months. There may be long periods between attacks in which there are almost no symptoms. Primary progressive MS causes symptoms to steadily progress after they develop. Symptoms of MS vary because of the many different ways it affects the central nervous system. The main symptoms include:  Vision problems and eye pain.  Numbness and weakness.  Inability to move your arms, hands, feet, or legs (paralysis).  Balance problems.  Shaking that you cannot control (tremors).  Muscle spasms.  Problems with thinking (cognitive changes). MS can also cause symptoms that are associated with the disease, but are not always the direct result of an MS attack. They may include:  Inability to control urination or bowel movements (incontinence).  Headaches.  Fatigue.  Inability to tolerate heat.  Emotional changes.  Depression.  Pain. How is this diagnosed? This condition is diagnosed based on:  Your symptoms.  A neurological exam. This involves checking central nervous system function, such as nerve function, reflexes, and coordination.  MRIs of the brain and spinal cord.  Lab tests, including  a lumbar puncture that tests the fluid that surrounds the brain and spinal cord (cerebrospinal fluid).  Tests to measure the electrical activity of the brain in response to stimulation (evoked potentials). How is this treated? There is no cure for MS, but medicines can help decrease the number and frequency of attacks  and help relieve nuisance symptoms. Treatment options may include:  Medicines that reduce the frequency of attacks. These medicines may be given by injection, by mouth (orally), or through an IV.  Medicines that reduce inflammation (steroids). These may provide short-term relief of symptoms.  Medicines to help control pain, depression, fatigue, or incontinence.  Nutritional counseling. Vitamin D supplements, if you have a deficiency.  Using devices to help you move around (assistive devices), such as braces, a cane, or a walker.  Physical therapy to strengthen and stretch your muscles.  Occupational therapy to help you with everyday tasks.  Alternative or complementary treatments such as exercise, massage, or acupuncture.   Follow these instructions at home:  Take over-the-counter and prescription medicines only as told by your health care provider.  Do not drive or use heavy machinery while taking prescription pain medicine.  Use assistive devices as recommended by your physical therapist or your health care provider.  Exercise as directed by your health care provider.  Eating healthy can help manage MS symptoms.  Return to your normal activities as told by your health care provider. Ask your health care provider what activities are safe for you.  Reach out for support. Share your feelings with friends, family, or a support group.  Keep all follow-up visits as told by your health care provider and therapists. This is important. Where to find more information  National Multiple Sclerosis Society: https://www.nationalmssociety.[place] of Neurological Disorders and Stroke: http://hendricks-barton.net/  [employer] Integrative [employer]: GourmetRating.dk Contact a health care provider if:  You feel depressed.  You develop new pain or numbness.  You have tremors.  You have problems with sexual function. Get help right away  if:  You develop paralysis.  You develop numbness.  You have problems with your bladder or bowel function.  You develop double vision.  You lose vision in one or both eyes.  You develop suicidal thoughts.  You develop severe confusion. If you ever feel like you may hurt yourself or others, or have thoughts about taking your own life, get help right away. You can go to your nearest emergency department or call:  Your local emergency services (911 in the [place]  A suicide crisis helpline, such as the [place] at (820)170-1178. This is open 24 hours a day. Summary  Multiple sclerosis (MS) is a disease of the central nervous system that causes the body's immune system to destroy the protective covering (myelin sheath) around nerves in the brain.  There are 3 types of MS: relapsing-remitting, secondary progressive, and primary progressive. Relapsing-remitting and secondary progressive MS cause symptoms to occur in episodes or attacks that may last weeks to months. Primary progressive MS causes symptoms to steadily progress after they develop.  There is no cure for MS, but medicines can help decrease the number and frequency of attacks and help relieve nuisance symptoms. Treatment may also include physical or occupational therapy.  If you develop numbness, paralysis, vision problems, or other neurological symptoms, get help right away. This information is not intended to replace advice given to you by your health care provider. Make sure you discuss any questions you  have with your health care provider. Document Revised: 02/18/2020 Document Reviewed: 02/18/2020 Elsevier Patient Education  2021 [employer].

## 2020-06-16 NOTE — Progress Notes (Signed)
Chief Complaint  Patient presents with  . Follow-up    RM 2 alone Pt states she is doing good, things are about the same     HISTORY OF PRESENT ILLNESS: Today 06/16/20  Krystal Silva is a 65 y.o. female here today for follow up for  RRMS. She continues Anovex and tolerating well. MRI brain stable in 2019 compared to 2016. Labs performed with PCP.   She feels that symptoms are stable. No new or exacerbating symptoms. Baclofen usually at night helps with generalized muscle aches. She has been nervous to take it more frequently. She continues to work full time at [employer]. She has not been as active. She is having trouble with bilateral knee pain. Ortho tried steroid injections with no benefit. She is trying to hold off on knee replacement.   Mood is stable on sertraline 100mg  and imipramine 20mg  daily (PCP). She is sleeping very well. She is considering returning to counseling. She continues daily vit D supplements. Last level 34.    HISTORY (copied from Dr Garth Bigness previous note)  Krystal Silva is a 65 y.o.  woman with relapsing remitting MS diagnosed in 2006.  Update 12/11/2019: She is on Avonex and tolerated well.  Occasionally she will get some muscle aches after the injections or knots at the sites.     We have dicussed options but, as she is stable MS-wise, she'd like to stay on Avonex.      Her gait is unchanged with mildly reduced balance but more issues with knees than MS.    Legs are strong.  She has mild spasticity, helped by baclofen.  No new sensory changes.  She does have existing dysesthesias in the limbs..   Bladder function is fine.   Vision is doing well and she has appt later this summer.     She has fatigue some afternoons, especially if more busy that day.    Mood is worse this years with everything going on.  She is on sertraline and imipramine.      She had Covid October (PCR verified).   She has done [employer] vaccination x 2.    She had a  basal cell carcinoma removed.  Vit D was low normal at 37 last checked.     MS History:   In 2003, she had an episode of diplopia. An MRI was performed but she was not diagnosed with MS at that time. In 2006, in [place],  she presented with dysesthetic sensations in the legs, mostly around the shin. She had MRI's performed that were more consistent with MS. She also had a lumbar puncture and the CSF was consistent with MS. The CSF showed oligoclonal bands and IgG index of 1.37 (elevated). She started to see Dr. George Hugh and then Dr. Shelia Media in 2010/2011 after moving to the area. An MRI performed in 2011 was reportedly very similar to the one from 2006. She has been on Avonex therapy since diagnosis.[name]   REVIEW OF SYSTEMS: Out of a complete 14 system review of symptoms, the patient complains only of the following symptoms, muscle aches, anxiety, depression, bilateral knee pain and all other reviewed systems are negative.   ALLERGIES: Allergies  Allergen Reactions  . Cefzil [Cefprozil] Diarrhea  . Codeine   . Penicillins     REACTION: rash  . Sulfonamide Derivatives   . Sulfamethoxazole Rash     HOME MEDICATIONS: Outpatient Medications Prior to Visit  Medication Sig Dispense Refill  .  AVONEX PEN 30 MCG/0.5ML AJKT INJECT 30MCG  INTRAMUSCULARLY WEEKLY 12 each 3  . baclofen (LIORESAL) 10 MG tablet TAKE 1 TABLET 3 TIMES DAILY AS NEEDED. 270 tablet 3  . Cholecalciferol (VITAMIN D-3) 125 MCG (5000 UT) TABS Take 1 tablet by mouth daily. 90 tablet 1  . ibuprofen (ADVIL,MOTRIN) 800 MG tablet TAKE 1 TABLET BY MOUTH 3 TIMES A DAY AS NEEDED FOR PAIN 90 tablet 1  . imipramine (TOFRANIL) 10 MG tablet Take 20 mg by mouth at bedtime.    [name] LACTOBACILLUS RHAMNOSUS, GG, PO Take 4 capsules by mouth daily.     [name] losartan-hydrochlorothiazide (HYZAAR) 100-12.5 MG tablet Take one tablet by mouth every day for blood pressure 90 tablet 1  . NAPROXEN PO Take by mouth as needed.     . potassium chloride  (KLOR-CON M10) 10 MEQ tablet Take 1 tablet (10 mEq total) by mouth daily. 90 tablet 1  . Probiotic Product (PROBIOTIC DAILY PO) Take by mouth.    . sertraline (ZOLOFT) 100 MG tablet Take 1 tablet (100 mg total) by mouth daily. 90 tablet 1  . simvastatin (ZOCOR) 40 MG tablet Take one tablet daily at bedtime 90 tablet 1  . azithromycin (ZITHROMAX) 250 MG tablet Take 2 tab p.o. on 1 tab and then 1 tab for 4 more days. 6 tablet 0   No facility-administered medications prior to visit.     PAST MEDICAL HISTORY: Past Medical History:  Diagnosis Date  . Allergy   . Basal cell carcinoma 09/04/2019   right parotid area - Refer to [facility]  . Diverticulitis   . Hypercholesteremia   . Hypertension   . Insomnia   . MS (multiple sclerosis) ([place])   . Vitamin D deficiency      PAST SURGICAL HISTORY: Past Surgical History:  Procedure Laterality Date  . COLONOSCOPY  08/21/2009  . KNEE ARTHROSCOPY WITH MEDIAL MENISECTOMY Right 03/27/2015   Procedure: KNEE ARTHROSCOPY WITH PARTIAL MEDIAL MENISECTOMY AND MICROFRACTURE;  Surgeon: Carole Civil, MD;  Location: [facility];  Service: Orthopedics;  Laterality: Right;  . TONSILLECTOMY    . WISDOM TOOTH EXTRACTION  1980's     FAMILY HISTORY: Family History  Problem Relation Age of Onset  . Hypertension Mother   . Hyperlipidemia Father   . Hearing loss Father   . Hyperlipidemia Brother   . Diabetes Brother   . Vision loss Maternal Grandmother   . Vision loss Paternal Grandmother   . Cancer Other        breast     SOCIAL HISTORY: Social History   Socioeconomic History  . Marital status: Divorced    Spouse name: Not on file  . Number of children: Not on file  . Years of education: 72  . Highest education level: Not on file  Occupational History  . Occupation: work   Tobacco Use  . Smoking status: Former Smoker    Packs/day: 1.50    Years: 30.00    Pack years: 45.00    Types: Cigarettes    Start date: 12/26/1974    Quit date: 06/22/2004     Years since quitting: 15.9  . Smokeless tobacco: Never Used  Vaping Use  . Vaping Use: Never used  Substance and Sexual Activity  . Alcohol use: Yes    Alcohol/week: 1.0 standard drink    Types: 1 Cans of beer per week    Comment: rare social  . Drug use: No  . Sexual activity: Not Currently  Other Topics Concern  .  Not on file  Social History Narrative   Coffee in the morning couple of cups    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[employer]   Social Determinants of Health   Financial Resource Strain: Not on file  Food Insecurity: Not on file  Transportation Needs: Not on file  Physical Activity: Not on file  Stress: Not on file  Social Connections: Not on file  Intimate Partner Violence: Not on file      PHYSICAL EXAM  Vitals:   06/16/20 0842  BP: (!) 165/94  Pulse: 84  Weight: 181 lb (82.1 kg)  Height: 5\' 4"  (1.626 m)   Body mass index is 31.07 kg/m.   Generalized: Well developed, in no acute distress  Cardiology: normal rate and rhythm, no murmur auscultated  Respiratory: clear to auscultation bilaterally    Neurological examination  Mentation: Alert oriented to time, place, history taking. Follows all commands speech and language fluent Cranial nerve II-XII: Pupils were equal round reactive to light. Extraocular movements were full, visual field were full on confrontational test. Facial sensation and strength were normal. Uvula tongue midline. Head turning and shoulder shrug  were normal and symmetric. Motor: The motor testing reveals 5 over 5 strength of all 4 extremities. Good symmetric motor tone is noted throughout.  Sensory: Sensory testing is intact to soft touch on all 4 extremities. No evidence of extinction is noted.  Coordination: Cerebellar testing reveals good finger-nose-finger and heel-to-shin bilaterally.  Gait and station: Gait is normal. Tandem gait is slightly unsteady.  Reflexes: Deep tendon reflexes are  brisk but symmetric and bilaterally.     DIAGNOSTIC DATA (LABS, IMAGING, TESTING) - I reviewed patient records, labs, notes, testing and imaging myself where available.  Lab Results  Component Value Date   WBC 9.9 06/22/2017   HGB 16.0 (H) 06/22/2017   HCT 46.9 (H) 06/22/2017   MCV 87.5 06/22/2017   PLT 262 06/22/2017      Component Value Date/Time   NA 141 06/22/2017 1053   K 3.3 (L) 06/22/2017 1053   CL 103 06/22/2017 1053   CO2 27 06/22/2017 1053   GLUCOSE 127 (H) 06/22/2017 1053   BUN 10 06/22/2017 1053   CREATININE 0.94 06/22/2017 1053   CALCIUM 9.7 06/22/2017 1053   PROT 7.5 01/20/2010 1950   ALBUMIN 4.2 01/20/2010 1950   AST 22 01/20/2010 1950   ALT 28 01/20/2010 1950   ALKPHOS 61 01/20/2010 1950   BILITOT 0.8 01/20/2010 1950   GFRNONAA >60 06/22/2017 1053   GFRAA >60 06/22/2017 1053   No results found for: CHOL, HDL, LDLCALC, LDLDIRECT, TRIG, CHOLHDL No results found for: HGBA1C No results found for: VITAMINB12 No results found for: TSH  No flowsheet data found.   ASSESSMENT AND PLAN  65 y.o. year old female  has a past medical history of Allergy, Basal cell carcinoma (09/04/2019), Diverticulitis, Hypercholesteremia, Hypertension, Insomnia, MS (multiple sclerosis) ([place]), and Vitamin D deficiency. here with   Multiple sclerosis ([place])  Dysesthesia  Gait disturbance  Restless leg syndrome   Sharan is doing well, today. We will continue current treatment plan. She was encouraged to keep a close eye on her BP at home. She feels it is elevated her due to anxiety. Usually normal at home. Healthy lifestyle habits encouraged. She may take up to three baclofen as needed for pain. I have encouraged her to consider low impact exercise. We discussed options for counseling. She will inquire about EAP  program with employer. She will follow up with Dr Felecia Shelling in 6 months. She request yearly follow up and will discuss with him.    No orders of the defined types were  placed in this encounter.     I spent 25 minutes of face-to-face and non-face-to-face time with patient.  This included previsit chart review, lab review, study review, order entry, electronic health record documentation, patient education.    Debbora Presto, MSN, FNP-C 06/16/2020, 9:58 AM  [facility] [address], [place], [place] [address] [phone]

## 2020-06-16 NOTE — Progress Notes (Signed)
I have read the note, and I agree with the clinical assessment and plan.  Chaya Dehaan A. Chrisma Hurlock, MD, PhD, FAAN Certified in Neurology, Clinical Neurophysiology, Sleep Medicine, Pain Medicine and Neuroimaging  [facility] [address], [address] [place], [place] [address] [phone]  

## 2020-06-19 ENCOUNTER — Telehealth: Payer: Self-pay

## 2020-06-19 ENCOUNTER — Other Ambulatory Visit: Payer: Self-pay | Admitting: Nurse Practitioner

## 2020-06-19 MED ORDER — ONDANSETRON 4 MG PO TBDP: 4.0000 mg | ORAL_TABLET | Freq: Three times a day (TID) | ORAL | 0 refills | Status: DC | PRN

## 2020-06-19 NOTE — Telephone Encounter (Signed)
Will send in some medicine to help N/V but she needs to keep clear fluids going. Make sure mouth is moist and that she is voiding light color urine. If she gets worse over the weekend, recommend she gets seen. Thanks.

## 2020-06-19 NOTE — Telephone Encounter (Signed)
Please advise. Thank you

## 2020-06-19 NOTE — Telephone Encounter (Signed)
Discussed with pt. Pt verbalized understanding.  °

## 2020-06-19 NOTE — Telephone Encounter (Signed)
Pt went to [facility] and started throwing up and diarrhea last night I told her we did not have appt she needed to go to urgent care she wants something to make her stop throwing up or what she can take   Pt call back [phone]

## 2020-06-25 ENCOUNTER — Ambulatory Visit (INDEPENDENT_AMBULATORY_CARE_PROVIDER_SITE_OTHER): Payer: Commercial Managed Care - PPO | Admitting: Dermatology

## 2020-06-25 ENCOUNTER — Encounter: Payer: Self-pay | Admitting: Dermatology

## 2020-06-25 ENCOUNTER — Other Ambulatory Visit: Payer: Self-pay

## 2020-06-25 DIAGNOSIS — D2361 Other benign neoplasm of skin of right upper limb, including shoulder: Secondary | ICD-10-CM

## 2020-06-25 DIAGNOSIS — L729 Follicular cyst of the skin and subcutaneous tissue, unspecified: Secondary | ICD-10-CM

## 2020-06-25 DIAGNOSIS — D239 Other benign neoplasm of skin, unspecified: Secondary | ICD-10-CM

## 2020-06-25 DIAGNOSIS — D485 Neoplasm of uncertain behavior of skin: Secondary | ICD-10-CM

## 2020-06-25 NOTE — Patient Instructions (Signed)

## 2020-07-04 ENCOUNTER — Encounter: Payer: Self-pay | Admitting: Dermatology

## 2020-07-05 NOTE — Progress Notes (Signed)
   Follow-Up Visit   Subjective  Krystal Silva is a 65 y.o. female who presents for the following: Procedure (Patient here today for cyst removal on mid lower back).  Growths on arm and back Location:  Duration:  Quality:  Associated Signs/Symptoms: Modifying Factors:  Severity:  Timing: Context: Patient requests removal.  All risks detailed before surgery including scar, recurrence, and noncoverage by insurance.  Objective  Well appearing patient in no apparent distress; mood and affect are within normal limits. Objective  Lower Mid Back: 1.5cm deep dermal to subcutaneous nonfluctuant nodule compatible with epidermoid cyst  Images    Lower Right Forearm - Anterior  Objective  Lower Right Forearm - Anterior: 6 mm firm pink dermal papule typical of dermatofibroma   A focused examination was performed including Back and arms.. Relevant physical exam findings are noted in the Assessment and Plan.   Assessment & Plan    Cyst of skin Lower Mid Back  Skin excision - Lower Mid Back  Lesion length (cm):  1.5 Lesion width (cm):  1.5 Margin per side (cm):  0 Total excision diameter (cm):  1.5 Informed consent: discussed and consent obtained   Timeout: patient name, date of birth, surgical site, and procedure verified   Anesthesia: the lesion was anesthetized in a standard fashion   Anesthetic:  1% lidocaine w/ epinephrine 1-100,000 local infiltration Instrument used: #15 blade   Hemostasis achieved with: suture and electrodesiccation   Outcome: patient tolerated procedure well with no complications   Post-procedure details: sterile dressing applied and wound care instructions given   Dressing type: bandage, petrolatum and pressure dressing   Additional details:  5-0 Ethilon x 3  Specimen 1 - Surgical pathology Differential Diagnosis: R/O Cyst  Check Margins: No  Dermatofibroma Lower Right Forearm - Anterior  Skin excision - Lower Right Forearm -  Anterior  Lesion length (cm):  0.6 Lesion width (cm):  0.6 Margin per side (cm):  0 Total excision diameter (cm):  0.6 Informed consent: discussed and consent obtained   Timeout: patient name, date of birth, surgical site, and procedure verified   Anesthesia: the lesion was anesthetized in a standard fashion   Anesthetic:  1% lidocaine w/ epinephrine 1-100,000 local infiltration Instrument used: #15 blade   Hemostasis achieved with: suture and ferric subsulfate   Outcome: patient tolerated procedure well with no complications   Post-procedure details: sterile dressing applied and wound care instructions given   Dressing type: bandage, petrolatum and pressure dressing   Additional details:  5-0 Ethilon x 3  Specimen 2 - Surgical pathology Differential Diagnosis: R/O DF  Check Margins: No     I, Lavonna Monarch, MD, have reviewed all documentation for this visit.  The documentation on 07/05/20 for the exam, diagnosis, procedures, and orders are all accurate and complete.

## 2020-07-09 ENCOUNTER — Other Ambulatory Visit: Payer: Self-pay

## 2020-07-09 ENCOUNTER — Ambulatory Visit (INDEPENDENT_AMBULATORY_CARE_PROVIDER_SITE_OTHER): Payer: Commercial Managed Care - PPO

## 2020-07-09 DIAGNOSIS — Z4802 Encounter for removal of sutures: Secondary | ICD-10-CM

## 2020-07-09 NOTE — Progress Notes (Signed)
NTS Suture removal, No s/s of infection, path to pt. 

## 2020-07-14 ENCOUNTER — Other Ambulatory Visit: Payer: Self-pay

## 2020-07-14 ENCOUNTER — Ambulatory Visit (INDEPENDENT_AMBULATORY_CARE_PROVIDER_SITE_OTHER): Payer: Self-pay | Admitting: *Deleted

## 2020-07-14 VITALS — Ht 64.0 in | Wt 177.2 lb

## 2020-07-14 DIAGNOSIS — Z1211 Encounter for screening for malignant neoplasm of colon: Secondary | ICD-10-CM

## 2020-07-14 NOTE — Progress Notes (Addendum)
Gastroenterology Pre-Procedure Review  Request Date: 07/14/2020 Requesting Physician: 10 year recall, Last TCS 08/21/2009 done by Dr. Oneida Alar, hyperplastic polyp, benign colonic mucosa  PATIENT REVIEW QUESTIONS: The patient responded to the following health history questions as indicated:    1. Diabetes Melitis: no 2. Joint replacements in the past 12 months: no 3. Major health problems in the past 3 months: no 4. Has an artificial valve or MVP: no 5. Has a defibrillator: no 6. Has been advised in past to take antibiotics in advance of a procedure like teeth cleaning: no 7. Family history of colon cancer: no 8. Alcohol Use: yes, 1 glass of wine a month 9. Illicit drug Use: no 10. History of sleep apnea: no 11. History of coronary artery or other vascular stents placed within the last 12 months: no 12. History of any prior anesthesia complications: no 13. Body mass index is 30.42 kg/m.    MEDICATIONS & ALLERGIES:    Patient reports the following regarding taking any blood thinners:   Plavix? no Aspirin? no Coumadin? no Brilinta? no Xarelto? no Eliquis? no Pradaxa? no Savaysa? no Effient? no  Patient confirms/reports the following medications:  Current Outpatient Medications  Medication Sig Dispense Refill  . AVONEX PEN 30 MCG/0.5ML AJKT INJECT 30MCG  INTRAMUSCULARLY WEEKLY 12 each 3  . baclofen (LIORESAL) 10 MG tablet TAKE 1 TABLET 3 TIMES DAILY AS NEEDED. (Patient taking differently: daily.) 270 tablet 3  . Cholecalciferol (VITAMIN D-3) 125 MCG (5000 UT) TABS Take 1 tablet by mouth daily. 90 tablet 1  . Gluc-Chonn-MSM-Boswellia-Vit D (GLUCOSAMINE CHONDROITIN + D3 PO) Take by mouth daily.    [name] ibuprofen (ADVIL,MOTRIN) 800 MG tablet TAKE 1 TABLET BY MOUTH 3 TIMES A DAY AS NEEDED FOR PAIN (Patient taking differently: 200 mg as needed. Takes 2 tablets as needed.) 90 tablet 1  . imipramine (TOFRANIL) 10 MG tablet Take 20 mg by mouth at bedtime.    [name] losartan-hydrochlorothiazide  (HYZAAR) 100-12.5 MG tablet Take one tablet by mouth every day for blood pressure 90 tablet 1  . NAPROXEN PO Take by mouth as needed.     . potassium chloride (KLOR-CON M10) 10 MEQ tablet Take 1 tablet (10 mEq total) by mouth daily. 90 tablet 1  . Probiotic Product (PROBIOTIC DAILY PO) Take by mouth. Takes 4 capsules daily.    . sertraline (ZOLOFT) 100 MG tablet Take 1 tablet (100 mg total) by mouth daily. 90 tablet 1  . simvastatin (ZOCOR) 40 MG tablet Take one tablet daily at bedtime 90 tablet 1   No current facility-administered medications for this visit.    Patient confirms/reports the following allergies:  Allergies  Allergen Reactions  . Cefzil [Cefprozil] Diarrhea  . Codeine   . Penicillins     REACTION: rash  . Sulfonamide Derivatives   . Sulfamethoxazole Rash    No orders of the defined types were placed in this encounter.   AUTHORIZATION INFORMATION Primary Insurance: [employer],  [place] #: [id number],  Group #: [id number] Pre-Cert / Josem Kaufmann required: No, not required per Barton Dubois / Auth #: Ref#: [id number]  SCHEDULE INFORMATION: Procedure has been scheduled as follows:  Date: 08/03/2020, Time: PM procedure Location: [facility] with Dr. Abbey Chatters  This Gastroenterology Pre-Precedure Review Form is being routed to the following provider(s): Neil Crouch, PA

## 2020-07-14 NOTE — Patient Instructions (Signed)
[place]   Please notify us immediately if you are diabetic, take iron supplements, or if you are on coumadin or any blood thinners.   Patient Name: Dhamar Gregory Date of procedure: 08/03/2020- Monday Time to register at [place] Stay: You will receive a call from the hospital a few days before your procedure. Provider:  Dr. Abbey Chatters   Purchase:  MIRALAX 238 gram bottle, 1 FLEET ENEMA, 1 box of DULCOLAX (All over the counter medications)    08/01/2020- 2 Days prior to procedure:  START CLEAR LIQUID DIET AFTER YOUR LUNCH MEAL--NO SOLID FOODS!   08/02/2020- 1 Day prior to procedure:   CLEAR LIQUIDS ALL DAY--NO SOLID FOODS!   Diabetic medication adjustments for today:  n/a  At 10:00 AM, take 2 DULCOLAX 26m tablets   At 12:00 PM, Mix 5 teaspoons of Miralax in any 4-6 ounces of CLEAR LIQUIDS (Gatorade) every hour for 5 hours until passing clear, watery stools. Be sure to drink 4 ounces of clear liquid 30 minutes after each dose of Miralax.   At 3:00 PM, take 2 Dulcolax 510mtablets   If stools are not clear & watery by 6:00 PM, take 5 teaspoons of Miralax every 30 minutes until stools are clear (no color)    You must have a complete prep to ensure the most effective cleaning.   CONTINUE CLEAR LIQUIDS ONLY UNTIL MIDNIGHT.  Make a conscious effort to drink as much as you can before, during & after the preparation.     NOTHING TO EAT OR DRINK AFTER MIDNIGHT except for your heart, blood pressure & breathing medications.  You may take them with a sip of clear liquids.    08/03/2020-  Day of Procedure  Give yourself one Fleet enema about 1 hour prior to leaving for the hospital.   Diabetic medication adjustments for today: n/a  You may take TYLENOL products.  Please continue your regular medications unless we have instructed you otherwise.     Please note, on the day of your procedure you MUST be accompanied by an adult who is willing to assume responsibility for  you at time of discharge. If you do not have such person with you, your procedure will have to be rescheduled.                                                                                                                        Please leave ALL jewelry at home prior to coming to the hospital for your procedure.   *It is your responsibility to check with your insurance company for the benefits of coverage you have for this procedure. Unfortunately, not all insurance companies have benefits to cover all or part of these types of procedures. It is your responsibility to check your benefits, however we will be glad to assist you with any codes your insurance company may need.   Please note that most insurance companies will not cover a screening colonoscopy for people  under the age of 5. For example, with some insurance companies you may have benefits for a screening colonoscopy, but if polyps are found the diagnosis will change and then you may have a deductible that will need to be met. Please make sure you check your benefits for screening colonoscopy as well as a diagnostic colonoscopy.    CLEAR LIQUIDS: (NO RED)  Jello Apple Juice White Grape Juice Water  Banana popsicles Kool-Aid Coffee(No cream or milk)  Tea (No cream or milk) Soft drinks Broth (fat free beef/chicken/vegetable)   Clear liquids allow you to see your fingers on the other side of the glass.  Be sure they are NOT RED in color, cloudy, but CLEAR.   Do Not Eat:  Dairy products of any kind Cranberry juice  Tomato or V8 Juice Orange Juice  Grapefruit Juice Red Grape Juice  Solid foods like cereal, oatmeal, yogurt, fruits, vegetables, creamed soups, eggs, bread, etc   HELPFUL HINTS TO MAKE DRINKING EASIER:  -Make sure prep is extremely COLD.  Refrigerate the night before.  You may also put in freezer.  -You may try mixing Crystal Light or Country Time Lemonade if you prefer.  MIx in small amounts.  Add more if necessary.   -Trying drinking through a straw.  -Rinse mouth with water or mouthwash between glasses to remove aftertaste.  -Try sipping on a cold beverage/ice popsicles between glasses of prep.  -Place a piece of sugar-free hard candy in mouth between glasses.  -If you become nauseated, try consuming smaller amounts or stretch out the time between glasses.  Stop for 30 minutes to an hour & slowly start back drinking.   Call our office with any questions or concerns at [phone].   Thank You

## 2020-07-15 NOTE — Progress Notes (Signed)
Ok to schedule.  ASA II 

## 2020-07-16 ENCOUNTER — Other Ambulatory Visit: Payer: Self-pay | Admitting: *Deleted

## 2020-07-28 ENCOUNTER — Encounter (HOSPITAL_COMMUNITY): Payer: Self-pay

## 2020-07-31 ENCOUNTER — Telehealth (INDEPENDENT_AMBULATORY_CARE_PROVIDER_SITE_OTHER): Payer: Self-pay | Admitting: Gastroenterology

## 2020-07-31 ENCOUNTER — Other Ambulatory Visit (HOSPITAL_COMMUNITY)
Admission: RE | Admit: 2020-07-31 | Discharge: 2020-07-31 | Disposition: A | Discharge status: Home / Self Care | Payer: Commercial Managed Care - PPO | Source: Ambulatory Visit | Attending: Internal Medicine | Admitting: Internal Medicine

## 2020-07-31 ENCOUNTER — Other Ambulatory Visit: Payer: Self-pay

## 2020-07-31 DIAGNOSIS — Z20822 Contact with and (suspected) exposure to covid-19: Secondary | ICD-10-CM | POA: Insufficient documentation

## 2020-07-31 DIAGNOSIS — Z01812 Encounter for preprocedural laboratory examination: Secondary | ICD-10-CM | POA: Insufficient documentation

## 2020-07-31 DIAGNOSIS — E876 Hypokalemia: Secondary | ICD-10-CM

## 2020-07-31 LAB — BASIC METABOLIC PANEL
Anion gap: 11 (ref 5–15)
BUN: 16 mg/dL (ref 8–23)
CO2: 27 mmol/L (ref 22–32)
Calcium: 8.9 mg/dL (ref 8.9–10.3)
Chloride: 100 mmol/L (ref 98–111)
Creatinine, Ser: 0.93 mg/dL (ref 0.44–1.00)
GFR, Estimated: >60 mL/min (ref >60)
Glucose, Bld: 102 mg/dL — ABNORMAL HIGH (ref 70–99)
Potassium: 2.7 mmol/L — CL (ref 3.5–5.1)
Sodium: 138 mmol/L (ref 135–145)

## 2020-07-31 MED ORDER — POTASSIUM CHLORIDE CRYS ER 20 MEQ PO TBCR: 60.0000 meq | EXTENDED_RELEASE_TABLET | Freq: Every day | ORAL | 0 refills | Status: DC

## 2020-07-31 NOTE — Telephone Encounter (Signed)
Received a call from the lab regarding critical value with a potassium of 2.7 from labs drawn today.  I tried reaching the patient to discuss these findings but she did not pick up the phone, I level detailed voice message explained that I will send potassium chloride supplementation for next 2 days 60 mEq orally.  Prescription was sent to pharmacy.  Maylon Peppers, MD Gastroenterology and Hepatology [facility]

## 2020-08-01 LAB — SARS CORONAVIRUS 2 (TAT 6-24 HRS): SARS Coronavirus 2: NEGATIVE

## 2020-08-03 ENCOUNTER — Ambulatory Visit (HOSPITAL_COMMUNITY): Payer: Commercial Managed Care - PPO | Admitting: Anesthesiology

## 2020-08-03 ENCOUNTER — Encounter (HOSPITAL_COMMUNITY): Payer: Self-pay

## 2020-08-03 ENCOUNTER — Ambulatory Visit (HOSPITAL_COMMUNITY)
Admission: RE | Admit: 2020-08-03 | Discharge: 2020-08-03 | Disposition: A | Discharge status: Home / Self Care | Payer: Commercial Managed Care - PPO | Attending: Internal Medicine | Admitting: Internal Medicine

## 2020-08-03 ENCOUNTER — Other Ambulatory Visit: Payer: Self-pay

## 2020-08-03 ENCOUNTER — Encounter (HOSPITAL_COMMUNITY)
Admission: RE | Disposition: A | Discharge status: Home / Self Care | Payer: Self-pay | Source: Home / Self Care | Attending: Internal Medicine

## 2020-08-03 DIAGNOSIS — K648 Other hemorrhoids: Secondary | ICD-10-CM | POA: Insufficient documentation

## 2020-08-03 DIAGNOSIS — K5730 Diverticulosis of large intestine without perforation or abscess without bleeding: Secondary | ICD-10-CM | POA: Insufficient documentation

## 2020-08-03 DIAGNOSIS — Z882 Allergy status to sulfonamides status: Secondary | ICD-10-CM | POA: Diagnosis not present

## 2020-08-03 DIAGNOSIS — Z85828 Personal history of other malignant neoplasm of skin: Secondary | ICD-10-CM | POA: Diagnosis not present

## 2020-08-03 DIAGNOSIS — Z880 Allergy status to penicillin: Secondary | ICD-10-CM | POA: Diagnosis not present

## 2020-08-03 DIAGNOSIS — Z1211 Encounter for screening for malignant neoplasm of colon: Secondary | ICD-10-CM

## 2020-08-03 DIAGNOSIS — Z79899 Other long term (current) drug therapy: Secondary | ICD-10-CM | POA: Diagnosis not present

## 2020-08-03 DIAGNOSIS — K529 Noninfective gastroenteritis and colitis, unspecified: Secondary | ICD-10-CM | POA: Insufficient documentation

## 2020-08-03 DIAGNOSIS — Z87891 Personal history of nicotine dependence: Secondary | ICD-10-CM | POA: Insufficient documentation

## 2020-08-03 DIAGNOSIS — Z885 Allergy status to narcotic agent status: Secondary | ICD-10-CM | POA: Diagnosis not present

## 2020-08-03 HISTORY — PX: BIOPSY: SHX5522

## 2020-08-03 HISTORY — PX: COLONOSCOPY WITH PROPOFOL: SHX5780

## 2020-08-03 SURGERY — COLONOSCOPY WITH PROPOFOL
Anesthesia: General

## 2020-08-03 MED ORDER — CHLORHEXIDINE GLUCONATE CLOTH 2 % EX PADS: 6.0000 | MEDICATED_PAD | Freq: Once | CUTANEOUS | Status: DC

## 2020-08-03 MED ORDER — PROPOFOL 500 MG/50ML IV EMUL
INTRAVENOUS | Status: DC | PRN
  Administered 2020-08-03: 125 ug/kg/min via INTRAVENOUS

## 2020-08-03 MED ORDER — LACTATED RINGERS IV SOLN: INTRAVENOUS | Status: DC

## 2020-08-03 MED ORDER — PROPOFOL 10 MG/ML IV BOLUS
INTRAVENOUS | Status: DC | PRN
  Administered 2020-08-03: 100 mg via INTRAVENOUS
  Administered 2020-08-03: 30 mg via INTRAVENOUS
  Administered 2020-08-03: 20 mg via INTRAVENOUS
  Administered 2020-08-03: 30 mg via INTRAVENOUS

## 2020-08-03 MED ORDER — LIDOCAINE HCL (CARDIAC) PF 100 MG/5ML IV SOSY
PREFILLED_SYRINGE | INTRAVENOUS | Status: DC | PRN
  Administered 2020-08-03: 50 mg via INTRAVENOUS

## 2020-08-03 NOTE — Anesthesia Procedure Notes (Signed)
Date/Time: 08/03/2020 11:51 AM Performed by: Orlie Dakin, CRNA Pre-anesthesia Checklist: Patient identified, Emergency Drugs available, Suction available and Patient being monitored Patient Re-evaluated:Patient Re-evaluated prior to induction Oxygen Delivery Method: Nasal cannula Induction Type: IV induction Placement Confirmation: positive ETCO2

## 2020-08-03 NOTE — Telephone Encounter (Signed)
Thanks for taking care of this.

## 2020-08-03 NOTE — H&P (Signed)
Primary Care Physician:  Erven Colla, DO Primary Gastroenterologist:  Dr. Abbey Chatters  Pre-Procedure History & Physical: HPI:  Krystal Silva is a 65 y.o. female is here for a colonoscopy for colon cancer screening purposes. Last TCS 08/21/2009 done by Dr. Oneida Alar, hyperplastic polyp, benign colonic mucosa Patient denies any family history of colorectal cancer.  No melena or hematochezia.  No abdominal pain or unintentional weight loss.  No change in bowel habits.  Overall feels well from a GI standpoint.  Past Medical History:  Diagnosis Date  . Allergy   . Basal cell carcinoma 09/04/2019   right parotid area - Refer to [facility]  . Diverticulitis   . Hypercholesteremia   . Hypertension   . Insomnia   . MS (multiple sclerosis) ([place])   . Vitamin D deficiency     Past Surgical History:  Procedure Laterality Date  . COLONOSCOPY  08/21/2009  . KNEE ARTHROSCOPY WITH MEDIAL MENISECTOMY Right 03/27/2015   Procedure: KNEE ARTHROSCOPY WITH PARTIAL MEDIAL MENISECTOMY AND MICROFRACTURE;  Surgeon: Carole Civil, MD;  Location: [facility];  Service: Orthopedics;  Laterality: Right;  . TONSILLECTOMY    . WISDOM TOOTH EXTRACTION  1980's    Prior to Admission medications   Medication Sig Start Date End Date Taking? Authorizing Provider  AVONEX PEN 30 MCG/0.5ML AJKT INJECT 30MCG  INTRAMUSCULARLY WEEKLY Patient taking differently: Inject 30 mcg as directed every Saturday. 05/19/20  Yes Sater, Nanine Means, MD  baclofen (LIORESAL) 10 MG tablet TAKE 1 TABLET 3 TIMES DAILY AS NEEDED. Patient taking differently: Take 10 mg by mouth at bedtime. 10/22/19  Yes Sater, Nanine Means, MD  cetirizine (ZYRTEC) 10 MG tablet Take 10 mg by mouth daily as needed for allergies.   Yes [provider]  Cholecalciferol (VITAMIN D-3) 125 MCG (5000 UT) TABS Take 1 tablet by mouth daily. Patient taking differently: Take 5,000 Units by mouth daily. 12/09/19  Yes Lovena Le, Malena M, DO  diphenhydrAMINE (BENADRYL) 25 MG tablet Take  25 mg by mouth at bedtime as needed for allergies.   Yes [provider]  Gluc-Chonn-MSM-Boswellia-Vit D (GLUCOSAMINE CHONDROITIN + D3 PO) Take 1 tablet by mouth daily.   Yes [provider]  ibuprofen (ADVIL) 200 MG tablet Take 400 mg by mouth every 6 (six) hours as needed for headache or moderate pain.   Yes [provider]  imipramine (TOFRANIL) 25 MG tablet Take 50 mg by mouth at bedtime.   Yes [provider]  Lidocaine (BLUE-EMU PAIN RELIEF DRY EX) Apply 1 application topically daily.   Yes [provider]  losartan-hydrochlorothiazide (HYZAAR) 100-12.5 MG tablet Take one tablet by mouth every day for blood pressure Patient taking differently: Take 1 tablet by mouth daily. 06/10/20  Yes Lovena Le, Malena M, DO  potassium chloride (KLOR-CON M10) 10 MEQ tablet Take 1 tablet (10 mEq total) by mouth daily. 06/10/20  Yes Lovena Le, Malena M, DO  potassium chloride SA (KLOR-CON) 20 MEQ tablet Take 3 tablets (60 mEq total) by mouth daily for 3 days. 07/31/20 08/03/20 Yes Harvel Quale, MD  Probiotic Product (PROBIOTIC DAILY PO) Take 4 capsules by mouth daily.   Yes [provider]  sertraline (ZOLOFT) 100 MG tablet Take 1 tablet (100 mg total) by mouth daily. Patient taking differently: Take 100 mg by mouth at bedtime. 06/10/20  Yes Lovena Le, Malena M, DO  simvastatin (ZOCOR) 40 MG tablet Take one tablet daily at bedtime Patient taking differently: Take 40 mg by mouth at bedtime. 06/10/20  Yes  Lovena Le, Malena M, DO    Allergies as of 07/15/2020 - Review Complete 07/14/2020  Allergen Reaction Noted  . Cefzil [cefprozil] Diarrhea 03/31/2019  . Codeine    . Penicillins  08/11/2009  . Sulfonamide derivatives    . Sulfamethoxazole Rash 08/22/2014    Family History  Problem Relation Age of Onset  . Hypertension Mother   . Hyperlipidemia Father   . Hearing loss Father   . Hyperlipidemia Brother   . Diabetes Brother   . Vision loss Maternal  Grandmother   . Vision loss Paternal Grandmother   . Cancer Other        breast    Social History   Socioeconomic History  . Marital status: Divorced    Spouse name: Not on file  . Number of children: Not on file  . Years of education: 63  . Highest education level: Not on file  Occupational History  . Occupation: work   Tobacco Use  . Smoking status: Former Smoker    Packs/day: 1.50    Years: 30.00    Pack years: 45.00    Types: Cigarettes    Start date: 12/26/1974    Quit date: 06/22/2004    Years since quitting: 16.1  . Smokeless tobacco: Never Used  Vaping Use  . Vaping Use: Never used  Substance and Sexual Activity  . Alcohol use: Yes    Alcohol/week: 1.0 standard drink    Types: 1 Cans of beer per week    Comment: rare social  . Drug use: No  . Sexual activity: Not Currently  Other Topics Concern  . Not on file  Social History Narrative   Coffee in the morning couple of cups    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[employer]   Social Determinants of Health   Financial Resource Strain: Not on file  Food Insecurity: Not on file  Transportation Needs: Not on file  Physical Activity: Not on file  Stress: Not on file  Social Connections: Not on file  Intimate Partner Violence: Not on file    Review of Systems: See HPI, otherwise negative ROS  Physical Exam: Vital signs in last 24 hours:     General:   Alert,  Well-developed, well-nourished, pleasant and cooperative in NAD Head:  Normocephalic and atraumatic. Eyes:  Sclera clear, no icterus.   Conjunctiva pink. Ears:  Normal auditory acuity. Nose:  No deformity, discharge,  or lesions. Mouth:  No deformity or lesions, dentition normal. Neck:  Supple; no masses or thyromegaly. Lungs:  Clear throughout to auscultation.   No wheezes, crackles, or rhonchi. No acute distress. Heart:  Regular rate and rhythm; no murmurs, clicks, rubs,  or gallops. Abdomen:  Soft, nontender and  nondistended. No masses, hepatosplenomegaly or hernias noted. Normal bowel sounds, without guarding, and without rebound.   Msk:  Symmetrical without gross deformities. Normal posture. Extremities:  Without clubbing or edema. Neurologic:  Alert and  oriented x4;  grossly normal neurologically. Skin:  Intact without significant lesions or rashes. Cervical Nodes:  No significant cervical adenopathy. Psych:  Alert and cooperative. Normal mood and affect.  Impression/Plan: Krystal Silva is here for a colonoscopy to be performed for colon cancer screening purposes.  The risks of the procedure including infection, bleed, or perforation as well as benefits, limitations, alternatives and imponderables have been reviewed with the patient. Questions have been answered. All parties agreeable.

## 2020-08-03 NOTE — Transfer of Care (Signed)
Immediate Anesthesia Transfer of Care Note  Patient: Krystal Silva [facility]  Procedure(s) Performed: COLONOSCOPY WITH PROPOFOL (N/A ) BIOPSY  Patient Location: PACU  Anesthesia Type:General  Level of Consciousness: awake and alert   Airway & Oxygen Therapy: Patient Spontanous Breathing  Post-op Assessment: Report given to RN and Post -op Vital signs reviewed and stable  Post vital signs: Reviewed and stable  Last Vitals:  Vitals Value Taken Time  BP    Temp    Pulse    Resp    SpO2      Last Pain:  Vitals:   08/03/20 1149  TempSrc:   PainSc: 0-No pain      Patients Stated Pain Goal: 5 (14/64/31 4276)  Complications: No complications documented.

## 2020-08-03 NOTE — Anesthesia Preprocedure Evaluation (Signed)
Anesthesia Evaluation  Patient identified by MRN, date of birth, ID band Patient awake    Reviewed: Allergy & Precautions, NPO status , Patient's Chart, lab work & pertinent test results  Airway Mallampati: II  TM Distance: >3 FB Neck ROM: Full    Dental  (+) Dental Advisory Given,    Pulmonary former smoker,    Pulmonary exam normal breath sounds clear to auscultation       Cardiovascular Exercise Tolerance: Good hypertension, Pt. on medications Normal cardiovascular exam Rhythm:Regular Rate:Normal     Neuro/Psych PSYCHIATRIC DISORDERS Anxiety Depression    GI/Hepatic negative GI ROS, Neg liver ROS,   Endo/Other  negative endocrine ROS  Renal/GU      Musculoskeletal  (+) Arthritis , Osteoarthritis,    Abdominal   Peds  Hematology negative hematology ROS (+)   Anesthesia Other Findings   Reproductive/Obstetrics                             Anesthesia Physical Anesthesia Plan  ASA: II  Anesthesia Plan: General   Post-op Pain Management:    Induction: Intravenous  PONV Risk Score and Plan: TIVA  Airway Management Planned: Nasal Cannula and Natural Airway  Additional Equipment:   Intra-op Plan:   Post-operative Plan:   Informed Consent: I have reviewed the patients History and Physical, chart, labs and discussed the procedure including the risks, benefits and alternatives for the proposed anesthesia with the patient or authorized representative who has indicated his/her understanding and acceptance.     Dental advisory given  Plan Discussed with: CRNA and Surgeon  Anesthesia Plan Comments:         Anesthesia Quick Evaluation

## 2020-08-03 NOTE — Discharge Instructions (Addendum)
Colonoscopy Discharge Instructions  Read the instructions outlined below and refer to this sheet in the next few weeks. These discharge instructions provide you with general information on caring for yourself after you leave the hospital. Your doctor may also give you specific instructions. While your treatment has been planned according to the most current medical practices available, unavoidable complications occasionally occur.   ACTIVITY  You may resume your regular activity, but move at a slower pace for the next 24 hours.   Take frequent rest periods for the next 24 hours.   Walking will help get rid of the air and reduce the bloated feeling in your belly (abdomen).   No driving for 24 hours (because of the medicine (anesthesia) used during the test).    Do not sign any important legal documents or operate any machinery for 24 hours (because of the anesthesia used during the test).  NUTRITION  Drink plenty of fluids.   You may resume your normal diet as instructed by your doctor.   Begin with a light meal and progress to your normal diet. Heavy or fried foods are harder to digest and may make you feel sick to your stomach (nauseated).   Avoid alcoholic beverages for 24 hours or as instructed.  MEDICATIONS  You may resume your normal medications unless your doctor tells you otherwise.  WHAT YOU CAN EXPECT TODAY  Some feelings of bloating in the abdomen.   Passage of more gas than usual.   Spotting of blood in your stool or on the toilet paper.  IF YOU HAD POLYPS REMOVED DURING THE COLONOSCOPY:  No aspirin products for 7 days or as instructed.   No alcohol for 7 days or as instructed.   Eat a soft diet for the next 24 hours.  FINDING OUT THE RESULTS OF YOUR TEST Not all test results are available during your visit. If your test results are not back during the visit, make an appointment with your caregiver to find out the results. Do not assume everything is normal if  you have not heard from your caregiver or the medical facility. It is important for you to follow up on all of your test results.  SEEK IMMEDIATE MEDICAL ATTENTION IF:  You have more than a spotting of blood in your stool.   Your belly is swollen (abdominal distention).   You are nauseated or vomiting.   You have a temperature over 101.   You have abdominal pain or discomfort that is severe or gets worse throughout the day.   Your colonoscopy was unremarkable for the most part.  I did not find any polyps or evidence of colon cancer.  I did not find any inflammation indicative of underlying ulcerative colitis or Crohn's disease.  I did take biopsies throughout your colon to rule out a condition called microscopic colitis that can cause chronic diarrhea especially in women.  We should have these results back hopefully by this week.  Recommend repeating colonoscopy in 10 years for screening purposes.  You also have diverticulosis and internal hemorrhoids. I would recommend increasing fiber in your diet or adding OTC Benefiber/Metamucil. Be sure to drink at least 4 to 6 glasses of water daily. Follow-up with GI in 3 months to discuss your diarrhea.    I hope you have a great rest of your week!  Elon Alas. Abbey Chatters, D.O. Gastroenterology and Hepatology [facility]   Diverticulosis  Diverticulosis is a condition that develops when small pouches (diverticula) form in  the wall of the large intestine (colon). The colon is where water is absorbed and stool (feces) is formed. The pouches form when the inside layer of the colon pushes through weak spots in the outer layers of the colon. You may have a few pouches or many of them. The pouches usually do not cause problems unless they become inflamed or infected. When this happens, the condition is called diverticulitis. What are the causes? The cause of this condition is not known. What increases the risk? The following  factors may make you more likely to develop this condition:  Being older than age 58. Your risk for this condition increases with age. Diverticulosis is rare among people younger than age 50. By age 52, many people have it.  Eating a low-fiber diet.  Having frequent constipation.  Being overweight.  Not getting enough exercise.  Smoking.  Taking over-the-counter pain medicines, like aspirin and ibuprofen.  Having a family history of diverticulosis. What are the signs or symptoms? In most people, there are no symptoms of this condition. If you do have symptoms, they may include:  Bloating.  Cramps in the abdomen.  Constipation or diarrhea.  Pain in the lower left side of the abdomen. How is this diagnosed? Because diverticulosis usually has no symptoms, it is most often diagnosed during an exam for other colon problems. The condition may be diagnosed by:  Using a flexible scope to examine the colon (colonoscopy).  Taking an X-ray of the colon after dye has been put into the colon (barium enema).  Having a CT scan. How is this treated? You may not need treatment for this condition. Your health care provider may recommend treatment to prevent problems. You may need treatment if you have symptoms or if you previously had diverticulitis. Treatment may include:  Eating a high-fiber diet.  Taking a fiber supplement.  Taking a live bacteria supplement (probiotic).  Taking medicine to relax your colon.   Follow these instructions at home: Medicines  Take over-the-counter and prescription medicines only as told by your health care provider.  If told by your health care provider, take a fiber supplement or probiotic. Constipation prevention Your condition may cause constipation. To prevent or treat constipation, you may need to:  Drink enough fluid to keep your urine pale yellow.  Take over-the-counter or prescription medicines.  Eat foods that are high in fiber, such  as beans, whole grains, and fresh fruits and vegetables.  Limit foods that are high in fat and processed sugars, such as fried or sweet foods.   General instructions  Try not to strain when you have a bowel movement.  Keep all follow-up visits as told by your health care provider. This is important. Contact a health care provider if you:  Have pain in your abdomen.  Have bloating.  Have cramps.  Have not had a bowel movement in 3 days. Get help right away if:  Your pain gets worse.  Your bloating becomes very bad.  You have a fever or chills, and your symptoms suddenly get worse.  You vomit.  You have bowel movements that are bloody or black.  You have bleeding from your rectum. Summary  Diverticulosis is a condition that develops when small pouches (diverticula) form in the wall of the large intestine (colon).  You may have a few pouches or many of them.  This condition is most often diagnosed during an exam for other colon problems.  Treatment may include increasing the fiber in your  diet, taking supplements, or taking medicines. This information is not intended to replace advice given to you by your health care provider. Make sure you discuss any questions you have with your health care provider. Document Revised: 12/06/2018 Document Reviewed: 12/06/2018 Elsevier Patient Education  [place].

## 2020-08-03 NOTE — Anesthesia Postprocedure Evaluation (Signed)
Anesthesia Post Note  Patient: Krystal Silva [facility]  Procedure(s) Performed: COLONOSCOPY WITH PROPOFOL (N/A ) BIOPSY  Patient location during evaluation: Phase II Anesthesia Type: General Level of consciousness: awake and alert Pain management: satisfactory to patient Vital Signs Assessment: post-procedure vital signs reviewed and stable Respiratory status: spontaneous breathing and respiratory function stable Cardiovascular status: blood pressure returned to baseline and stable Postop Assessment: no apparent nausea or vomiting Anesthetic complications: no   No complications documented.   Last Vitals:  Vitals:   08/03/20 1113  BP: (!) 166/75  Pulse: (!) 107  Resp: 20  Temp: 36.8 C  SpO2: 98%    Last Pain:  Vitals:   08/03/20 1149  TempSrc:   PainSc: 0-No pain                 Karna Dupes

## 2020-08-03 NOTE — Op Note (Signed)
[facility] Patient Name: Krystal Silva Procedure Date: 08/03/2020 11:23 AM MRN: [id number] Date of Birth: [birth year] Attending MD: Elon Alas. Abbey Chatters DO CSN: [id number] Age: 65 Admit Type: Outpatient Procedure:                Colonoscopy Indications:              Screening for colorectal malignant neoplasm Providers:                Elon Alas. Abbey Chatters, DO, Gwynneth Albright RN, RN,                            Nelma Rothman, Technician Referring MD:              Medicines:                See the Anesthesia note for documentation of the                            administered medications Complications:            No immediate complications. Estimated Blood Loss:     Estimated blood loss was minimal. Procedure:                Pre-Anesthesia Assessment:                           - The anesthesia plan was to use monitored                            anesthesia care (MAC).                           After obtaining informed consent, the colonoscope                            was passed under direct vision. Throughout the                            procedure, the patient's blood pressure, pulse, and                            oxygen saturations were monitored continuously. The                            [id number]-[id number] ([id number]) scope was introduced through                            the anus and advanced to the the cecum, identified                            by appendiceal orifice and ileocecal valve. The                            colonoscopy was performed without difficulty. The                            patient tolerated the procedure  well. The quality                            of the bowel preparation was evaluated using the                            BBPS [facility] Bowel Preparation Scale) with scores                            of: Right Colon = 2 (minor amount of residual                            staining, small fragments of stool and/or opaque                            liquid, but  mucosa seen well), Transverse Colon = 3                            (entire mucosa seen well with no residual staining,                            small fragments of stool or opaque liquid) and Left                            Colon = 3 (entire mucosa seen well with no residual                            staining, small fragments of stool or opaque                            liquid). The total BBPS score equals 8. The quality                            of the bowel preparation was good. Scope In: 29:93:71 AM Scope Out: 12:10:37 PM Scope Withdrawal Time: 0 hours 9 minutes 34 seconds  Total Procedure Duration: 0 hours 14 minutes 20 seconds  Findings:      The perianal and digital rectal examinations were normal.      Non-bleeding internal hemorrhoids were found during endoscopy.      Many small and large-mouthed diverticula were found in the sigmoid       colon, descending colon and transverse colon.      Biopsies for histology were taken with a cold forceps from the ascending       colon, transverse colon and descending colon for evaluation of       microscopic colitis.      The exam was otherwise without abnormality. Impression:               - Non-bleeding internal hemorrhoids.                           - Diverticulosis in the sigmoid colon, in the  descending colon and in the transverse colon.                           - The examination was otherwise normal.                           - Biopsies were taken with a cold forceps from the                            ascending colon, transverse colon and descending                            colon for evaluation of microscopic colitis. Moderate Sedation:      Per Anesthesia Care Recommendation:           - Patient has a contact number available for                            emergencies. The signs and symptoms of potential                            delayed complications were discussed with the                             patient. Return to normal activities tomorrow.                            Written discharge instructions were provided to the                            patient.                           - Resume previous diet.                           - Continue present medications.                           - Await pathology results.                           - Repeat colonoscopy in 10 years for screening                            purposes.                           - Return to GI clinic in 3 months. Procedure Code(s):        --- Professional ---                           [id number], Colonoscopy, flexible; with biopsy, single                            or multiple Diagnosis Code(s):        ---  Professional ---                           Z12.11, Encounter for screening for malignant                            neoplasm of colon                           K64.8, Other hemorrhoids                           K57.30, Diverticulosis of large intestine without                            perforation or abscess without bleeding CPT copyright 2019 American Medical Association. All rights reserved. The codes documented in this report are preliminary and upon coder review may  be revised to meet current compliance requirements. Elon Alas. Abbey Chatters, DO No Name Abbey Chatters, DO 08/03/2020 12:14:24 PM This report has been signed electronically. Number of Addenda: 0

## 2020-08-03 NOTE — Telephone Encounter (Signed)
No problem.

## 2020-08-04 LAB — POCT I-STAT, CHEM 8
BUN: 9 mg/dL (ref 8–23)
Calcium, Ion: 1.25 mmol/L (ref 1.15–1.40)
Chloride: 107 mmol/L (ref 98–111)
Creatinine, Ser: 0.8 mg/dL (ref 0.44–1.00)
Glucose, Bld: 127 mg/dL — ABNORMAL HIGH (ref 70–99)
HCT: 44 % (ref 36.0–46.0)
Hemoglobin: 15 g/dL (ref 12.0–15.0)
Potassium: 3.1 mmol/L — ABNORMAL LOW (ref 3.5–5.1)
Sodium: 142 mmol/L (ref 135–145)
TCO2: 22 mmol/L (ref 22–32)

## 2020-08-05 LAB — SURGICAL PATHOLOGY

## 2020-08-10 ENCOUNTER — Encounter (HOSPITAL_COMMUNITY): Payer: Self-pay | Admitting: Internal Medicine

## 2020-08-11 ENCOUNTER — Other Ambulatory Visit: Payer: Self-pay

## 2020-08-11 DIAGNOSIS — K5732 Diverticulitis of large intestine without perforation or abscess without bleeding: Secondary | ICD-10-CM

## 2020-08-11 DIAGNOSIS — K581 Irritable bowel syndrome with constipation: Secondary | ICD-10-CM

## 2020-08-22 ENCOUNTER — Ambulatory Visit
Admission: RE | Admit: 2020-08-22 | Discharge: 2020-08-22 | Disposition: A | Discharge status: Home / Self Care | Payer: Commercial Managed Care - PPO | Source: Ambulatory Visit | Attending: Emergency Medicine | Admitting: Emergency Medicine

## 2020-08-22 ENCOUNTER — Other Ambulatory Visit: Payer: Self-pay

## 2020-08-22 VITALS — BP 137/75 | HR 105 | Temp 98.2°F | Resp 18

## 2020-08-22 DIAGNOSIS — Z87891 Personal history of nicotine dependence: Secondary | ICD-10-CM | POA: Insufficient documentation

## 2020-08-22 DIAGNOSIS — R197 Diarrhea, unspecified: Secondary | ICD-10-CM | POA: Diagnosis present

## 2020-08-22 DIAGNOSIS — K529 Noninfective gastroenteritis and colitis, unspecified: Secondary | ICD-10-CM | POA: Insufficient documentation

## 2020-08-22 DIAGNOSIS — R112 Nausea with vomiting, unspecified: Secondary | ICD-10-CM | POA: Diagnosis present

## 2020-08-22 DIAGNOSIS — Z79899 Other long term (current) drug therapy: Secondary | ICD-10-CM | POA: Insufficient documentation

## 2020-08-22 MED ORDER — ONDANSETRON HCL 4 MG PO TABS: 4.0000 mg | ORAL_TABLET | Freq: Four times a day (QID) | ORAL | 0 refills | Status: DC

## 2020-08-22 NOTE — Discharge Instructions (Signed)
GI panel ordered.  We will follow up with you regarding abnormal results.  Get rest and drink fluids Zofran prescribed.  Take as directed.    DIET Instructions:  30 minutes after taking nausea medicine, begin with sips of clear liquids. If able to hold down 2 - 4 ounces for 30 minutes, begin drinking more. Increase your fluid intake to replace losses. Clear liquids only for 24 hours (water, tea, sport drinks, clear flat ginger ale or cola and juices, broth, jello, popsicles, ect). Advance to bland foods, applesauce, rice, baked or boiled chicken, ect. Avoid milk, greasy foods and anything that doesn't agree with you.  If you experience new or worsening symptoms return or go to ER such as fever, chills, nausea, vomiting, diarrhea, bloody or dark tarry stools, constipation, urinary symptoms, worsening abdominal discomfort, symptoms that do not improve with medications, inability to keep fluids down, etc..[name]

## 2020-08-22 NOTE — ED Triage Notes (Signed)
diarrhea x a few weeks.  Has done a stool test with her doctor previously.  Vomiting as well.

## 2020-08-22 NOTE — ED Provider Notes (Signed)
[place]   [id number] 08/22/20 Arrival Time: 7253  CC: nausea, vomiting, diarrhea  SUBJECTIVE:  Krystal Silva is a 65 y.o. female who presents with complaint of nausea, vomiting (x apx 5-8 episodes daily) and profuse watery diarrhea (x apx 9 episodes daily) for the past 2-3 weeks.  Speculates she may have ate some bad food prior to symptoms.  Was tested for c. Diff and was negative.  Has tried OTC medications without relief.  Worse with eating and drinking, but has tolerated fruit juice and small amounts of water.    Denies fever, chills,  chest pain, SOB, constipation, hematochezia, melena, dysuria, difficulty urinating, increased frequency or urgency, flank pain, loss of bowel or bladder function.  No LMP recorded. Patient is postmenopausal.  ROS: As per HPI.  All other pertinent ROS negative.     Past Medical History:  Diagnosis Date  . Allergy   . Basal cell carcinoma 09/04/2019   right parotid area - Refer to [facility]  . Diverticulitis   . Hypercholesteremia   . Hypertension   . Insomnia   . MS (multiple sclerosis) ([place])   . Vitamin D deficiency    Past Surgical History:  Procedure Laterality Date  . BIOPSY  08/03/2020   Procedure: BIOPSY;  Surgeon: Eloise Harman, DO;  Location: [facility] ENDO SUITE;  Service: Endoscopy;;  . COLONOSCOPY  08/21/2009  . COLONOSCOPY WITH PROPOFOL N/A 08/03/2020   Procedure: COLONOSCOPY WITH PROPOFOL;  Surgeon: Eloise Harman, DO;  Location: [facility] ENDO SUITE;  Service: Endoscopy;  Laterality: N/A;  ASA II / PM procedure  . KNEE ARTHROSCOPY WITH MEDIAL MENISECTOMY Right 03/27/2015   Procedure: KNEE ARTHROSCOPY WITH PARTIAL MEDIAL MENISECTOMY AND MICROFRACTURE;  Surgeon: Carole Civil, MD;  Location: [facility] ORS;  Service: Orthopedics;  Laterality: Right;  . TONSILLECTOMY    . WISDOM TOOTH EXTRACTION  1980's   Allergies  Allergen Reactions  . Cefzil [Cefprozil] Diarrhea  . Codeine Nausea And Vomiting  . Penicillins Rash  . Sulfonamide  Derivatives Rash   No current facility-administered medications on file prior to encounter.   Current Outpatient Medications on File Prior to Encounter  Medication Sig Dispense Refill  . AVONEX PEN 30 MCG/0.5ML AJKT INJECT 30MCG  INTRAMUSCULARLY WEEKLY (Patient taking differently: Inject 30 mcg as directed every Saturday.) 12 each 3  . baclofen (LIORESAL) 10 MG tablet TAKE 1 TABLET 3 TIMES DAILY AS NEEDED. (Patient taking differently: Take 10 mg by mouth at bedtime.) 270 tablet 3  . cetirizine (ZYRTEC) 10 MG tablet Take 10 mg by mouth daily as needed for allergies.    . Cholecalciferol (VITAMIN D-3) 125 MCG (5000 UT) TABS Take 1 tablet by mouth daily. (Patient taking differently: Take 5,000 Units by mouth daily.) 90 tablet 1  . diphenhydrAMINE (BENADRYL) 25 MG tablet Take 25 mg by mouth at bedtime as needed for allergies.    . Gluc-Chonn-MSM-Boswellia-Vit D (GLUCOSAMINE CHONDROITIN + D3 PO) Take 1 tablet by mouth daily.    [name] ibuprofen (ADVIL) 200 MG tablet Take 400 mg by mouth every 6 (six) hours as needed for headache or moderate pain.    [name] imipramine (TOFRANIL) 25 MG tablet Take 50 mg by mouth at bedtime.    . Lidocaine (BLUE-EMU PAIN RELIEF DRY EX) Apply 1 application topically daily.    [name] losartan-hydrochlorothiazide (HYZAAR) 100-12.5 MG tablet Take one tablet by mouth every day for blood pressure (Patient taking differently: Take 1 tablet by mouth daily.) 90 tablet 1  . potassium chloride (  KLOR-CON M10) 10 MEQ tablet Take 1 tablet (10 mEq total) by mouth daily. 90 tablet 1  . potassium chloride SA (KLOR-CON) 20 MEQ tablet Take 3 tablets (60 mEq total) by mouth daily for 3 days. 9 tablet 0  . Probiotic Product (PROBIOTIC DAILY PO) Take 4 capsules by mouth daily.    . sertraline (ZOLOFT) 100 MG tablet Take 1 tablet (100 mg total) by mouth daily. (Patient taking differently: Take 100 mg by mouth at bedtime.) 90 tablet 1  . simvastatin (ZOCOR) 40 MG tablet Take one tablet daily at bedtime  (Patient taking differently: Take 40 mg by mouth at bedtime.) 90 tablet 1   Social History   Socioeconomic History  . Marital status: Divorced    Spouse name: Not on file  . Number of children: Not on file  . Years of education: 20  . Highest education level: Not on file  Occupational History  . Occupation: work   Tobacco Use  . Smoking status: Former Smoker    Packs/day: 1.50    Years: 30.00    Pack years: 45.00    Types: Cigarettes    Start date: 12/26/1974    Quit date: 06/22/2004    Years since quitting: 16.1  . Smokeless tobacco: Never Used  Vaping Use  . Vaping Use: Never used  Substance and Sexual Activity  . Alcohol use: Yes    Alcohol/week: 1.0 standard drink    Types: 1 Cans of beer per week    Comment: rare social  . Drug use: No  . Sexual activity: Not Currently  Other Topics Concern  . Not on file  Social History Narrative   Coffee in the morning couple of cups    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[employer]   Social Determinants of Health   Financial Resource Strain: Not on file  Food Insecurity: Not on file  Transportation Needs: Not on file  Physical Activity: Not on file  Stress: Not on file  Social Connections: Not on file  Intimate Partner Violence: Not on file   Family History  Problem Relation Age of Onset  . Hypertension Mother   . Hyperlipidemia Father   . Hearing loss Father   . Hyperlipidemia Brother   . Diabetes Brother   . Vision loss Maternal Grandmother   . Vision loss Paternal Grandmother   . Cancer Other        breast     OBJECTIVE:  Vitals:   08/22/20 1403  BP: 137/75  Pulse: (!) 105  Resp: 18  Temp: 98.2 F (36.8 C)  TempSrc: Oral  SpO2: 93%    General appearance: Alert; NAD HEENT: NCAT.  Oropharynx clear.  Lungs: clear to auscultation bilaterally without adventitious breath sounds Heart: regular rate and rhythm.  Abdomen: soft, non-distended; normal active bowel sounds;  non-tender to light and deep palpation; nontender at McBurney's point; no guarding Back: no CVA tenderness Extremities: no edema; symmetrical with no gross deformities Skin: warm and dry Neurologic: normal gait Psychological: alert and cooperative; normal mood and affect  ASSESSMENT & PLAN:  1. Nausea vomiting and diarrhea   2. Chronic diarrhea     Meds ordered this encounter  Medications  . ondansetron (ZOFRAN) 4 MG tablet    Sig: Take 1 tablet (4 mg total) by mouth every 6 (six) hours.    Dispense:  20 tablet    Refill:  0    Order Specific Question:  Supervising Provider    Answer:   Raylene Everts [[id number]]    GI panel ordered.  We will follow up with you regarding abnormal results.  Get rest and drink fluids Zofran prescribed.  Take as directed.    DIET Instructions:  30 minutes after taking nausea medicine, begin with sips of clear liquids. If able to hold down 2 - 4 ounces for 30 minutes, begin drinking more. Increase your fluid intake to replace losses. Clear liquids only for 24 hours (water, tea, sport drinks, clear flat ginger ale or cola and juices, broth, jello, popsicles, ect). Advance to bland foods, applesauce, rice, baked or boiled chicken, ect. Avoid milk, greasy foods and anything that doesn't agree with you.  If you experience new or worsening symptoms return or go to ER such as fever, chills, nausea, vomiting, diarrhea, bloody or dark tarry stools, constipation, urinary symptoms, worsening abdominal discomfort, symptoms that do not improve with medications, inability to keep fluids down, etc...  Reviewed expectations re: course of current medical issues. Questions answered. Outlined signs and symptoms indicating need for more acute intervention. Patient verbalized understanding. After Visit Summary given.   Lestine Box, PA-C 08/22/20 1450

## 2020-08-24 LAB — GASTROINTESTINAL PATHOGEN PANEL PCR

## 2020-08-24 LAB — C. DIFFICILE GDH AND TOXIN A/B
GDH ANTIGEN: NOT DETECTED
MICRO NUMBER:: 11707435
SPECIMEN QUALITY:: ADEQUATE
TOXIN A AND B: NOT DETECTED

## 2020-08-25 LAB — GASTROINTESTINAL PANEL BY PCR, STOOL (REPLACES STOOL CULTURE)

## 2020-08-31 ENCOUNTER — Ambulatory Visit (INDEPENDENT_AMBULATORY_CARE_PROVIDER_SITE_OTHER): Payer: Commercial Managed Care - PPO | Admitting: Family Medicine

## 2020-08-31 ENCOUNTER — Encounter: Payer: Self-pay | Admitting: Family Medicine

## 2020-08-31 ENCOUNTER — Other Ambulatory Visit: Payer: Self-pay

## 2020-08-31 VITALS — BP 131/80 | HR 92 | Temp 96.6°F | Ht 64.0 in | Wt 169.0 lb

## 2020-08-31 DIAGNOSIS — E876 Hypokalemia: Secondary | ICD-10-CM

## 2020-08-31 DIAGNOSIS — R197 Diarrhea, unspecified: Secondary | ICD-10-CM | POA: Diagnosis not present

## 2020-08-31 NOTE — Patient Instructions (Signed)
Low-FODMAP Eating Plan  FODMAP stands for fermentable oligosaccharides, disaccharides, monosaccharides, and polyols. These are sugars that are hard for some people to digest. A low-FODMAP eating plan may help some people who have irritable bowel syndrome (IBS) and certain other bowel (intestinal) diseases to manage their symptoms. This meal plan can be complicated to follow. Work with a diet and nutrition specialist (dietitian) to make a low-FODMAP eating plan that is right for you. A dietitian can help make sure that you get enough nutrition from this diet. What are tips for following this plan? Reading food labels  Check labels for hidden FODMAPs such as: ? High-fructose syrup. ? Honey. ? Agave. ? Natural fruit flavors. ? Onion or garlic powder.  Choose low-FODMAP foods that contain 3-4 grams of fiber per serving.  Check food labels for serving sizes. Eat only one serving at a time to make sure FODMAP levels stay low. Shopping  Shop with a list of foods that are recommended on this diet and make a meal plan. Meal planning  Follow a low-FODMAP eating plan for up to 6 weeks, or as told by your health care provider or dietitian.  To follow the eating plan: 1. Eliminate high-FODMAP foods from your diet completely. Choose only low-FODMAP foods to eat. You will do this for 2-6 weeks. 2. Gradually reintroduce high-FODMAP foods into your diet one at a time. Most people should wait a few days before introducing the next new high-FODMAP food into their meal plan. Your dietitian can recommend how quickly you may reintroduce foods. 3. Keep a daily record of what and how much you eat and drink. Make note of any symptoms that you have after eating. 4. Review your daily record with a dietitian regularly to identify which foods you can eat and which foods you should avoid. General tips  Drink enough fluid each day to keep your urine pale yellow.  Avoid processed foods. These often have added sugar  and may be high in FODMAPs.  Avoid most dairy products, whole grains, and sweeteners.  Work with a dietitian to make sure you get enough fiber in your diet.  Avoid high FODMAP foods at meals to manage symptoms. Recommended foods Fruits Bananas, oranges, tangerines, lemons, limes, blueberries, raspberries, strawberries, grapes, cantaloupe, honeydew melon, kiwi, papaya, passion fruit, and pineapple. Limited amounts of dried cranberries, banana chips, and shredded coconut. Vegetables Eggplant, zucchini, cucumber, peppers, green beans, bean sprouts, lettuce, arugula, kale, Swiss chard, spinach, collard greens, bok choy, summer squash, potato, and tomato. Limited amounts of corn, carrot, and sweet potato. Green parts of scallions. Grains Gluten-free grains, such as rice, oats, buckwheat, quinoa, corn, polenta, and millet. Gluten-free pasta, bread, or cereal. Rice noodles. Corn tortillas. Meats and other proteins Unseasoned beef, pork, poultry, or fish. Eggs. Berniece Salines. Tofu (firm) and tempeh. Limited amounts of nuts and seeds, such as almonds, walnuts, [place] nuts, pecans, peanuts, nut butters, pumpkin seeds, chia seeds, and sunflower seeds. Dairy Lactose-free milk, yogurt, and kefir. Lactose-free cottage cheese and ice cream. Non-dairy milks, such as almond, coconut, hemp, and rice milk. Non-dairy yogurt. Limited amounts of goat cheese, brie, mozzarella, parmesan, swiss, and other hard cheeses. Fats and oils Butter-free spreads. Vegetable oils, such as olive, canola, and sunflower oil. Seasoning and other foods Artificial sweeteners with names that do not end in "ol," such as aspartame, saccharine, and stevia. Maple syrup, white table sugar, raw sugar, brown sugar, and molasses. Mayonnaise, soy sauce, and tamari. Fresh basil, coriander, parsley, rosemary, and thyme. Beverages Water and  mineral water. Sugar-sweetened soft drinks. Small amounts of orange juice or cranberry juice. Black and green tea.  Most dry wines. Coffee. The items listed above may not be a complete list of foods and beverages you can eat. Contact a dietitian for more information. Foods to avoid Fruits Fresh, dried, and juiced forms of apple, pear, watermelon, peach, plum, cherries, apricots, blackberries, boysenberries, figs, nectarines, and mango. Avocado. Vegetables Chicory root, artichoke, asparagus, cabbage, snow peas, Brussels sprouts, broccoli, sugar snap peas, mushrooms, celery, and cauliflower. Onions, garlic, leeks, and the white part of scallions. Grains Wheat, including kamut, durum, and semolina. Barley and bulgur. Couscous. Wheat-based cereals. Wheat noodles, bread, crackers, and pastries. Meats and other proteins Fried or fatty meat. Sausage. Cashews and pistachios. Soybeans, baked beans, black beans, chickpeas, kidney beans, fava beans, navy beans, lentils, black-eyed peas, and split peas. Dairy Milk, yogurt, ice cream, and soft cheese. Cream and sour cream. Milk-based sauces. Custard. Buttermilk. Soy milk. Seasoning and other foods Any sugar-free gum or candy. Foods that contain artificial sweeteners such as sorbitol, mannitol, isomalt, or xylitol. Foods that contain honey, high-fructose corn syrup, or agave. Bouillon, vegetable stock, beef stock, and chicken stock. Garlic and onion powder. Condiments made with onion, such as hummus, chutney, pickles, relish, salad dressing, and salsa. Tomato paste. Beverages Chicory-based drinks. Coffee substitutes. Chamomile tea. Fennel tea. Sweet or fortified wines such as port or sherry. Diet soft drinks made with isomalt, mannitol, maltitol, sorbitol, or xylitol. Apple, pear, and mango juice. Juices with high-fructose corn syrup. The items listed above may not be a complete list of foods and beverages you should avoid. Contact a dietitian for more information. Summary  FODMAP stands for fermentable oligosaccharides, disaccharides, monosaccharides, and polyols. These  are sugars that are hard for some people to digest.  A low-FODMAP eating plan is a short-term diet that helps to ease symptoms of certain bowel diseases.  The eating plan usually lasts up to 6 weeks. After that, high-FODMAP foods are reintroduced gradually and one at a time. This can help you find out which foods may be causing symptoms.  A low-FODMAP eating plan can be complicated. It is best to work with a dietitian who has experience with this type of plan. This information is not intended to replace advice given to you by your health care provider. Make sure you discuss any questions you have with your health care provider. Document Revised: 09/26/2019 Document Reviewed: 09/26/2019 Elsevier Patient Education  [place].

## 2020-08-31 NOTE — Progress Notes (Signed)
Patient ID: Krystal Silva, female    DOB: November 11, 1955, 65 y.o.   MRN: [id number]   Chief Complaint  Patient presents with  . Diarrhea   Subjective:    HPI Pt has had diarrhea and vomiting. Has been going on since January. Thought is was food poisoning at first. Happened again in Feb or March because pt kept tasting what she ate. Pt went to Urgent Care on 08/22/20 and had bacterial testing. March 14 pt had colonoscopy. Colon looked inflamed and GI did biopsy. Did stool testing through GI.   Diarrhea- when she eats, has diarrhea.  Vomiting- happening, lasting 4-5x per day, happened in jan, march,  Thought food poisoning, but then it's happening frequently.  Went to urgent care 08/22/20. Stopped after taking zofran and vomiting stopped.  C.diff- negative.  Had colonoscopy- no polyps, microscopic colitis.  Pt has MS and taking a lot of nsaids.  Taking 426m advil prn, and occ naproxen.  Not taking daily. Brought stool samples.   Potassium was low at 2.7.  Told to take 63m for 2 days.   No surgery on abd.   Medical History JoAdelieas a past medical history of Allergy, Basal cell carcinoma (09/04/2019), Diverticulitis, Hypercholesteremia, Hypertension, Insomnia, MS (multiple sclerosis) ([facility] and Vitamin D deficiency.   Outpatient Encounter Medications as of 08/31/2020  Medication Sig  . AVONEX PEN 30 MCG/0.5ML AJKT INJECT 30MCG  INTRAMUSCULARLY WEEKLY (Patient taking differently: Inject 30 mcg as directed every Saturday.)  . baclofen (LIORESAL) 10 MG tablet TAKE 1 TABLET 3 TIMES DAILY AS NEEDED. (Patient taking differently: Take 10 mg by mouth at bedtime.)  . cetirizine (ZYRTEC) 10 MG tablet Take 10 mg by mouth daily as needed for allergies.  . Cholecalciferol (VITAMIN D-3) 125 MCG (5000 UT) TABS Take 1 tablet by mouth daily. (Patient taking differently: Take 5,000 Units by mouth daily.)  . diphenhydrAMINE (BENADRYL) 25 MG tablet Take 25 mg by mouth at bedtime as needed for  allergies.  . Gluc-Chonn-MSM-Boswellia-Vit D (GLUCOSAMINE CHONDROITIN + D3 PO) Take 1 tablet by mouth daily.  . Marland Kitchenbuprofen (ADVIL) 200 MG tablet Take 400 mg by mouth every 6 (six) hours as needed for headache or moderate pain.  . Marland Kitchenmipramine (TOFRANIL) 25 MG tablet Take 50 mg by mouth at bedtime.  . Lidocaine (BLUE-EMU PAIN RELIEF DRY EX) Apply 1 application topically daily.  . Marland Kitchenosartan-hydrochlorothiazide (HYZAAR) 100-12.5 MG tablet Take one tablet by mouth every day for blood pressure (Patient taking differently: Take 1 tablet by mouth daily.)  . ondansetron (ZOFRAN) 4 MG tablet Take 1 tablet (4 mg total) by mouth every 6 (six) hours.  . potassium chloride (KLOR-CON M10) 10 MEQ tablet Take 1 tablet (10 mEq total) by mouth daily.  . Probiotic Product (PROBIOTIC DAILY PO) Take 4 capsules by mouth daily.  . sertraline (ZOLOFT) 100 MG tablet Take 1 tablet (100 mg total) by mouth daily. (Patient taking differently: Take 100 mg by mouth at bedtime.)  . simvastatin (ZOCOR) 40 MG tablet Take one tablet daily at bedtime (Patient taking differently: Take 40 mg by mouth at bedtime.)  . potassium chloride SA (KLOR-CON) 20 MEQ tablet Take 3 tablets (60 mEq total) by mouth daily for 3 days.   No facility-administered encounter medications on file as of 08/31/2020.     Review of Systems  Constitutional: Negative for chills and fever.  HENT: Negative for congestion, rhinorrhea and sore throat.   Respiratory: Negative for cough, shortness of breath and wheezing.  Cardiovascular: Negative for chest pain and leg swelling.  Gastrointestinal: Positive for diarrhea and vomiting (resolved). Negative for abdominal pain and nausea.  Genitourinary: Negative for dysuria and frequency.  Musculoskeletal: Negative for arthralgias and back pain.  Skin: Negative for rash.  Neurological: Negative for dizziness, weakness and headaches.     Vitals BP 131/80   Pulse 92   Temp (!) 96.6 F (35.9 C)   Ht 5' 4"  (1.626 m)    Wt 169 lb (76.7 kg)   SpO2 98%   BMI 29.01 kg/m   Objective:   Physical Exam Vitals and nursing note reviewed.  Constitutional:      Appearance: Normal appearance.  HENT:     Head: Normocephalic and atraumatic.  Eyes:     Extraocular Movements: Extraocular movements intact.     Conjunctiva/sclera: Conjunctivae normal.     Pupils: Pupils are equal, round, and reactive to light.  Cardiovascular:     Rate and Rhythm: Normal rate and regular rhythm.     Pulses: Normal pulses.     Heart sounds: Normal heart sounds.  Pulmonary:     Effort: Pulmonary effort is normal. No respiratory distress.     Breath sounds: Normal breath sounds. No wheezing, rhonchi or rales.  Abdominal:     General: Abdomen is flat. Bowel sounds are normal. There is no distension.     Palpations: Abdomen is soft. There is no mass.     Tenderness: There is no abdominal tenderness. There is no guarding or rebound.     Hernia: No hernia is present.  Musculoskeletal:        General: Normal range of motion.     Right lower leg: No edema.     Left lower leg: No edema.  Skin:    General: Skin is warm and dry.     Findings: No lesion or rash.  Neurological:     General: No focal deficit present.     Mental Status: She is alert and oriented to person, place, and time.  Psychiatric:        Mood and Affect: Mood normal.        Behavior: Behavior normal.      Assessment and Plan   1. Diarrhea, unspecified type - Lipase - CMP14+EGFR - CBC With Differential  2. Hypokalemia - CMP14+EGFR   Diarrhea- intermittent. Advising eating low-fodmap diet.  Does not appear to have infectious process. However, had some times of vomiting over last few months. Has appt in May 2022 with GI. F/u with GI. Call or rto if worsening pain, fever, or more profuse vomiting and diarrhea. Gave handout on low-fodmap eating plan.  May have IBS with diarrhea. Pt to take imodium prn.  Hypokalemia- cont klor-con. Labs  ordered.  Return if symptoms worsen or fail to improve.

## 2020-09-01 LAB — CMP14+EGFR
ALT: 21 IU/L (ref 0–32)
AST: 17 IU/L (ref 0–40)
Albumin/Globulin Ratio: 1.6 (ref 1.2–2.2)
Albumin: 4.4 g/dL (ref 3.8–4.8)
Alkaline Phosphatase: 95 IU/L (ref 44–121)
BUN/Creatinine Ratio: 11 — ABNORMAL LOW (ref 12–28)
BUN: 10 mg/dL (ref 8–27)
Bilirubin Total: 0.3 mg/dL (ref 0.0–1.2)
CO2: 23 mmol/L (ref 20–29)
Calcium: 9.5 mg/dL (ref 8.7–10.3)
Chloride: 102 mmol/L (ref 96–106)
Creatinine, Ser: 0.91 mg/dL (ref 0.57–1.00)
Globulin, Total: 2.8 g/dL (ref 1.5–4.5)
Glucose: 87 mg/dL (ref 65–99)
Potassium: 3.1 mmol/L — ABNORMAL LOW (ref 3.5–5.2)
Sodium: 143 mmol/L (ref 134–144)
Total Protein: 7.2 g/dL (ref 6.0–8.5)
eGFR: 71 mL/min/{1.73_m2} (ref >59)

## 2020-09-01 LAB — CBC WITH DIFFERENTIAL
Basophils Absolute: 0.1 10*3/uL (ref 0.0–0.2)
Basos: 1 %
EOS (ABSOLUTE): 0.1 10*3/uL (ref 0.0–0.4)
Eos: 1 %
Hematocrit: 43.3 % (ref 34.0–46.6)
Hemoglobin: 14.7 g/dL (ref 11.1–15.9)
Immature Grans (Abs): 0 10*3/uL (ref 0.0–0.1)
Immature Granulocytes: 0 %
Lymphocytes Absolute: 2.5 10*3/uL (ref 0.7–3.1)
Lymphs: 38 %
MCH: 29.4 pg (ref 26.6–33.0)
MCHC: 33.9 g/dL (ref 31.5–35.7)
MCV: 87 fL (ref 79–97)
Monocytes Absolute: 0.7 10*3/uL (ref 0.1–0.9)
Monocytes: 11 %
Neutrophils Absolute: 3.2 10*3/uL (ref 1.4–7.0)
Neutrophils: 49 %
RBC: 5 x10E6/uL (ref 3.77–5.28)
RDW: 12.7 % (ref 11.7–15.4)
WBC: 6.6 10*3/uL (ref 3.4–10.8)

## 2020-09-01 LAB — LIPASE: Lipase: 57 U/L (ref 14–72)

## 2020-09-07 ENCOUNTER — Ambulatory Visit: Payer: Commercial Managed Care - PPO | Admitting: Dermatology

## 2020-09-22 ENCOUNTER — Encounter: Payer: Self-pay | Admitting: Family Medicine

## 2020-09-22 ENCOUNTER — Ambulatory Visit (INDEPENDENT_AMBULATORY_CARE_PROVIDER_SITE_OTHER): Payer: Commercial Managed Care - PPO | Admitting: Family Medicine

## 2020-09-22 VITALS — BP 172/83 | HR 96 | Temp 97.0°F | Ht 64.0 in | Wt 167.4 lb

## 2020-09-22 DIAGNOSIS — F418 Other specified anxiety disorders: Secondary | ICD-10-CM | POA: Diagnosis not present

## 2020-09-22 DIAGNOSIS — R112 Nausea with vomiting, unspecified: Secondary | ICD-10-CM | POA: Diagnosis not present

## 2020-09-22 DIAGNOSIS — R197 Diarrhea, unspecified: Secondary | ICD-10-CM

## 2020-09-22 DIAGNOSIS — E876 Hypokalemia: Secondary | ICD-10-CM

## 2020-09-22 DIAGNOSIS — F4321 Adjustment disorder with depressed mood: Secondary | ICD-10-CM | POA: Diagnosis not present

## 2020-09-22 MED ORDER — SERTRALINE HCL 100 MG PO TABS: 150.0000 mg | ORAL_TABLET | Freq: Every day | ORAL | 0 refills | Status: DC

## 2020-09-22 NOTE — Progress Notes (Signed)
Patient ID: Krystal Silva, female    DOB: 08-Aug-1955, 65 y.o.   MRN: [id number]   Chief Complaint  Patient presents with  . Abdominal Pain   Subjective:    HPI   Pt had a cold last week and then Sunday-Monday had diarrhea and vomiting. Pt states she is better but wants to touch base with provider. Pt states she has some pain with diarrhea.   All night vomiting/diarrhea Sunday. Till yesterday.  Weak and not feeling well.  Out of work last 2 days.  08/22/20- urgent.  similar episode and all negative.  C.diff neg. Stool samples negative.  Has appt 10/12/20 with GI. Had some medication for nausea. immodium and slowed down.  Jan '22. Started. Has h/o colitis in past.   No blood in diarrhea. - 8-9 x per day  8-9 vomiting episodes per day.  Feeling better today, sluggish, dec intake. Drinking water but feeling nausea. Took acid reducer.  Vomiting is bilious, green color.  Medical History Krystal Silva has a past medical history of Allergy, Basal cell carcinoma (09/04/2019), Diverticulitis, Hypercholesteremia, Hypertension, Insomnia, MS (multiple sclerosis) ([place]), and Vitamin D deficiency.   Outpatient Encounter Medications as of 09/22/2020  Medication Sig  . AVONEX PEN 30 MCG/0.5ML AJKT INJECT 30MCG  INTRAMUSCULARLY WEEKLY (Patient taking differently: Inject 30 mcg as directed every Saturday.)  . baclofen (LIORESAL) 10 MG tablet TAKE 1 TABLET 3 TIMES DAILY AS NEEDED. (Patient taking differently: Take 10 mg by mouth at bedtime.)  . cetirizine (ZYRTEC) 10 MG tablet Take 10 mg by mouth daily as needed for allergies.  . Cholecalciferol (VITAMIN D-3) 125 MCG (5000 UT) TABS Take 1 tablet by mouth daily. (Patient taking differently: Take 5,000 Units by mouth daily.)  . diphenhydrAMINE (BENADRYL) 25 MG tablet Take 25 mg by mouth at bedtime as needed for allergies.  . Gluc-Chonn-MSM-Boswellia-Vit D (GLUCOSAMINE CHONDROITIN + D3 PO) Take 1 tablet by mouth daily.  [name] ibuprofen (ADVIL) 200 MG  tablet Take 400 mg by mouth every 6 (six) hours as needed for headache or moderate pain.  [name] imipramine (TOFRANIL) 25 MG tablet Take 50 mg by mouth at bedtime.  . Lidocaine (BLUE-EMU PAIN RELIEF DRY EX) Apply 1 application topically daily.  [name] losartan-hydrochlorothiazide (HYZAAR) 100-12.5 MG tablet Take one tablet by mouth every day for blood pressure (Patient taking differently: Take 1 tablet by mouth daily.)  . ondansetron (ZOFRAN) 4 MG tablet Take 1 tablet (4 mg total) by mouth every 6 (six) hours.  . potassium chloride (KLOR-CON M10) 10 MEQ tablet Take 1 tablet (10 mEq total) by mouth daily.  . Probiotic Product (PROBIOTIC DAILY PO) Take 4 capsules by mouth daily.  . simvastatin (ZOCOR) 40 MG tablet Take one tablet daily at bedtime (Patient taking differently: Take 40 mg by mouth at bedtime.)  . [DISCONTINUED] sertraline (ZOLOFT) 100 MG tablet Take 1 tablet (100 mg total) by mouth daily. (Patient taking differently: Take 100 mg by mouth at bedtime.)  . potassium chloride SA (KLOR-CON) 20 MEQ tablet Take 3 tablets (60 mEq total) by mouth daily for 3 days.  [name] sertraline (ZOLOFT) 100 MG tablet Take 1.5 tablets (150 mg total) by mouth daily.   No facility-administered encounter medications on file as of 09/22/2020.     Review of Systems  Constitutional: Negative for chills and fever.  HENT: Negative for congestion, rhinorrhea and sore throat.   Respiratory: Negative for cough, shortness of breath and wheezing.   Cardiovascular: Negative for chest pain and leg swelling.  Gastrointestinal: Positive for diarrhea and vomiting. Negative for abdominal pain and nausea.  Genitourinary: Negative for dysuria and frequency.  Musculoskeletal: Negative for arthralgias and back pain.  Skin: Negative for rash.  Neurological: Negative for dizziness, weakness and headaches.     Vitals BP (!) 172/83   Pulse 96   Temp (!) 97 F (36.1 C)   Ht 5' 4"  (1.626 m)   Wt 167 lb 6.4 oz (75.9 kg)   SpO2 98%    BMI 28.73 kg/m   Objective:   Physical Exam Vitals and nursing note reviewed.  Constitutional:      General: She is not in acute distress.    Appearance: Normal appearance. She is well-developed. She is not ill-appearing.  HENT:     Head: Normocephalic and atraumatic.     Nose: Nose normal.     Mouth/Throat:     Mouth: Mucous membranes are moist.     Pharynx: Oropharynx is clear.  Eyes:     Extraocular Movements: Extraocular movements intact.     Conjunctiva/sclera: Conjunctivae normal.     Pupils: Pupils are equal, round, and reactive to light.  Cardiovascular:     Rate and Rhythm: Normal rate and regular rhythm.     Pulses: Normal pulses.     Heart sounds: Normal heart sounds. No murmur heard.   Pulmonary:     Effort: Pulmonary effort is normal. No respiratory distress.     Breath sounds: Normal breath sounds. No wheezing, rhonchi or rales.  Abdominal:     General: Abdomen is flat. Bowel sounds are normal. There is no distension.     Palpations: Abdomen is soft.     Tenderness: There is no abdominal tenderness.     Hernia: No hernia is present.  Musculoskeletal:        General: Normal range of motion.     Right lower leg: No edema.     Left lower leg: No edema.  Skin:    General: Skin is warm and dry.     Findings: No lesion or rash.  Neurological:     General: No focal deficit present.     Mental Status: She is alert and oriented to person, place, and time.  Psychiatric:        Mood and Affect: Mood normal.        Behavior: Behavior normal.      Assessment and Plan   1. Non-intractable vomiting with nausea, unspecified vomiting type - CBC With Differential - CMP14+EGFR - Lipase - CT ABDOMEN PELVIS W CONTRAST  2. Diarrhea, unspecified type - CBC With Differential - CMP14+EGFR - Lipase - CT ABDOMEN PELVIS W CONTRAST  3. Grief - Ambulatory referral to Psychology  4. Depression with anxiety - sertraline (ZOLOFT) 100 MG tablet; Take 1.5 tablets (150  mg total) by mouth daily.  Dispense: 135 tablet; Refill: 0  5. Hypokalemia   Counselor for recent father passing.  Vomiting and diarrhea- resolved.  But coming back monthly. Ordered CT abd pelvis- for further evaluation of abdominal concerns with vomiting and diarrhea.  Labs ordered.   Cont with immodium prn for diarrhea.  Pt with h/o IBS with diarrhea normally.  Had testing done last month and neg for C.diff and other stool studies. Has upcoming appt with GI end of May.  Hypokalemia- cont with kcl.  Will recheck labs.  Return if symptoms worsen or fail to improve.

## 2020-09-23 ENCOUNTER — Ambulatory Visit (HOSPITAL_COMMUNITY)
Admission: RE | Admit: 2020-09-23 | Discharge: 2020-09-23 | Disposition: A | Discharge status: Home / Self Care | Payer: Commercial Managed Care - PPO | Source: Ambulatory Visit | Attending: Family Medicine | Admitting: Family Medicine

## 2020-09-23 DIAGNOSIS — R197 Diarrhea, unspecified: Secondary | ICD-10-CM | POA: Insufficient documentation

## 2020-09-23 DIAGNOSIS — R112 Nausea with vomiting, unspecified: Secondary | ICD-10-CM | POA: Insufficient documentation

## 2020-09-23 LAB — CMP14+EGFR
ALT: 13 IU/L (ref 0–32)
AST: 13 IU/L (ref 0–40)
Albumin/Globulin Ratio: 1.7 (ref 1.2–2.2)
Albumin: 4.7 g/dL (ref 3.8–4.8)
Alkaline Phosphatase: 92 IU/L (ref 44–121)
BUN/Creatinine Ratio: 20 (ref 12–28)
BUN: 16 mg/dL (ref 8–27)
Bilirubin Total: 0.8 mg/dL (ref 0.0–1.2)
CO2: 27 mmol/L (ref 20–29)
Calcium: 9 mg/dL (ref 8.7–10.3)
Chloride: 101 mmol/L (ref 96–106)
Creatinine, Ser: 0.79 mg/dL (ref 0.57–1.00)
Globulin, Total: 2.7 g/dL (ref 1.5–4.5)
Glucose: 121 mg/dL — ABNORMAL HIGH (ref 65–99)
Potassium: 3.1 mmol/L — ABNORMAL LOW (ref 3.5–5.2)
Sodium: 143 mmol/L (ref 134–144)
Total Protein: 7.4 g/dL (ref 6.0–8.5)
eGFR: 85 mL/min/{1.73_m2} (ref >59)

## 2020-09-23 LAB — CBC WITH DIFFERENTIAL
Basophils Absolute: 0 10*3/uL (ref 0.0–0.2)
Basos: 0 %
EOS (ABSOLUTE): 0.1 10*3/uL (ref 0.0–0.4)
Eos: 1 %
Hematocrit: 44.6 % (ref 34.0–46.6)
Hemoglobin: 15.5 g/dL (ref 11.1–15.9)
Immature Grans (Abs): 0 10*3/uL (ref 0.0–0.1)
Immature Granulocytes: 0 %
Lymphocytes Absolute: 2.9 10*3/uL (ref 0.7–3.1)
Lymphs: 38 %
MCH: 30.3 pg (ref 26.6–33.0)
MCHC: 34.8 g/dL (ref 31.5–35.7)
MCV: 87 fL (ref 79–97)
Monocytes Absolute: 0.7 10*3/uL (ref 0.1–0.9)
Monocytes: 9 %
Neutrophils Absolute: 3.9 10*3/uL (ref 1.4–7.0)
Neutrophils: 52 %
RBC: 5.12 x10E6/uL (ref 3.77–5.28)
RDW: 12.7 % (ref 11.7–15.4)
WBC: 7.5 10*3/uL (ref 3.4–10.8)

## 2020-09-23 LAB — LIPASE: Lipase: 25 U/L (ref 14–72)

## 2020-09-23 MED ORDER — IOHEXOL 300 MG/ML  SOLN
100.0000 mL | Freq: Once | INTRAMUSCULAR | Status: AC | PRN
  Administered 2020-09-23: 100 mL via INTRAVENOUS

## 2020-09-23 MED ORDER — IOHEXOL 9 MG/ML PO SOLN
ORAL | Status: AC
  Filled 2020-09-23: qty 1000

## 2020-09-30 ENCOUNTER — Other Ambulatory Visit (HOSPITAL_COMMUNITY): Payer: Self-pay | Admitting: Family Medicine

## 2020-09-30 DIAGNOSIS — Z1231 Encounter for screening mammogram for malignant neoplasm of breast: Secondary | ICD-10-CM

## 2020-10-04 ENCOUNTER — Encounter: Payer: Self-pay | Admitting: Family Medicine

## 2020-10-10 NOTE — H&P (View-Only) (Signed)
Referring Provider: Erven Colla, DO Primary Care Physician:  Erven Colla, DO Primary GI Physician: Dr. Abbey Chatters  Chief Complaint  Patient presents with  . Vomiting    2-3 times per month, last few days. Zofran does not help  . Diarrhea    Still ongoing few times per mouth    HPI:   Krystal Silva is a 65 y.o. female presenting today for post procedure follow-up.  She underwent colonoscopy 08/03/2020 revealing nonbleeding internal hemorrhoids, diverticulosis in the sigmoid, descending, and transverse colon, otherwise normal exam.  Biopsies were taken to evaluate for microscopic colitis.  Recommended repeat colonoscopy in 10 years.  Follow-up in 3 months.  Pathology revealed focal active colitis with main consideration being acute self-limited (infectious) colitis versus drug effect (NSAIDs).  Dr. Abbey Chatters recommended obtaining GI pathogen panel and C. difficile testing and avoid NSAIDs.  C. difficile testing was negative.  GI pathogen panel also negative.  Patient saw primary care 08/31/2020 for nausea, vomiting, diarrhea.  She reported symptoms since January.  Initially thought it was food poisoning.  Diarrhea occurs when she is eating.  She is taking a lot of NSAIDs due to an MS.  She was advised to follow a low FODMAP diet, Imodium as needed, and labs updated. CMP revealed potassium 3.1, otherwise no significant abnormalities.  CBC within normal limits.  Lipase normal.   Follow-up with PCP 09/22/2020.  She reported having a cold last week and then had diarrhea and vomiting Sunday and Monday.  Labs again were updated and CT A/P with contrast was ordered.  Labs were unrevealing aside from potassium 3.1 and glucose 121.  CT with liquid stool throughout much of the colon, sigmoid diverticula without diverticulitis, no bowel wall thickening or bowel obstruction, stable large right renal cyst which impresses upon the right renal collecting system with no hydronephrosis, mass in the lateral  left breast with mild calcification with recommendations for mammography and sonographic correlation.  Mammography is scheduled for 6/13.   Today:  Since Feb, every 2 weeks, she has N/V for 3 days with acute on chronic diarrhea. Urine gets very dark because she can't eat or drink. Most recent bout started Saturday.  Today, she is starting to feel a little improvement.  Occasional ibuprofen for MS. Not even weekly. Hasn't taken ibuprofen in weeks.   Between episodes of nausea/vomiting she feels ok. She can eat fine. Chronic history of reflux. Taking famotidine BID 3 days a week.  States she would not call this well controlled.  She actually just started this a few weeks ago. Zofran not helpful because she can't keep it down.  No regular fried/fatty/greasy/spicy foods. No identified trigger of nausea/vomiting. No new medications prior to onset. No one with similar symptoms. No sick contacts.   Diarrhea continues to be a problem.  Diarrhea has been present for years. 8-9 BMs per day with nausea/vomiting. Between nausea/vomiting, she typically has 1 watery BM daily.  No associated abdominal pain. No blood in the stools or black stools. Taking imodium as needed, about once a week. Diarrhea is postprandial and urgent.   Denies fever chills.  Admits to intermittent lightheadedness/weakness related to acute flare of nausea/vomiting.  This is common during these spells.  Documented weight loss of 16 pounds over the last 4 months.  Denies chest pain, heart palpitations, shortness of breath, cough.  Past Medical History:  Diagnosis Date  . Allergy   . Basal cell carcinoma 09/04/2019   right parotid area -  Refer to [facility]  . Diverticulitis   . Hypercholesteremia   . Hypertension   . Insomnia   . MS (multiple sclerosis) ([place])   . Vitamin D deficiency     Past Surgical History:  Procedure Laterality Date  . BIOPSY  08/03/2020   Procedure: BIOPSY;  Surgeon: Eloise Harman, DO;  Location: [facility];  Service: Endoscopy;;  . COLONOSCOPY  08/21/2009  . COLONOSCOPY WITH PROPOFOL N/A 08/03/2020    Surgeon: Eloise Harman, DO;  internal hemorrhoids, diverticulosis in the sigmoid, descending, and transverse colon, otherwise normal exam.  Biopsies were taken to evaluate for microscopic colitis.  Recommended repeat colonoscopy in 10 years.   [name] KNEE ARTHROSCOPY WITH MEDIAL MENISECTOMY Right 03/27/2015   Procedure: KNEE ARTHROSCOPY WITH PARTIAL MEDIAL MENISECTOMY AND MICROFRACTURE;  Surgeon: Carole Civil, MD;  Location: [facility];  Service: Orthopedics;  Laterality: Right;  . TONSILLECTOMY    . WISDOM TOOTH EXTRACTION  1980's    Current Outpatient Medications  Medication Sig Dispense Refill  . AVONEX PEN 30 MCG/0.5ML AJKT INJECT 30MCG  INTRAMUSCULARLY WEEKLY (Patient taking differently: Inject 30 mcg as directed every Saturday.) 12 each 3  . baclofen (LIORESAL) 10 MG tablet TAKE 1 TABLET 3 TIMES DAILY AS NEEDED. (Patient taking differently: Take 10 mg by mouth at bedtime.) 270 tablet 3  . cetirizine (ZYRTEC) 10 MG tablet Take 10 mg by mouth daily as needed for allergies.    . Cholecalciferol (VITAMIN D-3) 125 MCG (5000 UT) TABS Take 1 tablet by mouth daily. (Patient taking differently: Take 5,000 Units by mouth daily.) 90 tablet 1  . diphenhydrAMINE (BENADRYL) 25 MG tablet Take 25 mg by mouth at bedtime as needed for allergies.    . Gluc-Chonn-MSM-Boswellia-Vit D (GLUCOSAMINE CHONDROITIN + D3 PO) Take 1 tablet by mouth daily.    [name] ibuprofen (ADVIL) 200 MG tablet Take 400 mg by mouth every 6 (six) hours as needed for headache or moderate pain.    [name] imipramine (TOFRANIL) 25 MG tablet Take 50 mg by mouth at bedtime.    . Lidocaine (BLUE-EMU PAIN RELIEF DRY EX) Apply 1 application topically daily.    . Loperamide HCl (ANTI-DIARRHEAL PO) Take by mouth. As needed    . losartan-hydrochlorothiazide (HYZAAR) 100-12.5 MG tablet Take one tablet by mouth every day for blood pressure (Patient taking  differently: Take 1 tablet by mouth daily.) 90 tablet 1  . ondansetron (ZOFRAN ODT) 4 MG disintegrating tablet Place 1 tablet (4 mg total) under your tongue every 6-8 hours as needed for nausea/vomiting. 30 tablet 1  . pantoprazole (PROTONIX) 40 MG tablet Take 1 tablet (40 mg total) by mouth daily before breakfast. 30 tablet 3  . potassium chloride (KLOR-CON M10) 10 MEQ tablet Take 1 tablet (10 mEq total) by mouth daily. 90 tablet 1  . Probiotic Product (PROBIOTIC DAILY PO) Take 4 capsules by mouth daily.    . sertraline (ZOLOFT) 100 MG tablet Take 1.5 tablets (150 mg total) by mouth daily. 135 tablet 0  . simvastatin (ZOCOR) 40 MG tablet Take one tablet daily at bedtime (Patient taking differently: Take 40 mg by mouth at bedtime.) 90 tablet 1  . potassium chloride SA (KLOR-CON) 20 MEQ tablet Take 3 tablets (60 mEq total) by mouth daily for 3 days. (Patient not taking: Reported on 10/12/2020) 9 tablet 0   No current facility-administered medications for this visit.    Allergies as of 10/12/2020 - Review Complete 10/12/2020  Allergen Reaction Noted  . Cefzil [cefprozil]  Diarrhea 03/31/2019  . Codeine Nausea And Vomiting   . Penicillins Rash 08/11/2009  . Sulfonamide derivatives Rash     Family History  Problem Relation Age of Onset  . Hypertension Mother   . Hyperlipidemia Father   . Hearing loss Father   . Hyperlipidemia Brother   . Diabetes Brother   . Vision loss Maternal Grandmother   . Vision loss Paternal Grandmother   . Cancer Other        breast  . Inflammatory bowel disease Neg Hx   . Colon cancer Neg Hx     Social History   Socioeconomic History  . Marital status: Divorced    Spouse name: Not on file  . Number of children: Not on file  . Years of education: 67  . Highest education level: Not on file  Occupational History  . Occupation: work   Tobacco Use  . Smoking status: Former Smoker    Packs/day: 1.50    Years: 30.00    Pack years: 45.00    Types:  Cigarettes    Start date: 12/26/1974    Quit date: 06/22/2004    Years since quitting: 16.3  . Smokeless tobacco: Never Used  Vaping Use  . Vaping Use: Never used  Substance and Sexual Activity  . Alcohol use: Yes    Alcohol/week: 1.0 standard drink    Types: 1 Cans of beer per week    Comment: rare social  . Drug use: No  . Sexual activity: Not Currently  Other Topics Concern  . 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 No distress noted. Pleasant and cooperative.  Head:  Normocephalic and atraumatic. Eyes:  Conjuctiva clear without scleral icterus. Heart:  S1, S2 present without murmurs appreciated. Lungs:  Clear to auscultation bilaterally. No wheezes, rales, or rhonchi. No distress.  Abdomen:  +BS, soft, non-tender and non-distended. No rebound or guarding. No HSM or masses noted. Msk:  Symmetrical without gross deformities. Normal posture. Extremities:  Without edema. Neurologic:  Alert and  oriented x4 Psych:  Normal mood and affect.   Assessment: 65 year old female with history of HTN, HLD, multiple sclerosis presenting today for further evaluation of chronic diarrhea and recurrent nausea/vomiting.   Chronic diarrhea: Patient reports 2-year history of diarrhea taking Imodium about once a week.  Typically with 1 watery BM daily, typically postprandially, and with  urgency.  During flares of nausea/vomiting discussed below which occurs every couple of weeks, she will have up to 6-8 bowel movements per day.  No BRBPR or melena.  Colonoscopy March 2022 with random colon biopsies revealing focal active colitis with main consideration being infectious versus NSAID effect.  C. difficile and GI pathogen panel were negative  at that time.  She does take NSAIDs as needed, but less than once weekly, and I doubt this is the etiology of her ongoing diarrhea.  No associated abdominal pain, so symptoms are less consistent with IBS, though cannot rule this out.  Less likely a missed infectious diarrhea, but will recheck stool studies to err on the side of caution.  Additional considerations include celiac disease, thyroid abnormalities, and we will screen for these today. Could consider low-dose of Levsin or dicyclomine if evaluation is unrevealing.  Nausea/vomiting: New onset intermittent nausea/vomiting every couple of weeks lasting 2 to 3 days at a time since February 2022.  No identified triggers. Denies new medications prior, sick contacts. She does report history of reflux that is not adequately controlled, currently on Pepcid as needed.  No hematemesis.  Documented 16 pound weight loss over the last 4 months.  CT A/P with contrast 09/23/2020 with no acute findings in the abdomen or pelvis.  She did have liquid stool throughout much of the colon. DDX: Uncontrolled GERD, gastroparesis, H. pylori, gastritis, duodenitis, celiac disease.  Plan to update labs today, screen for celiac disease, start Protonix 40 mg daily, and proceed with EGD for further evaluation. Consider GES if EGD is unrevealing and symptoms persist.    Plan: 1.  CBC, CMP, TTG IgA, IgA total, TSH, C. difficile toxin A/B with reflex, GI profile panel.  2.  EGD with propofol with Dr. Abbey Chatters in the near future. The risks, benefits, and alternatives have been discussed with the patient in detail. The patient states  understanding and desires to proceed.  ASA II  3.  Start Protonix 40 mg daily 30 minutes before breakfast.  4.  Zofran 4 mg ODT every 6-8 hours for nausea/vomiting.  5.  Follow a bland diet for now and focus on hydration.  6.  Moving forward, follow a low-fat/low fiber diet.  7.  Follow-up after EGD.    Aliene Altes, PA-C [facility] 10/12/2020

## 2020-10-10 NOTE — Progress Notes (Signed)
Referring Provider: Erven Colla, DO Primary Care Physician:  Erven Colla, DO Primary GI Physician: Dr. Abbey Chatters  Chief Complaint  Patient presents with  . Vomiting    2-3 times per month, last few days. Zofran does not help  . Diarrhea    Still ongoing few times per mouth    HPI:   Krystal Silva is a 65 y.o. female presenting today for post procedure follow-up.  She underwent colonoscopy 08/03/2020 revealing nonbleeding internal hemorrhoids, diverticulosis in the sigmoid, descending, and transverse colon, otherwise normal exam.  Biopsies were taken to evaluate for microscopic colitis.  Recommended repeat colonoscopy in 10 years.  Follow-up in 3 months.  Pathology revealed focal active colitis with main consideration being acute self-limited (infectious) colitis versus drug effect (NSAIDs).  Dr. Abbey Chatters recommended obtaining GI pathogen panel and C. difficile testing and avoid NSAIDs.  C. difficile testing was negative.  GI pathogen panel also negative.  Patient saw primary care 08/31/2020 for nausea, vomiting, diarrhea.  She reported symptoms since January.  Initially thought it was food poisoning.  Diarrhea occurs when she is eating.  She is taking a lot of NSAIDs due to an MS.  She was advised to follow a low FODMAP diet, Imodium as needed, and labs updated. CMP revealed potassium 3.1, otherwise no significant abnormalities.  CBC within normal limits.  Lipase normal.   Follow-up with PCP 09/22/2020.  She reported having a cold last week and then had diarrhea and vomiting Sunday and Monday.  Labs again were updated and CT A/P with contrast was ordered.  Labs were unrevealing aside from potassium 3.1 and glucose 121.  CT with liquid stool throughout much of the colon, sigmoid diverticula without diverticulitis, no bowel wall thickening or bowel obstruction, stable large right renal cyst which impresses upon the right renal collecting system with no hydronephrosis, mass in the lateral  left breast with mild calcification with recommendations for mammography and sonographic correlation.  Mammography is scheduled for 6/13.   Today:  Since Feb, every 2 weeks, she has N/V for 3 days with acute on chronic diarrhea. Urine gets very dark because she can't eat or drink. Most recent bout started Saturday.  Today, she is starting to feel a little improvement.  Occasional ibuprofen for MS. Not even weekly. Hasn't taken ibuprofen in weeks.   Between episodes of nausea/vomiting she feels ok. She can eat fine. Chronic history of reflux. Taking famotidine BID 3 days a week.  States she would not call this well controlled.  She actually just started this a few weeks ago. Zofran not helpful because she can't keep it down.  No regular fried/fatty/greasy/spicy foods. No identified trigger of nausea/vomiting. No new medications prior to onset. No one with similar symptoms. No sick contacts.   Diarrhea continues to be a problem.  Diarrhea has been present for years. 8-9 BMs per day with nausea/vomiting. Between nausea/vomiting, she typically has 1 watery BM daily.  No associated abdominal pain. No blood in the stools or black stools. Taking imodium as needed, about once a week. Diarrhea is postprandial and urgent.   Denies fever chills.  Admits to intermittent lightheadedness/weakness related to acute flare of nausea/vomiting.  This is common during these spells.  Documented weight loss of 16 pounds over the last 4 months.  Denies chest pain, heart palpitations, shortness of breath, cough.  Past Medical History:  Diagnosis Date  . Allergy   . Basal cell carcinoma 09/04/2019   right parotid area -  Refer to [facility]  . Diverticulitis   . Hypercholesteremia   . Hypertension   . Insomnia   . MS (multiple sclerosis) ([place])   . Vitamin D deficiency     Past Surgical History:  Procedure Laterality Date  . BIOPSY  08/03/2020   Procedure: BIOPSY;  Surgeon: Eloise Harman, DO;  Location: [facility];  Service: Endoscopy;;  . COLONOSCOPY  08/21/2009  . COLONOSCOPY WITH PROPOFOL N/A 08/03/2020    Surgeon: Eloise Harman, DO;  internal hemorrhoids, diverticulosis in the sigmoid, descending, and transverse colon, otherwise normal exam.  Biopsies were taken to evaluate for microscopic colitis.  Recommended repeat colonoscopy in 10 years.   [name] KNEE ARTHROSCOPY WITH MEDIAL MENISECTOMY Right 03/27/2015   Procedure: KNEE ARTHROSCOPY WITH PARTIAL MEDIAL MENISECTOMY AND MICROFRACTURE;  Surgeon: Carole Civil, MD;  Location: [facility] ORS;  Service: Orthopedics;  Laterality: Right;  . TONSILLECTOMY    . WISDOM TOOTH EXTRACTION  1980's    Current Outpatient Medications  Medication Sig Dispense Refill  . AVONEX PEN 30 MCG/0.5ML AJKT INJECT 30MCG  INTRAMUSCULARLY WEEKLY (Patient taking differently: Inject 30 mcg as directed every Saturday.) 12 each 3  . baclofen (LIORESAL) 10 MG tablet TAKE 1 TABLET 3 TIMES DAILY AS NEEDED. (Patient taking differently: Take 10 mg by mouth at bedtime.) 270 tablet 3  . cetirizine (ZYRTEC) 10 MG tablet Take 10 mg by mouth daily as needed for allergies.    . Cholecalciferol (VITAMIN D-3) 125 MCG (5000 UT) TABS Take 1 tablet by mouth daily. (Patient taking differently: Take 5,000 Units by mouth daily.) 90 tablet 1  . diphenhydrAMINE (BENADRYL) 25 MG tablet Take 25 mg by mouth at bedtime as needed for allergies.    . Gluc-Chonn-MSM-Boswellia-Vit D (GLUCOSAMINE CHONDROITIN + D3 PO) Take 1 tablet by mouth daily.    [name] ibuprofen (ADVIL) 200 MG tablet Take 400 mg by mouth every 6 (six) hours as needed for headache or moderate pain.    [name] imipramine (TOFRANIL) 25 MG tablet Take 50 mg by mouth at bedtime.    . Lidocaine (BLUE-EMU PAIN RELIEF DRY EX) Apply 1 application topically daily.    . Loperamide HCl (ANTI-DIARRHEAL PO) Take by mouth. As needed    . losartan-hydrochlorothiazide (HYZAAR) 100-12.5 MG tablet Take one tablet by mouth every day for blood pressure (Patient taking  differently: Take 1 tablet by mouth daily.) 90 tablet 1  . ondansetron (ZOFRAN ODT) 4 MG disintegrating tablet Place 1 tablet (4 mg total) under your tongue every 6-8 hours as needed for nausea/vomiting. 30 tablet 1  . pantoprazole (PROTONIX) 40 MG tablet Take 1 tablet (40 mg total) by mouth daily before breakfast. 30 tablet 3  . potassium chloride (KLOR-CON M10) 10 MEQ tablet Take 1 tablet (10 mEq total) by mouth daily. 90 tablet 1  . Probiotic Product (PROBIOTIC DAILY PO) Take 4 capsules by mouth daily.    . sertraline (ZOLOFT) 100 MG tablet Take 1.5 tablets (150 mg total) by mouth daily. 135 tablet 0  . simvastatin (ZOCOR) 40 MG tablet Take one tablet daily at bedtime (Patient taking differently: Take 40 mg by mouth at bedtime.) 90 tablet 1  . potassium chloride SA (KLOR-CON) 20 MEQ tablet Take 3 tablets (60 mEq total) by mouth daily for 3 days. (Patient not taking: Reported on 10/12/2020) 9 tablet 0   No current facility-administered medications for this visit.    Allergies as of 10/12/2020 - Review Complete 10/12/2020  Allergen Reaction Noted  . Cefzil [cefprozil]  Diarrhea 03/31/2019  . Codeine Nausea And Vomiting   . Penicillins Rash 08/11/2009  . Sulfonamide derivatives Rash     Family History  Problem Relation Age of Onset  . Hypertension Mother   . Hyperlipidemia Father   . Hearing loss Father   . Hyperlipidemia Brother   . Diabetes Brother   . Vision loss Maternal Grandmother   . Vision loss Paternal Grandmother   . Cancer Other        breast  . Inflammatory bowel disease Neg Hx   . Colon cancer Neg Hx     Social History   Socioeconomic History  . Marital status: Divorced    Spouse name: Not on file  . Number of children: Not on file  . Years of education: 67  . Highest education level: Not on file  Occupational History  . Occupation: work   Tobacco Use  . Smoking status: Former Smoker    Packs/day: 1.50    Years: 30.00    Pack years: 45.00    Types:  Cigarettes    Start date: 12/26/1974    Quit date: 06/22/2004    Years since quitting: 16.3  . Smokeless tobacco: Never Used  Vaping Use  . Vaping Use: Never used  Substance and Sexual Activity  . Alcohol use: Yes    Alcohol/week: 1.0 standard drink    Types: 1 Cans of beer per week    Comment: rare social  . Drug use: No  . Sexual activity: Not Currently  Other Topics Concern  . 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 No distress noted. Pleasant and cooperative.  Head:  Normocephalic and atraumatic. Eyes:  Conjuctiva clear without scleral icterus. Heart:  S1, S2 present without murmurs appreciated. Lungs:  Clear to auscultation bilaterally. No wheezes, rales, or rhonchi. No distress.  Abdomen:  +BS, soft, non-tender and non-distended. No rebound or guarding. No HSM or masses noted. Msk:  Symmetrical without gross deformities. Normal posture. Extremities:  Without edema. Neurologic:  Alert and  oriented x4 Psych:  Normal mood and affect.   Assessment: 65 year old female with history of HTN, HLD, multiple sclerosis presenting today for further evaluation of chronic diarrhea and recurrent nausea/vomiting.   Chronic diarrhea: Patient reports 2-year history of diarrhea taking Imodium about once a week.  Typically with 1 watery BM daily, typically postprandially, and with  urgency.  During flares of nausea/vomiting discussed below which occurs every couple of weeks, she will have up to 6-8 bowel movements per day.  No BRBPR or melena.  Colonoscopy March 2022 with random colon biopsies revealing focal active colitis with main consideration being infectious versus NSAID effect.  C. difficile and GI pathogen panel were negative  at that time.  She does take NSAIDs as needed, but less than once weekly, and I doubt this is the etiology of her ongoing diarrhea.  No associated abdominal pain, so symptoms are less consistent with IBS, though cannot rule this out.  Less likely a missed infectious diarrhea, but will recheck stool studies to err on the side of caution.  Additional considerations include celiac disease, thyroid abnormalities, and we will screen for these today. Could consider low-dose of Levsin or dicyclomine if evaluation is unrevealing.  Nausea/vomiting: New onset intermittent nausea/vomiting every couple of weeks lasting 2 to 3 days at a time since February 2022.  No identified triggers. Denies new medications prior, sick contacts. She does report history of reflux that is not adequately controlled, currently on Pepcid as needed.  No hematemesis.  Documented 16 pound weight loss over the last 4 months.  CT A/P with contrast 09/23/2020 with no acute findings in the abdomen or pelvis.  She did have liquid stool throughout much of the colon. DDX: Uncontrolled GERD, gastroparesis, H. pylori, gastritis, duodenitis, celiac disease.  Plan to update labs today, screen for celiac disease, start Protonix 40 mg daily, and proceed with EGD for further evaluation. Consider GES if EGD is unrevealing and symptoms persist.    Plan: 1.  CBC, CMP, TTG IgA, IgA total, TSH, C. difficile toxin A/B with reflex, GI profile panel.  2.  EGD with propofol with Dr. Abbey Chatters in the near future. The risks, benefits, and alternatives have been discussed with the patient in detail. The patient states  understanding and desires to proceed.  ASA II  3.  Start Protonix 40 mg daily 30 minutes before breakfast.  4.  Zofran 4 mg ODT every 6-8 hours for nausea/vomiting.  5.  Follow a bland diet for now and focus on hydration.  6.  Moving forward, follow a low-fat/low fiber diet.  7.  Follow-up after EGD.    Aliene Altes, PA-C [facility] 10/12/2020

## 2020-10-12 ENCOUNTER — Encounter: Payer: Self-pay | Admitting: Gastroenterology

## 2020-10-12 ENCOUNTER — Telehealth: Payer: Self-pay

## 2020-10-12 ENCOUNTER — Other Ambulatory Visit: Payer: Self-pay | Admitting: Gastroenterology

## 2020-10-12 ENCOUNTER — Other Ambulatory Visit: Payer: Self-pay

## 2020-10-12 ENCOUNTER — Ambulatory Visit (INDEPENDENT_AMBULATORY_CARE_PROVIDER_SITE_OTHER): Payer: Commercial Managed Care - PPO | Admitting: Gastroenterology

## 2020-10-12 VITALS — BP 145/89 | HR 92 | Temp 97.2°F | Ht 64.0 in | Wt 165.8 lb

## 2020-10-12 DIAGNOSIS — R197 Diarrhea, unspecified: Secondary | ICD-10-CM | POA: Insufficient documentation

## 2020-10-12 DIAGNOSIS — R112 Nausea with vomiting, unspecified: Secondary | ICD-10-CM | POA: Diagnosis not present

## 2020-10-12 DIAGNOSIS — K219 Gastro-esophageal reflux disease without esophagitis: Secondary | ICD-10-CM

## 2020-10-12 DIAGNOSIS — R1110 Vomiting, unspecified: Secondary | ICD-10-CM | POA: Insufficient documentation

## 2020-10-12 MED ORDER — PANTOPRAZOLE SODIUM 40 MG PO TBEC: 40.0000 mg | DELAYED_RELEASE_TABLET | Freq: Every day | ORAL | 3 refills | Status: DC

## 2020-10-12 MED ORDER — ONDANSETRON 4 MG PO TBDP: ORAL_TABLET | ORAL | 1 refills | Status: DC

## 2020-10-12 NOTE — Patient Instructions (Addendum)
We will arrange for you to have an upper endoscopy with Dr. Abbey Chatters in the near future.  Please have labs/stool studies completed at [facility].  Please stop famotidine and start pantoprazole 40 mg daily 30 minutes before breakfast for acid reflux.  I have sent a prescription for Zofran disintegrating tablet to your pharmacy.  You will place 1 tablet under your tongue every 6-8 hours as needed for nausea/vomiting.  Do not take this with the Zofran tablet that you swallow whole.  Follow a low-fat/low fiber diet for now. Stick to a bland diet and focus on hydration.  Follow a GERD diet:  Avoid fried, fatty, greasy, spicy, citrus foods. Avoid caffeine and carbonated beverages. Avoid chocolate. Try eating 4-6 small meals a day rather than 3 large meals. Do not eat within 3 hours of laying down. Prop head of bed up on wood or bricks to create a 6 inch incline.  We will plan to see you back after your procedure.  Do not hesitate to call if you have any questions or concerns prior.   Aliene Altes, PA-C [facility]

## 2020-10-12 NOTE — Telephone Encounter (Signed)
PA for EGD submitted via [facility] website. Case pending. Case ID# [id number].

## 2020-10-12 NOTE — Progress Notes (Signed)
Cc'ed to pcp °

## 2020-10-13 ENCOUNTER — Other Ambulatory Visit: Payer: Self-pay | Admitting: Gastroenterology

## 2020-10-13 ENCOUNTER — Telehealth: Payer: Self-pay | Admitting: Internal Medicine

## 2020-10-13 ENCOUNTER — Other Ambulatory Visit: Payer: Self-pay

## 2020-10-13 DIAGNOSIS — E876 Hypokalemia: Secondary | ICD-10-CM

## 2020-10-13 NOTE — Telephone Encounter (Signed)
EGD approved. PA# [id number], valid 10/26/20-11/24/20.

## 2020-10-13 NOTE — Telephone Encounter (Signed)
noted 

## 2020-10-13 NOTE — Telephone Encounter (Signed)
Spoke with patient. See result note. Dena, [place]. No need to call patient.

## 2020-10-13 NOTE — Telephone Encounter (Signed)
Pt was seen in office yesterday and called this afternoon saying that Aliene Altes, PA had called. I told her that Waleska wasn't in the office, but I would send a phone note to her and the nurse. Please call [phone]

## 2020-10-14 LAB — CMP14+EGFR
ALT: 14 IU/L (ref 0–32)
AST: 16 IU/L (ref 0–40)
Albumin/Globulin Ratio: 2 (ref 1.2–2.2)
Albumin: 4.6 g/dL (ref 3.8–4.8)
Alkaline Phosphatase: 101 IU/L (ref 44–121)
BUN/Creatinine Ratio: 16 (ref 12–28)
BUN: 13 mg/dL (ref 8–27)
Bilirubin Total: 1.1 mg/dL (ref 0.0–1.2)
CO2: 23 mmol/L (ref 20–29)
Calcium: 9.3 mg/dL (ref 8.7–10.3)
Chloride: 101 mmol/L (ref 96–106)
Creatinine, Ser: 0.81 mg/dL (ref 0.57–1.00)
Globulin, Total: 2.3 g/dL (ref 1.5–4.5)
Glucose: 122 mg/dL — ABNORMAL HIGH (ref 65–99)
Potassium: 3 mmol/L — ABNORMAL LOW (ref 3.5–5.2)
Sodium: 143 mmol/L (ref 134–144)
Total Protein: 6.9 g/dL (ref 6.0–8.5)
eGFR: 82 mL/min/{1.73_m2} (ref >59)

## 2020-10-14 LAB — CBC WITH DIFFERENTIAL/PLATELET
Basophils Absolute: 0 10*3/uL (ref 0.0–0.2)
Basos: 0 %
EOS (ABSOLUTE): 0.1 10*3/uL (ref 0.0–0.4)
Eos: 2 %
Hematocrit: 44.1 % (ref 34.0–46.6)
Hemoglobin: 15.3 g/dL (ref 11.1–15.9)
Immature Grans (Abs): 0 10*3/uL (ref 0.0–0.1)
Immature Granulocytes: 0 %
Lymphocytes Absolute: 1.5 10*3/uL (ref 0.7–3.1)
Lymphs: 22 %
MCH: 29.8 pg (ref 26.6–33.0)
MCHC: 34.7 g/dL (ref 31.5–35.7)
MCV: 86 fL (ref 79–97)
Monocytes Absolute: 0.6 10*3/uL (ref 0.1–0.9)
Monocytes: 9 %
Neutrophils Absolute: 4.5 10*3/uL (ref 1.4–7.0)
Neutrophils: 67 %
Platelets: 261 10*3/uL (ref 150–450)
RBC: 5.14 x10E6/uL (ref 3.77–5.28)
RDW: 12.6 % (ref 11.7–15.4)
WBC: 6.8 10*3/uL (ref 3.4–10.8)

## 2020-10-14 LAB — IGA: IgA/Immunoglobulin A, Serum: 283 mg/dL (ref 87–352)

## 2020-10-14 LAB — TSH: TSH: 3.85 u[IU]/mL (ref 0.450–4.500)

## 2020-10-14 LAB — TISSUE TRANSGLUTAMINASE, IGA: Transglutaminase IgA: <2 U/mL (ref 0–3)

## 2020-10-15 LAB — GI PROFILE, STOOL, PCR

## 2020-10-16 LAB — C DIFFICILE, CYTOTOXIN B

## 2020-10-16 LAB — C DIFFICILE TOXINS A+B W/RFLX: C difficile Toxins A+B, EIA: NEGATIVE

## 2020-10-19 ENCOUNTER — Other Ambulatory Visit: Payer: Self-pay | Admitting: Neurology

## 2020-10-20 ENCOUNTER — Telehealth: Payer: Self-pay

## 2020-10-20 NOTE — Telephone Encounter (Signed)
error 

## 2020-10-21 ENCOUNTER — Telehealth: Payer: Self-pay | Admitting: *Deleted

## 2020-10-21 NOTE — Telephone Encounter (Signed)
Received call from endo unable to reach patient. No covid test needed but still needs lab work done  [employer] pt and she states someone already called and informed her of this. Reports she went this morning for lab work done.  Called Melanie in endo and she did not speak with pt so unsure who she spoke with pt. Advised will look out for labs to come in.

## 2020-10-22 ENCOUNTER — Telehealth: Payer: Self-pay | Admitting: Gastroenterology

## 2020-10-22 LAB — BASIC METABOLIC PANEL
BUN/Creatinine Ratio: 14 (ref 12–28)
BUN: 14 mg/dL (ref 8–27)
CO2: 25 mmol/L (ref 20–29)
Calcium: 9.4 mg/dL (ref 8.7–10.3)
Chloride: 100 mmol/L (ref 96–106)
Creatinine, Ser: 1.01 mg/dL — ABNORMAL HIGH (ref 0.57–1.00)
Glucose: 117 mg/dL — ABNORMAL HIGH (ref 65–99)
Potassium: 3.5 mmol/L (ref 3.5–5.2)
Sodium: 142 mmol/L (ref 134–144)
eGFR: 63 mL/min/{1.73_m2} (ref >59)

## 2020-10-22 NOTE — Telephone Encounter (Signed)
No need to complete as she has already done them. Patient aware.

## 2020-10-22 NOTE — Telephone Encounter (Signed)
Patient completed BMP for me on 6/1.  She would like to know if she still needs to complete a BMP tomorrow for preop labs for her EGD on Monday, 6/6.

## 2020-10-22 NOTE — Telephone Encounter (Signed)
Results for labs in epic

## 2020-10-23 ENCOUNTER — Other Ambulatory Visit (HOSPITAL_COMMUNITY): Admission: RE | Admit: 2020-10-23 | Payer: Commercial Managed Care - PPO | Source: Ambulatory Visit

## 2020-10-26 ENCOUNTER — Other Ambulatory Visit: Payer: Self-pay

## 2020-10-26 ENCOUNTER — Ambulatory Visit (HOSPITAL_COMMUNITY): Payer: Commercial Managed Care - PPO | Admitting: Anesthesiology

## 2020-10-26 ENCOUNTER — Encounter (HOSPITAL_COMMUNITY)
Admission: RE | Disposition: A | Discharge status: Home / Self Care | Payer: Self-pay | Source: Home / Self Care | Attending: Internal Medicine

## 2020-10-26 ENCOUNTER — Encounter (HOSPITAL_COMMUNITY): Payer: Self-pay

## 2020-10-26 ENCOUNTER — Ambulatory Visit (HOSPITAL_COMMUNITY)
Admission: RE | Admit: 2020-10-26 | Discharge: 2020-10-26 | Disposition: A | Discharge status: Home / Self Care | Payer: Commercial Managed Care - PPO | Attending: Internal Medicine | Admitting: Internal Medicine

## 2020-10-26 DIAGNOSIS — R634 Abnormal weight loss: Secondary | ICD-10-CM | POA: Insufficient documentation

## 2020-10-26 DIAGNOSIS — G35 Multiple sclerosis: Secondary | ICD-10-CM | POA: Diagnosis not present

## 2020-10-26 DIAGNOSIS — E785 Hyperlipidemia, unspecified: Secondary | ICD-10-CM | POA: Insufficient documentation

## 2020-10-26 DIAGNOSIS — K2970 Gastritis, unspecified, without bleeding: Secondary | ICD-10-CM | POA: Diagnosis not present

## 2020-10-26 DIAGNOSIS — K529 Noninfective gastroenteritis and colitis, unspecified: Secondary | ICD-10-CM | POA: Insufficient documentation

## 2020-10-26 DIAGNOSIS — R197 Diarrhea, unspecified: Secondary | ICD-10-CM

## 2020-10-26 DIAGNOSIS — I10 Essential (primary) hypertension: Secondary | ICD-10-CM | POA: Diagnosis not present

## 2020-10-26 DIAGNOSIS — R112 Nausea with vomiting, unspecified: Secondary | ICD-10-CM | POA: Diagnosis not present

## 2020-10-26 DIAGNOSIS — Z79899 Other long term (current) drug therapy: Secondary | ICD-10-CM | POA: Insufficient documentation

## 2020-10-26 DIAGNOSIS — K222 Esophageal obstruction: Secondary | ICD-10-CM | POA: Diagnosis not present

## 2020-10-26 DIAGNOSIS — Z85828 Personal history of other malignant neoplasm of skin: Secondary | ICD-10-CM | POA: Insufficient documentation

## 2020-10-26 DIAGNOSIS — Z87891 Personal history of nicotine dependence: Secondary | ICD-10-CM | POA: Diagnosis not present

## 2020-10-26 DIAGNOSIS — Z6828 Body mass index (BMI) 28.0-28.9, adult: Secondary | ICD-10-CM | POA: Diagnosis not present

## 2020-10-26 DIAGNOSIS — Z882 Allergy status to sulfonamides status: Secondary | ICD-10-CM | POA: Diagnosis not present

## 2020-10-26 DIAGNOSIS — Z880 Allergy status to penicillin: Secondary | ICD-10-CM | POA: Insufficient documentation

## 2020-10-26 DIAGNOSIS — R12 Heartburn: Secondary | ICD-10-CM | POA: Diagnosis not present

## 2020-10-26 HISTORY — PX: ESOPHAGOGASTRODUODENOSCOPY (EGD) WITH PROPOFOL: SHX5813

## 2020-10-26 HISTORY — PX: BIOPSY: SHX5522

## 2020-10-26 SURGERY — ESOPHAGOGASTRODUODENOSCOPY (EGD) WITH PROPOFOL
Anesthesia: General

## 2020-10-26 MED ORDER — PROPOFOL 10 MG/ML IV BOLUS
INTRAVENOUS | Status: DC | PRN
  Administered 2020-10-26 (×4): 50 mg via INTRAVENOUS

## 2020-10-26 MED ORDER — LACTATED RINGERS IV SOLN: INTRAVENOUS | Status: DC

## 2020-10-26 NOTE — Op Note (Signed)
[facility] Patient Name: Krystal Silva Procedure Date: 10/26/2020 1:42 PM MRN: [id number] Date of Birth: [birth year] Attending MD: Elon Alas. Abbey Chatters DO CSN: [id number] Age: 65 Admit Type: Outpatient Procedure:                Upper GI endoscopy Indications:              Heartburn, Diarrhea, Nausea Providers:                Elon Alas. Abbey Chatters, DO, Charlsie Quest. Theda Sers RN, RN,                            Randa Spike, Technician Referring MD:              Medicines:                See the Anesthesia note for documentation of the                            administered medications Complications:            No immediate complications. Estimated Blood Loss:     Estimated blood loss was minimal. Procedure:                Pre-Anesthesia Assessment:                           - The anesthesia plan was to use monitored                            anesthesia care (MAC).                           After obtaining informed consent, the endoscope was                            passed under direct vision. Throughout the                            procedure, the patient's blood pressure, pulse, and                            oxygen saturations were monitored continuously. The                            [id number]-H190 ([id number]) scope was introduced through the                            mouth, and advanced to the second part of duodenum.                            The upper GI endoscopy was accomplished without                            difficulty. The patient tolerated the procedure                            well. Scope In:  1:53:48 PM Scope Out: 1:57:18 PM Total Procedure Duration: 0 hours 3 minutes 30 seconds  Findings:      There is no endoscopic evidence of bleeding, areas of erosion,       esophagitis, hiatal hernia, ulcerations or varices in the entire       esophagus.      Patchy moderate inflammation characterized by erosions and erythema was       found in the entire examined stomach. Biopsies  were taken with a cold       forceps for Helicobacter pylori testing.      The duodenal bulb, first portion of the duodenum and second portion of       the duodenum were normal. Biopsies for histology were taken with a cold       forceps for evaluation of celiac disease.      A non-obstructing Schatzki ring was found in the lower third of the       esophagus. Patient without dysphagia so dilation not performed. Impression:               - Gastritis. Biopsied.                           - Normal duodenal bulb, first portion of the                            duodenum and second portion of the duodenum.                            Biopsied.                           - Non-obstructing Schatzki ring. Moderate Sedation:      Per Anesthesia Care Recommendation:           - Patient has a contact number available for                            emergencies. The signs and symptoms of potential                            delayed complications were discussed with the                            patient. Return to normal activities tomorrow.                            Written discharge instructions were provided to the                            patient.                           - Resume previous diet.                           - Continue present medications.                           - Await pathology  results.                           - Use Protonix (pantoprazole) 40 mg PO daily.                           - Return to GI clinic in 3 months. Procedure Code(s):        --- Professional ---                           [phone], Esophagogastroduodenoscopy, flexible,                            transoral; with biopsy, single or multiple Diagnosis Code(s):        --- Professional ---                           K29.70, Gastritis, unspecified, without bleeding                           R12, Heartburn                           R19.7, Diarrhea, unspecified                           R11.0, Nausea CPT copyright 2019  American Medical Association. All rights reserved. The codes documented in this report are preliminary and upon coder review may  be revised to meet current compliance requirements. Elon Alas. Abbey Chatters, DO [place] Abbey Chatters, DO 10/26/2020 2:09:07 PM This report has been signed electronically. Number of Addenda: 0

## 2020-10-26 NOTE — Transfer of Care (Signed)
Immediate Anesthesia Transfer of Care Note  Patient: Krystal Silva  Procedure(s) Performed: ESOPHAGOGASTRODUODENOSCOPY (EGD) WITH PROPOFOL (N/A ) BIOPSY  Patient Location: Endoscopy Unit  Anesthesia Type:General  Level of Consciousness: awake  Airway & Oxygen Therapy: Patient Spontanous Breathing  Post-op Assessment: Report given to RN  Post vital signs: Reviewed and stable  Last Vitals:  Vitals Value Taken Time  BP    Temp 36.8 C 10/26/20 1403  Pulse    Resp    SpO2      Last Pain:  Vitals:   10/26/20 1403  TempSrc: Oral  PainSc: 0-No pain      Patients Stated Pain Goal: 9 (82/95/62 1308)  Complications: No complications documented.

## 2020-10-26 NOTE — Interval H&P Note (Signed)
History and Physical Interval Note:  10/26/2020 1:01 PM  Krystal Silva [facility]  has presented today for surgery, with the diagnosis of GERD, nausea/vomiting.  The various methods of treatment have been discussed with the patient and family. After consideration of risks, benefits and other options for treatment, the patient has consented to  Procedure(s) with comments: ESOPHAGOGASTRODUODENOSCOPY (EGD) WITH PROPOFOL (N/A) - 2:00pm as a surgical intervention.  The patient's history has been reviewed, patient examined, no change in status, stable for surgery.  I have reviewed the patient's chart and labs.  Questions were answered to the patient's satisfaction.     Eloise Harman

## 2020-10-26 NOTE — Anesthesia Preprocedure Evaluation (Addendum)
Anesthesia Evaluation  Patient identified by MRN, date of birth, ID band Patient awake    Reviewed: Allergy & Precautions, NPO status , Patient's Chart, lab work & pertinent test results  History of Anesthesia Complications Negative for: history of anesthetic complications  Airway Mallampati: II  TM Distance: >3 FB Neck ROM: Full    Dental  (+) Dental Advisory Given,    Pulmonary former smoker,    Pulmonary exam normal breath sounds clear to auscultation       Cardiovascular Exercise Tolerance: Good hypertension, Pt. on medications Normal cardiovascular exam Rhythm:Regular Rate:Normal     Neuro/Psych PSYCHIATRIC DISORDERS Anxiety Depression  Neuromuscular disease (multiple sclerosis, RLS, gait disturbance)    GI/Hepatic negative GI ROS, Neg liver ROS, GERD  Medicated,  Endo/Other  negative endocrine ROS  Renal/GU negative Renal ROS     Musculoskeletal  (+) Arthritis , Osteoarthritis,    Abdominal   Peds  Hematology negative hematology ROS (+)   Anesthesia Other Findings   Reproductive/Obstetrics negative OB ROS                             Anesthesia Physical  Anesthesia Plan  ASA: II  Anesthesia Plan: General   Post-op Pain Management:    Induction: Intravenous  PONV Risk Score and Plan: Propofol infusion  Airway Management Planned: Nasal Cannula and Natural Airway  Additional Equipment:   Intra-op Plan:   Post-operative Plan:   Informed Consent: I have reviewed the patients History and Physical, chart, labs and discussed the procedure including the risks, benefits and alternatives for the proposed anesthesia with the patient or authorized representative who has indicated his/her understanding and acceptance.     Dental advisory given  Plan Discussed with: CRNA and Surgeon  Anesthesia Plan Comments:         Anesthesia Quick Evaluation

## 2020-10-26 NOTE — Anesthesia Postprocedure Evaluation (Signed)
Anesthesia Post Note  Patient: Krystal Silva [facility]  Procedure(s) Performed: ESOPHAGOGASTRODUODENOSCOPY (EGD) WITH PROPOFOL (N/A ) BIOPSY  Patient location during evaluation: Endoscopy Anesthesia Type: General Level of consciousness: awake and alert Pain management: pain level controlled Vital Signs Assessment: post-procedure vital signs reviewed and stable Respiratory status: spontaneous breathing Cardiovascular status: blood pressure returned to baseline and stable Postop Assessment: no apparent nausea or vomiting Anesthetic complications: no   No complications documented.   Last Vitals:  Vitals:   10/26/20 1240 10/26/20 1403  BP: (!) 146/84   Pulse: 89   Resp: 18   Temp: 37.1 C 36.8 C  SpO2: 96%     Last Pain:  Vitals:   10/26/20 1403  TempSrc: Oral  PainSc: 0-No pain                 Kalub Morillo

## 2020-10-26 NOTE — Discharge Instructions (Addendum)
EGD Discharge instructions Please read the instructions outlined below and refer to this sheet in the next few weeks. These discharge instructions provide you with general information on caring for yourself after you leave the hospital. Your doctor may also give you specific instructions. While your treatment has been planned according to the most current medical practices available, unavoidable complications occasionally occur. If you have any problems or questions after discharge, please call your doctor. ACTIVITY  You may resume your regular activity but move at a slower pace for the next 24 hours.   Take frequent rest periods for the next 24 hours.   Walking will help expel (get rid of) the air and reduce the bloated feeling in your abdomen.   No driving for 24 hours (because of the anesthesia (medicine) used during the test).   You may shower.   Do not sign any important legal documents or operate any machinery for 24 hours (because of the anesthesia used during the test).  NUTRITION  Drink plenty of fluids.   You may resume your normal diet.   Begin with a light meal and progress to your normal diet.   Avoid alcoholic beverages for 24 hours or as instructed by your caregiver.  MEDICATIONS  You may resume your normal medications unless your caregiver tells you otherwise.  WHAT YOU CAN EXPECT TODAY  You may experience abdominal discomfort such as a feeling of fullness or "gas" pains.  FOLLOW-UP  Your doctor will discuss the results of your test with you.  SEEK IMMEDIATE MEDICAL ATTENTION IF ANY OF THE FOLLOWING OCCUR:  Excessive nausea (feeling sick to your stomach) and/or vomiting.   Severe abdominal pain and distention (swelling).   Trouble swallowing.   Temperature over 101 F (37.8 C).   Rectal bleeding or vomiting of blood.   Your EGD revealed a moderate amount of inflammation in her stomach.  I took biopsies of this to rule infection with bacteria called H.  pylori.  Also took biopsies your small bowel as well.  Await pathology results, my office will contact you.  Continue on pantoprazole.  Follow-up with GI in 2 to 3 months.   I hope you have a great rest of your week!  Elon Alas. Abbey Chatters, D.O. Gastroenterology and Hepatology [facility]

## 2020-10-28 LAB — SURGICAL PATHOLOGY

## 2020-10-29 ENCOUNTER — Other Ambulatory Visit: Payer: Self-pay

## 2020-10-29 ENCOUNTER — Ambulatory Visit (INDEPENDENT_AMBULATORY_CARE_PROVIDER_SITE_OTHER): Payer: Commercial Managed Care - PPO | Admitting: Nurse Practitioner

## 2020-10-29 VITALS — BP 140/70 | HR 79 | Temp 97.9°F | Ht 64.0 in | Wt 168.0 lb

## 2020-10-29 DIAGNOSIS — Z124 Encounter for screening for malignant neoplasm of cervix: Secondary | ICD-10-CM

## 2020-10-29 DIAGNOSIS — Z01419 Encounter for gynecological examination (general) (routine) without abnormal findings: Secondary | ICD-10-CM | POA: Diagnosis not present

## 2020-10-29 DIAGNOSIS — Z23 Encounter for immunization: Secondary | ICD-10-CM | POA: Diagnosis not present

## 2020-10-29 DIAGNOSIS — S65511A Laceration of blood vessel of left index finger, initial encounter: Secondary | ICD-10-CM | POA: Diagnosis not present

## 2020-10-29 DIAGNOSIS — Z780 Asymptomatic menopausal state: Secondary | ICD-10-CM

## 2020-10-29 DIAGNOSIS — Z1151 Encounter for screening for human papillomavirus (HPV): Secondary | ICD-10-CM

## 2020-10-29 NOTE — Progress Notes (Signed)
Subjective:    Patient ID: Krystal Silva, female    DOB: [birth year], 65 y.o.   MRN: [id number]  HPI  The patient comes in today for a wellness visit.    A review of their health history was completed.  A review of medications was also completed.  Any needed refills; none  Eating habits: too good   Falls/  MVA accidents in past few months: none  Regular exercise: nnot too much due to knee pain   Specialist pt sees on regular basis: gastro, neurologist  Preventative health issues were discussed.   Additional concerns: none Same sexual partner. Regular vision and dental exams.  Depression screen [facility] 2/9 06/10/2020 12/09/2019 08/02/2019 09/13/2017 09/09/2016  Decreased Interest 0 0 0 0 0  Down, Depressed, Hopeless 0 1 1 0 0  PHQ - 2 Score 0 1 1 0 0  Altered sleeping 1 1 0 - -  Tired, decreased energy 0 1 0 - -  Change in appetite 1 0 0 - -  Feeling bad or failure about yourself  1 1 1  - -  Trouble concentrating 0 0 0 - -  Moving slowly or fidgety/restless 0 0 0 - -  Suicidal thoughts 0 0 0 - -  PHQ-9 Score 3 4 2  - -  Difficult doing work/chores Not difficult at all Somewhat difficult Somewhat difficult - -  Some recent data might be hidden     Review of Systems  Constitutional:  Negative for activity change, appetite change and fatigue.  Respiratory:  Negative for cough, chest tightness, shortness of breath and wheezing.   Cardiovascular:  Negative for chest pain and leg swelling.  Gastrointestinal:  Negative for abdominal distention, abdominal pain, blood in stool, constipation, diarrhea, nausea and vomiting.  Genitourinary:  Negative for difficulty urinating, dysuria, enuresis, frequency, genital sores, pelvic pain, urgency, vaginal bleeding and vaginal discharge.  Skin:  Positive for wound.       Small healing laceration left thumb      Objective:   Physical Exam Constitutional:      General: She is not in acute distress.    Appearance: She is well-developed.   Neck:     Thyroid: No thyromegaly.     Trachea: No tracheal deviation.     Comments: Thyroid non tender to palpation. No mass or goiter noted.  Cardiovascular:     Rate and Rhythm: Normal rate and regular rhythm.     Heart sounds: Normal heart sounds. No murmur heard. Pulmonary:     Effort: Pulmonary effort is normal.     Breath sounds: Normal breath sounds.  Chest:  Breasts:    Right: No swelling, inverted nipple, mass, skin change, tenderness, axillary adenopathy or supraclavicular adenopathy.     Left: No swelling, inverted nipple, mass, skin change, tenderness, axillary adenopathy or supraclavicular adenopathy.  Abdominal:     General: There is no distension.     Palpations: Abdomen is soft.     Tenderness: There is no abdominal tenderness.  Genitourinary:    Vagina: Normal.     Comments: External GU pale with atrophic changes.  No rash or lesions noted.  Vagina pale and slightly dry.  Cervix normal in appearance.  No CMT.  Bimanual exam no tenderness or obvious masses. Musculoskeletal:     Cervical back: Normal range of motion and neck supple.  Lymphadenopathy:     Cervical: No cervical adenopathy.     Upper Body:     Right upper body: No  supraclavicular, axillary or pectoral adenopathy.     Left upper body: No supraclavicular, axillary or pectoral adenopathy.  Skin:    General: Skin is warm and dry.     Comments: Small superficial healing laceration left thumb.   Neurological:     Mental Status: She is alert and oriented to person, place, and time.  Psychiatric:        Mood and Affect: Mood normal.        Behavior: Behavior normal.        Thought Content: Thought content normal.        Judgment: Judgment normal.  Today's Vitals   10/29/20 1519  BP: 140/70  Pulse: 79  Temp: 97.9 F (36.6 C)  SpO2: 99%  Weight: 168 lb (76.2 kg)  Height: 5\' 4"  (1.626 m)   Body mass index is 28.84 kg/m.  Labs dated 07/31/20 and 10/21/20. Potassium was low at 2.7 but up to 3.5. See  scanned results.  Triglycerides elevated at 241.       Assessment & Plan:   Problem List Items Addressed This Visit   None Visit Diagnoses     Well woman exam    -  Primary   Relevant Orders   IGP, Aptima HPV   Tdap vaccine greater than or equal to 7yo IM (Completed)   DG Bone Density   Laceration of blood vessel of left index finger, initial encounter       Postmenopausal       Relevant Orders   Tdap vaccine greater than or equal to 7yo IM (Completed)   DG Bone Density   Screening for cervical cancer       Relevant Orders   IGP, Aptima HPV   Screening for HPV (human papillomavirus)       Relevant Orders   IGP, Aptima HPV     Continue current medications as directed.  Recommend patient follow low fat diet and activity.  Bone density scheduled. Tdap today.  Return in about 6 months (around 04/30/2021).

## 2020-10-30 ENCOUNTER — Encounter: Payer: Self-pay | Admitting: Nurse Practitioner

## 2020-11-02 ENCOUNTER — Ambulatory Visit (HOSPITAL_COMMUNITY)
Admission: RE | Admit: 2020-11-02 | Discharge: 2020-11-02 | Disposition: A | Discharge status: Home / Self Care | Payer: Commercial Managed Care - PPO | Source: Ambulatory Visit | Attending: Family Medicine | Admitting: Family Medicine

## 2020-11-02 DIAGNOSIS — Z1231 Encounter for screening mammogram for malignant neoplasm of breast: Secondary | ICD-10-CM | POA: Insufficient documentation

## 2020-11-02 LAB — IGP, APTIMA HPV: HPV Aptima: NEGATIVE

## 2020-11-02 LAB — SPECIMEN STATUS REPORT

## 2020-11-03 ENCOUNTER — Encounter (HOSPITAL_COMMUNITY): Payer: Self-pay | Admitting: Internal Medicine

## 2020-11-04 ENCOUNTER — Other Ambulatory Visit (HOSPITAL_COMMUNITY): Payer: Self-pay | Admitting: Family Medicine

## 2020-11-04 DIAGNOSIS — R928 Other abnormal and inconclusive findings on diagnostic imaging of breast: Secondary | ICD-10-CM

## 2020-11-09 NOTE — Progress Notes (Signed)
Scheduled for tomorrow at 8;00 am for additional imaging .

## 2020-11-10 ENCOUNTER — Ambulatory Visit (HOSPITAL_COMMUNITY)
Admission: RE | Admit: 2020-11-10 | Discharge: 2020-11-10 | Disposition: A | Discharge status: Home / Self Care | Payer: Commercial Managed Care - PPO | Source: Ambulatory Visit | Attending: Family Medicine | Admitting: Family Medicine

## 2020-11-10 ENCOUNTER — Ambulatory Visit: Payer: Commercial Managed Care - PPO | Admitting: Dermatology

## 2020-11-10 DIAGNOSIS — R928 Other abnormal and inconclusive findings on diagnostic imaging of breast: Secondary | ICD-10-CM | POA: Insufficient documentation

## 2020-11-11 ENCOUNTER — Other Ambulatory Visit: Payer: Self-pay

## 2020-11-11 ENCOUNTER — Ambulatory Visit (INDEPENDENT_AMBULATORY_CARE_PROVIDER_SITE_OTHER): Payer: Commercial Managed Care - PPO | Admitting: Dermatology

## 2020-11-11 ENCOUNTER — Encounter: Payer: Self-pay | Admitting: Dermatology

## 2020-11-11 DIAGNOSIS — D1801 Hemangioma of skin and subcutaneous tissue: Secondary | ICD-10-CM

## 2020-11-11 DIAGNOSIS — R231 Pallor: Secondary | ICD-10-CM | POA: Diagnosis not present

## 2020-11-11 DIAGNOSIS — L821 Other seborrheic keratosis: Secondary | ICD-10-CM

## 2020-11-11 DIAGNOSIS — L720 Epidermal cyst: Secondary | ICD-10-CM

## 2020-11-11 DIAGNOSIS — Z1283 Encounter for screening for malignant neoplasm of skin: Secondary | ICD-10-CM | POA: Diagnosis not present

## 2020-11-11 DIAGNOSIS — L738 Other specified follicular disorders: Secondary | ICD-10-CM

## 2020-11-22 ENCOUNTER — Encounter: Payer: Self-pay | Admitting: Dermatology

## 2020-11-22 NOTE — Progress Notes (Signed)
   Follow-Up Visit   Subjective  Krystal Silva is a 65 y.o. female who presents for the following: Annual Exam (Patient here today for yearly skin check. No new concerns. Personal history of non mole skin cancer. No personal history of atypical moles or melanoma. No family history of atypical moles, melanoma or non mole skin cancer. ).  General skin examination Location:  Duration:  Quality:  Associated Signs/Symptoms: Modifying Factors:  Severity:  Timing: Context:   Objective  Well appearing patient in no apparent distress; mood and affect are within normal limits. Left Lower Back, Right Abdomen (side) - Upper 4 to 6 mm flattopped textured brown papules  Chest - Medial (Center) Many on chest and abdomen: 1 to 2 mm smooth red dermal papules  Left Thigh - Anterior, Left Upper Arm - Posterior, Right Thigh - Anterior, Right Upper Arm - Posterior Subtle knot-like vascular pattern.  No history of signs or symptoms of autoimmune or connective tissue disorders.  Left Ala Nasi Half millimeter upper dermal white papule  Mid Forehead Two millimeter cream-colored dermal papule with tiny umbilication  Torso - Posterior (Back) Annual skin examination, no new or recurrent nonmelanoma skin cancer, no atypical pigmented lesions.    A full examination was performed including scalp, head, eyes, ears, nose, lips, neck, chest, axillae, abdomen, back, buttocks, bilateral upper extremities, bilateral lower extremities, hands, feet, fingers, toes, fingernails, and toenails. All findings within normal limits unless otherwise noted below.  Areas beneath undergarments not fully examined.   Assessment & Plan    Seborrheic keratosis (2) Right Abdomen (side) - Upper; Left Lower Back  Leave if stable  Cherry angioma Chest - Medial (Center)  No intervention necessary  Livedo reticularis (4) Left Upper Arm - Posterior; Right Upper Arm - Posterior; Left Thigh - Anterior; Right Thigh -  Anterior  No intervention or evaluation currently indicated.  Milia Left Ala Nasi  May remove in future if patient desires.  Sebaceous hyperplasia Mid Forehead  Told of resemblance to early [facility] so if there is growth or bleeding return for biopsy.  Encounter for screening for malignant neoplasm of skin Torso - Posterior (Back)  Annual skin examination.      I, Lavonna Monarch, MD, have reviewed all documentation for this visit.  The documentation on 11/22/20 for the exam, diagnosis, procedures, and orders are all accurate and complete.

## 2020-11-27 ENCOUNTER — Encounter: Payer: Self-pay | Admitting: Nurse Practitioner

## 2020-12-08 ENCOUNTER — Other Ambulatory Visit: Payer: Self-pay

## 2020-12-08 ENCOUNTER — Ambulatory Visit (INDEPENDENT_AMBULATORY_CARE_PROVIDER_SITE_OTHER): Payer: Commercial Managed Care - PPO | Admitting: Family Medicine

## 2020-12-08 VITALS — BP 120/79 | HR 83 | Temp 97.8°F | Ht 64.0 in | Wt 168.0 lb

## 2020-12-08 DIAGNOSIS — E876 Hypokalemia: Secondary | ICD-10-CM

## 2020-12-08 DIAGNOSIS — I10 Essential (primary) hypertension: Secondary | ICD-10-CM

## 2020-12-08 DIAGNOSIS — K529 Noninfective gastroenteritis and colitis, unspecified: Secondary | ICD-10-CM

## 2020-12-08 NOTE — Progress Notes (Signed)
Patient ID: Krystal Silva, female    DOB: Apr 30, 1956, 65 y.o.   MRN: [id number]   Chief Complaint  Patient presents with   Hypertension    6 month follow up    Subjective:    HPI  Krystal Silva- Taking protonix for acid.  Had egd and they gave her ppi.  And didn't really say what caused the vomiting.  Up to 4 imodium per day for diarrhea. Saw Krystal Silva, GI for egd and f/u in 16mo.  No further vomiting since then. Still having diarrhea- taking 2 imodium per day. Never solid stool.  Even if having imodium still has loose stool, but still has some, but less. 3/22- had colonoscopy and no sign for reason of diarrhea. No sign of reason on colonoscopy for diarrhea. Diet changes not helped on the stools.   IBS = history of this but never had the diarrhea to this extent till around Jan 2022  Krystal Silva has a past medical history of Allergy, Basal cell carcinoma (09/04/2019), Diverticulitis, Hypercholesteremia, Hypertension, Insomnia, MS (multiple sclerosis) ([place]), and Vitamin D deficiency.   Outpatient Encounter Medications as of 12/08/2020  Medication Sig   AVONEX PEN 30 MCG/0.5ML AJKT INJECT 30MCG  INTRAMUSCULARLY WEEKLY (Patient taking differently: Inject 30 mcg as directed every Saturday.)   baclofen (LIORESAL) 10 MG tablet Take 1 tablet (10 mg total) by mouth at bedtime. Make take an additional dose if needed   cetirizine (ZYRTEC) 10 MG tablet Take 10 mg by mouth daily as needed for allergies.   Cholecalciferol (VITAMIN D-3) 125 MCG (5000 UT) TABS Take 1 tablet by mouth daily. (Patient taking differently: Take 5,000 Units by mouth daily.)   diphenhydrAMINE (BENADRYL) 25 MG tablet Take 25 mg by mouth at bedtime as needed for allergies.   Gluc-Chonn-MSM-Boswellia-Vit D (GLUCOSAMINE CHONDROITIN + D3 PO) Take 1 tablet by mouth daily.   ibuprofen (ADVIL) 200 MG tablet Take 400 mg by mouth every 6 (six) hours as needed for headache or moderate pain.   imipramine (TOFRANIL) 25 MG  tablet TAKE 1 TO 2 TABLETS BY MOUTH AT BEDTIME   Lidocaine (BLUE-EMU PAIN RELIEF DRY EX) Apply 1 application topically daily.   loperamide (IMODIUM) 2 MG capsule Take 2 mg by mouth as needed for diarrhea or loose stools.   losartan-hydrochlorothiazide (HYZAAR) 100-12.5 MG tablet Take one tablet by mouth every day for blood pressure (Patient taking differently: Take 1 tablet by mouth daily.)   ondansetron (ZOFRAN ODT) 4 MG disintegrating tablet Place 1 tablet (4 mg total) under your tongue every 6-8 hours as needed for nausea/vomiting. (Patient taking differently: Take 4 mg by mouth every 6 (six) hours as needed for nausea or vomiting. Place 1 tablet (4 mg total) under your tongue every 6-8 hours as needed for nausea/vomiting.)   pantoprazole (PROTONIX) 40 MG tablet Take 1 tablet (40 mg total) by mouth daily before breakfast.   potassium chloride (KLOR-CON M10) 10 MEQ tablet Take 1 tablet (10 mEq total) by mouth daily.   Probiotic Product (PROBIOTIC DAILY PO) Take 4 capsules by mouth daily.   sertraline (ZOLOFT) 100 MG tablet Take 1.5 tablets (150 mg total) by mouth daily. (Patient taking differently: Take 100 mg by mouth daily.)   simvastatin (ZOCOR) 40 MG tablet Take one tablet daily at bedtime (Patient taking differently: Take 40 mg by mouth at bedtime.)   No facility-administered encounter medications on file as of 12/08/2020.     Review of Systems  Constitutional:  Negative for chills and  fever.  HENT:  Negative for congestion, rhinorrhea and sore throat.   Respiratory:  Negative for cough, shortness of breath and wheezing.   Cardiovascular:  Negative for chest pain and leg swelling.  Gastrointestinal:  Negative for abdominal pain, diarrhea, nausea and vomiting.  Genitourinary:  Negative for dysuria and frequency.  Musculoskeletal:  Negative for arthralgias and back pain.  Skin:  Negative for rash.  Neurological:  Negative for dizziness, weakness and headaches.    Vitals BP 120/79    Pulse 83   Temp 97.8 F (36.6 C)   Ht 5\' 4"  (1.626 m)   Wt 168 lb (76.2 kg)   SpO2 96%   BMI 28.84 kg/m   Objective:   Physical Exam Vitals and nursing note reviewed.  Constitutional:      General: Krystal Silva is not in acute distress.    Appearance: Normal appearance. Krystal Silva is not ill-appearing.  HENT:     Head: Normocephalic and atraumatic.     Nose: Nose normal.     Mouth/Throat:     Mouth: Mucous membranes are moist.     Pharynx: Oropharynx is clear.  Eyes:     Extraocular Movements: Extraocular movements intact.     Conjunctiva/sclera: Conjunctivae normal.     Pupils: Pupils are equal, round, and reactive to light.  Cardiovascular:     Rate and Rhythm: Normal rate and regular rhythm.     Pulses: Normal pulses.     Heart sounds: Normal heart sounds.  Pulmonary:     Effort: Pulmonary effort is normal.     Breath sounds: Normal breath sounds. No wheezing, rhonchi or rales.  Abdominal:     General: Abdomen is flat. Bowel sounds are normal. There is no distension.     Palpations: Abdomen is soft.     Tenderness: There is no abdominal tenderness. There is no guarding or rebound.     Hernia: No hernia is present.  Musculoskeletal:        General: Normal range of motion.     Right lower leg: No edema.     Left lower leg: No edema.  Skin:    General: Skin is warm and dry.     Findings: No lesion or rash.  Neurological:     General: No focal deficit present.     Mental Status: Krystal Silva is alert and oriented to person, place, and time.  Psychiatric:        Mood and Affect: Mood normal.        Behavior: Behavior normal.     Assessment and Plan   1. Essential hypertension, benign - Basic metabolic panel  2. Chronic diarrhea  3. Hypokalemia - Basic metabolic panel   Htn- stable. Cont meds.  Hypokalemia- will recheck labs, and cont kcl.  Chronic diarrhea- cont with prn imodium and f/u with GI.  Return in about 6 months (around 06/10/2021) for f/u htn, IBS,  gerd.  Addendum- pt has normal potassium on bmp.

## 2020-12-09 LAB — BASIC METABOLIC PANEL
BUN/Creatinine Ratio: 16 (ref 12–28)
BUN: 13 mg/dL (ref 8–27)
CO2: 27 mmol/L (ref 20–29)
Calcium: 9.6 mg/dL (ref 8.7–10.3)
Chloride: 102 mmol/L (ref 96–106)
Creatinine, Ser: 0.83 mg/dL (ref 0.57–1.00)
Glucose: 99 mg/dL (ref 65–99)
Potassium: 3.5 mmol/L (ref 3.5–5.2)
Sodium: 142 mmol/L (ref 134–144)
eGFR: 80 mL/min/{1.73_m2} (ref >59)

## 2020-12-17 ENCOUNTER — Other Ambulatory Visit: Payer: Self-pay | Admitting: Gastroenterology

## 2020-12-17 ENCOUNTER — Other Ambulatory Visit: Payer: Self-pay

## 2020-12-17 ENCOUNTER — Ambulatory Visit (INDEPENDENT_AMBULATORY_CARE_PROVIDER_SITE_OTHER): Payer: Commercial Managed Care - PPO | Admitting: Neurology

## 2020-12-17 ENCOUNTER — Encounter: Payer: Self-pay | Admitting: Neurology

## 2020-12-17 ENCOUNTER — Other Ambulatory Visit: Payer: Self-pay | Admitting: Family Medicine

## 2020-12-17 ENCOUNTER — Telehealth: Payer: Self-pay | Admitting: Neurology

## 2020-12-17 VITALS — BP 132/77 | HR 88 | Ht 64.0 in | Wt 171.5 lb

## 2020-12-17 DIAGNOSIS — F418 Other specified anxiety disorders: Secondary | ICD-10-CM

## 2020-12-17 DIAGNOSIS — K219 Gastro-esophageal reflux disease without esophagitis: Secondary | ICD-10-CM

## 2020-12-17 DIAGNOSIS — I10 Essential (primary) hypertension: Secondary | ICD-10-CM

## 2020-12-17 DIAGNOSIS — R269 Unspecified abnormalities of gait and mobility: Secondary | ICD-10-CM

## 2020-12-17 DIAGNOSIS — G35 Multiple sclerosis: Secondary | ICD-10-CM

## 2020-12-17 DIAGNOSIS — E781 Pure hyperglyceridemia: Secondary | ICD-10-CM

## 2020-12-17 DIAGNOSIS — R5383 Other fatigue: Secondary | ICD-10-CM | POA: Insufficient documentation

## 2020-12-17 DIAGNOSIS — Z79899 Other long term (current) drug therapy: Secondary | ICD-10-CM | POA: Insufficient documentation

## 2020-12-17 DIAGNOSIS — R208 Other disturbances of skin sensation: Secondary | ICD-10-CM | POA: Diagnosis not present

## 2020-12-17 MED ORDER — ARMODAFINIL 200 MG PO TABS: ORAL_TABLET | ORAL | 5 refills | Status: DC

## 2020-12-17 NOTE — Telephone Encounter (Addendum)
MRI brain w/wo contrast [facility] auth: [id number] (12/17/20-01/15/21) via rep Ursula Alert to GI for scheduling

## 2020-12-17 NOTE — Progress Notes (Signed)
[facility]  PATIENT: Krystal Silva DOB: Mar 31, 1956  REFERRING DOCTOR OR PCP:   Coralie Keens SOURCE: patient, records from initial Neurology group and MRI images on PACS/CD  _________________________________   HISTORICAL  CHIEF COMPLAINT:  Chief Complaint  Patient presents with   Follow-up    Rm 2, alone. Here for 6 month MS f/u, pt on Avonex. Pt reports doing well, no new or worsening in sx. Pt had blood work on 5/23, record in epic.     HISTORY OF PRESENT ILLNESS:  Krystal Silva is a 65 y.o.  woman with relapsing remitting MS diagnosed in 2006.  Update 12/17/2020: She is on Avonex and tolerated well.  She has had no exacerbation since 2003 and 2006.   She has no definite new MS symptoms x years.         LFT's , creatinine, WBC and lymphocytes 10/11/2020 were normal.     Her gait is unchanged with mildly reduced balance but more issues with knees than MS.    Legs are strong.  She has mild spasticity, helped by baclofen.  No new sensory changes.  She does have existing dysesthesias in the limbs..   Bladder function is fine.   Vision is doing well and she has appt later this summer.     She notes more fatigue, now occurring throughout the day.   Fatigue is physical and cognitive.   She is generally sleeping well.   No relationship to the Avonex shots. Even on weekends, she is noting more fatigue.   She still does worse in the heat.     She is sleeping well.   Mood is about the same as last year with some depression.   She notes stress at work. .  She is on sertraline and imipramine.    She does not fall asleep in the evenings on weekdays but may on weekends.  She had Covid October 2020(PCR verified).   She has done [employer] vaccination x 2.    She had a basal cell carcinoma removed.  Vit D was low normal at 37 last checked.      GERD is better on Pantoprazole  MS History:   In 2003, she had an episode of  diplopia. An MRI was performed but she was not diagnosed with MS at that time. In 2006, in [place],  she presented with dysesthetic sensations in the legs, mostly around the shin. She had MRI's performed that were more consistent with MS. She also had a lumbar puncture and the CSF was consistent with MS. The CSF showed oligoclonal bands and IgG index of 1.37 (elevated). She started to see Dr. George Hugh and then Dr. Shelia Media in 2010/2011 after moving to the area.    An MRI performed in 2011 was reportedly very similar to the one from 2006. She has been on Avonex therapy since diagnosis..  IMAGING: MRI Brain 04/23/2015 showed  T2/FLAIR hyperintense foci in the periventricular, deep and juxtacortical white matter. The pattern is consistent with multiple sclerosis. There are no enhancing lesions. When compared to the MRI dated 08/12/2013, there is no definite change.  REVIEW OF SYSTEMS: Constitutional: No fevers, chills, sweats, or change in appetite Eyes: No visual changes, double vision, eye pain Ear, nose and throat: No hearing loss, ear pain, nasal congestion, sore throat Cardiovascular: No chest pain, palpitations Respiratory:  No shortness of breath at rest or with exertion.   No wheezes GastrointestinaI: No nausea, vomiting, diarrhea, abdominal  pain, fecal incontinence Genitourinary:  No dysuria, urinary retention or frequency.  No nocturia. Musculoskeletal:  No neck pain, back pain Integumentary: No rash, pruritus, skin lesions Neurological: as above Psychiatric: No depression at this time.  No anxiety Endocrine: No palpitations, diaphoresis, change in appetite, change in weigh or increased thirst Hematologic/Lymphatic:  No anemia, purpura, petechiae. Allergic/Immunologic: No itchy/runny eyes, nasal congestion, recent allergic reactions, rashes  ALLERGIES: Allergies  Allergen Reactions   Cefzil [Cefprozil] Diarrhea   Codeine Nausea And Vomiting   Penicillins Rash   Sulfonamide  Derivatives Rash    HOME MEDICATIONS:  Current Outpatient Medications:    Armodafinil 200 MG TABS, One po qAM, Disp: 30 tablet, Rfl: 5   AVONEX PEN 30 MCG/0.5ML AJKT, INJECT 30MCG  INTRAMUSCULARLY WEEKLY (Patient taking differently: Inject 30 mcg as directed every Saturday.), Disp: 12 each, Rfl: 3   baclofen (LIORESAL) 10 MG tablet, Take 1 tablet (10 mg total) by mouth at bedtime. Make take an additional dose if needed, Disp: 270 tablet, Rfl: 3   cetirizine (ZYRTEC) 10 MG tablet, Take 10 mg by mouth daily as needed for allergies., Disp: , Rfl:    Cholecalciferol (VITAMIN D-3) 125 MCG (5000 UT) TABS, Take 1 tablet by mouth daily. (Patient taking differently: Take 5,000 Units by mouth daily.), Disp: 90 tablet, Rfl: 1   diphenhydrAMINE (BENADRYL) 25 MG tablet, Take 25 mg by mouth at bedtime as needed for allergies., Disp: , Rfl:    Gluc-Chonn-MSM-Boswellia-Vit D (GLUCOSAMINE CHONDROITIN + D3 PO), Take 1 tablet by mouth daily., Disp: , Rfl:    ibuprofen (ADVIL) 200 MG tablet, Take 400 mg by mouth every 6 (six) hours as needed for headache or moderate pain., Disp: , Rfl:    imipramine (TOFRANIL) 25 MG tablet, TAKE 1 TO 2 TABLETS BY MOUTH AT BEDTIME, Disp: 180 tablet, Rfl: 3   Lidocaine (BLUE-EMU PAIN RELIEF DRY EX), Apply 1 application topically daily., Disp: , Rfl:    loperamide (IMODIUM) 2 MG capsule, Take 2 mg by mouth as needed for diarrhea or loose stools., Disp: , Rfl:    losartan-hydrochlorothiazide (HYZAAR) 100-12.5 MG tablet, Take one tablet by mouth every day for blood pressure (Patient taking differently: Take 1 tablet by mouth daily.), Disp: 90 tablet, Rfl: 1   ondansetron (ZOFRAN ODT) 4 MG disintegrating tablet, Place 1 tablet (4 mg total) under your tongue every 6-8 hours as needed for nausea/vomiting. (Patient taking differently: Take 4 mg by mouth every 6 (six) hours as needed for nausea or vomiting. Place 1 tablet (4 mg total) under your tongue every 6-8 hours as needed for  nausea/vomiting.), Disp: 30 tablet, Rfl: 1   pantoprazole (PROTONIX) 40 MG tablet, Take 1 tablet (40 mg total) by mouth daily before breakfast., Disp: 30 tablet, Rfl: 3   potassium chloride (KLOR-CON M10) 10 MEQ tablet, Take 1 tablet (10 mEq total) by mouth daily., Disp: 90 tablet, Rfl: 1   Probiotic Product (PROBIOTIC DAILY PO), Take 4 capsules by mouth daily., Disp: , Rfl:    sertraline (ZOLOFT) 100 MG tablet, Take 1.5 tablets (150 mg total) by mouth daily. (Patient taking differently: Take 100 mg by mouth daily.), Disp: 135 tablet, Rfl: 0   simvastatin (ZOCOR) 40 MG tablet, Take one tablet daily at bedtime (Patient taking differently: Take 40 mg by mouth at bedtime.), Disp: 90 tablet, Rfl: 1  PAST MEDICAL HISTORY: Past Medical History:  Diagnosis Date   Allergy    Basal cell carcinoma 09/04/2019   right parotid area - Refer to  MOHs   Diverticulitis    Hypercholesteremia    Hypertension    Insomnia    MS (multiple sclerosis) ([place])    Vitamin D deficiency     PAST SURGICAL HISTORY: Past Surgical History:  Procedure Laterality Date   BIOPSY  08/03/2020   Procedure: BIOPSY;  Surgeon: Eloise Harman, DO;  [facility];  Service: Endoscopy;;   BIOPSY  10/26/2020   Procedure: BIOPSY;  Surgeon: Eloise Harman, DO;  [facility];  Service: Endoscopy;;   COLONOSCOPY  08/21/2009   COLONOSCOPY WITH PROPOFOL N/A 08/03/2020    Surgeon: Eloise Harman, DO;  internal hemorrhoids, diverticulosis in the sigmoid, descending, and transverse colon, otherwise normal exam.  Biopsies were taken to evaluate for microscopic colitis.  Recommended repeat colonoscopy in 10 years.    ESOPHAGOGASTRODUODENOSCOPY (EGD) WITH PROPOFOL N/A 10/26/2020   Procedure: ESOPHAGOGASTRODUODENOSCOPY (EGD) WITH PROPOFOL;  Surgeon: Eloise Harman, DO;  [facility];  Service: Endoscopy;  Laterality: N/A;  2:00pm   KNEE ARTHROSCOPY WITH MEDIAL MENISECTOMY Right 03/27/2015   Procedure: KNEE  ARTHROSCOPY WITH PARTIAL MEDIAL MENISECTOMY AND MICROFRACTURE;  Surgeon: Carole Civil, MD;  Location: [facility];  Service: Orthopedics;  Laterality: Right;   TONSILLECTOMY     WISDOM TOOTH EXTRACTION  1980's    FAMILY HISTORY: Family History  Problem Relation Age of Onset   Hypertension Mother    Hyperlipidemia Father    Hearing loss Father    Hyperlipidemia Brother    Diabetes Brother    Vision loss Maternal Grandmother    Vision loss Paternal Grandmother    Cancer Other        breast   Inflammatory bowel disease Neg Hx    Colon cancer Neg Hx     SOCIAL HISTORY:  Social History   Socioeconomic History   Marital status: Divorced    Spouse name: Not on file   Number of children: Not on file   Years of education: 18   Highest education level: Not on file  Occupational History   Occupation: work   Tobacco Use   Smoking status: Former    Packs/day: 1.50    Years: 30.00    Pack years: 45.00    Types: Cigarettes    Start date: 12/26/1974    Quit date: 06/22/2004    Years since quitting: 16.4   Smokeless tobacco: Never  Vaping Use   Vaping Use: Never used  Substance and Sexual Activity   Alcohol use: Yes    Alcohol/week: 1.0 standard drink    Types: 1 Cans of beer per week    Comment: rare social   Drug use: No   Sexual activity: Not Currently  Other Topics Concern   Not on file  Social History Narrative   Coffee in the morning couple of cups    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[employer]   Social Determinants of Health   Financial Resource Strain: Not on file  Food Insecurity: Not on file  Transportation Needs: Not on file  Physical Activity: Not on file  Stress: Not on file  Social Connections: Not on file  Intimate Partner Violence: Not on file     PHYSICAL EXAM  Vitals:   12/17/20 0935  BP: 132/77  Pulse: 88  Weight: 171 lb 8 oz (77.8 kg)  Height: '5\' 4"'$  (1.626 m)    Body mass index is 29.44  kg/m.   General: The  patient is well-developed and well-nourished and in no acute distress  Neurologic Exam  Mental status: The patient is alert and oriented x 3 at the time of the examination. The patient has apparent normal recent and remote memory, with an apparently normal attention span and concentration ability.   Speech is normal.  Cranial nerves: Extraocular movements are full.  Her facial strength and sensation is normal.  The trapezius strength is normal.  Hearing is normal and symmetric.  Motor:  Muscle bulk is normal.   Tone is mildly increased in legs. Strength is  5 / 5 in all 4 extremities except 4+/5 in right EHL.   Sensory: Sensory testing intact to touch and vibration in the arms and legs.  Coordination: Finger-nose-finger and heel-to-shin is performed well.  Gait and station: Station is normal.  The gait has a mildly reduced stride but mostly due to right knee pain.  The tandem gait is mildly wide.  Romberg is negative.   Reflexes: Deep tendon reflexes are symmetric and increased bilaterally with  2 beats non-sustained clonus at ankles.       Multiple sclerosis ([place]) - Plan: MR BRAIN W WO CONTRAST  Dysesthesia  Gait disturbance  Depression with anxiety  Other fatigue  High risk medication use  1.   continue Avonex.  Check MRI of the brain to determine if she is having any subclinical progression.  If present, we would need to consider a different DMT.   2.   Continue baclofen for spasticity and sertraline and imipramine for mood these have recently been refilled. 3.   Armodafinil for fatigue and sleepiness return in 6 months or sooner if there are new or worsening neurologic symptoms.  Bralin Garry A. Felecia Shelling, MD, PhD [id number], [id number] PM Certified in Neurology, Clinical Neurophysiology, Sleep Medicine, Pain Medicine and Neuroimaging  [facility] [address], [place], [place] [address] [phone]

## 2020-12-23 ENCOUNTER — Encounter: Payer: Self-pay | Admitting: Family Medicine

## 2020-12-25 ENCOUNTER — Other Ambulatory Visit: Payer: Self-pay | Admitting: Family Medicine

## 2020-12-25 DIAGNOSIS — F418 Other specified anxiety disorders: Secondary | ICD-10-CM

## 2020-12-31 ENCOUNTER — Encounter (INDEPENDENT_AMBULATORY_CARE_PROVIDER_SITE_OTHER): Payer: Self-pay

## 2020-12-31 NOTE — Telephone Encounter (Signed)
You are very welcome! I wish there was more that we could do myself. Of course me and Coralyn Helling are going to be the dream duo ;)

## 2020-12-31 NOTE — Telephone Encounter (Signed)
Noted  So far Krystal Silva is working on this right now. When I had looked at this before this pt had just had these labs done a month before so according to them it shouldn't have been done again especially if there were no flagged labs

## 2020-12-31 NOTE — Telephone Encounter (Signed)
I spoke with [facility] billing and they informed me that [employer] came back stating that the cpt code [phone] for the Gastrointestinal Panel was deemed experimental. I contacted [employer] and spoke with claim specialist Felicita Gage (Ref # [id number]) he informed me that it was not a coding issue that the particular cpt code will always be deemed experimental and that her insurance will never cover that certain cpt code. I told him that according to the pt she had one done in Apr at an urgent care that is also part of our health system and it was also stating the same, but the billing dept with the health system was able to get it covered by submitting a different dx code. The claims specialist Felicita Gage stated that, that is not the case that he can see that claim as well and it is still being denied for the same reason. Unfortunately this is not a claim that can be paid by [place] resubmitting any codes or documentation.

## 2021-02-09 NOTE — Progress Notes (Signed)
Referring Provider: Erven Colla, DO Primary Care Physician:  Erven Colla, DO Primary GI Physician: Dr. Abbey Chatters  Chief Complaint  Patient presents with   Follow-up    HPI:   Krystal Silva is a 65 y.o. female presenting today for follow-up of diarrhea, GERD, and nausea with vomiting.    Last seen in our office 10/12/2020 for the same.  She reported nausea/vomiting every 2 weeks for 3 days since February with associated acute on chronic diarrhea without identified trigger.  Felt fairly well between episodes. GERD not adequately managed on famotidine twice daily 3 days a week.  Infrequent use of ibuprofen.  When having nausea/vomiting, she will have up to 8 BMs per day, otherwise 1 watery BM per day which had been present for about 2 years.  Denied Associated Abdominal Pain, BRBPR, Melena. Using Imodium as needed, about once a week.  Usually, diarrhea is postprandial and urgent. Documented weight loss of 16 pounds over the last 4 months.  Denied new medications, sick contacts.  She was scheduled for EGD, stool studies, TSH, celiac screen, and CMP ordered. She was also started on Protonix 40 mg daily.   EGD completed 10/26/2020: Gastritis s/p biopsy (negative for H. pylori), normal examined duodenum biopsied (benign), nonobstructing Schatzki's ring, not manipulated. Labs completed: C. difficile and GI pathogen panel negative.  Celiac screen negative.  TSH within normal limits.  CBC within normal limits.  CMP with hypokalemia 3.0.   Prior evaluation of GI symptoms has included:  - Colonoscopy March 2022 with nonbleeding internal hemorrhoids, diverticulosis in the sigmoid, descending, and transverse colon, otherwise normal exam with random colon biopsies revealing focal active colitis with main consideration of acute self-limited infectious colitis versus NSAID induced colitis. - C. difficile and GI pathogen panel negative March 2022.  - CT A/P in May 2022 with liquid stool throughout much  of the colon, no bowel wall thickening. - Admitted to NSAID use in the setting of MS, but not routinely.  Today:  Nausea/vomiting resolved with Protonix 40 mg daily.  States this has become her best friend.  Reflux also well controlled.  Denies dysphagia.  Bowel movements have also improved somewhat.  Still with mushy stools, but not watery.  Usually, 2 bowel movements per day.  Typically after meals.  States this is chronic and pretty much her baseline.  The intermittent flares of diarrhea have also resolved.  She will use Imodium if needed when going out.  Admits to intermittent abdominal cramping relieved by bowel movement or passing gas which does occur several times a week.  States she is not interested in any additional medications.  She is currently satisfied with her symptoms and prefers to continue to only use Imodium if needed.  Denies BRBPR or melena.  Weight loss resolved.  Gained 13 pounds since last visit.  Past Medical History:  Diagnosis Date   Allergy    Basal cell carcinoma 09/04/2019   right parotid area - Refer to [facility]   Diverticulitis    GERD (gastroesophageal reflux disease)    Hypercholesteremia    Hypertension    Insomnia    MS (multiple sclerosis) ([place])    Vitamin D deficiency     Past Surgical History:  Procedure Laterality Date   BIOPSY  08/03/2020   Procedure: BIOPSY;  Surgeon: Eloise Harman, DO;  [facility];  Service: Endoscopy;;   BIOPSY  10/26/2020   Procedure: BIOPSY;  Surgeon: Eloise Harman, DO;  [facility];  Service: Endoscopy;;   COLONOSCOPY  08/21/2009   COLONOSCOPY WITH PROPOFOL N/A 08/03/2020    Surgeon: Eloise Harman, DO;  internal hemorrhoids, diverticulosis in the sigmoid, descending, and transverse colon, otherwise normal exam.  Biopsies were taken to evaluate for microscopic colitis.  Recommended repeat colonoscopy in 10 years.    ESOPHAGOGASTRODUODENOSCOPY (EGD) WITH PROPOFOL N/A 10/26/2020   Surgeon:  Eloise Harman, DO; Gastritis s/p biopsy (negative for H. pylori), normal examined duodenum biopsied (benign), nonobstructing Schatzki's ring, not manipulated.   KNEE ARTHROSCOPY WITH MEDIAL MENISECTOMY Right 03/27/2015   Procedure: KNEE ARTHROSCOPY WITH PARTIAL MEDIAL MENISECTOMY AND MICROFRACTURE;  Surgeon: Carole Civil, MD;  Location: [facility];  Service: Orthopedics;  Laterality: Right;   TONSILLECTOMY     WISDOM TOOTH EXTRACTION  1980's    Current Outpatient Medications  Medication Sig Dispense Refill   Armodafinil 200 MG TABS One po qAM 30 tablet 5   AVONEX PEN 30 MCG/0.5ML AJKT INJECT 30MCG  INTRAMUSCULARLY WEEKLY (Patient taking differently: Inject 30 mcg as directed every Saturday.) 12 each 3   baclofen (LIORESAL) 10 MG tablet Take 1 tablet (10 mg total) by mouth at bedtime. Make take an additional dose if needed 270 tablet 3   cetirizine (ZYRTEC) 10 MG tablet Take 10 mg by mouth daily as needed for allergies.     Cholecalciferol (VITAMIN D-3) 125 MCG (5000 UT) TABS Take 1 tablet by mouth daily. (Patient taking differently: Take 5,000 Units by mouth daily.) 90 tablet 1   diphenhydrAMINE (BENADRYL) 25 MG tablet Take 25 mg by mouth at bedtime as needed for allergies.     ibuprofen (ADVIL) 200 MG tablet Take 400 mg by mouth every 6 (six) hours as needed for headache or moderate pain.     imipramine (TOFRANIL) 25 MG tablet TAKE 1 TO 2 TABLETS BY MOUTH AT BEDTIME 180 tablet 3   Lidocaine (BLUE-EMU PAIN RELIEF DRY EX) Apply 1 application topically daily.     loperamide (IMODIUM) 2 MG capsule Take 2 mg by mouth as needed for diarrhea or loose stools.     losartan-hydrochlorothiazide (HYZAAR) 100-12.5 MG tablet TAKE 1 TABLET BY MOUTH EVERY DAY FOR BLOOD PRESSURE 90 tablet 0   pantoprazole (PROTONIX) 40 MG tablet TAKE 1 TABLET BY MOUTH DAILY BEFORE BREAKFAST 90 tablet 1   potassium chloride (KLOR-CON M10) 10 MEQ tablet Take 1 tablet (10 mEq total) by mouth daily. 90 tablet 1   Probiotic  Product (PROBIOTIC DAILY PO) Take 4 capsules by mouth daily.     sertraline (ZOLOFT) 100 MG tablet TAKE 1.5 TABLETS (150MG  TOTAL) BY MOUTH DAILY 135 tablet 0   simvastatin (ZOCOR) 40 MG tablet TAKE ONE TABLET BY MOUTH DAILY AT BEDTIME 90 tablet 0   ondansetron (ZOFRAN ODT) 4 MG disintegrating tablet Place 1 tablet (4 mg total) under your tongue every 6-8 hours as needed for nausea/vomiting. (Patient not taking: Reported on 02/10/2021) 30 tablet 1   No current facility-administered medications for this visit.    Allergies as of 02/10/2021 - Review Complete 02/10/2021  Allergen Reaction Noted   Cefzil [cefprozil] Diarrhea 03/31/2019   Codeine Nausea And Vomiting    Penicillins Rash 08/11/2009   Sulfonamide derivatives Rash     Family History  Problem Relation Age of Onset   Hypertension Mother    Hyperlipidemia Father    Hearing loss Father    Hyperlipidemia Brother    Diabetes Brother    Vision loss Maternal Grandmother    Vision  loss Paternal Grandmother    Cancer Other        breast   Inflammatory bowel disease Neg Hx    Colon cancer Neg Hx     Social History   Socioeconomic History   Marital status: Divorced    Spouse name: Not on file   Number of children: Not on file   Years of education: 18   Highest education level: Not on file  Occupational History   Occupation: work   Tobacco Use   Smoking status: Former    Packs/day: 1.50    Years: 30.00    Pack years: 45.00    Types: Cigarettes    Start date: 12/26/1974    Quit date: 06/22/2004    Years since quitting: 16.6   Smokeless tobacco: Never  Vaping Use   Vaping Use: Never used  Substance and Sexual Activity   Alcohol use: Yes    Alcohol/week: 1.0 standard drink    Types: 1 Cans of beer per week    Comment: rare social   Drug use: No   Sexual activity: Not Currently  Other Topics Concern   Not on file  Social History Narrative   Coffee in the morning couple of cups    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   WORKS  AS A LIBRARIAN FOR [employer]   Social Determinants of Health   Financial Resource Strain: Not on file  Food Insecurity: Not on file  Transportation Needs: Not on file  Physical Activity: Not on file  Stress: Not on file  Social Connections: Not on file    Review of Systems: Gen: Denies fever, chills, cold or flulike symptoms, presyncope, syncope, unintentional weight loss. CV: Denies chest pain, palpitations. Resp: Denies dyspnea or cough. GI: See HPI Heme: See HPI  Physical Exam: BP 134/82   Pulse 80   Temp (!) 97.4 F (36.3 C) (Temporal)   Ht 5\' 4"  (1.626 m)   Wt 178 lb 6.4 oz (80.9 kg)   BMI 30.62 kg/m  General:   Alert and oriented. No distress noted. Pleasant and cooperative.  Head:  Normocephalic and atraumatic. Eyes:  Conjuctiva clear without scleral icterus. Heart:  S1, S2 present without murmurs appreciated. Lungs:  Clear to auscultation bilaterally. No wheezes, rales, or rhonchi. No distress.  Abdomen:  +BS, soft, non-tender and non-distended. No rebound or guarding. No HSM or masses noted. Msk:  Symmetrical without gross deformities. Normal posture. Extremities:  Without edema. Neurologic:  Alert and  oriented x4 Psych: Normal mood and affect.    Assessment: 65 year old female presenting today for follow-up of diarrhea, GERD, and nausea and vomiting.    Nausea/Vomiting:  Likely secondary to GERD, now resolved with Protonix 40 mg daily.  Prior weight loss resolved.   GERD:  Well-controlled on Protonix 40 mg daily. No alarm symptoms. EGD on file June 2022 with gastritis (pathology negative for H. pylori), benign duodenal biopsy, nonobstructing Schatzki's ring.    Diarrhea: Improved and back to her baseline of about 2 mushy stools daily, typically after eating.  Admits to intermittent abdominal cramping relieved by bowel movements or passing gas which does occur several times a week, but no alarm symptoms.  Suspect she may have a component of  IBS.  Discussed trial of dicyclomine or Levsin, but patient declined.  She is satisfied with using Imodium as needed.  Prior laboratory evaluation with C. difficile and GI pathogen panel negative, TSH within normal limits, celiac screen negative. Colonoscopy March 2022, due for  repeat in 2032.     Plan: Continue Protonix 40 mg daily. Continue Imodium as needed. Trial lactose-free diet.  If this improves diarrhea, continue lactose-free diet or take Lactaid tablets prior to dairy consumption. Follow-up in 6 months.   Krystal Altes, PA-C [facility]

## 2021-02-10 ENCOUNTER — Encounter: Payer: Self-pay | Admitting: Internal Medicine

## 2021-02-10 ENCOUNTER — Encounter: Payer: Self-pay | Admitting: Gastroenterology

## 2021-02-10 ENCOUNTER — Other Ambulatory Visit: Payer: Self-pay

## 2021-02-10 ENCOUNTER — Ambulatory Visit (INDEPENDENT_AMBULATORY_CARE_PROVIDER_SITE_OTHER): Payer: Commercial Managed Care - PPO | Admitting: Gastroenterology

## 2021-02-10 VITALS — BP 134/82 | HR 80 | Temp 97.4°F | Ht 64.0 in | Wt 178.4 lb

## 2021-02-10 DIAGNOSIS — R197 Diarrhea, unspecified: Secondary | ICD-10-CM | POA: Diagnosis not present

## 2021-02-10 DIAGNOSIS — K219 Gastro-esophageal reflux disease without esophagitis: Secondary | ICD-10-CM | POA: Diagnosis not present

## 2021-02-10 DIAGNOSIS — R112 Nausea with vomiting, unspecified: Secondary | ICD-10-CM | POA: Diagnosis not present

## 2021-02-10 NOTE — Patient Instructions (Addendum)
Continue pantoprazole 40 mg daily 30 minutes before breakfast.  Continue to use Imodium as needed for diarrhea.  You may try following a lactose-free diet to see if this improves your diarrhea. If you notice your diarrhea improves with following a lactose-free diet, this suggest lactose intolerance.  In this case, you may avoid lactose completely or try taking Lactaid tablets prior to dairy consumption.  We will follow-up with you in 6 months.  Do not hesitate to call if you have any questions or concerns prior to your next visit.  It was great to see you today!  I am so glad you are feeling much better!   Aliene Altes, PA-C [facility]

## 2021-02-11 ENCOUNTER — Encounter: Payer: Self-pay | Admitting: Gastroenterology

## 2021-03-17 ENCOUNTER — Telehealth: Payer: Self-pay | Admitting: Neurology

## 2021-03-17 NOTE — Telephone Encounter (Signed)
MR Brain w/wo contrast Dr. Arlyss Queen Josem Kaufmann: [id number] (exp. 03/17/21 to 06/15/21). Patient is scheduled at [facility] for 03/24/21.

## 2021-03-24 ENCOUNTER — Ambulatory Visit: Payer: Commercial Managed Care - PPO

## 2021-03-24 DIAGNOSIS — G35 Multiple sclerosis: Secondary | ICD-10-CM | POA: Diagnosis not present

## 2021-03-24 MED ORDER — GADOBENATE DIMEGLUMINE 529 MG/ML IV SOLN
15.0000 mL | Freq: Once | INTRAVENOUS | Status: AC | PRN
Start: 2021-03-24 — End: 2021-03-24
  Administered 2021-03-24: 15 mL via INTRAVENOUS

## 2021-03-29 ENCOUNTER — Telehealth: Payer: Self-pay

## 2021-03-29 ENCOUNTER — Other Ambulatory Visit: Payer: Self-pay | Admitting: Family Medicine

## 2021-03-29 DIAGNOSIS — F418 Other specified anxiety disorders: Secondary | ICD-10-CM

## 2021-03-29 NOTE — Telephone Encounter (Signed)
Faxed PA form and OV notes to [facility] [facility], [phone].

## 2021-03-31 NOTE — Telephone Encounter (Signed)
Approved from 03/30/21-03/30/22

## 2021-04-13 ENCOUNTER — Other Ambulatory Visit: Payer: Self-pay | Admitting: Family Medicine

## 2021-04-13 DIAGNOSIS — E781 Pure hyperglyceridemia: Secondary | ICD-10-CM

## 2021-05-10 ENCOUNTER — Other Ambulatory Visit: Payer: Self-pay | Admitting: *Deleted

## 2021-05-10 DIAGNOSIS — G35 Multiple sclerosis: Secondary | ICD-10-CM

## 2021-05-10 MED ORDER — AVONEX PEN 30 MCG/0.5ML IM AJKT: 30.0000 ug | AUTO-INJECTOR | INTRAMUSCULAR | 3 refills | Status: DC

## 2021-06-15 ENCOUNTER — Other Ambulatory Visit: Payer: Self-pay | Admitting: Gastroenterology

## 2021-06-15 DIAGNOSIS — K219 Gastro-esophageal reflux disease without esophagitis: Secondary | ICD-10-CM

## 2021-06-23 ENCOUNTER — Ambulatory Visit (INDEPENDENT_AMBULATORY_CARE_PROVIDER_SITE_OTHER): Payer: Commercial Managed Care - PPO | Admitting: Neurology

## 2021-06-23 ENCOUNTER — Encounter: Payer: Self-pay | Admitting: Neurology

## 2021-06-23 VITALS — BP 126/98 | HR 93 | Ht 64.0 in | Wt 176.0 lb

## 2021-06-23 DIAGNOSIS — R269 Unspecified abnormalities of gait and mobility: Secondary | ICD-10-CM

## 2021-06-23 DIAGNOSIS — R208 Other disturbances of skin sensation: Secondary | ICD-10-CM | POA: Diagnosis not present

## 2021-06-23 DIAGNOSIS — G35 Multiple sclerosis: Secondary | ICD-10-CM | POA: Diagnosis not present

## 2021-06-23 DIAGNOSIS — Z79899 Other long term (current) drug therapy: Secondary | ICD-10-CM

## 2021-06-23 DIAGNOSIS — F418 Other specified anxiety disorders: Secondary | ICD-10-CM

## 2021-06-23 DIAGNOSIS — R5383 Other fatigue: Secondary | ICD-10-CM | POA: Diagnosis not present

## 2021-06-23 NOTE — Progress Notes (Signed)
[facility]  PATIENT: Krystal Silva DOB: Jul 18, 1955  REFERRING DOCTOR OR PCP:   Coralie Keens SOURCE: patient, records from initial Neurology group and MRI images on PACS/CD  _________________________________     HISTORICAL  CHIEF COMPLAINT:  Chief Complaint  Patient presents with   Follow-up    Rm 2, alone. Here for 6 month MS f/u, on Avonex and tolerating well. No new or worsening in sx. Per pt MS is stable.     HISTORY OF PRESENT ILLNESS:  Krystal Silva is a 66 y.o.  woman with relapsing remitting MS diagnosed in 2006.  Update 06/23/2021: She is on Avonex and tolerated well.  She has had no exacerbation since 2003 and 2006.   She has no definite new MS symptoms x years.         LFT's , creatinine, WBC and lymphocytes 10/11/2020 were normal.   MRI of the brain 03/24/2021 showed multiple supratentorial WM lesions in pattern c/w MS (mostly periventricular radially oriented).  No new lesions and no change compared to 2019     Her gait is unchanged.   Her balance is slightly off, worse at times.   Knees bother gait more than MS.   Legs are strong.  She has mild spasticity, helped by baclofen.  No new sensory changes.  She does have existing dysesthesias in the limbs..   Bladder function is fine.   Vision is mostly ok but cloudy if tired  abd longer time looking at monitor.  She sees eye doctor in March.     She notes more fatigue, now occurring throughout the day.   Fatigue is physical and cognitive.   She is generally sleeping well.   No relationship to the Avonex shots. Even on weekends, she is noting more fatigue.   She still does worse in the heat.     She is sleeping well.   Mood is about the same as last year with some depression.   She notes stress at work. .  She is on sertraline and imipramine.    She does not fall asleep in the evenings on weekdays but may on weekends.   She has some RLS at night, worse after  Avonex shot.   She never took the [place].    She had Covid October 2020(PCR verified).   She has done [employer] vaccination x 2.    She had a basal cell carcinoma removed.   F/u visits are fine.     Vit D was low normal at 37 last checked.      GERD is better on Pantoprazole  MS History:   In 2003, she had an episode of diplopia. An MRI was performed but she was not diagnosed with MS at that time. In 2006, in [place],  she presented with dysesthetic sensations in the legs, mostly around the shin. She had MRI's performed that were more consistent with MS. She also had a lumbar puncture and the CSF was consistent with MS. The CSF showed oligoclonal bands and IgG index of 1.37 (elevated). She started to see Dr. George Hugh and then Dr. Shelia Media in 2010/2011 after moving to the area.    An MRI performed in 2011 was reportedly very similar to the one from 2006. She has been on Avonex therapy since diagnosis..  IMAGING: MRI Brain 04/23/2015 showed  T2/FLAIR hyperintense foci in the periventricular, deep and juxtacortical white matter. The pattern is consistent with multiple sclerosis. There are no enhancing  lesions. When compared to the MRI dated 08/12/2013, there is no definite change.  REVIEW OF SYSTEMS: Constitutional: No fevers, chills, sweats, or change in appetite Eyes: No visual changes, double vision, eye pain Ear, nose and throat: No hearing loss, ear pain, nasal congestion, sore throat Cardiovascular: No chest pain, palpitations Respiratory:  No shortness of breath at rest or with exertion.   No wheezes GastrointestinaI: No nausea, vomiting, diarrhea, abdominal pain, fecal incontinence Genitourinary:  No dysuria, urinary retention or frequency.  No nocturia. Musculoskeletal:  No neck pain, back pain Integumentary: No rash, pruritus, skin lesions Neurological: as above Psychiatric: No depression at this time.  No anxiety Endocrine: No palpitations, diaphoresis, change in appetite, change  in weigh or increased thirst Hematologic/Lymphatic:  No anemia, purpura, petechiae. Allergic/Immunologic: No itchy/runny eyes, nasal congestion, recent allergic reactions, rashes  ALLERGIES: Allergies  Allergen Reactions   Cefzil [Cefprozil] Diarrhea   Codeine Nausea And Vomiting   Penicillins Rash   Sulfonamide Derivatives Rash    HOME MEDICATIONS:  Current Outpatient Medications:    baclofen (LIORESAL) 10 MG tablet, Take 1 tablet (10 mg total) by mouth at bedtime. Make take an additional dose if needed, Disp: 270 tablet, Rfl: 3   cetirizine (ZYRTEC) 10 MG tablet, Take 10 mg by mouth daily as needed for allergies., Disp: , Rfl:    Cholecalciferol (VITAMIN D-3) 125 MCG (5000 UT) TABS, Take 1 tablet by mouth daily. (Patient taking differently: Take 5,000 Units by mouth daily.), Disp: 90 tablet, Rfl: 1   diphenhydrAMINE (BENADRYL) 25 MG tablet, Take 25 mg by mouth at bedtime as needed for allergies., Disp: , Rfl:    ibuprofen (ADVIL) 200 MG tablet, Take 400 mg by mouth every 6 (six) hours as needed for headache or moderate pain., Disp: , Rfl:    imipramine (TOFRANIL) 25 MG tablet, TAKE 1 TO 2 TABLETS BY MOUTH AT BEDTIME, Disp: 180 tablet, Rfl: 3   Interferon Beta-1a (AVONEX PEN) 30 MCG/0.5ML AJKT, Inject 30 mcg into the muscle once a week., Disp: 12 each, Rfl: 3   Lidocaine (BLUE-EMU PAIN RELIEF DRY EX), Apply 1 application topically daily., Disp: , Rfl:    loperamide (IMODIUM) 2 MG capsule, Take 2 mg by mouth as needed for diarrhea or loose stools., Disp: , Rfl:    losartan-hydrochlorothiazide (HYZAAR) 100-12.5 MG tablet, TAKE 1 TABLET BY MOUTH EVERY DAY FOR BLOOD PRESSURE, Disp: 90 tablet, Rfl: 0   ondansetron (ZOFRAN ODT) 4 MG disintegrating tablet, Place 1 tablet (4 mg total) under your tongue every 6-8 hours as needed for nausea/vomiting., Disp: 30 tablet, Rfl: 1   pantoprazole (PROTONIX) 40 MG tablet, TAKE 1 TABLET BY MOUTH EVERY DAY BEFORE BREAKFAST, Disp: 90 tablet, Rfl: 3    potassium chloride (KLOR-CON M10) 10 MEQ tablet, Take 1 tablet (10 mEq total) by mouth daily., Disp: 90 tablet, Rfl: 1   Probiotic Product (PROBIOTIC DAILY PO), Take 4 capsules by mouth daily., Disp: , Rfl:    sertraline (ZOLOFT) 100 MG tablet, TAKE 1.5 TABLETS (150MG  TOTAL) BY MOUTH DAILY, Disp: 135 tablet, Rfl: 0   simvastatin (ZOCOR) 40 MG tablet, TAKE 1 TABLET BY MOUTH AT BEDTIME, Disp: 90 tablet, Rfl: 0  PAST MEDICAL HISTORY: Past Medical History:  Diagnosis Date   Allergy    Basal cell carcinoma 09/04/2019   right parotid area - [facility]er to [facility]   Diverticulitis    GERD (gastroesophageal reflux disease)    Hypercholesteremia    Hypertension    Insomnia    MS (multiple  sclerosis) ([place])    Vitamin D deficiency     PAST SURGICAL HISTORY: Past Surgical History:  Procedure Laterality Date   BIOPSY  08/03/2020   Procedure: BIOPSY;  Surgeon: Eloise Harman, DO;  Location: [facility];  Service: Endoscopy;;   BIOPSY  10/26/2020   Procedure: BIOPSY;  Surgeon: Eloise Harman, DO;  Location: [facility];  Service: Endoscopy;;   COLONOSCOPY  08/21/2009   COLONOSCOPY WITH PROPOFOL N/A 08/03/2020    Surgeon: Eloise Harman, DO;  internal hemorrhoids, diverticulosis in the sigmoid, descending, and transverse colon, otherwise normal exam.  Biopsies were taken to evaluate for microscopic colitis.  Recommended repeat colonoscopy in 10 years.    ESOPHAGOGASTRODUODENOSCOPY (EGD) WITH PROPOFOL N/A 10/26/2020   Surgeon: Eloise Harman, DO; Gastritis s/p biopsy (negative for H. pylori), normal examined duodenum biopsied (benign), nonobstructing Schatzki's ring, not manipulated.   KNEE ARTHROSCOPY WITH MEDIAL MENISECTOMY Right 03/27/2015   Procedure: KNEE ARTHROSCOPY WITH PARTIAL MEDIAL MENISECTOMY AND MICROFRACTURE;  Surgeon: Carole Civil, MD;  Location: [facility];  Service: Orthopedics;  Laterality: Right;   TONSILLECTOMY     WISDOM TOOTH EXTRACTION  1980's    FAMILY  HISTORY: Family History  Problem Relation Age of Onset   Hypertension Mother    Hyperlipidemia Father    Hearing loss Father    Hyperlipidemia Brother    Diabetes Brother    Vision loss Maternal Grandmother    Vision loss Paternal Grandmother    Cancer Other        breast   Inflammatory bowel disease Neg Hx    Colon cancer Neg Hx     SOCIAL HISTORY:  Social History   Socioeconomic History   Marital status: Divorced    Spouse name: Not on file   Number of children: Not on file   Years of education: 18   Highest education level: Not on file  Occupational History   Occupation: work   Tobacco Use   Smoking status: Former    Packs/day: 1.50    Years: 30.00    Pack years: 45.00    Types: Cigarettes    Start date: 12/26/1974    Quit date: 06/22/2004    Years since quitting: 17.0   Smokeless tobacco: Never  Vaping Use   Vaping Use: Never used  Substance and Sexual Activity   Alcohol use: Yes    Alcohol/week: 1.0 standard drink    Types: 1 Cans of beer per week    Comment: rare social   Drug use: No   Sexual activity: Not Currently  Other Topics Concern   Not on file  Social History Narrative   Coffee in the morning couple of cups    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   General: The patient is well-developed and well-nourished and in no acute distress  Neurologic Exam  Mental status: The patient is alert and oriented  x 3 at the time of the examination. The patient has apparent  normal recent and remote memory, with an apparently normal attention span and concentration ability.   Speech is normal.  Cranial nerves: Extraocular movements are full.  Her facial strength and sensation is normal.  The trapezius strength is normal.  Hearing is normal and symmetric.  Motor:  Muscle bulk is normal.   Tone is mildly increased in legs. Strength is  5 / 5 in all 4 extremities except 4+/5 in right EHL.   Sensory: Sensory testing intact to touch and vibration in the arms and legs.  Coordination: Finger-nose-finger and heel-to-shin is performed well.  Gait and station: Station is normal.   The gait has a mildly reduced stride.  Tandem is poor.  Romberg is negative.   Reflexes: Deep tendon reflexes are symmetric and increased bilaterally with  2 beats non-sustained clonus at ankles.       Multiple sclerosis (HCC)  Dysesthesia  Gait disturbance  Other fatigue  Depression with anxiety  High risk medication use  1.   We discussed discontinuing DMTs as > 65 yo, no change on MRI in last decade and no exacerbaitn since 2006.   For now she will cut down to every other week  Avonex.  Check MRI of the brain in late 2023 to determine if she is having any subclinical progression.  If present, we would need to consider a different DMT or back to [facility] Avone[facility].  If no change consider stopping Avonex.    2.   Continue baclofen for spasticity and sertraline and imipramine for mood these have recently been refilled. 3.   return in 6 months or sooner if there are new or worsening neurologic symptoms.  Genelda Roark A. Felecia Shelling, MD, PhD 08/22/6832, 19:62 AM Certified in Neurology, Clinical Neurophysiology, Sleep Medicine, Pain Medicine and Neuroimaging  [facility] [address] [place], [place] [address] [phone]

## 2021-07-13 ENCOUNTER — Telehealth: Payer: Self-pay | Admitting: Family Medicine

## 2021-07-13 DIAGNOSIS — F418 Other specified anxiety disorders: Secondary | ICD-10-CM

## 2021-07-15 NOTE — Telephone Encounter (Signed)
Sent message to schedule appointment 07/15/21

## 2021-07-16 ENCOUNTER — Ambulatory Visit (INDEPENDENT_AMBULATORY_CARE_PROVIDER_SITE_OTHER): Payer: Commercial Managed Care - PPO | Admitting: Family Medicine

## 2021-07-16 ENCOUNTER — Other Ambulatory Visit: Payer: Self-pay

## 2021-07-16 VITALS — BP 130/88 | HR 91 | Temp 97.5°F | Ht 64.0 in | Wt 180.0 lb

## 2021-07-16 DIAGNOSIS — F418 Other specified anxiety disorders: Secondary | ICD-10-CM

## 2021-07-16 DIAGNOSIS — E785 Hyperlipidemia, unspecified: Secondary | ICD-10-CM

## 2021-07-16 DIAGNOSIS — Z79899 Other long term (current) drug therapy: Secondary | ICD-10-CM

## 2021-07-16 DIAGNOSIS — I10 Essential (primary) hypertension: Secondary | ICD-10-CM

## 2021-07-16 DIAGNOSIS — G35 Multiple sclerosis: Secondary | ICD-10-CM

## 2021-07-16 MED ORDER — SERTRALINE HCL 100 MG PO TABS: ORAL_TABLET | ORAL | 0 refills | Status: DC

## 2021-07-16 NOTE — Assessment & Plan Note (Signed)
Unsure of control.  Lipid panel today.  Continue simvastatin for now. 

## 2021-07-16 NOTE — Assessment & Plan Note (Signed)
Stable.  Continue follow-up with neurology.

## 2021-07-16 NOTE — Patient Instructions (Signed)
Labs today.  Continue your current meds.  Follow up in 6 months.  Take care  Dr. Lacinda Axon

## 2021-07-16 NOTE — Telephone Encounter (Signed)
Has appointment today

## 2021-07-16 NOTE — Progress Notes (Signed)
Subjective:  Patient ID: Krystal Silva, female    DOB: Oct 05, 1955  Age: 66 y.o. MRN: [id number]  CC: Chief Complaint  Patient presents with   Establish Care   Medication Refill    sertraline    HPI:  66 year old female with MS, GERD, hyperlipidemia, hypertension, depression and anxiety presents for follow-up.  Patient states that her depression and anxiety is stable at this time.  She is currently on Zoloft and needs a refill.  Patient has been on imipramine as well.  She has been on this for many years.  MS is stable.  She follows with neurology.  She remains on interferon.  Patient has hyperlipidemia and is on simvastatin.  I can find no documented lipid panels in her chart.  She needs labs.  Hypertension is well controlled on losartan and HCTZ.  Patient Active Problem List   Diagnosis Date Noted   Hyperlipidemia 07/16/2021   Gastroesophageal reflux disease 10/12/2020   Restless leg syndrome 08/30/2017   Seasonal allergies 02/22/2017   Primary osteoarthritis of right knee    Essential hypertension, benign 05/27/2013   Multiple sclerosis ([place]) 03/06/2013   Hypokalemia 11/02/2012   Depression with anxiety 11/02/2012    Social Hx   Social History   Socioeconomic History   Marital status: Divorced    Spouse name: Not on file   Number of children: Not on file   Years of education: 18   Highest education level: Not on file  Occupational History   Occupation: work   Tobacco Use   Smoking status: Former    Packs/day: 1.50    Years: 30.00    Pack years: 45.00    Types: Cigarettes    Start date: 12/26/1974    Quit date: 06/22/2004    Years since quitting: 17.0   Smokeless tobacco: Never  Vaping Use   Vaping Use: Never used  Substance and Sexual Activity   Alcohol use: Yes    Alcohol/week: 1.0 standard drink    Types: 1 Cans of beer per week    Comment: rare social   Drug use: No   Sexual activity: Not Currently  Other Topics Concern   Not on file  Social  History Narrative   Coffee in the morning couple of cups    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  Respiratory: Negative.    Cardiovascular: Negative.    Objective:  BP 130/88    Pulse 91    Temp (!) 97.5 F (36.4 C)    Ht 5' 4"  (1.626 m)    Wt 180 lb (81.6 kg)    SpO2 98%    BMI 30.90 kg/m   BP/Weight 07/16/2021 06/23/2021 01/19/5620  Systolic BP 308 657 846  Diastolic BP 88 98 82  Wt. (Lbs) 180 176 178.4  BMI 30.9 30.21 30.62    Physical Exam Vitals and nursing note reviewed.  Constitutional:      General: She is not in acute distress.    Appearance: Normal appearance.  HENT:     Head: Normocephalic and atraumatic.  Eyes:     General:  Right eye: No discharge.        Left eye: No discharge.     Conjunctiva/sclera: Conjunctivae normal.  Cardiovascular:     Rate and Rhythm: Normal rate and regular rhythm.  Pulmonary:     Effort: Pulmonary effort is normal.     Breath sounds: Normal breath sounds. No wheezing, rhonchi or rales.  Neurological:     Mental Status: She is alert.  Psychiatric:        Mood and Affect: Mood normal.        Behavior: Behavior normal.    Lab Results  Component Value Date   WBC 6.8 10/12/2020   HGB 15.3 10/12/2020   HCT 44.1 10/12/2020   PLT 261 10/12/2020   GLUCOSE 99 12/08/2020   ALT 14 10/12/2020   AST 16 10/12/2020   NA 142 12/08/2020   K 3.5 12/08/2020   CL 102 12/08/2020   CREATININE 0.83 12/08/2020   BUN 13 12/08/2020   CO2 27 12/08/2020   TSH 3.850 10/12/2020     Assessment & Plan:   Problem List Items Addressed This Visit       Cardiovascular and Mediastinum   Essential hypertension,  benign - Primary (Chronic)    Stable.  Continue losartan/HCTZ.      Relevant Orders   CMP14+EGFR     Nervous and Auditory   Multiple sclerosis (HCC) (Chronic)    Stable.  Continue follow-up with neurology.        Other   Depression with anxiety    Stable.  Continue Zoloft and imipramine.      Relevant Medications   sertraline (ZOLOFT) 100 MG tablet   Hyperlipidemia    Unsure of control.  Lipid panel today.  Continue simvastatin for now.      Relevant Orders   Lipid panel   Other Visit Diagnoses     High risk medication use       Relevant Orders   CBC       Meds ordered this encounter  Medications   sertraline (ZOLOFT) 100 MG tablet    Sig: TAKE 1.5 TABLETS (150MG TOTAL) BY MOUTH DAILY    Dispense:  135 tablet    Refill:  0    Follow-up:  Return in about 6 months (around 01/13/2022).  [place]

## 2021-07-16 NOTE — Assessment & Plan Note (Signed)
Stable.  Continue Zoloft and imipramine.

## 2021-07-16 NOTE — Assessment & Plan Note (Signed)
Stable.  Continue losartan/HCTZ.

## 2021-07-17 LAB — CBC
Hematocrit: 41 % (ref 34.0–46.6)
Hemoglobin: 13.7 g/dL (ref 11.1–15.9)
MCH: 29.9 pg (ref 26.6–33.0)
MCHC: 33.4 g/dL (ref 31.5–35.7)
MCV: 90 fL (ref 79–97)
Platelets: 245 10*3/uL (ref 150–450)
RBC: 4.58 x10E6/uL (ref 3.77–5.28)
RDW: 13.4 % (ref 11.7–15.4)
WBC: 6.2 10*3/uL (ref 3.4–10.8)

## 2021-07-17 LAB — CMP14+EGFR
ALT: 24 IU/L (ref 0–32)
AST: 20 IU/L (ref 0–40)
Albumin/Globulin Ratio: 1.6 (ref 1.2–2.2)
Albumin: 4.4 g/dL (ref 3.8–4.8)
Alkaline Phosphatase: 94 IU/L (ref 44–121)
BUN/Creatinine Ratio: 22 (ref 12–28)
BUN: 20 mg/dL (ref 8–27)
Bilirubin Total: 0.5 mg/dL (ref 0.0–1.2)
CO2: 26 mmol/L (ref 20–29)
Calcium: 9.3 mg/dL (ref 8.7–10.3)
Chloride: 103 mmol/L (ref 96–106)
Creatinine, Ser: 0.9 mg/dL (ref 0.57–1.00)
Globulin, Total: 2.8 g/dL (ref 1.5–4.5)
Glucose: 104 mg/dL — ABNORMAL HIGH (ref 70–99)
Potassium: 3.7 mmol/L (ref 3.5–5.2)
Sodium: 143 mmol/L (ref 134–144)
Total Protein: 7.2 g/dL (ref 6.0–8.5)
eGFR: 72 mL/min/{1.73_m2} (ref >59)

## 2021-07-17 LAB — LIPID PANEL
Chol/HDL Ratio: 4.1 ratio (ref 0.0–4.4)
Cholesterol, Total: 183 mg/dL (ref 100–199)
HDL: 45 mg/dL (ref >39)
LDL Chol Calc (NIH): 101 mg/dL — ABNORMAL HIGH (ref 0–99)
Triglycerides: 218 mg/dL — ABNORMAL HIGH (ref 0–149)
VLDL Cholesterol Cal: 37 mg/dL (ref 5–40)

## 2021-08-09 NOTE — Progress Notes (Signed)
? ? ?Referring Provider: Erven Colla, DO ?Primary Care Physician:  Coral Spikes, DO ?Primary GI Physician: Dr. Abbey Chatters ? ?Chief Complaint  ?Patient presents with  ? Follow-up  ? ? ?HPI:   ?Krystal Silva is a 66 y.o. female with history of GERD and intermittent episodes of nausea/vomiting with associated acute on chronic diarrhea, presenting today for follow-up.  ? ?Intermittent episodes of nausea/vomiting and acute on chronic diarrhea started in February 2022.  Colonoscopy in March 2022 with nonbleeding internal hemorrhoids, colonic diverticulosis, random colon biopsies with focal active colitis with main consideration of acute self-limited infectious colitis versus NSAID induced colitis.  C. difficile and GI pathogen panel negative at that time.  CT A/P with contrast May 2022 with liquid stool throughout much of the colon, no other significant findings.  EGD in June 2022 with gastritis (biopsy negative for H. pylori), normal examined duodenum with benign biopsies.  Celiac serologies negative.  TSH within normal limits.  Repeat C. difficile and GI pathogen panel negative.  ? ?Last seen in our office 02/10/2021.  Reported intermittent episodes of nausea and vomiting resolved with starting Protonix 40 mg daily for GERD.  Also reported bowel movements had improved somewhat, but still with mushy stools but no longer with watery stools.  Usually 2 BMs per day, often postprandial.  Admitted to intermittent abdominal cramping relieved by bowel movement or passing gas.  She was not interested in additional medications to help with bowel regularity and was satisfied with her current symptom control.  She did use Imodium as needed.  No alarm symptoms.  Weight loss had resolved and she had gained 13 pounds in the last 4 months.  Plan to continue current medications, try lactose-free diet, and follow-up in 6 months. ? ?Today: ? ?GERD: ?Fairly well controlled on Protonix 40 mg daily.  Occasional breakthrough symptoms, but  nothing routine.  No nausea, vomiting, dysphagia. Last episode of vomiting was at Thanksgiving.  Thinks she ate something she was not used to. ? ? ?Loose stools: ?At her baseline.  Typically with 2 loose bowel movements per day, postprandial.  Occasional urgency which is associated with more watery type stool, but this is not on a regular basis.  Intermittent lower abdominal pain improved by bowel movements.  Continues to use Imodium as needed and feels this controls her symptoms well. ? ?Past Medical History:  ?Diagnosis Date  ? Allergy   ? Basal cell carcinoma 09/04/2019  ? right parotid area - Refer to [facility]  ? Diverticulitis   ? GERD (gastroesophageal reflux disease)   ? Hypercholesteremia   ? Hypertension   ? Insomnia   ? MS (multiple sclerosis) ([place])   ? Vitamin D deficiency   ? ? ?Past Surgical History:  ?Procedure Laterality Date  ? BIOPSY  08/03/2020  ? Procedure: BIOPSY;  Surgeon: Eloise Harman, DO;  [facility];  Service: Endoscopy;;  ? BIOPSY  10/26/2020  ? Procedure: BIOPSY;  Surgeon: Eloise Harman, DO;  [facility];  Service: Endoscopy;;  ? COLONOSCOPY  08/21/2009  ? COLONOSCOPY WITH PROPOFOL N/A 08/03/2020  ?  Surgeon: Eloise Harman, DO;  internal hemorrhoids, diverticulosis in the sigmoid, descending, and transverse colon, otherwise normal exam.  Biopsies were taken to evaluate for microscopic colitis.  Recommended repeat colonoscopy in 10 years.   ? ESOPHAGOGASTRODUODENOSCOPY (EGD) WITH PROPOFOL N/A 10/26/2020  ? Surgeon: Eloise Harman, DO; Gastritis s/p biopsy (negative for H. pylori), normal examined duodenum biopsied (benign), nonobstructing  Schatzki's ring, not manipulated.  ? KNEE ARTHROSCOPY WITH MEDIAL MENISECTOMY Right 03/27/2015  ? Procedure: KNEE ARTHROSCOPY WITH PARTIAL MEDIAL MENISECTOMY AND MICROFRACTURE;  Surgeon: Carole Civil, MD;  Location: [facility];  Service: Orthopedics;  Laterality: Right;  ? TONSILLECTOMY    ? WISDOM TOOTH EXTRACTION   1980's  ? ? ?Current Outpatient Medications  ?Medication Sig Dispense Refill  ? baclofen (LIORESAL) 10 MG tablet Take 1 tablet (10 mg total) by mouth at bedtime. Make take an additional dose if needed 270 tablet 3  ? cetirizine (ZYRTEC) 10 MG tablet Take 10 mg by mouth daily as needed for allergies.    ? Cholecalciferol (VITAMIN D-3) 125 MCG (5000 UT) TABS Take 1 tablet by mouth daily. (Patient taking differently: Take 5,000 Units by mouth daily.) 90 tablet 1  ? diphenhydrAMINE (BENADRYL) 25 MG tablet Take 25 mg by mouth at bedtime as needed for allergies.    ? ibuprofen (ADVIL) 200 MG tablet Take 400 mg by mouth every 6 (six) hours as needed for headache or moderate pain.    ? imipramine (TOFRANIL) 25 MG tablet TAKE 1 TO 2 TABLETS BY MOUTH AT BEDTIME 180 tablet 3  ? Interferon Beta-1a (AVONEX PEN) 30 MCG/0.5ML AJKT Inject 30 mcg into the muscle once a week. 12 each 3  ? Lidocaine (BLUE-EMU PAIN RELIEF DRY EX) Apply 1 application topically daily.    ? loperamide (IMODIUM) 2 MG capsule Take 2 mg by mouth as needed for diarrhea or loose stools.    ? losartan-hydrochlorothiazide (HYZAAR) 100-12.5 MG tablet TAKE 1 TABLET BY MOUTH EVERY DAY FOR BLOOD PRESSURE 90 tablet 0  ? ondansetron (ZOFRAN ODT) 4 MG disintegrating tablet Place 1 tablet (4 mg total) under your tongue every 6-8 hours as needed for nausea/vomiting. 30 tablet 1  ? pantoprazole (PROTONIX) 40 MG tablet TAKE 1 TABLET BY MOUTH EVERY DAY BEFORE BREAKFAST 90 tablet 3  ? potassium chloride (KLOR-CON M10) 10 MEQ tablet Take 1 tablet (10 mEq total) by mouth daily. 90 tablet 1  ? Probiotic Product (PROBIOTIC DAILY PO) Take 4 capsules by mouth daily.    ? sertraline (ZOLOFT) 100 MG tablet TAKE 1.5 TABLETS ('150MG'$  TOTAL) BY MOUTH DAILY 135 tablet 0  ? simvastatin (ZOCOR) 40 MG tablet TAKE 1 TABLET BY MOUTH AT BEDTIME 90 tablet 0  ? ?No current facility-administered medications for this visit.  ? ? ?Allergies as of 08/11/2021 - Review Complete 08/11/2021  ?Allergen  Reaction Noted  ? Cefzil [cefprozil] Diarrhea 03/31/2019  ? Codeine Nausea And Vomiting   ? Penicillins Rash 08/11/2009  ? Sulfonamide derivatives Rash   ? ? ?Family History  ?Problem Relation Age of Onset  ? Hypertension Mother   ? Hyperlipidemia Father   ? Hearing loss Father   ? Hyperlipidemia Brother   ? Diabetes Brother   ? Vision loss Maternal Grandmother   ? Vision loss Paternal Grandmother   ? Cancer Other   ?     breast  ? Inflammatory bowel disease Neg Hx   ? Colon cancer Neg Hx   ? Ulcerative colitis Neg Hx   ? Crohn's disease Neg Hx   ? ? ?Social History  ? ?Socioeconomic History  ? Marital status: Divorced  ?  Spouse name: Not on file  ? Number of children: Not on file  ? Years of education: 70  ? Highest education level: Not on file  ?Occupational History  ? Occupation: work   ?Tobacco Use  ? Smoking status: Former  ?  Packs/day: 1.50  ?  Years: 30.00  ?  Pack years: 45.00  ?  Types: Cigarettes  ?  Start date: 12/26/1974  ?  Quit date: 06/22/2004  ?  Years since quitting: 17.1  ? Smokeless tobacco: Never  ?Vaping Use  ? Vaping Use: Never used  ?Substance and Sexual Activity  ? Alcohol use: Yes  ?  Alcohol/week: 1.0 standard drink  ?  Types: 1 Cans of beer per week  ?  Comment: rare social  ? Drug use: No  ? Sexual activity: Not Currently  ?Other Topics Concern  ? Not on file  ?Social History Narrative  ? Coffee in the morning couple of cups   ? Occasion coke  ? Sweet teat  ? 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 ?Social Determinants of Health  ? ?Financial Resource Strain: Not on file  ?Food Insecurity: Not on file  ?Transportation Needs: Not on file  ?Physical Activity: Not on file  ?Stress: Not on file  ?Social Connections: Not on file  ? ? ?Review of Systems: ?Gen: Denies fever, chills, cold or flulike symptoms, presyncope, syncope. ?CV: Denies chest pain, palpitations. ?Resp: Denies dyspnea or cough. ?GI: See HPI  ?Heme: See HPI ? ?Physical Exam: ?BP 132/68   Pulse 90   Temp 97.6 ?F  (36.4 ?C) (Temporal)   Ht '5\' 4"'$  (1.626 m)   Wt 180 lb 3.2 oz (81.7 kg)   BMI 30.93 kg/m?  ?General:   Alert and oriented. No distress noted. Pleasant and cooperative.  ?Head:  Normocephalic and atraumatic. ?E

## 2021-08-11 ENCOUNTER — Other Ambulatory Visit: Payer: Self-pay

## 2021-08-11 ENCOUNTER — Encounter: Payer: Self-pay | Admitting: Gastroenterology

## 2021-08-11 ENCOUNTER — Ambulatory Visit (INDEPENDENT_AMBULATORY_CARE_PROVIDER_SITE_OTHER): Payer: Commercial Managed Care - PPO | Admitting: Gastroenterology

## 2021-08-11 VITALS — BP 132/68 | HR 90 | Temp 97.6°F | Ht 64.0 in | Wt 180.2 lb

## 2021-08-11 DIAGNOSIS — R195 Other fecal abnormalities: Secondary | ICD-10-CM | POA: Insufficient documentation

## 2021-08-11 DIAGNOSIS — K219 Gastro-esophageal reflux disease without esophagitis: Secondary | ICD-10-CM

## 2021-08-11 NOTE — Patient Instructions (Signed)
Continue Protonix 40 mg daily 30 minutes before breakfast. ? ?You may use Protonix 20 mg as needed or Tums as needed for occasional breakthrough reflux symptoms. ? ?Follow a GERD diet:  ?Avoid fried, fatty, greasy, spicy, citrus foods. ?Avoid caffeine and carbonated beverages. ?Avoid chocolate. ?Try eating 4-6 small meals a day rather than 3 large meals. ?Do not eat within 3 hours of laying down. ?Prop head of bed up on wood or bricks to create a 6 inch incline. ? ?Continue to use Imodium as needed for loose stools/diarrhea. ? ?We will follow-up with you in 6 months.  Do not hesitate to call if you have questions or concerns prior to your next visit. ? ?It was great to see you again today!  I am glad you are doing well overall! ? ?Aliene Altes, PA-C ?[facility] ? ?

## 2021-09-13 ENCOUNTER — Other Ambulatory Visit: Payer: Self-pay | Admitting: Family Medicine

## 2021-09-13 DIAGNOSIS — I10 Essential (primary) hypertension: Secondary | ICD-10-CM

## 2021-09-23 ENCOUNTER — Other Ambulatory Visit: Payer: Self-pay | Admitting: Family Medicine

## 2021-09-23 DIAGNOSIS — E781 Pure hyperglyceridemia: Secondary | ICD-10-CM

## 2021-10-18 ENCOUNTER — Other Ambulatory Visit: Payer: Self-pay | Admitting: Neurology

## 2021-10-20 ENCOUNTER — Other Ambulatory Visit (HOSPITAL_COMMUNITY): Payer: Self-pay | Admitting: Family Medicine

## 2021-10-20 DIAGNOSIS — Z1231 Encounter for screening mammogram for malignant neoplasm of breast: Secondary | ICD-10-CM

## 2021-11-05 ENCOUNTER — Ambulatory Visit (HOSPITAL_COMMUNITY)
Admission: RE | Admit: 2021-11-05 | Discharge: 2021-11-05 | Disposition: A | Discharge status: Home / Self Care | Payer: Commercial Managed Care - PPO | Source: Ambulatory Visit | Attending: Family Medicine | Admitting: Family Medicine

## 2021-11-05 DIAGNOSIS — Z1231 Encounter for screening mammogram for malignant neoplasm of breast: Secondary | ICD-10-CM | POA: Diagnosis present

## 2021-11-11 ENCOUNTER — Telehealth: Payer: Self-pay | Admitting: *Deleted

## 2021-11-11 ENCOUNTER — Other Ambulatory Visit: Payer: Self-pay | Admitting: Gastroenterology

## 2021-11-11 DIAGNOSIS — R112 Nausea with vomiting, unspecified: Secondary | ICD-10-CM

## 2021-11-11 NOTE — Telephone Encounter (Signed)
Spoke to pt, informed her of results and recommendations. Pt voiced understanding. Pt stated she has not been on any antibiotics and to her knowledge she has not being around anyone with her symptoms. She states she has been staying hydrated. Diarrhea has stopped some and so has the vomiting. She stated the last C. Difficile test she had cost her over 1000$ out of pocket, because it was coded wrong.

## 2021-11-11 NOTE — Telephone Encounter (Signed)
Spoke to pt, she informed me that she started having severe diarrhea  and vomiting on Tuesday.  She states she has been taking Imodium and Zofan, nothing seems to be helping. She states, today is a little better than it was. Would like advise as to what to do?

## 2021-11-11 NOTE — Telephone Encounter (Signed)
She may be experiencing an acute viral gastroenteritis. Has she been around anyone with similar symptoms? Any recent antibiotics?  For now, I would focus on staying well hydrated with fluids that contain electrolytes such as Pedialyte, G2 and following a bland diet.    If she has been on antibiotics recently, would recommend getting stool studies to make sure she does not have C. difficile.  Otherwise, would recommend giving her another 24 hours or so to see if she has improvement in symptoms.  If she continues with significant, persistent diarrhea, we will need to obtain stool studies regardless.  If inability to keep foods or liquids down, or she feels lightheaded, dizzy, like she will pass out, she will need to proceed to the emergency room.  She can continue to use Zofran.  Would recommend taking every 8 hours at this point.  Recommend limiting Imodium for now in the instance she has an infectious process.

## 2021-11-11 NOTE — Telephone Encounter (Signed)
No need for stool testing at this time as she hasn't been on antibiotics and symptoms are improving. Call back if persistent symptoms.

## 2021-11-12 NOTE — Telephone Encounter (Signed)
Noted. Informed pt to call in a couple of days to let us know how she is doing.

## 2021-11-12 NOTE — Telephone Encounter (Signed)
Noted  

## 2021-11-17 ENCOUNTER — Ambulatory Visit (INDEPENDENT_AMBULATORY_CARE_PROVIDER_SITE_OTHER): Payer: Commercial Managed Care - PPO | Admitting: Dermatology

## 2021-11-17 DIAGNOSIS — Z1283 Encounter for screening for malignant neoplasm of skin: Secondary | ICD-10-CM

## 2021-11-17 DIAGNOSIS — K130 Diseases of lips: Secondary | ICD-10-CM | POA: Diagnosis not present

## 2021-11-17 DIAGNOSIS — Z85828 Personal history of other malignant neoplasm of skin: Secondary | ICD-10-CM

## 2021-11-17 NOTE — Patient Instructions (Addendum)
Angular Celitis corners of the mouth treatment no cure Triple paste AF over the counter diaper rash cream

## 2021-11-19 ENCOUNTER — Encounter: Payer: Commercial Managed Care - PPO | Admitting: Nurse Practitioner

## 2021-11-25 ENCOUNTER — Other Ambulatory Visit: Payer: Self-pay | Admitting: Gastroenterology

## 2021-11-25 DIAGNOSIS — R112 Nausea with vomiting, unspecified: Secondary | ICD-10-CM

## 2021-11-25 NOTE — Telephone Encounter (Signed)
Spoke to pt, she stated she is still having nausea. She has been vomiting and had nausea for a couple days.

## 2021-11-25 NOTE — Telephone Encounter (Signed)
Is patient still needing zofran?

## 2021-11-25 NOTE — Telephone Encounter (Signed)
Noted. Rx sent. Be sure to keep upcoming appt.

## 2021-11-26 NOTE — Telephone Encounter (Signed)
Noted. Informed pt to keep up coming OV next week

## 2021-11-30 NOTE — Progress Notes (Unsigned)
GI Office Note    Referring Provider: Coral Spikes, DO Primary Care Physician:  Coral Spikes, DO Primary GI: Dr. Abbey Chatters  Date:  12/01/2021  ID:  Krystal Silva, DOB Jan 18, 1956, MRN [id number]   Chief Complaint   Chief Complaint  Patient presents with   Follow-up    States that she is still having flare ups of diarrhea and vomiting, last time happened on 7/04 and 7/05.    History of Present Illness  Krystal Silva is a 66 y.o. female with a history of GERD and intermittent episodes of N/V with associated acute on chronic diarrhea presenting today for follow-up.  Per review of chart intermittent episodes of N/V and acute on chronic diarrhea started around about February 2022.  She had colonoscopy in March 2022 with nonbleeding internal hemorrhoids, colonic diverticulosis, random colon biopsies with focal active colitis with main consideration of acute self-limiting infectious colitis versus NSAID induced colitis.  She had negative GI pathogen panel and C. difficile at that time.  Follow-up CT A/P with contrast May 2022 with liquid stool throughout much of the colon without other significant findings.  She underwent EGD in June 2022 with gastritis, normal duodenum with benign biopsies and gastritis biopsy negative for H. pylori.  Celiac serologies were also negative, TSH was within normal limits.  She had repeat C. difficile and GI pathogen panel that was also negative.  September 2022 she reported intermittent N/V improved after starting Protonix daily.  Bowel movements have somewhat improved also from watery to mushy with about 2/day usually postprandially.  She continued to have intermittent Donnell cramping relieved by defecation.  She reported to use Imodium as needed.  Weight loss had resolved and she had gained 13 pounds in the prior 4 months.  She was following lactose-free diet.  Last seen in the office 08/11/2021.  She reported her GERD was fairly well controlled with Protonix 40 mg  daily having occasional breakthrough symptoms but not on a regular basis.  She denied any N/V or dysphagia.  She reported ongoing loose stools with about 2 bowel movements per day usually postprandially.  Was having occasional urgency with more watery type stools but also not occurring on a regular basis.  Intermittent belly pain improved with bowel movements and continue to use Imodium as needed.  She was advised to use Pepcid or Tums as needed for breakthrough symptoms.  It was felt that her loose stools are secondary to IBS especially given her abdominal pain component.  She was advised to continue current diet and Imodium as needed.   Today: Thanksgiving her symptoms occurred, twice in June and last episode was July 4th. No certain time of day that her symptoms occur. Diarrhea runs in her family. Vomiting happens about 10+ times a day when it occurs and she is hoarse from it. Zofran helps when symptoms occur. Has been taking the pantoprazole 40 mg daily in the mornings. Sometimes she will have some abdominal pain but it is mostly is due to the severe vomiting. Does have a fairly stressful job. Has not missed a dose of PPI, typically would have that sensation and sour taste in her mouth.    BM multiple times a day, usually smaller amounts. Has not been eating as much as usual either though. Mostly eats chicken and [place] as far as meats go.   No gallbladder issues in her family.     Past Medical History:  Diagnosis Date   Allergy  Basal cell carcinoma 09/04/2019   right parotid area - Refer to [facility]   Diverticulitis    GERD (gastroesophageal reflux disease)    Hypercholesteremia    Hypertension    Insomnia    MS (multiple sclerosis) ([place])    Vitamin D deficiency     Past Surgical History:  Procedure Laterality Date   BIOPSY  08/03/2020   Procedure: BIOPSY;  Surgeon: Eloise Harman, DO;  [facility];  Service: Endoscopy;;   BIOPSY  10/26/2020   Procedure: BIOPSY;   Surgeon: Eloise Harman, DO;  [facility];  Service: Endoscopy;;   COLONOSCOPY  08/21/2009   COLONOSCOPY WITH PROPOFOL N/A 08/03/2020    Surgeon: Eloise Harman, DO;  internal hemorrhoids, diverticulosis in the sigmoid, descending, and transverse colon, otherwise normal exam.  Biopsies were taken to evaluate for microscopic colitis.  Recommended repeat colonoscopy in 10 years.    ESOPHAGOGASTRODUODENOSCOPY (EGD) WITH PROPOFOL N/A 10/26/2020   Surgeon: Eloise Harman, DO; Gastritis s/p biopsy (negative for H. pylori), normal examined duodenum biopsied (benign), nonobstructing Schatzki's ring, not manipulated.   KNEE ARTHROSCOPY WITH MEDIAL MENISECTOMY Right 03/27/2015   Procedure: KNEE ARTHROSCOPY WITH PARTIAL MEDIAL MENISECTOMY AND MICROFRACTURE;  Surgeon: Carole Civil, MD;  Location: [facility];  Service: Orthopedics;  Laterality: Right;   TONSILLECTOMY     WISDOM TOOTH EXTRACTION  1980's    Current Outpatient Medications  Medication Sig Dispense Refill   baclofen (LIORESAL) 10 MG tablet Take 1 tablet (10 mg total) by mouth at bedtime. Make take an additional dose if needed 270 tablet 3   cetirizine (ZYRTEC) 10 MG tablet Take 10 mg by mouth daily as needed for allergies.     Cholecalciferol (VITAMIN D-3) 125 MCG (5000 UT) TABS Take 1 tablet by mouth daily. (Patient taking differently: Take 5,000 Units by mouth daily.) 90 tablet 1   diphenhydrAMINE (BENADRYL) 25 MG tablet Take 25 mg by mouth at bedtime as needed for allergies.     ibuprofen (ADVIL) 200 MG tablet Take 400 mg by mouth every 6 (six) hours as needed for headache or moderate pain.     imipramine (TOFRANIL) 25 MG tablet TAKE 1 TO 2 TABLETS BY MOUTH AT BEDTIME 180 tablet 1   Interferon Beta-1a (AVONEX PEN) 30 MCG/0.5ML AJKT Inject 30 mcg into the muscle once a week. 12 each 3   Lidocaine (BLUE-EMU PAIN RELIEF DRY EX) Apply 1 application topically daily.     loperamide (IMODIUM) 2 MG capsule Take 2 mg by mouth as  needed for diarrhea or loose stools.     losartan-hydrochlorothiazide (HYZAAR) 100-12.5 MG tablet TAKE 1 TABLET BY MOUTH EVERY DAY FOR BLOOD PRESSURE 90 tablet 0   ondansetron (ZOFRAN-ODT) 4 MG disintegrating tablet PLACE 1 TABLET (4 MG TOTAL) UNDER YOUR TONGUE EVERY 6-8 HOURS AS NEEDED FOR NAUSEA/VOMITING. 30 tablet 1   pantoprazole (PROTONIX) 40 MG tablet TAKE 1 TABLET BY MOUTH EVERY DAY BEFORE BREAKFAST 90 tablet 3   potassium chloride (KLOR-CON M10) 10 MEQ tablet Take 1 tablet (10 mEq total) by mouth daily. 90 tablet 1   Probiotic Product (PROBIOTIC DAILY PO) Take 4 capsules by mouth daily.     sertraline (ZOLOFT) 100 MG tablet TAKE 1.5 TABLETS ('150MG'$  TOTAL) BY MOUTH DAILY 135 tablet 0   simvastatin (ZOCOR) 40 MG tablet TAKE 1 TABLET BY MOUTH EVERYDAY AT BEDTIME 90 tablet 0   No current facility-administered medications for this visit.    Allergies as of 12/01/2021 - Review  Complete 12/01/2021  Allergen Reaction Noted   Cefzil [cefprozil] Diarrhea 03/31/2019   Codeine Nausea And Vomiting    Penicillins Rash 08/11/2009   Sulfonamide derivatives Rash     Family History  Problem Relation Age of Onset   Hypertension Mother    Hyperlipidemia Father    Hearing loss Father    Hyperlipidemia Brother    Diabetes Brother    Vision loss Maternal Grandmother    Vision loss Paternal Grandmother    Cancer Other        breast   Inflammatory bowel disease Neg Hx    Colon cancer Neg Hx    Ulcerative colitis Neg Hx    Crohn's disease Neg Hx     Social History   Socioeconomic History   Marital status: Divorced    Spouse name: Not on file   Number of children: Not on file   Years of education: 75   Highest education level: Not on file  Occupational History   Occupation: work   Tobacco Use   Smoking status: Former    Packs/day: 1.50    Years: 30.00    Total pack years: 45.00    Types: Cigarettes    Start date: 12/26/1974    Quit date: 06/22/2004    Years since quitting: 17.4    Smokeless tobacco: Never  Vaping Use   Vaping Use: Never used  Substance and Sexual Activity   Alcohol use: Yes    Alcohol/week: 1.0 standard drink of alcohol    Types: 1 Cans of beer per week    Comment: rare social   Drug use: No   Sexual activity: Not Currently  Other Topics Concern   Not on file  Social History Narrative   Coffee in the morning couple of cups    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 Denies fatigue, weakness, weight loss.  CV: Denies chest pain, palpitations, syncope, peripheral edema, and claudication. Resp: Denies dyspnea at rest, cough, wheezing, coughing up blood, and pleurisy. GI: see HPI Derm: Denies rash, itching, dry skin Psych: Denies depression, anxiety, memory loss, confusion. No homicidal or suicidal ideation.  Heme: Denies bruising, bleeding, and enlarged lymph nodes.   Physical Exam   BP (!) 152/85 (BP Location: Left Arm, Patient Position: Sitting, Cuff Size: Normal)   Pulse 80   Temp 97.7 F (36.5 C) (Temporal)   Ht '5\' 4"'$  (1.626 m)   Wt 178 lb (80.7 kg)   SpO2 96%   BMI 30.55 kg/m   General:   Alert and oriented. No distress noted. Pleasant and cooperative.  Head:  Normocephalic and atraumatic. Eyes:  Conjuctiva clear without scleral icterus. Mouth:  Oral mucosa pink and moist. Good dentition. No lesions. Lungs:  Clear to auscultation bilaterally. No wheezes, rales, or rhonchi. No distress.  Heart:  S1, S2 present without murmurs appreciated.  Abdomen:  +BS, soft, non-tender and non-distended. No rebound or guarding. No HSM or masses noted. Rectal: Deferred Msk:  Symmetrical without gross deformities.  Normal posture. Extremities:  Without edema. Neurologic:  Alert and  oriented x4 Psych:  Alert and cooperative. Normal mood and  affect.   Assessment  Krystal Silva is a 65 y.o. female with a history of MS, HLD, GERD and intermittent episodes of N/V with associated acute on chronic diarrhea (likely IBS) presenting today with more frequent nausea/vomiting episodes.   GERD: On pantoprazole 40 mg once daily.  Denies any typical reflux symptoms.  No alarm symptoms present.  Suspect some of her nausea/vomiting episodes may be related to chronic gastritis revealed on EGD in 2022.  We will increase pantoprazole to 40 mg twice daily.  GERD diet/lifestyle modifications reinforced.  Nausea/vomiting: As stated above a feeling this is likely refractory to her GERD.  She may not have typical reflux symptoms while on pantoprazole 40 mg once daily but given her gastritis found on her last EGD and her intermittent symptoms this is the most likely etiology.  For now we will increase pantoprazole to 40 mg twice daily to see if this helps resolve frequency of her symptoms.  Loose stools: Chronic in nature.  Prior work-up negative with GI pathogen and C. difficile panel negative x2, celiac serologies and TSH normal.  Duodenal biopsies have been benign.  Likely has IBS.  She is up-to-date on colonoscopy which previously revealed colonic diverticulosis and had random colon biopsies with focal active colitis with main consideration of acute self-limiting infectious colitis versus NSAID induced colitis.  We will repeat colonoscopy in 2032. Has been laying off imodium recently but will take if she needs to.  She reports family history of looser stools as well.  Encouraged her to continue using Imodium as she needs to especially when going out to dinner.  Encouraged to follow lactose free diet or using Lactaid when consuming lactose products to see if this helps.  PLAN   Pantoprazole 40 mg BID. GERD diet/lifestyle  modifications. Continue Imodium as needed May use simethicone as needed for gas/flatulence Recommend avoiding lactose or using Lactaid when consuming lactose products. Continue Zofran as needed.  Follow-up in 4 months    Venetia Night, MSN, FNP-BC, AGACNP-BC [facility]

## 2021-12-01 ENCOUNTER — Ambulatory Visit (INDEPENDENT_AMBULATORY_CARE_PROVIDER_SITE_OTHER): Payer: Commercial Managed Care - PPO | Admitting: Gastroenterology

## 2021-12-01 ENCOUNTER — Encounter: Payer: Self-pay | Admitting: Gastroenterology

## 2021-12-01 VITALS — BP 152/85 | HR 80 | Temp 97.7°F | Ht 64.0 in | Wt 178.0 lb

## 2021-12-01 DIAGNOSIS — R1115 Cyclical vomiting syndrome unrelated to migraine: Secondary | ICD-10-CM | POA: Diagnosis not present

## 2021-12-01 DIAGNOSIS — R197 Diarrhea, unspecified: Secondary | ICD-10-CM | POA: Diagnosis not present

## 2021-12-01 DIAGNOSIS — K219 Gastro-esophageal reflux disease without esophagitis: Secondary | ICD-10-CM

## 2021-12-01 DIAGNOSIS — E611 Iron deficiency: Secondary | ICD-10-CM

## 2021-12-01 MED ORDER — PANTOPRAZOLE SODIUM 40 MG PO TBEC: 40.0000 mg | DELAYED_RELEASE_TABLET | Freq: Two times a day (BID) | ORAL | 3 refills | Status: DC

## 2021-12-01 NOTE — Patient Instructions (Addendum)
For your intermittent nausea/vomiting episodes I want you to begin taking her pantoprazole 40 mg twice a day, 30 minutes prior to breakfast and dinner.  I would like to see if this is some ongoing gastritis that is contributing to the cyclical symptoms.  If the nausea/vomiting begins to occur again, which you to start taking the Zofran the moment you feel the symptoms prior to any actual vomiting episodes to hopefully prevent this from occurring.  Follow a GERD diet:  Avoid fried, fatty, greasy, spicy, citrus foods. Avoid caffeine and carbonated beverages, and chocolate. Try eating 4-6 small meals a day rather than 3 large meals. Do not eat within 3 hours of laying down. Prop head of bed up on wood or bricks to create a 6 inch incline  Continue to take Imodium as needed, I would encourage you to take this especially when you go out to dinner to avoid any urgency with loose stools.  Also be a good idea to keep some Lactaid pills on hand if consuming any dairy products.  Altogether though would avoid as much lactose-containing products as possible.  For any frequent flatulence/gas, you may pick up simethicone over-the-counter from the drugstore.  Follow-up in 4 months.  It was a pleasure to see you today. I want to create trusting relationships with patients. If you receive a survey regarding your visit,  I greatly appreciate you taking time to fill this out on paper or through your MyChart. I value your feedback.  Venetia Night, MSN, FNP-BC, AGACNP-BC [facility]

## 2021-12-03 ENCOUNTER — Ambulatory Visit (INDEPENDENT_AMBULATORY_CARE_PROVIDER_SITE_OTHER): Payer: Commercial Managed Care - PPO | Admitting: Nurse Practitioner

## 2021-12-03 VITALS — BP 112/82 | HR 85 | Temp 96.8°F | Ht 64.0 in | Wt 176.0 lb

## 2021-12-03 DIAGNOSIS — Z01419 Encounter for gynecological examination (general) (routine) without abnormal findings: Secondary | ICD-10-CM | POA: Diagnosis not present

## 2021-12-03 DIAGNOSIS — R922 Inconclusive mammogram: Secondary | ICD-10-CM

## 2021-12-03 NOTE — Patient Instructions (Signed)
Krystal Silva for vaginal dryness Recommend shingles vaccine at local pharmacy

## 2021-12-03 NOTE — Progress Notes (Unsigned)
Subjective:    Patient ID: Krystal Silva, female    DOB: [birth year], 66 y.o.   MRN: [id number]  HPI The patient comes in today for a wellness visit.    A review of their health history was completed.  A review of medications was also completed.  Any needed refills; none  Eating habits: good  Falls/  MVA accidents in past few months: no  Regular exercise: no  Specialist pt sees on regular basis: neurology, dermatology, gastroenterology  Preventative health issues were discussed.   Additional concerns: none Gets regular skin cancer screening.  No alcohol or tobacco use.  Her diet consist a lot of sandwiches.  States she "craves sugar".  Has had a colonoscopy and mammogram.  Regular vision and dental exams. No pelvic pain or bleeding.  No new sexual partners.  Currently not sexually active.  Review of Systems  Constitutional:  Negative for activity change, appetite change and fatigue.  HENT:  Negative for sore throat and trouble swallowing.   Respiratory:  Negative for cough, chest tightness, shortness of breath and wheezing.   Cardiovascular:  Negative for chest pain.  Gastrointestinal:  Negative for abdominal distention, abdominal pain, constipation, diarrhea, nausea and vomiting.  Genitourinary:  Negative for difficulty urinating, dysuria, enuresis, frequency, genital sores, menstrual problem, pelvic pain, urgency and vaginal discharge.      12/03/2021   10:56 AM  Depression screen PHQ 2/9  Decreased Interest 0  Down, Depressed, Hopeless 0  PHQ - 2 Score 0  Altered sleeping 0  Tired, decreased energy 0  Change in appetite 0  Feeling bad or failure about yourself  0  Trouble concentrating 0  Moving slowly or fidgety/restless 0  Suicidal thoughts 0  PHQ-9 Score 0  Difficult doing work/chores Not difficult at all        Objective:   Physical Exam Vitals and nursing note reviewed.  Constitutional:      General: She is not in acute distress.    Appearance: She  is well-developed.  Neck:     Thyroid: No thyromegaly.     Trachea: No tracheal deviation.     Comments: Thyroid non tender to palpation. No mass or goiter noted.  Cardiovascular:     Rate and Rhythm: Normal rate and regular rhythm.     Heart sounds: Normal heart sounds. No murmur heard. Pulmonary:     Effort: Pulmonary effort is normal.     Breath sounds: Normal breath sounds.  Chest:  Breasts:    Right: No swelling, inverted nipple, mass, skin change or tenderness.     Left: No swelling, inverted nipple, mass, skin change or tenderness.  Abdominal:     General: There is no distension.     Palpations: Abdomen is soft.     Tenderness: There is no abdominal tenderness.  Genitourinary:    Comments: Defers GU exam.  Denies rashes lesions irritation or discharge. Musculoskeletal:     Cervical back: Normal range of motion and neck supple.  Lymphadenopathy:     Cervical: No cervical adenopathy.     Upper Body:     Right upper body: No supraclavicular, axillary or pectoral adenopathy.     Left upper body: No supraclavicular, axillary or pectoral adenopathy.  Skin:    General: Skin is warm and dry.     Findings: No rash.  Neurological:     Mental Status: She is alert and oriented to person, place, and time.  Psychiatric:  Mood and Affect: Mood normal.        Behavior: Behavior normal.        Thought Content: Thought content normal.        Judgment: Judgment normal.    Today's Vitals   12/03/21 1044 12/03/21 1157  BP: (!) 108/58 112/82  Pulse: 85   Temp: (!) 96.8 F (36 C)   SpO2: 98%   Weight: 176 lb (79.8 kg)   Height: '5\' 4"'$  (1.626 m)    Body mass index is 30.21 kg/m.         Assessment & Plan:   Problem List Items Addressed This Visit       Other   Dense breasts   Other Visit Diagnoses     Well woman exam    -  Primary      Patient has a history of breast density class C.  Her Tyer Cusick score today is 14.9%.  No further testing is needed at  this time. Patient's blood pressure was running slightly low in the office.  She reports minimal dizziness and no syncopal episodes.  Recommend she monitor her blood pressure outside of the office and call back with results.  Avoid sudden change in position and increase hydration especially with hot weather. Return in about 6 months (around 06/05/2022) for Recheck on Zoloft.

## 2021-12-05 ENCOUNTER — Encounter: Payer: Self-pay | Admitting: Nurse Practitioner

## 2021-12-05 DIAGNOSIS — R922 Inconclusive mammogram: Secondary | ICD-10-CM | POA: Insufficient documentation

## 2021-12-12 ENCOUNTER — Other Ambulatory Visit: Payer: Self-pay | Admitting: Family Medicine

## 2021-12-12 DIAGNOSIS — I10 Essential (primary) hypertension: Secondary | ICD-10-CM

## 2021-12-13 ENCOUNTER — Ambulatory Visit: Payer: Commercial Managed Care - PPO | Admitting: Gastroenterology

## 2021-12-15 ENCOUNTER — Telehealth: Payer: Self-pay | Admitting: *Deleted

## 2021-12-15 NOTE — Telephone Encounter (Signed)
Phone call for patient stating otc triple paste not helping and you told her you would call  in prescription.

## 2021-12-17 ENCOUNTER — Encounter: Payer: Self-pay | Admitting: Dermatology

## 2021-12-17 NOTE — Progress Notes (Signed)
   Follow-Up Visit   Subjective  Krystal Silva is a 66 y.o. female who presents for the following: Annual Exam (Here for annual skin exam. No concerns. History of BCC. ).  Annual skin check, rash at corner of mouth Location:  Duration:  Quality:  Associated Signs/Symptoms: Modifying Factors:  Severity:  Timing: Context:   Objective  Well appearing patient in no apparent distress; mood and affect are within normal limits. No atypical nevi or signs of new or recurrent NMSC noted at the time of the visit. Melanoic macule lower lip, Scar like lesion Skin , anterior shoulder, shave biopsy 2017  showed BENIGN EPIDERMAL HYPERPLASIA WTH FIBROSIS,  Left Upper Vermilion Lip Corners of the mouth current treatment from pcp triamcinolone.  Focal erythema increases, no pustules or satellite lesions, no intraoral pathology    A full examination was performed including scalp, head, eyes, ears, nose, lips, neck, chest, axillae, abdomen, back, buttocks, bilateral upper extremities, bilateral lower extremities, hands, feet, fingers, toes, fingernails, and toenails. All findings within normal limits unless otherwise noted below.  Areas beneath undergarments not fully examined.   Assessment & Plan    Screening exam for skin cancer  Lesion left ant shoulder over the counter hydrocortisone (inflammatory).  Annual skin check  Angular cheilitis Left Upper Vermilion Lip  Angular Celitis corners of the mouth treatment no cure Triple paste AF over the counter diaper rash cream nightly for 2 weeks.      I, Lavonna Monarch, MD, have reviewed all documentation for this visit.  The documentation on 12/17/21 for the exam, diagnosis, procedures, and orders are all accurate and complete.

## 2021-12-20 ENCOUNTER — Other Ambulatory Visit: Payer: Self-pay | Admitting: Family Medicine

## 2021-12-20 DIAGNOSIS — E781 Pure hyperglyceridemia: Secondary | ICD-10-CM

## 2021-12-20 MED ORDER — TACROLIMUS 0.1 % EX OINT: TOPICAL_OINTMENT | Freq: Two times a day (BID) | CUTANEOUS | 6 refills | Status: DC

## 2021-12-20 NOTE — Telephone Encounter (Signed)
Phone call to patient to let her know that we can send in topical tacrolimus for her to try. She will call if its too expensive.

## 2021-12-20 NOTE — Addendum Note (Signed)
Addended by: Zenia Resides on: 12/20/2021 12:12 PM   Modules accepted: Orders

## 2021-12-29 ENCOUNTER — Encounter: Payer: Self-pay | Admitting: Dermatology

## 2022-01-17 ENCOUNTER — Other Ambulatory Visit: Payer: Self-pay | Admitting: Neurology

## 2022-01-20 ENCOUNTER — Other Ambulatory Visit: Payer: Self-pay | Admitting: Family Medicine

## 2022-01-20 DIAGNOSIS — I10 Essential (primary) hypertension: Secondary | ICD-10-CM

## 2022-01-20 DIAGNOSIS — E781 Pure hyperglyceridemia: Secondary | ICD-10-CM

## 2022-02-06 ENCOUNTER — Other Ambulatory Visit: Payer: Self-pay | Admitting: Family Medicine

## 2022-02-06 DIAGNOSIS — F418 Other specified anxiety disorders: Secondary | ICD-10-CM

## 2022-03-25 ENCOUNTER — Encounter: Payer: Self-pay | Admitting: Family Medicine

## 2022-03-25 ENCOUNTER — Other Ambulatory Visit: Payer: Self-pay | Admitting: Nurse Practitioner

## 2022-03-25 DIAGNOSIS — E876 Hypokalemia: Secondary | ICD-10-CM

## 2022-03-25 MED ORDER — POTASSIUM CHLORIDE CRYS ER 20 MEQ PO TBCR: 20.0000 meq | EXTENDED_RELEASE_TABLET | Freq: Two times a day (BID) | ORAL | 0 refills | Status: DC

## 2022-03-25 MED ORDER — POTASSIUM CHLORIDE CRYS ER 10 MEQ PO TBCR: 10.0000 meq | EXTENDED_RELEASE_TABLET | Freq: Every day | ORAL | 1 refills | Status: DC

## 2022-03-25 NOTE — Telephone Encounter (Signed)
Nilda Simmer, NP     That is very low. I will send in 20 meq twice a day over the next week then resume her 10 meq. Repeat potassium in one week. Thanks.

## 2022-03-30 ENCOUNTER — Encounter: Payer: Self-pay | Admitting: Neurology

## 2022-03-30 ENCOUNTER — Ambulatory Visit (INDEPENDENT_AMBULATORY_CARE_PROVIDER_SITE_OTHER): Payer: Commercial Managed Care - PPO | Admitting: Neurology

## 2022-03-30 VITALS — BP 130/86 | HR 75 | Ht 64.0 in | Wt 174.5 lb

## 2022-03-30 DIAGNOSIS — G35 Multiple sclerosis: Secondary | ICD-10-CM | POA: Diagnosis not present

## 2022-03-30 DIAGNOSIS — F418 Other specified anxiety disorders: Secondary | ICD-10-CM

## 2022-03-30 DIAGNOSIS — R5383 Other fatigue: Secondary | ICD-10-CM | POA: Diagnosis not present

## 2022-03-30 DIAGNOSIS — R208 Other disturbances of skin sensation: Secondary | ICD-10-CM

## 2022-03-30 DIAGNOSIS — G2581 Restless legs syndrome: Secondary | ICD-10-CM | POA: Diagnosis not present

## 2022-03-30 DIAGNOSIS — R269 Unspecified abnormalities of gait and mobility: Secondary | ICD-10-CM

## 2022-03-30 NOTE — Progress Notes (Signed)
[facility]  PATIENT: Krystal Silva DOB: Dec 07, 1955  REFERRING DOCTOR OR PCP:   Coralie Keens SOURCE: patient, records from initial Neurology group and MRI images on PACS/CD  _________________________________     HISTORICAL  CHIEF COMPLAINT:  Chief Complaint  Patient presents with   Follow-up    Pt in room #10 and alone. Pt here today for f/u on Avonex for her MS.    HISTORY OF PRESENT ILLNESS:  Krystal Silva is a 66 y.o.  woman with relapsing remitting MS diagnosed in 2006.  Update 03/30/2022: She stopped Avonex around March 2023 -- she had had no exacerbation or MRI change > 10 years while on Avonex.  Only 2 exacerbations were 2003 and 2006.   She has no definite new MS symptoms x years.          MRI of the brain 03/24/2021 showed multiple supratentorial WM lesions in pattern c/w MS (mostly periventricular radially oriented).  No new lesions and no change compared to 2019     Her gait is stable.   Her balance is slightly off but no falls   Knees bother gait more than MS.   Legs are strong.  She has mild spasticity, helped by baclofen.  No new sensory changes.  She has mild dysesthesias in the limbs, helped by imipramine but she was able to cut back as symptoms are milder.   Bladder function is fine.   Vision is mostly ok but cloudy if tired  abd longer time looking at monitor.  She sees eye doctor in March.     She notes less  fatigue, off the Avonex.  She is generally sleeping well.    She still does worse in the heat.     She is sleeping well.   Mood is doing well.   She notes stress at work. .  She is on sertraline.    She does not fall asleep in the evenings on weekdays but may on weekends.   She has some RLS at night, some nights worse than others and worse in a chair/sofa than in bed.   She never took gabapentin  She has knee pain, worse in cold weather.     She had a basal cell carcinoma removed.   She  sees dermatology regularly   Vit D was low and she takes supplements       MS History:   In 2003, she had an episode of diplopia. An MRI was performed but she was not diagnosed with MS at that time. In 2006, in [place],  she presented with dysesthetic sensations in the legs, mostly around the shin. She had MRI's performed that were more consistent with MS. She also had a lumbar puncture and the CSF was consistent with MS. The CSF showed oligoclonal bands and IgG index of 1.37 (elevated). She started to see Dr. George Hugh and then Dr. Shelia Media in 2010/2011 after moving to the area.    An MRI performed in 2011 was reportedly very similar to the one from 2006. She has been on Avonex therapy since diagnosis..  IMAGING: MRI Brain 04/23/2015 showed  T2/FLAIR hyperintense foci in the periventricular, deep and juxtacortical white matter. The pattern is consistent with multiple sclerosis. There are no enhancing lesions. When compared to the MRI dated 08/12/2013, there is no definite change.  REVIEW OF SYSTEMS: Constitutional: No fevers, chills, sweats, or change in appetite Eyes: No visual changes, double vision, eye pain Ear, nose and  throat: No hearing loss, ear pain, nasal congestion, sore throat Cardiovascular: No chest pain, palpitations Respiratory:  No shortness of breath at rest or with exertion.   No wheezes GastrointestinaI: No nausea, vomiting, diarrhea, abdominal pain, fecal incontinence Genitourinary:  No dysuria, urinary retention or frequency.  No nocturia. Musculoskeletal:  No neck pain, back pain Integumentary: No rash, pruritus, skin lesions Neurological: as above Psychiatric: No depression at this time.  No anxiety Endocrine: No palpitations, diaphoresis, change in appetite, change in weigh or increased thirst Hematologic/Lymphatic:  No anemia, purpura, petechiae. Allergic/Immunologic: No itchy/runny eyes, nasal congestion, recent allergic reactions, rashes  ALLERGIES: Allergies   Allergen Reactions   Cefzil [Cefprozil] Diarrhea   Codeine Nausea And Vomiting   Penicillins Rash   Sulfonamide Derivatives Rash    HOME MEDICATIONS:  Current Outpatient Medications:    baclofen (LIORESAL) 10 MG tablet, TAKE 1 TABLET (10 MG TOTAL) BY MOUTH AT BEDTIME. MAKE TAKE AN ADDITIONAL DOSE IF NEEDED, Disp: 180 tablet, Rfl: 5   cetirizine (ZYRTEC) 10 MG tablet, Take 10 mg by mouth daily as needed for allergies., Disp: , Rfl:    Cholecalciferol (VITAMIN D-3) 125 MCG (5000 UT) TABS, Take 1 tablet by mouth daily., Disp: 90 tablet, Rfl: 1   diphenhydrAMINE (BENADRYL) 25 MG tablet, Take 25 mg by mouth at bedtime as needed for allergies., Disp: , Rfl:    ibuprofen (ADVIL) 200 MG tablet, Take 400 mg by mouth every 6 (six) hours as needed for headache or moderate pain., Disp: , Rfl:    imipramine (TOFRANIL) 25 MG tablet, TAKE 1 TO 2 TABLETS BY MOUTH AT BEDTIME, Disp: 180 tablet, Rfl: 1   Lidocaine (BLUE-EMU PAIN RELIEF DRY EX), Apply 1 application topically daily., Disp: , Rfl:    loperamide (IMODIUM) 2 MG capsule, Take 2 mg by mouth as needed for diarrhea or loose stools., Disp: , Rfl:    losartan-hydrochlorothiazide (HYZAAR) 100-12.5 MG tablet, TAKE 1 TABLET BY MOUTH EVERY DAY FOR BLOOD PRESSURE, Disp: 90 tablet, Rfl: 0   ondansetron (ZOFRAN-ODT) 4 MG disintegrating tablet, PLACE 1 TABLET (4 MG TOTAL) UNDER YOUR TONGUE EVERY 6-8 HOURS AS NEEDED FOR NAUSEA/VOMITING., Disp: 30 tablet, Rfl: 1   pantoprazole (PROTONIX) 40 MG tablet, Take 1 tablet (40 mg total) by mouth 2 (two) times daily before a meal., Disp: 90 tablet, Rfl: 3   potassium chloride (KLOR-CON M10) 10 MEQ tablet, Take 1 tablet (10 mEq total) by mouth daily., Disp: 90 tablet, Rfl: 1   potassium chloride SA (KLOR-CON M) 20 MEQ tablet, Take 1 tablet (20 mEq total) by mouth 2 (two) times daily., Disp: 14 tablet, Rfl: 0   Probiotic Product (PROBIOTIC DAILY PO), Take 4 capsules by mouth daily., Disp: , Rfl:    sertraline (ZOLOFT) 100  MG tablet, TAKE 1 TABLET BY MOUTH EVERY DAY, Disp: 90 tablet, Rfl: 0   simvastatin (ZOCOR) 40 MG tablet, TAKE 1 TABLET BY MOUTH EVERYDAY AT BEDTIME, Disp: 90 tablet, Rfl: 0   tacrolimus (PROTOPIC) 0.1 % ointment, Apply topically 2 (two) times daily., Disp: 100 g, Rfl: 6  PAST MEDICAL HISTORY: Past Medical History:  Diagnosis Date   Allergy    Basal cell carcinoma 09/04/2019   right parotid area - Refer to [facility]   Diverticulitis    GERD (gastroesophageal reflux disease)    Hypercholesteremia    Hypertension    Insomnia    MS (multiple sclerosis) (HCC)    Vitamin D deficiency     PAST SURGICAL HISTORY: Past Surgical History:  Procedure Laterality Date   BIOPSY  08/03/2020   Procedure: BIOPSY;  Surgeon: Eloise Harman, DO;  Location: [facility];  Service: Endoscopy;;   BIOPSY  10/26/2020   Procedure: BIOPSY;  Surgeon: Eloise Harman, DO;  Location: [facility];  Service: Endoscopy;;   COLONOSCOPY  08/21/2009   COLONOSCOPY WITH PROPOFOL N/A 08/03/2020    Surgeon: Eloise Harman, DO;  internal hemorrhoids, diverticulosis in the sigmoid, descending, and transverse colon, otherwise normal exam.  Biopsies were taken to evaluate for microscopic colitis.  Recommended repeat colonoscopy in 10 years.    ESOPHAGOGASTRODUODENOSCOPY (EGD) WITH PROPOFOL N/A 10/26/2020   Surgeon: Eloise Harman, DO; Gastritis s/p biopsy (negative for H. pylori), normal examined duodenum biopsied (benign), nonobstructing Schatzki's ring, not manipulated.   KNEE ARTHROSCOPY WITH MEDIAL MENISECTOMY Right 03/27/2015   Procedure: KNEE ARTHROSCOPY WITH PARTIAL MEDIAL MENISECTOMY AND MICROFRACTURE;  Surgeon: Carole Civil, MD;  Location: [facility];  Service: Orthopedics;  Laterality: Right;   TONSILLECTOMY     WISDOM TOOTH EXTRACTION  75's    FAMILY HISTORY: Family History  Problem Relation Age of Onset   Hypertension Mother    Hyperlipidemia Father    Hearing loss Father    Hyperlipidemia  Brother    Diabetes Brother    Vision loss Maternal Grandmother    Vision loss Paternal Grandmother    Cancer Other        breast   Inflammatory bowel disease Neg Hx    Colon cancer Neg Hx    Ulcerative colitis Neg Hx    Crohn's disease Neg Hx     SOCIAL HISTORY:  Social History   Socioeconomic History   Marital status: Divorced    Spouse name: Not on file   Number of children: Not on file   Years of education: 68   Highest education level: Not on file  Occupational History   Occupation: work   Tobacco Use   Smoking status: Former    Packs/day: 1.50    Years: 30.00    Total pack years: 45.00    Types: Cigarettes    Start date: 12/26/1974    Quit date: 06/22/2004    Years since quitting: 17.7   Smokeless tobacco: Never  Vaping Use   Vaping Use: Never used  Substance and Sexual Activity   Alcohol use: Yes    Alcohol/week: 1.0 standard drink of alcohol    Types: 1 Cans of beer per week    Comment: rare social   Drug use: No   Sexual activity: Not Currently  Other Topics Concern   Not on file  Social History Narrative   Coffee in the morning couple of cups    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   General: The patient is well-developed and well-nourished and in no acute distress  Neurologic Exam  Mental status: The patient is alert and oriented x 3 at the  time of the examination. The patient has apparent normal recent and remote memory, with an apparently normal attention span and  concentration ability.   Speech is normal.  Cranial nerves: Extraocular movements are full.  Her facial strength and sensation is normal.  The trapezius strength is normal.  Hearing is normal and symmetric.  Motor:  Muscle bulk is normal.   Tone is mildly increased in legs. Strength is  5 / 5 in all 4 extremities except 4+/5 in right EHL.   Sensory: Sensory testing intact to touch and vibration in the arms and legs.  Coordination: Finger-nose-finger and heel-to-shin is performed well.  Gait and station: Station is normal.   The gait has a mildly reduced stride.  Tandem gait is poor.  Romberg is negative..   Reflexes: Deep tendon reflexes are symmetric and increased bilaterally with  2 beats non-sustained clonus at ankles.         Multiple sclerosis ([place]) - Plan: MR BRAIN W WO CONTRAST, MR CERVICAL SPINE W WO CONTRAST  Gait disturbance - Plan: MR BRAIN W WO CONTRAST, MR CERVICAL SPINE W WO CONTRAST  Restless leg syndrome  Other fatigue  Depression with anxiety  Dysesthesia  1.   We discussed discontinuing DMTs as > 38 yo, no change on MRI in last decade and no exacerbaitn since 2006.   For now she will cut down to every other week  Avonex.  Check MRI of the brain and cervical spine to determine if she is having any subclinical progression.  If present, we would need to consider restarting Avonex or a different disease modifying therapy.   2.   Continue baclofen for spasticity and sertraline and imipramine for mood these have recently been refilled. 3.   return in 6 months or sooner if there are new or worsening neurologic symptoms.  Jeiry Birnbaum A. Felecia Shelling, MD, PhD 42/12/7679, 15:72 AM Certified in Neurology, Clinical Neurophysiology, Sleep Medicine, Pain Medicine and Neuroimaging  [facility] [address], [place], [place] [address] [phone]

## 2022-04-01 NOTE — Progress Notes (Signed)
GI Office Note    Referring Provider: Coral Spikes, DO Primary Care Physician:  Coral Spikes, DO Primary Gastroenterologist: Elon Alas. Abbey Chatters, DO  Date:  04/04/2022  ID:  Krystal Silva, DOB 1955-07-03, MRN [id number]   Chief Complaint   Chief Complaint  Patient presents with   Follow-up    History of Present Illness  Krystal Silva is a 66 y.o. female with a history of GERD, chronic diarrhea, and intermittent episodes of N/V presenting today for follow-up.  Colonoscopy March 2022: - Non bleeding internal hemorrhoids - Colonic diverticulosis - Random colonic biopsies with focal active colitis with main consideration of acute self-limiting infectious colitis versus NSAID induced colitis  EGD June 2022: -Gastritis s/p benign biopsy - Normal duodenum with benign biopsies.  Prior work-up for diarrhea: -Negative GI pathogen panel and C. Difficile x2 -Negative celiac serologies -TSH within normal limits.  Last office visit 12/01/2021: Patient reported 2 episodes of diarrhea in June 2023 and a previous episode on July 4th.  Also reported occasional episodes of vomiting usually this happens it may occur 10+ times per day and that she gets hoarse.  Zofran has been helpful.  Taking PPI daily, having occasional abdominal pain usually related to vomiting.  Reported stressful job.  PPI increased to twice daily, GERD diet and lifestyle modifications reinforced.  Advised to use Imodium as needed as well as simethicone as needed for gas. Recommended following a lactose-free diet. Continue Zofran as needed   Today: GERD - Controlled with pantoprazole.  Has reported some lower extremity muscle weakness/fatigue.  Diarrhea - Having at least 2 BM per day and using imodium helps. Does not get constipated from it. Still has urgency. Usually first thing in the morning. No nocturnal stools.  Denies any overt abdominal pain, melena, or BRBPR.  Potassium has been low. Currently on supplementation.    Nausea - Had an episode September 3th/4th and the 29th. Does have a lot of stress at work.  Zofran is helpful    Current Outpatient Medications  Medication Sig Dispense Refill   baclofen (LIORESAL) 10 MG tablet TAKE 1 TABLET (10 MG TOTAL) BY MOUTH AT BEDTIME. MAKE TAKE AN ADDITIONAL DOSE IF NEEDED 180 tablet 5   cetirizine (ZYRTEC) 10 MG tablet Take 10 mg by mouth daily as needed for allergies.     Cholecalciferol (VITAMIN D-3) 125 MCG (5000 UT) TABS Take 1 tablet by mouth daily. 90 tablet 1   diphenhydrAMINE (BENADRYL) 25 MG tablet Take 25 mg by mouth at bedtime as needed for allergies.     ibuprofen (ADVIL) 200 MG tablet Take 400 mg by mouth every 6 (six) hours as needed for headache or moderate pain.     imipramine (TOFRANIL) 25 MG tablet TAKE 1 TO 2 TABLETS BY MOUTH AT BEDTIME 180 tablet 1   Lidocaine (BLUE-EMU PAIN RELIEF DRY EX) Apply 1 application topically daily.     loperamide (IMODIUM) 2 MG capsule Take 2 mg by mouth as needed for diarrhea or loose stools.     losartan-hydrochlorothiazide (HYZAAR) 100-12.5 MG tablet TAKE 1 TABLET BY MOUTH EVERY DAY FOR BLOOD PRESSURE 90 tablet 0   ondansetron (ZOFRAN-ODT) 4 MG disintegrating tablet PLACE 1 TABLET (4 MG TOTAL) UNDER YOUR TONGUE EVERY 6-8 HOURS AS NEEDED FOR NAUSEA/VOMITING. 30 tablet 1   pantoprazole (PROTONIX) 40 MG tablet Take 1 tablet (40 mg total) by mouth 2 (two) times daily before a meal. 90 tablet 3   potassium chloride (KLOR-CON M10) 10 MEQ  tablet Take 1 tablet (10 mEq total) by mouth daily. 90 tablet 1   potassium chloride SA (KLOR-CON M) 20 MEQ tablet Take 1 tablet (20 mEq total) by mouth 2 (two) times daily. 14 tablet 0   Probiotic Product (PROBIOTIC DAILY PO) Take 4 capsules by mouth daily.     sertraline (ZOLOFT) 100 MG tablet TAKE 1 TABLET BY MOUTH EVERY DAY 90 tablet 0   simvastatin (ZOCOR) 40 MG tablet TAKE 1 TABLET BY MOUTH EVERYDAY AT BEDTIME 90 tablet 0   No current facility-administered medications for this  visit.    Past Medical History:  Diagnosis Date   Allergy    Basal cell carcinoma 09/04/2019   right parotid area - Refer to [facility]   Diverticulitis    GERD (gastroesophageal reflux disease)    Hypercholesteremia    Hypertension    Insomnia    MS (multiple sclerosis) ([place])    Vitamin D deficiency     Past Surgical History:  Procedure Laterality Date   BIOPSY  08/03/2020   Procedure: BIOPSY;  Surgeon: Eloise Harman, DO;  Location: [facility];  Service: Endoscopy;;   BIOPSY  10/26/2020   Procedure: BIOPSY;  Surgeon: Eloise Harman, DO;  Location: [facility];  Service: Endoscopy;;   COLONOSCOPY  08/21/2009   COLONOSCOPY WITH PROPOFOL N/A 08/03/2020    Surgeon: Eloise Harman, DO;  internal hemorrhoids, diverticulosis in the sigmoid, descending, and transverse colon, otherwise normal exam.  Biopsies were taken to evaluate for microscopic colitis.  Recommended repeat colonoscopy in 10 years.    ESOPHAGOGASTRODUODENOSCOPY (EGD) WITH PROPOFOL N/A 10/26/2020   Surgeon: Eloise Harman, DO; Gastritis s/p biopsy (negative for H. pylori), normal examined duodenum biopsied (benign), nonobstructing Schatzki's ring, not manipulated.   KNEE ARTHROSCOPY WITH MEDIAL MENISECTOMY Right 03/27/2015   Procedure: KNEE ARTHROSCOPY WITH PARTIAL MEDIAL MENISECTOMY AND MICROFRACTURE;  Surgeon: Carole Civil, MD;  Location: [facility];  Service: Orthopedics;  Laterality: Right;   TONSILLECTOMY     WISDOM TOOTH EXTRACTION  58's    Family History  Problem Relation Age of Onset   Hypertension Mother    Hyperlipidemia Father    Hearing loss Father    Hyperlipidemia Brother    Diabetes Brother    Vision loss Maternal Grandmother    Vision loss Paternal Grandmother    Cancer Other        breast   Inflammatory bowel disease Neg Hx    Colon cancer Neg Hx    Ulcerative colitis Neg Hx    Crohn's disease Neg Hx     Allergies as of 04/04/2022 - Review Complete 04/04/2022  Allergen  Reaction Noted   Cefzil [cefprozil] Diarrhea 03/31/2019   Codeine Nausea And Vomiting    Penicillins Rash 08/11/2009   Sulfonamide derivatives Rash     Social History   Socioeconomic History   Marital status: Divorced    Spouse name: Not on file   Number of children: Not on file   Years of education: 18   Highest education level: Not on file  Occupational History   Occupation: work   Tobacco Use   Smoking status: Former    Packs/day: 1.50    Years: 30.00    Total pack years: 45.00    Types: Cigarettes    Start date: 12/26/1974    Quit date: 06/22/2004    Years since quitting: 17.7   Smokeless tobacco: Never  Vaping Use   Vaping Use: Never used  Substance and Sexual  Activity   Alcohol use: Yes    Alcohol/week: 1.0 standard drink of alcohol    Types: 1 Cans of beer per week    Comment: rare social   Drug use: No   Sexual activity: Not Currently  Other Topics Concern   Not on file  Social History Narrative   Coffee in the morning couple of cups    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 Denies fatigue, weakness, weight loss.  CV: Denies chest pain, palpitations, syncope, peripheral edema, and claudication. Resp: Denies dyspnea at rest, cough, wheezing, coughing up blood, and pleurisy. GI: See HPI Derm: Denies rash, itching, dry skin Psych: Denies depression, anxiety, memory loss, confusion. No homicidal or suicidal ideation.  Heme: Denies bruising, bleeding, and enlarged lymph nodes.   Physical Exam   BP 111/76 (BP Location: Right Arm, Patient Position: Sitting, Cuff Size: Normal)   Pulse 85   Temp (!) 96.9 F (36.1 C) (Temporal)   Ht  '5\' 4"'$  (1.626 m)   Wt 174 lb (78.9 kg)   SpO2 97%   BMI 29.87 kg/m   General:   Alert and oriented. No distress noted. Pleasant and cooperative.  Head:  Normocephalic and atraumatic. Eyes:  Conjuctiva clear without scleral icterus. Mouth:  Oral mucosa pink and moist. Good dentition. No lesions. Lungs:  Clear to auscultation bilaterally. No wheezes, rales, or rhonchi. No distress.  Heart:  S1, S2 present without murmurs appreciated.  Abdomen:  +BS, soft, non-tender and non-distended. No rebound or guarding. No HSM or masses noted. Rectal: deferred Msk:  Symmetrical without gross deformities. Normal posture. Extremities:  Without edema. Neurologic:  Alert and  oriented x4 Psych:  Alert and cooperative. Normal mood and affect.   Assessment  Krystal Silva is a 66 y.o. female with a history of GERD, chronic diarrhea, and intermittent episodes of N/V presenting today for follow-up.  GERD: Controlled with pantoprazole 40 mg twice daily however experiencing some possible side effects related to twice daily dosing.  Muscle fatigue/weakness could be secondary to hypokalemia as well but given patient's concern will back off to once daily as she had control of her GERD symptoms then as well.  Advised famotidine or Tums for breakthrough, reiterating that if Tums are needed more than 3-4 times per week the famotidine is the better option.  Nausea/vomiting: Continue with pantoprazole 40 mg daily.  Symptoms still occurring very rarely with only a couple of episodes since her last visit in July.  Does not have any associate abdominal pain.  May be secondary to some stress at work as well as a possible gallbladder etiology also given her chronic diarrhea.  Query some intermittent biliary colic versus mild gastroparesis although less likely given his known on a daily basis.  Zofran is helpful therefore we will continue this as needed.  Chronic diarrhea: Prior work-up negative with stool studies x2.  Negative  celiac serologies.  Normal TSH.  Without abdominal pain component, IBS not likely.  Query gallbladder etiology versus possible small intestinal bacterial overgrowth.  Continues to have some fecal urgency and will usually have a bowel movement first thing  in the morning.  Currently having about 2 BMs per day with using Imodium.  Currently with some hypokalemia being treated with potassium 20 mill equivalents twice daily for 1 week and then 10 mEq daily.  Discussed gallbladder work-up versus treatment with Xifaxan for small intestinal bacterial overgrowth versus cholestyramine given possible bile acid diarrhea.  Other consideration was to perform alpha-gal testing given history of tick bites years ago and chronic diarrhea.  Patient would like to discuss with her insurance regarding alpha-gal testing prior to pursuing.  For now she is happy with her current regimen for diarrhea will call the office if she decides to proceed with any further testing or management.  PLAN   Pantoprazole back to 40 mg once daily.  Famotidine 10-20 mg for breakthrough nightly Continue imodium Zofran as needed.  May trial HIDA vs cholestyramine or Xifaxin.  Consider alpha gal testing (will check with insuracne regarding cost) Follow up in 6 months   Venetia Night, MSN, FNP-BC, AGACNP-BC [facility]

## 2022-04-04 ENCOUNTER — Encounter: Payer: Self-pay | Admitting: Gastroenterology

## 2022-04-04 ENCOUNTER — Ambulatory Visit (INDEPENDENT_AMBULATORY_CARE_PROVIDER_SITE_OTHER): Payer: Commercial Managed Care - PPO | Admitting: Gastroenterology

## 2022-04-04 ENCOUNTER — Telehealth: Payer: Self-pay | Admitting: Neurology

## 2022-04-04 VITALS — BP 111/76 | HR 85 | Temp 96.9°F | Ht 64.0 in | Wt 174.0 lb

## 2022-04-04 DIAGNOSIS — K219 Gastro-esophageal reflux disease without esophagitis: Secondary | ICD-10-CM

## 2022-04-04 DIAGNOSIS — R1115 Cyclical vomiting syndrome unrelated to migraine: Secondary | ICD-10-CM | POA: Diagnosis not present

## 2022-04-04 DIAGNOSIS — R197 Diarrhea, unspecified: Secondary | ICD-10-CM

## 2022-04-04 NOTE — Telephone Encounter (Signed)
120 mins MR brain w/wo & MR cervical spine w/wo Dr. Arlyss Queen Josem Kaufmann: [id number] exp. 03/31/22-06/28/21 scheduled at [facility] 04/20/22 at 10am

## 2022-04-04 NOTE — Patient Instructions (Addendum)
Given concern for muscle weakness/fatigue we will decrease you pantoprazole back to once daily.  If you have any breakthrough symptoms you may use Tums as needed.  If you need to use this more than 3-4 times per week then begin taking famotidine and or 20 mg nightly.  Follow a GERD diet:  Avoid fried, fatty, greasy, spicy, citrus foods. Avoid caffeine and carbonated beverages. Avoid chocolate. Try eating 4-6 small meals a day rather than 3 large meals. Do not eat within 3 hours of laying down. Prop head of bed up on wood or bricks to create a 6 inch incline.   Continue using Imodium as needed for diarrhea.  If you would like we can consider testing for alpha gal (you can check with your insurance for cost).  If you decide you want to proceed just let me know and we can order it.  Also for your diarrhea in the future we could consider evaluating your gallbladder as cause of your intermittent nausea and her diarrhea with a HIDA scan (to test the function).  We could preemptively also try something like cholestyramine to help bind to bile to bulk up stool.  Another potential cause for diarrhea could be small intestinal bacterial overgrowth which we can try to reset with an antibiotic called Xifaxan for 2 weeks.  If you would like to proceed with any of these in the future just let me know.  For your nausea, continue Zofran as needed.  Just reach out for refill if needed.  We will follow-up in 6 months.  I hope you enjoy the upcoming holidays!  It was a pleasure to see you today. I want to create trusting relationships with patients. If you receive a survey regarding your visit,  I greatly appreciate you taking time to fill this out on paper or through your MyChart. I value your feedback.  Venetia Night, MSN, FNP-BC, AGACNP-BC [facility]

## 2022-04-05 LAB — BASIC METABOLIC PANEL
BUN/Creatinine Ratio: 18 (ref 12–28)
BUN: 18 mg/dL (ref 8–27)
CO2: 23 mmol/L (ref 20–29)
Calcium: 9.7 mg/dL (ref 8.7–10.3)
Chloride: 103 mmol/L (ref 96–106)
Creatinine, Ser: 0.98 mg/dL (ref 0.57–1.00)
Glucose: 116 mg/dL — ABNORMAL HIGH (ref 70–99)
Potassium: 4.2 mmol/L (ref 3.5–5.2)
Sodium: 142 mmol/L (ref 134–144)
eGFR: 64 mL/min/{1.73_m2} (ref >59)

## 2022-04-17 ENCOUNTER — Other Ambulatory Visit: Payer: Self-pay | Admitting: Neurology

## 2022-04-20 ENCOUNTER — Ambulatory Visit: Payer: Commercial Managed Care - PPO

## 2022-04-20 DIAGNOSIS — G35 Multiple sclerosis: Secondary | ICD-10-CM

## 2022-04-20 DIAGNOSIS — R269 Unspecified abnormalities of gait and mobility: Secondary | ICD-10-CM

## 2022-04-20 MED ORDER — GADOBENATE DIMEGLUMINE 529 MG/ML IV SOLN
15.0000 mL | Freq: Once | INTRAVENOUS | Status: AC | PRN
  Administered 2022-04-20: 15 mL via INTRAVENOUS

## 2022-05-05 ENCOUNTER — Ambulatory Visit (INDEPENDENT_AMBULATORY_CARE_PROVIDER_SITE_OTHER): Payer: Commercial Managed Care - PPO | Admitting: Family Medicine

## 2022-05-05 ENCOUNTER — Other Ambulatory Visit: Payer: Self-pay | Admitting: Family Medicine

## 2022-05-05 VITALS — BP 112/68 | HR 56 | Temp 97.7°F | Ht 64.0 in | Wt 170.0 lb

## 2022-05-05 DIAGNOSIS — R7989 Other specified abnormal findings of blood chemistry: Secondary | ICD-10-CM

## 2022-05-05 DIAGNOSIS — K219 Gastro-esophageal reflux disease without esophagitis: Secondary | ICD-10-CM

## 2022-05-05 DIAGNOSIS — I10 Essential (primary) hypertension: Secondary | ICD-10-CM

## 2022-05-05 DIAGNOSIS — G35 Multiple sclerosis: Secondary | ICD-10-CM

## 2022-05-05 DIAGNOSIS — E785 Hyperlipidemia, unspecified: Secondary | ICD-10-CM | POA: Diagnosis not present

## 2022-05-05 DIAGNOSIS — F418 Other specified anxiety disorders: Secondary | ICD-10-CM

## 2022-05-05 NOTE — Assessment & Plan Note (Signed)
Stable on Protonix.  Continue. 

## 2022-05-05 NOTE — Assessment & Plan Note (Signed)
Doing well at this time and is now off interferon.

## 2022-05-05 NOTE — Assessment & Plan Note (Signed)
Well-controlled.  Continue losartan/HCTZ.  Labs ordered.

## 2022-05-05 NOTE — Assessment & Plan Note (Signed)
Lipid panel ordered.  Continue simvastatin.

## 2022-05-05 NOTE — Patient Instructions (Signed)
Labs ordered (you can do them when you like; last time all of your routine labs were drawn was 2/24).  Follow up in 6 months.  I'm glad you're doing well.  Merry Christmas  Dr. Lacinda Axon

## 2022-05-05 NOTE — Progress Notes (Signed)
Subjective:  Patient ID: Krystal Silva, female    DOB: 1955-12-21  Age: 66 y.o. MRN: [id number]  CC: Chief Complaint  Patient presents with   Hypertension    6 month follow up     HPI:  66 year old female with hypertension, GERD, MS, osteoarthritis, depression and anxiety, hyperlipidemia presents for follow-up.  Patient's hypertension is well-controlled.  She is doing well on losartan/HCTZ.  Patient is doing well regarding her MS.  She is now off interferon.  Follows closely with neurology.  GERD stable on Protonix.  Patient's lipids have been fairly well-controlled on simvastatin.  Needs lipid panel.  Overall patient is feeling well.  She is very happy to be off the interferon.  Excluding shingles vaccine, her preventative health care is all up-to-date.  Patient Active Problem List   Diagnosis Date Noted   Dense breasts 12/05/2021   Hyperlipidemia 07/16/2021   Gastroesophageal reflux disease 10/12/2020   Restless leg syndrome 08/30/2017   Seasonal allergies 02/22/2017   Primary osteoarthritis of right knee    Essential hypertension, benign 05/27/2013   Multiple sclerosis ([place]) 03/06/2013   Depression with anxiety 11/02/2012    Social Hx   Social History   Socioeconomic History   Marital status: Divorced    Spouse name: Not on file   Number of children: Not on file   Years of education: 18   Highest education level: Not on file  Occupational History   Occupation: work   Tobacco Use   Smoking status: Former    Packs/day: 1.50    Years: 30.00    Total pack years: 45.00    Types: Cigarettes    Start date: 12/26/1974    Quit date: 06/22/2004    Years since quitting: 17.8   Smokeless tobacco: Never  Vaping Use   Vaping Use: Never used  Substance and Sexual Activity   Alcohol use: Yes    Alcohol/week: 1.0 standard drink of alcohol    Types: 1 Cans of beer per week    Comment: rare social   Drug use: No   Sexual activity: Not Currently  Other Topics  Concern   Not on file  Social History Narrative   Coffee in the morning couple of cups    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   Gastrointestinal:        Occasional nausea and diarrhea.   Objective:  BP 112/68   Pulse (!) 56   Temp 97.7 F (36.5 C)   Ht _0  (1.626 m)   Wt 170 lb (77.1 kg)   SpO2 99%   BMI 29.18 kg/m      05/05/2022    8:43 AM 04/04/2022    8:30 AM 03/30/2022    9:52 AM  BP/Weight  Systolic BP 921 194 174  Diastolic BP 68 76 86  Wt. (Lbs) 170 174 174.5  BMI 29.18 kg/m2 29.87 kg/m2 29.95 kg/m2    Physical Exam Vitals and nursing note reviewed.  Constitutional:      General: She is not in acute distress.    Appearance: Normal appearance.  HENT:     Head: Normocephalic and atraumatic.  Eyes:     General:        Right eye: No discharge.        Left eye: No discharge.     Conjunctiva/sclera: Conjunctivae normal.  Cardiovascular:     Rate and Rhythm: Normal rate and regular rhythm.     Heart sounds: No murmur heard. Pulmonary:     Effort: Pulmonary effort is normal.     Breath sounds: Normal breath sounds. No wheezing, rhonchi or rales.  Abdominal:     General: There is no distension.     Palpations: Abdomen is soft.     Tenderness: There is no abdominal tenderness.  Neurological:     Mental Status: She is alert. Mental status is at baseline.  Psychiatric:        Mood and Affect: Mood normal.        Behavior: Behavior normal.     Lab Results  Component Value Date   WBC 6.2 07/16/2021   HGB 13.7 07/16/2021   HCT 41.0 07/16/2021   PLT 245 07/16/2021   GLUCOSE 116 (H) 04/04/2022   CHOL 183 07/16/2021   TRIG 218 (H)  07/16/2021   HDL 45 07/16/2021   LDLCALC 101 (H) 07/16/2021   ALT 24 07/16/2021   AST 20 07/16/2021   NA 142 04/04/2022   K 4.2 04/04/2022   CL 103 04/04/2022   CREATININE 0.98 04/04/2022   BUN 18 04/04/2022   CO2 23 04/04/2022   TSH 3.850 10/12/2020     Assessment & Plan:   Problem List Items Addressed This Visit       Cardiovascular and Mediastinum   Essential hypertension, benign (Chronic)    Well-controlled.  Continue losartan/HCTZ.  Labs ordered.      Relevant Orders   CMP14+EGFR     Digestive   Gastroesophageal reflux disease    Stable on Protonix.  Continue.        Nervous and Auditory   Multiple sclerosis ([place]) - Primary (Chronic)    Doing well at this time and is now off interferon.      Relevant Orders   CBC     Other   Hyperlipidemia    Lipid panel ordered.  Continue simvastatin.      Relevant Orders   Lipid panel    Follow-up:  6 months   [place] DO [place]

## 2022-06-06 ENCOUNTER — Other Ambulatory Visit: Payer: Self-pay | Admitting: Family Medicine

## 2022-06-06 DIAGNOSIS — I10 Essential (primary) hypertension: Secondary | ICD-10-CM

## 2022-06-14 ENCOUNTER — Other Ambulatory Visit: Payer: Self-pay | Admitting: Family Medicine

## 2022-06-14 DIAGNOSIS — E781 Pure hyperglyceridemia: Secondary | ICD-10-CM

## 2022-06-23 ENCOUNTER — Encounter: Payer: Self-pay | Admitting: Nurse Practitioner

## 2022-06-28 LAB — CBC
Hematocrit: 42 % (ref 34.0–46.6)
Hemoglobin: 14.3 g/dL (ref 11.1–15.9)
MCH: 30.2 pg (ref 26.6–33.0)
MCHC: 34 g/dL (ref 31.5–35.7)
MCV: 89 fL (ref 79–97)
Platelets: 274 10*3/uL (ref 150–450)
RBC: 4.74 x10E6/uL (ref 3.77–5.28)
RDW: 12.6 % (ref 11.7–15.4)
WBC: 5.8 10*3/uL (ref 3.4–10.8)

## 2022-06-28 LAB — CMP14+EGFR
ALT: 15 IU/L (ref 0–32)
AST: 12 IU/L (ref 0–40)
Albumin/Globulin Ratio: 1.5 (ref 1.2–2.2)
Albumin: 4.3 g/dL (ref 3.9–4.9)
Alkaline Phosphatase: 94 IU/L (ref 44–121)
BUN/Creatinine Ratio: 12 (ref 12–28)
BUN: 13 mg/dL (ref 8–27)
Bilirubin Total: 0.6 mg/dL (ref 0.0–1.2)
CO2: 25 mmol/L (ref 20–29)
Calcium: 9.5 mg/dL (ref 8.7–10.3)
Chloride: 103 mmol/L (ref 96–106)
Creatinine, Ser: 1.12 mg/dL — ABNORMAL HIGH (ref 0.57–1.00)
Globulin, Total: 2.8 g/dL (ref 1.5–4.5)
Glucose: 101 mg/dL — ABNORMAL HIGH (ref 70–99)
Potassium: 3.5 mmol/L (ref 3.5–5.2)
Sodium: 144 mmol/L (ref 134–144)
Total Protein: 7.1 g/dL (ref 6.0–8.5)
eGFR: 55 mL/min/{1.73_m2} — ABNORMAL LOW (ref >59)

## 2022-06-28 LAB — LIPID PANEL
Chol/HDL Ratio: 3.1 ratio (ref 0.0–4.4)
Cholesterol, Total: 155 mg/dL (ref 100–199)
HDL: 50 mg/dL (ref >39)
LDL Chol Calc (NIH): 87 mg/dL (ref 0–99)
Triglycerides: 99 mg/dL (ref 0–149)
VLDL Cholesterol Cal: 18 mg/dL (ref 5–40)

## 2022-06-29 NOTE — Addendum Note (Signed)
Addended by: Dairl Ponder on: 06/29/2022 10:29 AM   Modules accepted: Orders

## 2022-07-11 ENCOUNTER — Other Ambulatory Visit: Payer: Self-pay | Admitting: Gastroenterology

## 2022-07-11 DIAGNOSIS — K219 Gastro-esophageal reflux disease without esophagitis: Secondary | ICD-10-CM

## 2022-07-14 ENCOUNTER — Other Ambulatory Visit: Payer: Self-pay | Admitting: Gastroenterology

## 2022-07-14 DIAGNOSIS — R112 Nausea with vomiting, unspecified: Secondary | ICD-10-CM

## 2022-08-09 ENCOUNTER — Other Ambulatory Visit: Payer: Self-pay | Admitting: Family Medicine

## 2022-08-09 DIAGNOSIS — F418 Other specified anxiety disorders: Secondary | ICD-10-CM

## 2022-08-29 ENCOUNTER — Other Ambulatory Visit: Payer: Self-pay | Admitting: Nurse Practitioner

## 2022-08-29 ENCOUNTER — Other Ambulatory Visit: Payer: Self-pay | Admitting: Family Medicine

## 2022-08-29 DIAGNOSIS — I10 Essential (primary) hypertension: Secondary | ICD-10-CM

## 2022-08-31 ENCOUNTER — Other Ambulatory Visit: Payer: Self-pay

## 2022-08-31 DIAGNOSIS — E876 Hypokalemia: Secondary | ICD-10-CM

## 2022-08-31 MED ORDER — POTASSIUM CHLORIDE CRYS ER 10 MEQ PO TBCR: 10.0000 meq | EXTENDED_RELEASE_TABLET | Freq: Every day | ORAL | 1 refills | Status: DC

## 2022-09-15 ENCOUNTER — Telehealth: Payer: Self-pay | Admitting: Internal Medicine

## 2022-09-15 NOTE — Telephone Encounter (Signed)
Patient left a message to schedule a follow up appt.  I returned her call but had to leave her a message asking her to call back so we could get her scheduled.

## 2022-10-03 NOTE — Progress Notes (Unsigned)
GI Office Note    Referring Provider: Tommie Sams, DO Primary Care Physician:  Tommie Sams, DO Primary Gastroenterologist: Hennie Duos. Krystal Lor, DO  Date:  10/04/2022  ID:  Krystal Silva, DOB 1955/08/10, MRN [id number]   Chief Complaint   Chief Complaint  Patient presents with   Follow-up    Patient here today for a follow up on her Diarrhea. Patient says she still is having issues with the diarrhea. She is having two to three episode of diarrhea per day, and takes Imodium once or twice per. Patient reflux is controlled on pantoprazole 40 mg once per day.   History of Present Illness  Krystal Silva is a 67 y.o. female with a history of GERD, chronic diarrhea, intermittent episodes of nausea and vomiting, multiple sclerosis, HTN, HLD, and diverticulitis presenting today for follow-up.  Colonoscopy March 2022: - Non bleeding internal hemorrhoids - Colonic diverticulosis - Random colonic biopsies with focal active colitis with main consideration of acute self-limiting infectious colitis versus NSAID induced colitis   EGD June 2022: -Gastritis s/p benign biopsy - Normal duodenum with benign biopsies.   Prior work-up for diarrhea: -Negative GI pathogen panel and C. Difficile x2 -Negative celiac serologies -TSH within normal limits.  Last office visit 04/04/2022.  GERD well-controlled with pantoprazole.  Had 2 bowel movements per day usually Imodium helps, sometimes taking it once to twice daily.  Still has some urgency.  Denies any nocturnal stools, abdominal pain, melena, BRBPR.  Has had some issues with low potassium.  Was having some episodic nausea but did note improvement with Zofran.  Labs 06/27/2022: Creatinine 1.12, GFR 55, normal LFTs.  Normal lipid panel.  Hemoglobin stable at 14.3.  Today: Diarrhea -having 2-3 episodes of diarrhea per day, taking Imodium once or twice per day. Some days she is home and does not need to take imodium but does take when she goes  out.  Last episode of vomiting in February/March but not as often. They have been coming out of the blue. Had several episodes in November/December. Can not pinpoint a particular food or occurrence. No nocturnal episodes. Zofran helps some  GERD - Taking pantoprazole 40 mg once daily.   Current Outpatient Medications  Medication Sig Dispense Refill   baclofen (LIORESAL) 10 MG tablet TAKE 1 TABLET (10 MG TOTAL) BY MOUTH AT BEDTIME. MAKE TAKE AN ADDITIONAL DOSE IF NEEDED 180 tablet 5   cetirizine (ZYRTEC) 10 MG tablet Take 10 mg by mouth daily as needed for allergies.     Cholecalciferol (VITAMIN D-3) 125 MCG (5000 UT) TABS Take 1 tablet by mouth daily. 90 tablet 1   diphenhydrAMINE (BENADRYL) 25 MG tablet Take 25 mg by mouth at bedtime as needed for allergies.     ibuprofen (ADVIL) 200 MG tablet Take 400 mg by mouth every 6 (six) hours as needed for headache or moderate pain.     imipramine (TOFRANIL) 25 MG tablet TAKE 1 TO 2 TABLETS BY MOUTH AT BEDTIME 180 tablet 1   Lidocaine (BLUE-EMU PAIN RELIEF DRY EX) Apply 1 application topically daily.     loperamide (IMODIUM) 2 MG capsule Take 2 mg by mouth as needed for diarrhea or loose stools.     losartan-hydrochlorothiazide (HYZAAR) 100-12.5 MG tablet TAKE 1 TABLET BY MOUTH EVERY DAY FOR BLOOD PRESSURE 90 tablet 0   ondansetron (ZOFRAN-ODT) 4 MG disintegrating tablet PLACE 1 TABLET (4 MG TOTAL) UNDER YOUR TONGUE EVERY 6-8 HOURS AS NEEDED FOR NAUSEA/VOMITING. 8 tablet 7  pantoprazole (PROTONIX) 40 MG tablet TAKE 1 TABLET (40 MG TOTAL) BY MOUTH TWICE A DAY BEFORE MEALS (Patient taking differently: Take 40 mg by mouth daily.) 180 tablet 1   potassium chloride (KLOR-CON M10) 10 MEQ tablet Take 1 tablet (10 mEq total) by mouth daily. 90 tablet 1   Probiotic Product (PROBIOTIC DAILY PO) Take 4 capsules by mouth daily.     sertraline (ZOLOFT) 100 MG tablet TAKE 1 TABLET BY MOUTH EVERY DAY 90 tablet 0   simvastatin (ZOCOR) 40 MG tablet TAKE 1 TABLET BY  MOUTH EVERYDAY AT BEDTIME 90 tablet 1   No current facility-administered medications for this visit.    Past Medical History:  Diagnosis Date   Allergy    Basal cell carcinoma 09/04/2019   right parotid area - Refer to [facility]   Diverticulitis    GERD (gastroesophageal reflux disease)    Hypercholesteremia    Hypertension    Insomnia    MS (multiple sclerosis) (HCC)    Vitamin D deficiency     Past Surgical History:  Procedure Laterality Date   BIOPSY  08/03/2020   Procedure: BIOPSY;  Surgeon: Lanelle Bal, DO;  Location: [facility];  Service: Endoscopy;;   BIOPSY  10/26/2020   Procedure: BIOPSY;  Surgeon: Lanelle Bal, DO;  Location: [facility];  Service: Endoscopy;;   COLONOSCOPY  08/21/2009   COLONOSCOPY WITH PROPOFOL N/A 08/03/2020    Surgeon: Lanelle Bal, DO;  internal hemorrhoids, diverticulosis in the sigmoid, descending, and transverse colon, otherwise normal exam.  Biopsies were taken to evaluate for microscopic colitis.  Recommended repeat colonoscopy in 10 years.    ESOPHAGOGASTRODUODENOSCOPY (EGD) WITH PROPOFOL N/A 10/26/2020   Surgeon: Lanelle Bal, DO; Gastritis s/p biopsy (negative for H. pylori), normal examined duodenum biopsied (benign), nonobstructing Schatzki's ring, not manipulated.   KNEE ARTHROSCOPY WITH MEDIAL MENISECTOMY Right 03/27/2015   Procedure: KNEE ARTHROSCOPY WITH PARTIAL MEDIAL MENISECTOMY AND MICROFRACTURE;  Surgeon: Vickki Hearing, MD;  Location: [facility];  Service: Orthopedics;  Laterality: Right;   TONSILLECTOMY     WISDOM TOOTH EXTRACTION  1980's    Family History  Problem Relation Age of Onset   Hypertension Mother    Hyperlipidemia Father    Hearing loss Father    Hyperlipidemia Brother    Diabetes Brother    Vision loss Maternal Grandmother    Vision loss Paternal Grandmother    Cancer Other        breast   Inflammatory bowel disease Neg Hx    Colon cancer Neg Hx    Ulcerative colitis Neg Hx     Crohn's disease Neg Hx     Allergies as of 10/04/2022 - Review Complete 10/04/2022  Allergen Reaction Noted   Cefzil [cefprozil] Diarrhea 03/31/2019   Codeine Nausea And Vomiting    Penicillins Rash 08/11/2009   Sulfonamide derivatives Rash     Social History   Socioeconomic History   Marital status: Divorced    Spouse name: Not on file   Number of children: Not on file   Years of education: 18   Highest education level: Not on file  Occupational History   Occupation: work   Tobacco Use   Smoking status: Former    Packs/day: 1.50    Years: 30.00    Additional pack years: 0.00    Total pack years: 45.00    Types: Cigarettes    Start date: 12/26/1974    Quit date: 06/22/2004    Years since  quitting: 18.2   Smokeless tobacco: Never  Vaping Use   Vaping Use: Never used  Substance and Sexual Activity   Alcohol use: Yes    Alcohol/week: 1.0 standard drink of alcohol    Types: 1 Cans of beer per week    Comment: rare social   Drug use: No   Sexual activity: Not Currently  Other Topics Concern   Not on file  Social History Narrative   Coffee in the morning couple of cups    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 Denies fatigue, weakness, weight loss.  CV: Denies chest pain, palpitations, syncope, peripheral edema, and claudication. Resp: Denies dyspnea at rest, cough, wheezing, coughing up blood, and pleurisy. GI: See HPI Derm: Denies rash, itching, dry skin Heme: Denies bruising, bleeding, and enlarged lymph nodes.  Physical Exam   BP 104/64 (BP Location: Left Arm, Patient Position: Sitting, Cuff Size: Large)   Pulse 77   Temp 97.8 F (36.6  C) (Temporal)   Ht 5' 4.5" (1.638 m)   Wt 168 lb 11.2 oz (76.5 kg)   BMI 28.51 kg/m   General:   Alert and oriented. No distress noted. Pleasant and cooperative.  Head:  Normocephalic and atraumatic. Eyes:  Conjuctiva clear without scleral icterus. Mouth:  Oral mucosa pink and moist. Good dentition. No lesions. Lungs:  Clear to auscultation bilaterally. No wheezes, rales, or rhonchi. No distress.  Heart:  S1, S2 present without murmurs appreciated.  Abdomen:  +BS, soft, non-tender and non-distended. No rebound or guarding. No HSM or masses noted. Rectal: deferred Msk:  Symmetrical without gross deformities. Normal posture. Extremities:  Without edema. Neurologic:  Alert and  oriented x4 Psych:  Alert and cooperative. Normal mood and affect.   Assessment  Krystal Silva is a 67 y.o. female with a history of GERD, chronic diarrhea, intermittent episodes of N/V, MS, HTN, HLD, and diverticulitis presenting today for follow-up.  GERD: Well-controlled on pantoprazole 40 mg once daily.  Does have some occasional intermittent episodes of nausea and vomiting as noted below.  Denies any dysphagia or lack of appetite.  Intermittent nausea/vomiting: Remains on pantoprazole 40 mg once daily.  Has been using Zofran as needed which does help some.  Notes she had multiple episodes in November/December and her last occurrence was in February/March.  Symptoms do not occur on a daily basis.  Denies any abdominal pain with this.  GERD biliary colic or uncontrolled acid reflux as cause.  Advised she may take an additional dose of pantoprazole if symptoms recur.  Discussed possible HIDA scan to assess for biliary etiology of nausea/vomiting and chronic diarrhea.  Chronic diarrhea: Prior workup has been negative for stool studies x 2, celiac, and thyroid dysfunction.  Colonoscopy in March 2022 with focal active colitis concerning for NSAID induced colitis versus infectious colitis.  No overt pathology revealing  microscopic colitis.  She continues to deny any abdominal pain related to her diarrhea.  Has tried avoiding lactose in the past without any significant relief. Symptoms are worsened postprandially.  Differentials include alpha gal, biliary colic, biliary dyskinesia, SIBO, EPI, and functional diarrhea.  We discussed  multiple options for testing today as well as empiric trial of other medications however she remains happy right now with her intermittent use of Imodium given that it works well even when she needs to leave the house to allow for meals.  I discussed with her that she may talk with insurance to see the cost of these test prior to proceeding but for sure if her symptoms worsen while using Imodium, there are options of medications first to try including cholestyramine, Xifaxan, or dicyclomine.  We discussed trialing Metamucil 2 teaspoons daily to see if this worsens her diarrhea.  PLAN   Other options for testing and treatment of diarrhea including HIDA scan, alpha gal, and pancreatic elastase discussed and offered, see AVS.  Continue pantoprazole 40 mg once daily. Continue Zofran as needed. Continue imodium 1-2 daily as needed.  Try metamucil 2 teaspoons daily.  Follow up in 6 months.     Brooke Bonito, MSN, FNP-BC, AGACNP-BC [facility]

## 2022-10-04 ENCOUNTER — Ambulatory Visit (INDEPENDENT_AMBULATORY_CARE_PROVIDER_SITE_OTHER): Payer: Commercial Managed Care - PPO | Admitting: Gastroenterology

## 2022-10-04 ENCOUNTER — Encounter: Payer: Self-pay | Admitting: Gastroenterology

## 2022-10-04 VITALS — BP 104/64 | HR 77 | Temp 97.8°F | Ht 64.5 in | Wt 168.7 lb

## 2022-10-04 DIAGNOSIS — R1115 Cyclical vomiting syndrome unrelated to migraine: Secondary | ICD-10-CM

## 2022-10-04 DIAGNOSIS — K219 Gastro-esophageal reflux disease without esophagitis: Secondary | ICD-10-CM | POA: Diagnosis not present

## 2022-10-04 DIAGNOSIS — R197 Diarrhea, unspecified: Secondary | ICD-10-CM | POA: Diagnosis not present

## 2022-10-04 NOTE — Patient Instructions (Addendum)
Continue pantoprazole 40 mg once daily.  At times when you are experiencing more severe nausea/vomiting, please take your Zofran and as soon as you have improvement if you are able to take an additional pantoprazole you may to see if this helps your stomach.  Continue Imodium once to twice daily as needed.  You may benefit from a trial of fiber to help with your diarrhea.  Try Metamucil (psyllium) 2 teaspoons daily, stop if you feel like this makes diarrhea worse.  To further evaluate cause of diarrhea, gallbladder: -HIDA scan as an option.  This is to assess the function of your gallbladder.  To assess for pancreatic etiology: -Pancreatic elastase (this is a stool test).  Other testing: -Alpha gal.  This is a blood test to see if there is an allergy to pork, lamb, beef or any of its byproducts.  Alpha gal can come from a tick bite and there is an allergy present this can cause diarrhea and sometimes abdominal pain.  -SIBO.  This is a breath test that we can refer you for to see if there is an issue with natural gut bacteria in the small bowel. (This can be treated with an antibiotic called Xifaxan).  Other treatment options: -Dicyclomine.  This is an antispasmodic that can help with diarrhea but usually works best for abdominal pain with diarrhea. -Cholestyramine.  This is a medication that helps bind the bile that is secreted by the gallbladder and can help firm up stools.  This is the powder form and sometimes pill form however it can affect the absorption of other medications so it has to be taken every hour after your normal medications or 4 to 6 hours prior.  We will plan to follow-up in 6 months, sooner if needed.  Please let me know after further investigation if you would like to proceed with any of these options or if your symptoms get worse.  It was a pleasure to see you today. I want to create trusting relationships with patients. If you receive a survey regarding your visit,  I  greatly appreciate you taking time to fill this out on paper or through your MyChart. I value your feedback.  Brooke Bonito, MSN, FNP-BC, AGACNP-BC [facility]

## 2022-10-09 ENCOUNTER — Other Ambulatory Visit: Payer: Self-pay | Admitting: Neurology

## 2022-10-09 ENCOUNTER — Other Ambulatory Visit: Payer: Self-pay | Admitting: Family Medicine

## 2022-10-09 DIAGNOSIS — E781 Pure hyperglyceridemia: Secondary | ICD-10-CM

## 2022-10-10 NOTE — Telephone Encounter (Signed)
Last seen on 03/30/22 per note "Continue baclofen for spasticity and sertraline and imipramine for mood '  Follow up scheduled on 04/05/23  Last filled on 07/12/22 #180 tablets (90 day supply)

## 2022-10-11 ENCOUNTER — Other Ambulatory Visit (HOSPITAL_COMMUNITY): Payer: Self-pay | Admitting: Family Medicine

## 2022-10-11 DIAGNOSIS — Z1231 Encounter for screening mammogram for malignant neoplasm of breast: Secondary | ICD-10-CM

## 2022-11-04 ENCOUNTER — Ambulatory Visit: Payer: Commercial Managed Care - PPO | Admitting: Family Medicine

## 2022-11-05 ENCOUNTER — Other Ambulatory Visit: Payer: Self-pay | Admitting: Family Medicine

## 2022-11-05 DIAGNOSIS — F418 Other specified anxiety disorders: Secondary | ICD-10-CM

## 2022-11-07 ENCOUNTER — Ambulatory Visit (HOSPITAL_COMMUNITY)
Admission: RE | Admit: 2022-11-07 | Discharge: 2022-11-07 | Disposition: A | Discharge status: Home / Self Care | Payer: Commercial Managed Care - PPO | Source: Ambulatory Visit | Attending: Family Medicine | Admitting: Family Medicine

## 2022-11-07 DIAGNOSIS — Z1231 Encounter for screening mammogram for malignant neoplasm of breast: Secondary | ICD-10-CM | POA: Diagnosis present

## 2022-11-11 ENCOUNTER — Encounter: Payer: Self-pay | Admitting: Family Medicine

## 2022-11-11 ENCOUNTER — Ambulatory Visit (INDEPENDENT_AMBULATORY_CARE_PROVIDER_SITE_OTHER): Payer: Commercial Managed Care - PPO | Admitting: Family Medicine

## 2022-11-11 VITALS — BP 120/75 | HR 73 | Temp 97.9°F | Ht 60.4 in | Wt 169.0 lb

## 2022-11-11 DIAGNOSIS — E785 Hyperlipidemia, unspecified: Secondary | ICD-10-CM

## 2022-11-11 DIAGNOSIS — I10 Essential (primary) hypertension: Secondary | ICD-10-CM | POA: Diagnosis not present

## 2022-11-11 DIAGNOSIS — G35 Multiple sclerosis: Secondary | ICD-10-CM | POA: Diagnosis not present

## 2022-11-11 DIAGNOSIS — R7989 Other specified abnormal findings of blood chemistry: Secondary | ICD-10-CM

## 2022-11-11 DIAGNOSIS — F418 Other specified anxiety disorders: Secondary | ICD-10-CM

## 2022-11-11 NOTE — Patient Instructions (Signed)
One lab test today.  Continue your medications.  Follow up in 6 months.  Take care  Dr. Adriana Simas

## 2022-11-11 NOTE — Assessment & Plan Note (Signed)
Stable on Zoloft. Continue. 

## 2022-11-11 NOTE — Progress Notes (Signed)
Subjective:  Patient ID: Krystal Silva, female    DOB: 22-Mar-1956  Age: 67 y.o. MRN: [id number]  CC: Chief Complaint  Patient presents with   Follow-up    HPI:  67 year old female with hypertension, multiple sclerosis, depression and anxiety, hyperlipidemia presents for follow-up.  Patient states that overall she is doing well.  She does note that she has some work-related stress.  She states that she has been having back pain as well.  She attributes this to picking weeds at home and a poor desk chair at work.  Also feels like stress is contributing.  She has been doing well in regards to her multiple sclerosis off medication and is optimistic that she will be released from the care of neurology soon.  Lipids have been stable on simvastatin.  Hypertension well-controlled losartan/HCTZ.  Depression and anxiety currently stable on Zoloft.  Patient Active Problem List   Diagnosis Date Noted   Dense breasts 12/05/2021   Hyperlipidemia 07/16/2021   Gastroesophageal reflux disease 10/12/2020   Restless leg syndrome 08/30/2017   Seasonal allergies 02/22/2017   Primary osteoarthritis of right knee    Essential hypertension, benign 05/27/2013   Multiple sclerosis (HCC) 03/06/2013   Depression with anxiety 11/02/2012    Social Hx   Social History   Socioeconomic History   Marital status: Divorced    Spouse name: Not on file   Number of children: Not on file   Years of education: 18   Highest education level: Master's degree (e.g., MA, MS, MEng, MEd, MSW, MBA)  Occupational History   Occupation: work   Tobacco Use   Smoking status: Former    Packs/day: 1.50    Years: 30.00    Additional pack years: 0.00    Total pack years: 45.00    Types: Cigarettes    Start date: 12/26/1974    Quit date: 06/22/2004    Years since quitting: 18.4   Smokeless tobacco: Never  Vaping Use   Vaping Use: Never used  Substance and Sexual Activity   Alcohol use: Yes    Alcohol/week: 1.0  standard drink of alcohol    Types: 1 Cans of beer per week    Comment: rare social   Drug use: No   Sexual activity: Not Currently  Other Topics Concern   Not on file  Social History Narrative   Coffee in the morning couple of cups    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[employer]   Social Determinants of Health   Financial Resource Strain: Low Risk  (11/09/2022)   Overall Financial Resource Strain (CARDIA)    Difficulty of Paying Living Expenses: Not hard at all  Food Insecurity: No Food Insecurity (11/09/2022)   Hunger Vital Sign    Worried About Running Out of Food in the Last Year: Never true    Ran Out of Food in the Last Year: Never true  Transportation Needs: No Transportation Needs (11/09/2022)   PRAPARE - Administrator, Civil Service (Medical): No    Lack of Transportation (Non-Medical): No  Physical Activity: Sufficiently Active (11/09/2022)   Exercise Vital Sign    Days of Exercise per Week: 5 days    Minutes of Exercise per Session: 30 min  Stress: Stress Concern Present (11/09/2022)   [employer] of Occupational Health - Occupational Stress Questionnaire    Feeling of Stress : To some extent  Social Connections: Socially Isolated (11/09/2022)   Social  Connection and Isolation Panel [NHANES]    Frequency of Communication with Friends and Family: Once a week    Frequency of Social Gatherings with Friends and Family: Once a week    Attends Religious Services: Never    Diplomatic Services operational officer: No    Attends Engineer, structural: Not on file    Marital Status: Divorced    Review of Systems Per HPI  Objective:  BP 120/75   Pulse 73   Temp 97.9 F (36.6 C)   Ht 5' 0.4" (1.534 m)   Wt 169 lb (76.7 kg)   SpO2 97%   BMI 32.57 kg/m      11/11/2022    8:19 AM 10/04/2022    8:22 AM 05/05/2022    8:43 AM  BP/Weight  Systolic BP 120 104 112  Diastolic BP 75 64 68  Wt. (Lbs) 169 168.7 170   BMI 32.57 kg/m2 28.51 kg/m2 29.18 kg/m2    Physical Exam Vitals and nursing note reviewed.  Constitutional:      General: She is not in acute distress.    Appearance: Normal appearance.  HENT:     Head: Normocephalic and atraumatic.  Eyes:     General:        Right eye: No discharge.        Left eye: No discharge.     Conjunctiva/sclera: Conjunctivae normal.  Cardiovascular:     Rate and Rhythm: Normal rate and regular rhythm.  Pulmonary:     Effort: Pulmonary effort is normal.     Breath sounds: Normal breath sounds. No wheezing, rhonchi or rales.  Neurological:     Mental Status: She is alert.  Psychiatric:        Mood and Affect: Mood normal.        Behavior: Behavior normal.     Lab Results  Component Value Date   WBC 5.8 06/27/2022   HGB 14.3 06/27/2022   HCT 42.0 06/27/2022   PLT 274 06/27/2022   GLUCOSE 101 (H) 06/27/2022   CHOL 155 06/27/2022   TRIG 99 06/27/2022   HDL 50 06/27/2022   LDLCALC 87 06/27/2022   ALT 15 06/27/2022   AST 12 06/27/2022   NA 144 06/27/2022   K 3.5 06/27/2022   CL 103 06/27/2022   CREATININE 1.12 (H) 06/27/2022   BUN 13 06/27/2022   CO2 25 06/27/2022   TSH 3.850 10/12/2020     Assessment & Plan:   Problem List Items Addressed This Visit       Cardiovascular and Mediastinum   Essential hypertension, benign - Primary (Chronic)    Well-controlled on losartan/HCTZ.  Metabolic panel today.        Nervous and Auditory   Multiple sclerosis (HCC) (Chronic)    Doing well at this time.        Other   Hyperlipidemia    Stable on simvastatin.  Continue.      Depression with anxiety    Stable on Zoloft.  Continue.      Other Visit Diagnoses     Elevated serum creatinine       Relevant Orders   Basic Metabolic Panel       Follow-up:  Return in about 6 months (around 05/13/2023).  Everlene Other DO [facility]

## 2022-11-11 NOTE — Assessment & Plan Note (Signed)
Doing well at this time  

## 2022-11-11 NOTE — Assessment & Plan Note (Signed)
Stable on simvastatin.  Continue. 

## 2022-11-11 NOTE — Assessment & Plan Note (Signed)
Well-controlled on losartan/HCTZ.  Metabolic panel today.

## 2022-11-12 ENCOUNTER — Other Ambulatory Visit: Payer: Self-pay | Admitting: Family Medicine

## 2022-11-12 LAB — BASIC METABOLIC PANEL
BUN/Creatinine Ratio: 17 (ref 12–28)
BUN: 15 mg/dL (ref 8–27)
CO2: 24 mmol/L (ref 20–29)
Calcium: 9.3 mg/dL (ref 8.7–10.3)
Chloride: 103 mmol/L (ref 96–106)
Creatinine, Ser: 0.9 mg/dL (ref 0.57–1.00)
Glucose: 92 mg/dL (ref 70–99)
Potassium: 3.1 mmol/L — ABNORMAL LOW (ref 3.5–5.2)
Sodium: 143 mmol/L (ref 134–144)
eGFR: 71 mL/min/{1.73_m2} (ref >59)

## 2022-11-12 MED ORDER — LOSARTAN POTASSIUM 100 MG PO TABS
100.0000 mg | ORAL_TABLET | Freq: Every day | ORAL | 3 refills | Status: DC
Start: 2022-11-12 — End: 2023-10-04

## 2022-11-26 ENCOUNTER — Other Ambulatory Visit: Payer: Self-pay | Admitting: Family Medicine

## 2022-11-26 DIAGNOSIS — I10 Essential (primary) hypertension: Secondary | ICD-10-CM

## 2022-12-08 ENCOUNTER — Encounter: Payer: Commercial Managed Care - PPO | Admitting: Nurse Practitioner

## 2022-12-12 ENCOUNTER — Other Ambulatory Visit: Payer: Self-pay

## 2022-12-12 MED ORDER — IMIPRAMINE HCL 25 MG PO TABS: 25.0000 mg | ORAL_TABLET | Freq: Every day | ORAL | 1 refills | Status: DC

## 2023-01-10 ENCOUNTER — Other Ambulatory Visit: Payer: Self-pay | Admitting: Gastroenterology

## 2023-01-10 DIAGNOSIS — K219 Gastro-esophageal reflux disease without esophagitis: Secondary | ICD-10-CM

## 2023-01-29 ENCOUNTER — Other Ambulatory Visit: Payer: Self-pay | Admitting: Family Medicine

## 2023-01-29 DIAGNOSIS — E781 Pure hyperglyceridemia: Secondary | ICD-10-CM

## 2023-01-29 DIAGNOSIS — E876 Hypokalemia: Secondary | ICD-10-CM

## 2023-02-10 ENCOUNTER — Encounter: Payer: Self-pay | Admitting: Nurse Practitioner

## 2023-02-10 ENCOUNTER — Ambulatory Visit: Payer: Commercial Managed Care - PPO | Admitting: Nurse Practitioner

## 2023-02-10 VITALS — BP 106/50 | HR 89 | Temp 97.0°F | Ht 60.4 in | Wt 173.0 lb

## 2023-02-10 DIAGNOSIS — Z13820 Encounter for screening for osteoporosis: Secondary | ICD-10-CM

## 2023-02-10 DIAGNOSIS — Z01411 Encounter for gynecological examination (general) (routine) with abnormal findings: Secondary | ICD-10-CM | POA: Diagnosis not present

## 2023-02-10 DIAGNOSIS — Z780 Asymptomatic menopausal state: Secondary | ICD-10-CM

## 2023-02-10 DIAGNOSIS — E876 Hypokalemia: Secondary | ICD-10-CM | POA: Diagnosis not present

## 2023-02-10 DIAGNOSIS — Z01419 Encounter for gynecological examination (general) (routine) without abnormal findings: Secondary | ICD-10-CM

## 2023-02-10 DIAGNOSIS — Z1283 Encounter for screening for malignant neoplasm of skin: Secondary | ICD-10-CM | POA: Insufficient documentation

## 2023-02-10 NOTE — Progress Notes (Unsigned)
Subjective:    Patient ID: Krystal Silva, female    DOB: 12/26/1955, 67 y.o.   MRN: [id number]  HPI The patient comes in today for a wellness visit.    A review of their health history was completed.  A review of medications was also completed.  Any needed refills; No  Eating habits: fair, eats at home mostly. Drinks green tea and soda, with some water.   Falls/  MVA accidents in past few months: no  Regular exercise: walks a lot at work and gardens at home.  Specialist pt sees on regular basis: gastro, neurology  Preventative health issues were discussed. Patient defers influenza and pneumonia immunizations at this time. Recommended bone density scan and eye exam. Up to date with dental exams, mammogram, and colonoscopy.   Additional concerns: She requested a referral for dermatology because of history of skin cancer and would like to be screened. Pt complains of bilateral knee pain, she has been seen by ortho for this.    Review of Systems  Constitutional:  Negative for activity change, appetite change, fatigue and fever.  HENT:  Negative for sore throat and trouble swallowing.   Respiratory:  Negative for cough, chest tightness, shortness of breath and wheezing.   Cardiovascular:  Negative for chest pain.  Gastrointestinal:  Positive for diarrhea. Negative for abdominal distention, abdominal pain, blood in stool, constipation, nausea and vomiting.       Chronic diarrhea, followed by gastro  Genitourinary:  Negative for difficulty urinating, dysuria, enuresis, frequency, genital sores, hematuria, pelvic pain, urgency, vaginal bleeding and vaginal discharge.      02/10/2023   11:00 AM  Depression screen PHQ 2/9  Decreased Interest 0  Down, Depressed, Hopeless 1  PHQ - 2 Score 1  Altered sleeping 0  Tired, decreased energy 1  Change in appetite 0  Feeling bad or failure about yourself  1  Trouble concentrating 1  Moving slowly or fidgety/restless 0  Suicidal thoughts  0  PHQ-9 Score 4  Difficult doing work/chores Somewhat difficult      02/10/2023   11:00 AM 11/11/2022    8:34 AM  GAD 7 : Generalized Anxiety Score  Nervous, Anxious, on Edge 1 0  Control/stop worrying 0 1  Worry too much - different things 0 1  Trouble relaxing 1 0  Restless 0 0  Easily annoyed or irritable 0 1  Afraid - awful might happen 1 0  Total GAD 7 Score 3 3  Anxiety Difficulty Somewhat difficult Not difficult at all         Objective:   Physical Exam Vitals and nursing note reviewed. Exam conducted with a chaperone present.  Constitutional:      General: She is not in acute distress.    Appearance: Normal appearance. She is well-developed.  Neck:     Thyroid: No thyromegaly.     Trachea: No tracheal deviation.     Comments: Thyroid non tender to palpation. No mass or goiter noted.  Cardiovascular:     Rate and Rhythm: Normal rate and regular rhythm.     Heart sounds: Normal heart sounds. No murmur heard. Pulmonary:     Effort: Pulmonary effort is normal.     Breath sounds: Normal breath sounds.  Chest:  Breasts:    Right: No swelling, inverted nipple, mass, skin change or tenderness.     Left: No swelling, inverted nipple, mass, skin change or tenderness.  Abdominal:     General: There is no  distension.     Palpations: Abdomen is soft. There is no mass.     Tenderness: There is no abdominal tenderness.     Hernia: No hernia is present.  Genitourinary:    General: Normal vulva.     Exam position: Lithotomy position.     Labia:        Right: No rash or lesion.        Left: No rash or lesion.      Vagina: No lesions.  Musculoskeletal:     Cervical back: Normal range of motion and neck supple.  Lymphadenopathy:     Cervical: No cervical adenopathy.     Upper Body:     Right upper body: No supraclavicular, axillary or pectoral adenopathy.     Left upper body: No supraclavicular, axillary or pectoral adenopathy.  Skin:    General: Skin is warm and  dry.     Comments: Mild sun damaged skin  Neurological:     Mental Status: She is alert and oriented to person, place, and time.  Psychiatric:        Mood and Affect: Mood normal.        Behavior: Behavior normal.        Thought Content: Thought content normal.        Judgment: Judgment normal.    Today's Vitals   02/10/23 1043  BP: (!) 106/50  Pulse: 89  Temp: (!) 97 F (36.1 C)  SpO2: 97%  Weight: 173 lb (78.5 kg)  Height: 5' 0.4" (1.534 m)   Body mass index is 33.34 kg/m.       Assessment & Plan:   1. Well woman exam - DG Bone Density -Educated on healthy, well-balanced diet, incorporating chair or water exercises bearable for her knee pain.   2. Hypokalemia - Serum potassium ordered   3. Skin cancer screening - Ambulatory referral to Dermatology  4. Post-menopausal - DG Bone Density  5. Screening for osteoporosis - DG Bone Density   History of hypokalemia, requesting potassium be checked. She is currently taking a potassium supplement.   Return in about 6 months (around 08/10/2023) for recheck with labs.

## 2023-02-10 NOTE — Patient Instructions (Addendum)
For vaginal dryness: Luvena and Replens

## 2023-02-11 LAB — POTASSIUM: Potassium: 3.9 mmol/L (ref 3.5–5.2)

## 2023-02-14 ENCOUNTER — Encounter: Payer: Self-pay | Admitting: Nurse Practitioner

## 2023-02-14 NOTE — Progress Notes (Signed)
Subjective:    Patient ID: Krystal Silva, female    DOB: 1955/08/01, 67 y.o.   MRN: [id number]  HPI    Review of Systems     Objective:   Physical Exam        Assessment & Plan:

## 2023-02-24 ENCOUNTER — Other Ambulatory Visit: Payer: Self-pay | Admitting: Nurse Practitioner

## 2023-02-24 ENCOUNTER — Encounter: Payer: Self-pay | Admitting: Nurse Practitioner

## 2023-02-24 MED ORDER — AZITHROMYCIN 250 MG PO TABS: ORAL_TABLET | ORAL | 0 refills | Status: DC

## 2023-04-05 ENCOUNTER — Ambulatory Visit (INDEPENDENT_AMBULATORY_CARE_PROVIDER_SITE_OTHER): Payer: Commercial Managed Care - PPO | Admitting: Neurology

## 2023-04-05 ENCOUNTER — Encounter: Payer: Self-pay | Admitting: Neurology

## 2023-04-05 VITALS — BP 151/80 | HR 71 | Ht 64.0 in | Wt 167.0 lb

## 2023-04-05 DIAGNOSIS — Z1283 Encounter for screening for malignant neoplasm of skin: Secondary | ICD-10-CM

## 2023-04-05 DIAGNOSIS — R5383 Other fatigue: Secondary | ICD-10-CM | POA: Diagnosis not present

## 2023-04-05 DIAGNOSIS — G2581 Restless legs syndrome: Secondary | ICD-10-CM | POA: Diagnosis not present

## 2023-04-05 DIAGNOSIS — R269 Unspecified abnormalities of gait and mobility: Secondary | ICD-10-CM

## 2023-04-05 DIAGNOSIS — G35 Multiple sclerosis: Secondary | ICD-10-CM | POA: Diagnosis not present

## 2023-04-05 NOTE — Progress Notes (Addendum)
[facility]  PATIENT: Krystal Silva DOB: 1955/12/28  REFERRING DOCTOR OR PCP:   Vinetta Bergamo SOURCE: patient, records from initial Neurology group and MRI images on PACS/CD  _________________________________     HISTORICAL  CHIEF COMPLAINT:  Chief Complaint  Patient presents with   Follow-up    Pt in room 11 alone. Here for MS follow up. Pt reports doing well. No concerns.    HISTORY OF PRESENT ILLNESS:  Krystal Silva is a 67 y.o.  woman with relapsing remitting MS diagnosed in 2006.  Update 04/05/2023: She stopped Avonex around March 2023 -- she had had no exacerbation or MRI change > 10 years while on Avonex.  Only 2 exacerbations were 2003 and 2006.   She has no definite new MS symptoms x years.          MRI of the brain 03/24/2021 showed multiple supratentorial WM lesions in pattern c/w MS (mostly periventricular radially oriented).  No new lesions and no change compared to 2019     Her gait is stable.   Her balance is slightly off but no falls   Knees bother gait more than MS.   She can go over a mile if knee pain is mild.  Legs are strong.  She has mild spasticity, helped by baclofen.  No new sensory changes.  She has mild dysesthesias in the limbs, helped by imipramine but she was able to cut back as symptoms are milder.     Bladder function is stable with mild frequency.   She has 1-2 times nocturia     Vision is mostly ok but cloudy if tired and longer time looking at monitor.     She notes less  fatigue, off the Avonex.  She is generally sleeping well.    She still does worse in the heat.     She is sleeping well.    She does not fall asleep in the evenings on weekdays but may on weekends.   She has some RLS at night, some nights worse than others and worse in a chair/sofa than in bed.   It has not worsened over last few years.  She never took gabapentin.     Mood is doing well.   She notes stress  at work. .  She is on sertraline.   She is working as a Data processing manager at [employer].    She has knee pain, worse in cold weather.   She had a basal cell carcinoma removed.   She was seeing dermatology regularly but not the last 2 years as her doctor   Vit D was low and she takes supplements       MS History:   In 2003, she had an episode of diplopia. An MRI was performed but she was not diagnosed with MS at that time. In 2006, in [place],  she presented with dysesthetic sensations in the legs, mostly around the shin. She had MRI's performed that were more consistent with MS. She also had a lumbar puncture and the CSF was consistent with MS. The CSF showed oligoclonal bands and IgG index of 1.37 (elevated). She started to see Dr. Gaynelle Adu and then Dr. Renne Crigler in 2010/2011 after moving to the area.    An MRI performed in 2011 was reportedly very similar to the one from 2006. She has been on Avonex therapy since diagnosis..  IMAGING: MRI Brain 04/23/2015 showed  T2/FLAIR hyperintense foci in the periventricular, deep and  juxtacortical white matter. The pattern is consistent with multiple sclerosis. There are no enhancing lesions. When compared to the MRI dated 08/12/2013, there is no definite change.  MRI brain 04/20/2022 showed Multiple T2/FLAIR hyperintense foci in the cerebral hemispheres in a pattern consistent with chronic demyelinating plaque associated with multiple sclerosis. None of the foci appear to be acute. They do not enhance. Compared to the MRI from 03/24/2021, there were no new lesions.   REVIEW OF SYSTEMS: Constitutional: No fevers, chills, sweats, or change in appetite Eyes: No visual changes, double vision, eye pain Ear, nose and throat: No hearing loss, ear pain, nasal congestion, sore throat Cardiovascular: No chest pain, palpitations Respiratory:  No shortness of breath at rest or with exertion.   No wheezes GastrointestinaI: No nausea, vomiting, diarrhea, abdominal pain,  fecal incontinence Genitourinary:  No dysuria, urinary retention or frequency.  No nocturia. Musculoskeletal:  No neck pain, back pain Integumentary: No rash, pruritus, skin lesions Neurological: as above Psychiatric: No depression at this time.  No anxiety Endocrine: No palpitations, diaphoresis, change in appetite, change in weigh or increased thirst Hematologic/Lymphatic:  No anemia, purpura, petechiae. Allergic/Immunologic: No itchy/runny eyes, nasal congestion, recent allergic reactions, rashes  ALLERGIES: Allergies  Allergen Reactions   Cefzil [Cefprozil] Diarrhea   Codeine Nausea And Vomiting   Penicillins Rash   Sulfonamide Derivatives Rash    HOME MEDICATIONS:  Current Outpatient Medications:    azithromycin (ZITHROMAX Z-PAK) 250 MG tablet, Take 2 tablets (500 mg) on  Day 1,  followed by 1 tablet (250 mg) once daily on Days 2 through 5., Disp: 6 each, Rfl: 0   baclofen (LIORESAL) 10 MG tablet, TAKE 1 TABLET (10 MG TOTAL) BY MOUTH AT BEDTIME. MAKE TAKE AN ADDITIONAL DOSE IF NEEDED, Disp: 180 tablet, Rfl: 5   cetirizine (ZYRTEC) 10 MG tablet, Take 10 mg by mouth daily as needed for allergies., Disp: , Rfl:    Cholecalciferol (VITAMIN D-3) 125 MCG (5000 UT) TABS, Take 1 tablet by mouth daily., Disp: 90 tablet, Rfl: 1   diphenhydrAMINE (BENADRYL) 25 MG tablet, Take 25 mg by mouth at bedtime as needed for allergies., Disp: , Rfl:    ibuprofen (ADVIL) 200 MG tablet, Take 400 mg by mouth every 6 (six) hours as needed for headache or moderate pain., Disp: , Rfl:    imipramine (TOFRANIL) 25 MG tablet, Take 1-2 tablets (25-50 mg total) by mouth at bedtime., Disp: 180 tablet, Rfl: 1   KLOR-CON M10 10 MEQ tablet, TAKE 1 TABLET BY MOUTH EVERY DAY, Disp: 90 tablet, Rfl: 1   Lidocaine (BLUE-EMU PAIN RELIEF DRY EX), Apply 1 application topically daily., Disp: , Rfl:    loperamide (IMODIUM) 2 MG capsule, Take 2 mg by mouth as needed for diarrhea or loose stools., Disp: , Rfl:    losartan  (COZAAR) 100 MG tablet, Take 1 tablet (100 mg total) by mouth daily., Disp: 90 tablet, Rfl: 3   ondansetron (ZOFRAN-ODT) 4 MG disintegrating tablet, PLACE 1 TABLET (4 MG TOTAL) UNDER YOUR TONGUE EVERY 6-8 HOURS AS NEEDED FOR NAUSEA/VOMITING., Disp: 8 tablet, Rfl: 7   pantoprazole (PROTONIX) 40 MG tablet, TAKE 1 TABLET (40 MG TOTAL) BY MOUTH TWICE A DAY BEFORE MEALS, Disp: 180 tablet, Rfl: 1   Probiotic Product (PROBIOTIC DAILY PO), Take 4 capsules by mouth daily., Disp: , Rfl:    sertraline (ZOLOFT) 100 MG tablet, TAKE 1 TABLET BY MOUTH EVERY DAY, Disp: 90 tablet, Rfl: 2   simvastatin (ZOCOR) 40 MG tablet, TAKE 1 TABLET  BY MOUTH EVERYDAY AT BEDTIME, Disp: 90 tablet, Rfl: 1  PAST MEDICAL HISTORY: Past Medical History:  Diagnosis Date   Allergy    Basal cell carcinoma 09/04/2019   right parotid area - Refer to [facility]   Diverticulitis    GERD (gastroesophageal reflux disease)    Hypercholesteremia    Hypertension    Insomnia    MS (multiple sclerosis) (HCC)    Vitamin D deficiency     PAST SURGICAL HISTORY: Past Surgical History:  Procedure Laterality Date   BIOPSY  08/03/2020   Procedure: BIOPSY;  Surgeon: Lanelle Bal, DO;  Location: [facility];  Service: Endoscopy;;   BIOPSY  10/26/2020   Procedure: BIOPSY;  Surgeon: Lanelle Bal, DO;  Location: [facility];  Service: Endoscopy;;   COLONOSCOPY  08/21/2009   COLONOSCOPY WITH PROPOFOL N/A 08/03/2020    Surgeon: Lanelle Bal, DO;  internal hemorrhoids, diverticulosis in the sigmoid, descending, and transverse colon, otherwise normal exam.  Biopsies were taken to evaluate for microscopic colitis.  Recommended repeat colonoscopy in 10 years.    ESOPHAGOGASTRODUODENOSCOPY (EGD) WITH PROPOFOL N/A 10/26/2020   Surgeon: Lanelle Bal, DO; Gastritis s/p biopsy (negative for H. pylori), normal examined duodenum biopsied (benign), nonobstructing Schatzki's ring, not manipulated.   KNEE ARTHROSCOPY WITH MEDIAL MENISECTOMY  Right 03/27/2015   Procedure: KNEE ARTHROSCOPY WITH PARTIAL MEDIAL MENISECTOMY AND MICROFRACTURE;  Surgeon: Vickki Hearing, MD;  Location: [facility];  Service: Orthopedics;  Laterality: Right;   TONSILLECTOMY     WISDOM TOOTH EXTRACTION  1980's    FAMILY HISTORY: Family History  Problem Relation Age of Onset   Hypertension Mother    Hyperlipidemia Father    Hearing loss Father    Hyperlipidemia Brother    Diabetes Brother    Vision loss Maternal Grandmother    Vision loss Paternal Grandmother    Cancer Other        breast   Inflammatory bowel disease Neg Hx    Colon cancer Neg Hx    Ulcerative colitis Neg Hx    Crohn's disease Neg Hx     SOCIAL HISTORY:  Social History   Socioeconomic History   Marital status: Divorced    Spouse name: Not on file   Number of children: Not on file   Years of education: 18   Highest education level: Master's degree (e.g., MA, MS, MEng, MEd, MSW, MBA)  Occupational History   Occupation: work   Tobacco Use   Smoking status: Former    Current packs/day: 0.00    Average packs/day: 1.5 packs/day for 30.0 years (45.0 ttl pk-yrs)    Types: Cigarettes    Start date: 12/26/1974    Quit date: 06/22/2004    Years since quitting: 18.7   Smokeless tobacco: Never  Vaping Use   Vaping status: Never Used  Substance and Sexual Activity   Alcohol use: Yes    Alcohol/week: 1.0 standard drink of alcohol    Types: 1 Cans of beer per week    Comment: rare social   Drug use: No   Sexual activity: Not Currently  Other Topics Concern   Not on file  Social History Narrative   Coffee in the morning couple of cups    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[employer]   Social Determinants of Health   Financial Resource Strain: Low Risk  (11/09/2022)   Overall Financial Resource Strain (CARDIA)    Difficulty of Paying  Living Expenses: Not hard at all  Food Insecurity: No Food Insecurity (11/09/2022)   Hunger Vital Sign     Worried About Running Out of Food in the Last Year: Never true    Ran Out of Food in the Last Year: Never true  Transportation Needs: No Transportation Needs (11/09/2022)   PRAPARE - Administrator, Civil Service (Medical): No    Lack of Transportation (Non-Medical): No  Physical Activity: Sufficiently Active (11/09/2022)   Exercise Vital Sign    Days of Exercise per Week: 5 days    Minutes of Exercise per Session: 30 min  Stress: Stress Concern Present (11/09/2022)   [employer] of Occupational Health - Occupational Stress Questionnaire    Feeling of Stress : To some extent  Social Connections: Socially Isolated (11/09/2022)   Social Connection and Isolation Panel [NHANES]    Frequency of Communication with Friends and Family: Once a week    Frequency of Social Gatherings with Friends and Family: Once a week    Attends Religious Services: Never    Database administrator or Organizations: No    Attends Engineer, structural: Not on file    Marital Status: Divorced  Intimate Partner Violence: Not on file     PHYSICAL EXAM  Vitals:   04/05/23 0956  BP: (!) 151/80  Pulse: 71  Weight: 167 lb (75.8 kg)  Height: 5\' 4"  (1.626 m)    Body mass index is 28.67 kg/m.   General: The patient is well-developed and well-nourished and in no acute distress  Neurologic Exam  Mental status: The patient is alert and oriented x 3 at the time of the examination. The patient has apparent normal recent and remote memory, with an apparently normal attention span and concentration ability.   Speech is normal.  Cranial nerves: Extraocular movements are full.  Her facial strength and sensation is normal.  The trapezius strength is normal.  Hearing is normal and symmetric.  Motor:  Muscle bulk is normal.   Tone is mildly increased in legs. Strength is  5 / 5 in all 4 extremities   Sensory: Sensory testing intact to touch and vibration in the arms and legs.  Coordination:  Finger-nose-finger and heel-to-shin is performed well.  Gait and station: Station is normal.   The gait is near normal  Tandem gait is poor.  Romberg is negative..   Reflexes: Deep tendon reflexes are symmetric and increased bilaterally with  2 beats non-sustained clonus at ankles.         Multiple sclerosis (HCC) - Plan: MR BRAIN W WO CONTRAST  Restless leg syndrome  Skin cancer screening  Other fatigue  Gait disturbance   1.  Remain off Avonex.  Check MRI of the brain to determine if she is having any subclinical progression.  If present, we would need to consider restarting Avonex or a different disease modifying therapy.   2.   Continue baclofen for spasticity and sertraline and imipramine for mood these have recently been refilled. 3.   If RLS worsens, consider gabpentin or ropinirole.   return in 6 months or sooner if there are new or worsening neurologic symptoms.  This visit is part of a comprehensive longitudinal care medical relationship regarding the patients primary diagnosis of MS and related concerns.   Nashiya Disbrow A. Epimenio Foot, MD, PhD 04/05/2023, 10:33 AM Certified in Neurology, Clinical Neurophysiology, Sleep Medicine, Pain Medicine and Neuroimaging  [facility] [address] [place], [place]  [address] [phone] 

## 2023-04-05 NOTE — Addendum Note (Signed)
Addended by: Despina Arias A on: 04/05/2023 11:16 AM   Modules accepted: Level of Service

## 2023-04-06 ENCOUNTER — Ambulatory Visit: Payer: Commercial Managed Care - PPO | Admitting: Gastroenterology

## 2023-04-06 ENCOUNTER — Encounter: Payer: Self-pay | Admitting: Gastroenterology

## 2023-04-06 VITALS — BP 138/75 | HR 72 | Temp 98.0°F | Ht 64.0 in | Wt 169.6 lb

## 2023-04-06 DIAGNOSIS — K219 Gastro-esophageal reflux disease without esophagitis: Secondary | ICD-10-CM | POA: Diagnosis not present

## 2023-04-06 DIAGNOSIS — R1115 Cyclical vomiting syndrome unrelated to migraine: Secondary | ICD-10-CM

## 2023-04-06 DIAGNOSIS — R112 Nausea with vomiting, unspecified: Secondary | ICD-10-CM | POA: Diagnosis not present

## 2023-04-06 DIAGNOSIS — K529 Noninfective gastroenteritis and colitis, unspecified: Secondary | ICD-10-CM | POA: Diagnosis not present

## 2023-04-06 MED ORDER — ONDANSETRON 4 MG PO TBDP
ORAL_TABLET | ORAL | 2 refills | Status: DC
Start: 2023-04-06 — End: 2023-11-13

## 2023-04-06 MED ORDER — PANTOPRAZOLE SODIUM 40 MG PO TBEC: 40.0000 mg | DELAYED_RELEASE_TABLET | Freq: Two times a day (BID) | ORAL | 2 refills | Status: DC

## 2023-04-06 NOTE — Progress Notes (Signed)
GI Office Note    Referring Provider: Tommie Sams, DO Primary Care Physician:  Tommie Sams, DO Primary Gastroenterologist: Hennie Duos. Marletta Lor, DO  Date:  04/06/2023  ID:  Jani Gravel, DOB Jul 24, 1955, MRN [id number]   Chief Complaint   Chief Complaint  Patient presents with   Follow-up    Follow up. Still having diarrhea and vomiting.     History of Present Illness  EARLEE STROUPE is a 67 y.o. female with a history of chronic diarrhea/IBS-D, GERD, MS, HTN, HLD, diverticulitis presenting today for follow-up of chronic diarrhea.  Colonoscopy March 2022: - Non bleeding internal hemorrhoids - Colonic diverticulosis - Random colonic biopsies with focal active colitis with main consideration of acute self-limiting infectious colitis versus NSAID induced colitis   EGD June 2022: -Gastritis s/p benign biopsy - Normal duodenum with benign biopsies.   Prior work-up for diarrhea: -Negative GI pathogen panel and C. Difficile x2 -Negative celiac serologies -TSH within normal limits.  Labs 06/27/2022: Creatinine 1.12, GFR 55, normal LFTs.  Normal lipid panel.  Hemoglobin stable at 14.3.   Last office visit 10/04/2022.  Having 2-3 episodes of diarrhea daily, takes Imodium 1-2 times per day especially going out but is staying home does not take it.  Had not had any vomiting since February/March but can come out of the blue at times.  Has several episodes in November/December and unable to pinpoint any specific dietary trigger.  Does use Zofran as needed.  GERD well-controlled with pantoprazole 40 mg once daily.  Discussed trying Metamucil 2 teaspoons daily, Imodium as needed, Zofran as needed, pantoprazole 40 mg once daily.  We discussed other testing and treatment options for diarrhea including HIDA scan, alpha gal, and pancreatic elastase.  Today:  Has bene having more vomiting over the last several months. Happening every week or every other week. Vomiting will last about 4-5 hours and  nausea lasts for about 24 hours and has to take the zofran. She does think it is stress. Diarrhea will worsen as well with it. Not explosive everyday.  Still going 2-3 times daily. Not able to do much of anything even around the house.   Takes imodium with work or when going out and usually will take once daily and will take additional if that does not work. Does have abdominal pain and bloating with it.   Couple weeks ago she started taking an extra dose here or there but not consistent.   No regular NSAID use. She did take naproxen once about 2 weeks ago but this is rare.   Has not tried any fiber supplementation.   Wt Readings from Last 3 Encounters:  04/06/23 169 lb 9.6 oz (76.9 kg)  04/05/23 167 lb (75.8 kg)  02/10/23 173 lb (78.5 kg)   Current Outpatient Medications  Medication Sig Dispense Refill   baclofen (LIORESAL) 10 MG tablet TAKE 1 TABLET (10 MG TOTAL) BY MOUTH AT BEDTIME. MAKE TAKE AN ADDITIONAL DOSE IF NEEDED 180 tablet 5   cetirizine (ZYRTEC) 10 MG tablet Take 10 mg by mouth daily as needed for allergies.     Cholecalciferol (VITAMIN D-3) 125 MCG (5000 UT) TABS Take 1 tablet by mouth daily. 90 tablet 1   diphenhydrAMINE (BENADRYL) 25 MG tablet Take 25 mg by mouth at bedtime as needed for allergies.     ibuprofen (ADVIL) 200 MG tablet Take 400 mg by mouth every 6 (six) hours as needed for headache or moderate pain.     imipramine (  TOFRANIL) 25 MG tablet Take 1-2 tablets (25-50 mg total) by mouth at bedtime. 180 tablet 1   KLOR-CON M10 10 MEQ tablet TAKE 1 TABLET BY MOUTH EVERY DAY 90 tablet 1   Lidocaine (BLUE-EMU PAIN RELIEF DRY EX) Apply 1 application topically daily.     loperamide (IMODIUM) 2 MG capsule Take 2 mg by mouth as needed for diarrhea or loose stools.     losartan (COZAAR) 100 MG tablet Take 1 tablet (100 mg total) by mouth daily. 90 tablet 3   ondansetron (ZOFRAN-ODT) 4 MG disintegrating tablet PLACE 1 TABLET (4 MG TOTAL) UNDER YOUR TONGUE EVERY 6-8 HOURS AS  NEEDED FOR NAUSEA/VOMITING. 8 tablet 7   pantoprazole (PROTONIX) 40 MG tablet TAKE 1 TABLET (40 MG TOTAL) BY MOUTH TWICE A DAY BEFORE MEALS 180 tablet 1   potassium chloride (KLOR-CON) 10 MEQ tablet Take by mouth.     Probiotic Product (PROBIOTIC DAILY PO) Take 4 capsules by mouth daily.     sertraline (ZOLOFT) 100 MG tablet TAKE 1 TABLET BY MOUTH EVERY DAY 90 tablet 2   simvastatin (ZOCOR) 40 MG tablet TAKE 1 TABLET BY MOUTH EVERYDAY AT BEDTIME 90 tablet 1   No current facility-administered medications for this visit.    Past Medical History:  Diagnosis Date   Allergy    Basal cell carcinoma 09/04/2019   right parotid area - Refer to [facility]   Diverticulitis    GERD (gastroesophageal reflux disease)    Hypercholesteremia    Hypertension    Insomnia    MS (multiple sclerosis) (HCC)    Vitamin D deficiency     Past Surgical History:  Procedure Laterality Date   BIOPSY  08/03/2020   Procedure: BIOPSY;  Surgeon: Lanelle Bal, DO;  Location: [facility];  Service: Endoscopy;;   BIOPSY  10/26/2020   Procedure: BIOPSY;  Surgeon: Lanelle Bal, DO;  Location: [facility];  Service: Endoscopy;;   COLONOSCOPY  08/21/2009   COLONOSCOPY WITH PROPOFOL N/A 08/03/2020    Surgeon: Lanelle Bal, DO;  internal hemorrhoids, diverticulosis in the sigmoid, descending, and transverse colon, otherwise normal exam.  Biopsies were taken to evaluate for microscopic colitis.  Recommended repeat colonoscopy in 10 years.    ESOPHAGOGASTRODUODENOSCOPY (EGD) WITH PROPOFOL N/A 10/26/2020   Surgeon: Lanelle Bal, DO; Gastritis s/p biopsy (negative for H. pylori), normal examined duodenum biopsied (benign), nonobstructing Schatzki's ring, not manipulated.   KNEE ARTHROSCOPY WITH MEDIAL MENISECTOMY Right 03/27/2015   Procedure: KNEE ARTHROSCOPY WITH PARTIAL MEDIAL MENISECTOMY AND MICROFRACTURE;  Surgeon: Vickki Hearing, MD;  Location: [facility];  Service: Orthopedics;  Laterality: Right;    TONSILLECTOMY     WISDOM TOOTH EXTRACTION  1980's    Family History  Problem Relation Age of Onset   Hypertension Mother    Hyperlipidemia Father    Hearing loss Father    Hyperlipidemia Brother    Diabetes Brother    Vision loss Maternal Grandmother    Vision loss Paternal Grandmother    Cancer Other        breast   Inflammatory bowel disease Neg Hx    Colon cancer Neg Hx    Ulcerative colitis Neg Hx    Crohn's disease Neg Hx     Allergies as of 04/06/2023 - Review Complete 04/06/2023  Allergen Reaction Noted   Cefzil [cefprozil] Diarrhea 03/31/2019   Codeine Nausea And Vomiting    Penicillins Rash 08/11/2009   Sulfonamide derivatives Rash     Social History  Socioeconomic History   Marital status: Divorced    Spouse name: Not on file   Number of children: Not on file   Years of education: 67   Highest education level: Master's degree (e.g., MA, MS, MEng, MEd, MSW, MBA)  Occupational History   Occupation: work   Tobacco Use   Smoking status: Former    Current packs/day: 0.00    Average packs/day: 1.5 packs/day for 30.0 years (45.0 ttl pk-yrs)    Types: Cigarettes    Start date: 12/26/1974    Quit date: 06/22/2004    Years since quitting: 18.8   Smokeless tobacco: Never  Vaping Use   Vaping status: Never Used  Substance and Sexual Activity   Alcohol use: Yes    Alcohol/week: 1.0 standard drink of alcohol    Types: 1 Cans of beer per week    Comment: rare social   Drug use: No   Sexual activity: Not Currently  Other Topics Concern   Not on file  Social History Narrative   Coffee in the morning couple of cups    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[employer]   Social Determinants of Health   Financial Resource Strain: Low Risk  (11/09/2022)   Overall Financial Resource Strain (CARDIA)    Difficulty of Paying Living Expenses: Not hard at all  Food Insecurity: No Food Insecurity (11/09/2022)   Hunger Vital Sign    Worried  About Running Out of Food in the Last Year: Never true    Ran Out of Food in the Last Year: Never true  Transportation Needs: No Transportation Needs (11/09/2022)   PRAPARE - Administrator, Civil Service (Medical): No    Lack of Transportation (Non-Medical): No  Physical Activity: Sufficiently Active (11/09/2022)   Exercise Vital Sign    Days of Exercise per Week: 5 days    Minutes of Exercise per Session: 30 min  Stress: Stress Concern Present (11/09/2022)   [employer] of Occupational Health - Occupational Stress Questionnaire    Feeling of Stress : To some extent  Social Connections: Socially Isolated (11/09/2022)   Social Connection and Isolation Panel [NHANES]    Frequency of Communication with Friends and Family: Once a week    Frequency of Social Gatherings with Friends and Family: Once a week    Attends Religious Services: Never    Database administrator or Organizations: No    Attends Engineer, structural: Not on file    Marital Status: Divorced     Review of Systems   Gen: Denies fever, chills, anorexia. Denies fatigue, weakness, weight loss.  CV: Denies chest pain, palpitations, syncope, peripheral edema, and claudication. Resp: Denies dyspnea at rest, cough, wheezing, coughing up blood, and pleurisy. GI: See HPI Derm: Denies rash, itching, dry skin Psych: Denies depression, anxiety, memory loss, confusion. No homicidal or suicidal ideation.  Heme: Denies bruising, bleeding, and enlarged lymph nodes.  Physical Exam   BP 138/75 (BP Location: Right Arm, Patient Position: Sitting, Cuff Size: Large)   Pulse 72   Temp 98 F (36.7 C) (Temporal)   Ht 5\' 4"  (1.626 m)   Wt 169 lb 9.6 oz (76.9 kg)   BMI 29.11 kg/m   General:   Alert and oriented. No distress noted. Pleasant and cooperative.  Head:  Normocephalic and atraumatic. Eyes:  Conjuctiva clear without scleral icterus. Mouth:  Oral mucosa pink and moist. Good dentition. No lesions.  Abdomen:  +BS, soft, non-tender and non-distended. No rebound or guarding. No HSM or masses noted. Rectal: deferred Msk:  Symmetrical without gross deformities. Normal posture. Extremities:  Without edema. Neurologic:  Alert and  oriented x4 Psych:  Alert and cooperative. Normal mood and affect.  Assessment  MEDIE SCHLENKER is a 67 y.o. female with a history of chronic diarrhea/IBS-D, GERD, MS, HTN, HLD, diverticulitis presenting today for follow-up of chronic diarrhea.  IBS-diarrhea: Chronic.  Has associated bloating as well as abdominal pain/cramping.  Colonoscopy in 2022 without microscopic colitis.  Has tried avoiding lactose in the past without any significant relief.  Unassessed differentials include alpha gal allergy and exocrine pancreatic insufficiency, we discussed these testing options today and provided additional written information today.  Given her history of cholecystectomy bile acid diarrhea and IBS/functional diarrhea remain potential causes.  We have discussed many other potential treatment options as well including colestipol, Xifaxan, and dicyclomine which I also provided additional written education today.  She will think on this and let me know what she decides.  For now she will continue using Imodium as needed.  Also advise low FODMAP diet  GERD: Typical symptoms appear to be well-controlled with pantoprazole 40 mg once daily.  No dysphagia.  Given nausea and vomiting as noted below we will increase PPI to BID.   N/V: Continues to have intermittent cycles of nausea and vomiting.  Nausea can last about 24 hours with vomiting lasting about 4-5 hours.  Unable to identify any specific food triggers.  Etiology unclear at this time however we will treat as possible uncontrolled acid reflux and will increase pantoprazole to 40 mg twice daily given her history of gastritis.  Last EGD in 2022.  If no improvement in increase of pantoprazole frequency then we may consider changing  formulation and then if no improvement consider a repeat upper endoscopy.  We also discussed other testing options including alpha gal also given her chronic diarrhea.  PLAN   Information provided on Xifaxin, colestipol, and dicyclomine Testing information provided (pancreatic elastase, and alpha gal) Increase pantoprazole to 40 mg twice daily.  Discussed potentially changing PPI formulations if no improvement with pantoprazole twice daily. Continue Zofran as needed. Refilled today.  Continue imodium as needed.  Could consider repeat EGD if continuing to have nausea/vomiting. Low FODMAP diet advised, additional information provided. Follow up in 4 months    Brooke Bonito, MSN, FNP-BC, AGACNP-BC [facility]

## 2023-04-06 NOTE — Patient Instructions (Addendum)
We will increase her pantoprazole to 40 mg twice daily.  Please try to be consistent with this, set a reminder on your phone if you need to or use a pill organizer.  I have updated your prescription.  I will also refill your Zofran for you to continue to use this as needed for recurrent nausea and vomiting.  Testing options in regards to diarrhea and nausea/vomiting: Alpha gal -this test for a tickborne illness that causes an allergy to beef, pork, and lamb.  This is known to cause diarrhea and nausea/vomiting symptoms. Pancreatic elastase -this is a test to assess for pancreatic insufficiency which is when your pancreas does not secrete enough of the enzymes that you need to breakdown food and absorb other nutrients.  Without these enzymes to breakdown food this can be a cause of diarrhea.  Treatment options for chronic diarrhea/IBS with diarrhea: Xifaxan -this is a an antibiotic that you take 3 times a day for 2 weeks to help reset your gut flora. Colestipol -this is a medication that is commonly used to treat high cholesterol but it also helps to bind the bile that your body produces that way if you are having an increase in bowel production it can bind to it and reduce diarrhea.  This is commonly used after having gallbladder removed and experiencing diarrhea Dicyclomine -this is an antispasmodic drug that helps to slow down movement in the GI tract.  It helps to reduce abdominal cramping and intern also can decrease diarrhea.  Common side effects are constipation, headaches, confusion, and others.  Additional information attached.  We will follow-up in 4 months, sooner if needed.  Please call me in the meantime if you decide you would like to proceed with any of this testing or trialing any other medication to help with the diarrhea.  It was a pleasure to see you today. I want to create trusting relationships with patients. If you receive a survey regarding your visit,  I greatly appreciate you  taking time to fill this out on paper or through your MyChart. I value your feedback.  Brooke Bonito, MSN, FNP-BC, AGACNP-BC [facility]

## 2023-04-09 ENCOUNTER — Other Ambulatory Visit: Payer: Self-pay | Admitting: Neurology

## 2023-04-10 NOTE — Telephone Encounter (Signed)
Last seen on 04/05/23 Follow up scheduled 04/04/24

## 2023-04-26 ENCOUNTER — Ambulatory Visit (INDEPENDENT_AMBULATORY_CARE_PROVIDER_SITE_OTHER): Payer: Commercial Managed Care - PPO

## 2023-04-26 DIAGNOSIS — G35 Multiple sclerosis: Secondary | ICD-10-CM | POA: Diagnosis not present

## 2023-04-26 MED ORDER — GADOBENATE DIMEGLUMINE 529 MG/ML IV SOLN
15.0000 mL | Freq: Once | INTRAVENOUS | Status: AC | PRN
Start: 2023-04-26 — End: 2023-04-26
  Administered 2023-04-26: 15 mL via INTRAVENOUS

## 2023-05-15 ENCOUNTER — Ambulatory Visit (INDEPENDENT_AMBULATORY_CARE_PROVIDER_SITE_OTHER): Payer: Commercial Managed Care - PPO | Admitting: Family Medicine

## 2023-05-15 DIAGNOSIS — I10 Essential (primary) hypertension: Secondary | ICD-10-CM | POA: Diagnosis not present

## 2023-05-15 DIAGNOSIS — Z130 Encounter for screening for diseases of the blood and blood-forming organs and certain disorders involving the immune mechanism: Secondary | ICD-10-CM | POA: Diagnosis not present

## 2023-05-15 DIAGNOSIS — R350 Frequency of micturition: Secondary | ICD-10-CM | POA: Diagnosis not present

## 2023-05-15 DIAGNOSIS — F418 Other specified anxiety disorders: Secondary | ICD-10-CM

## 2023-05-15 DIAGNOSIS — E785 Hyperlipidemia, unspecified: Secondary | ICD-10-CM | POA: Diagnosis not present

## 2023-05-15 LAB — POCT URINALYSIS DIP (CLINITEK)
Bilirubin, UA: NEGATIVE
Blood, UA: NEGATIVE
Glucose, UA: NEGATIVE mg/dL
Ketones, POC UA: NEGATIVE mg/dL
Leukocytes, UA: NEGATIVE
Nitrite, UA: NEGATIVE
Spec Grav, UA: 1.025 (ref 1.010–1.025)
Urobilinogen, UA: 0.2 U/dL
pH, UA: 6 (ref 5.0–8.0)

## 2023-05-15 NOTE — Progress Notes (Signed)
Subjective:  Patient ID: Krystal Silva, female    DOB: [birth year]  Age: 67 y.o. MRN: [id number]  CC: Follow-up   HPI:  67 year old female presents for follow-up.  BP was mildly elevated today and on repeat.  Previous diuretic stopped due to hypokalemia.  Patient currently on losartan 100 mg daily.  Patient reports ongoing urinary frequency.  Patient is concerned that she may have a urinary tract infection.  Lipids stable on simvastatin.  Needs recheck.  Planning for labs today.  Depression and anxiety stable on Zoloft.  Patient due for DEXA scan.  Patient Active Problem List   Diagnosis Date Noted   Frequent urination 05/15/2023   Dense breasts 12/05/2021   Hyperlipidemia 07/16/2021   Gastroesophageal reflux disease 10/12/2020   Restless leg syndrome 08/30/2017   Seasonal allergies 02/22/2017   Primary osteoarthritis of right knee    Essential hypertension, benign 05/27/2013   Multiple sclerosis (HCC) 03/06/2013   Hypokalemia 11/02/2012   Depression with anxiety 11/02/2012    Social Hx   Social History   Socioeconomic History   Marital status: Divorced    Spouse name: Not on file   Number of children: Not on file   Years of education: 18   Highest education level: Master's degree (e.g., MA, MS, MEng, MEd, MSW, MBA)  Occupational History   Occupation: work   Tobacco Use   Smoking status: Former    Current packs/day: 0.00    Average packs/day: 1.5 packs/day for 30.0 years (45.0 ttl pk-yrs)    Types: Cigarettes    Start date: 12/26/1974    Quit date: 06/22/2004    Years since quitting: 18.9   Smokeless tobacco: Never  Vaping Use   Vaping status: Never Used  Substance and Sexual Activity   Alcohol use: Yes    Alcohol/week: 1.0 standard drink of alcohol    Types: 1 Cans of beer per week    Comment: rare social   Drug use: No   Sexual activity: Not Currently  Other Topics Concern   Not on file  Social History Narrative   Coffee in the morning couple of cups     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[employer]   Social Drivers of Health   Financial Resource Strain: Low Risk  (05/12/2023)   Overall Financial Resource Strain (CARDIA)    Difficulty of Paying Living Expenses: Not very hard  Food Insecurity: No Food Insecurity (05/12/2023)   Hunger Vital Sign    Worried About Running Out of Food in the Last Year: Never true    Ran Out of Food in the Last Year: Never true  Transportation Needs: No Transportation Needs (05/12/2023)   PRAPARE - Administrator, Civil Service (Medical): No    Lack of Transportation (Non-Medical): No  Physical Activity: Insufficiently Active (05/12/2023)   Exercise Vital Sign    Days of Exercise per Week: 3 days    Minutes of Exercise per Session: 20 min  Stress: No Stress Concern Present (05/12/2023)   [employer] of Occupational Health - Occupational Stress Questionnaire    Feeling of Stress : Only a little  Social Connections: Socially Isolated (05/12/2023)   Social Connection and Isolation Panel [NHANES]    Frequency of Communication with Friends and Family: Once a week    Frequency of Social Gatherings with Friends and Family: Once a week    Attends Religious Services: Never    Production manager of [employer]  or Organizations: No    Attends Engineer, structural: Not on file    Marital Status: Divorced    Review of Systems Per HPI  Objective:  BP (!) 156/84   Pulse 76   Temp 97.9 F (36.6 C) (Oral)   Ht 5\' 4"  (1.626 m)   Wt 170 lb 6.4 oz (77.3 kg)   SpO2 97%   BMI 29.25 kg/m      05/15/2023    8:56 AM 05/15/2023    8:18 AM 04/06/2023    9:14 AM  BP/Weight  Systolic BP 156 146 138  Diastolic BP 84 76 75  Wt. (Lbs)  170.4 169.6  BMI  29.25 kg/m2 29.11 kg/m2    Physical Exam Vitals and nursing note reviewed.  Constitutional:      General: She is not in acute distress.    Appearance: Normal appearance.  HENT:     Head: Normocephalic and  atraumatic.     Right Ear: Tympanic membrane normal.     Left Ear: Tympanic membrane normal.  Eyes:     General:        Right eye: No discharge.        Left eye: No discharge.     Conjunctiva/sclera: Conjunctivae normal.  Cardiovascular:     Rate and Rhythm: Normal rate and regular rhythm.  Pulmonary:     Effort: Pulmonary effort is normal.     Breath sounds: Normal breath sounds. No wheezing, rhonchi or rales.  Neurological:     Mental Status: She is alert.  Psychiatric:        Mood and Affect: Mood normal.        Behavior: Behavior normal.     Lab Results  Component Value Date   WBC 5.8 06/27/2022   HGB 14.3 06/27/2022   HCT 42.0 06/27/2022   PLT 274 06/27/2022   GLUCOSE 92 11/11/2022   CHOL 155 06/27/2022   TRIG 99 06/27/2022   HDL 50 06/27/2022   LDLCALC 87 06/27/2022   ALT 15 06/27/2022   AST 12 06/27/2022   NA 143 11/11/2022   K 3.9 02/10/2023   CL 103 11/11/2022   CREATININE 0.90 11/11/2022   BUN 15 11/11/2022   CO2 24 11/11/2022   TSH 3.850 10/12/2020     Assessment & Plan:   Problem List Items Addressed This Visit       Cardiovascular and Mediastinum   Essential hypertension, benign (Chronic)   BP was mildly elevated here initially today.  Will continue to monitor.  Labs today.  Continue losartan.  If BP readings stay elevated will need to add additional pharmacotherapy.  Awaiting labs.      Relevant Orders   CMP14+EGFR   Microalbumin / creatinine urine ratio     Other   Depression with anxiety   Stable on Zoloft.  Continue.      Frequent urination   UA clear today.      Relevant Orders   POCT URINALYSIS DIP (CLINITEK) (Completed)   Hyperlipidemia   Stable on simvastatin.  Continue.  Lipid panel today.      Relevant Orders   Lipid panel   Other Visit Diagnoses       Screening for deficiency anemia       Relevant Orders   CBC      Follow-up:  Return in about 6 months (around 11/13/2023).  Everlene Other DO [facility]

## 2023-05-15 NOTE — Assessment & Plan Note (Signed)
Stable on Zoloft. Continue.

## 2023-05-15 NOTE — Assessment & Plan Note (Signed)
Stable on simvastatin.  Continue.  Lipid panel today.

## 2023-05-15 NOTE — Assessment & Plan Note (Signed)
BP was mildly elevated here initially today.  Will continue to monitor.  Labs today.  Continue losartan.  If BP readings stay elevated will need to add additional pharmacotherapy.  Awaiting labs.

## 2023-05-15 NOTE — Patient Instructions (Signed)
Call [phone] to schedule Bone Density.  Labs today.  Follow up in 6 months.  Take care  Dr. Adriana Simas

## 2023-05-15 NOTE — Assessment & Plan Note (Signed)
UA clear today. 

## 2023-05-16 LAB — CMP14+EGFR
ALT: 17 [IU]/L (ref 0–32)
AST: 18 [IU]/L (ref 0–40)
Albumin: 4.5 g/dL (ref 3.9–4.9)
Alkaline Phosphatase: 110 [IU]/L (ref 44–121)
BUN/Creatinine Ratio: 13 (ref 12–28)
BUN: 13 mg/dL (ref 8–27)
Bilirubin Total: 0.9 mg/dL (ref 0.0–1.2)
CO2: 24 mmol/L (ref 20–29)
Calcium: 9.4 mg/dL (ref 8.7–10.3)
Chloride: 104 mmol/L (ref 96–106)
Creatinine, Ser: 0.99 mg/dL (ref 0.57–1.00)
Globulin, Total: 2.5 g/dL (ref 1.5–4.5)
Glucose: 94 mg/dL (ref 70–99)
Potassium: 3.8 mmol/L (ref 3.5–5.2)
Sodium: 144 mmol/L (ref 134–144)
Total Protein: 7 g/dL (ref 6.0–8.5)
eGFR: 63 mL/min/{1.73_m2} (ref >59)

## 2023-05-16 LAB — MICROALBUMIN / CREATININE URINE RATIO
Creatinine, Urine: 175.2 mg/dL
Microalb/Creat Ratio: 8 mg/g{creat} (ref 0–29)
Microalbumin, Urine: 14.5 ug/mL

## 2023-05-16 LAB — CBC
Hematocrit: 40.8 % (ref 34.0–46.6)
Hemoglobin: 13.4 g/dL (ref 11.1–15.9)
MCH: 29.6 pg (ref 26.6–33.0)
MCHC: 32.8 g/dL (ref 31.5–35.7)
MCV: 90 fL (ref 79–97)
Platelets: 286 10*3/uL (ref 150–450)
RBC: 4.52 x10E6/uL (ref 3.77–5.28)
RDW: 13.1 % (ref 11.7–15.4)
WBC: 5.9 10*3/uL (ref 3.4–10.8)

## 2023-05-16 LAB — LIPID PANEL
Chol/HDL Ratio: 3.2 {ratio} (ref 0.0–4.4)
Cholesterol, Total: 146 mg/dL (ref 100–199)
HDL: 45 mg/dL (ref >39)
LDL Chol Calc (NIH): 71 mg/dL (ref 0–99)
Triglycerides: 178 mg/dL — ABNORMAL HIGH (ref 0–149)
VLDL Cholesterol Cal: 30 mg/dL (ref 5–40)

## 2023-07-07 ENCOUNTER — Other Ambulatory Visit: Payer: Self-pay | Admitting: Family Medicine

## 2023-07-07 DIAGNOSIS — F418 Other specified anxiety disorders: Secondary | ICD-10-CM

## 2023-07-07 DIAGNOSIS — E876 Hypokalemia: Secondary | ICD-10-CM

## 2023-07-10 ENCOUNTER — Other Ambulatory Visit: Payer: Self-pay | Admitting: Neurology

## 2023-07-31 ENCOUNTER — Encounter: Payer: Self-pay | Admitting: Internal Medicine

## 2023-08-15 ENCOUNTER — Ambulatory Visit (HOSPITAL_COMMUNITY)
Admission: RE | Admit: 2023-08-15 | Discharge: 2023-08-15 | Disposition: A | Discharge status: Home / Self Care | Source: Ambulatory Visit | Attending: Family Medicine | Admitting: Family Medicine

## 2023-08-15 ENCOUNTER — Encounter: Payer: Self-pay | Admitting: Family Medicine

## 2023-08-15 ENCOUNTER — Ambulatory Visit: Admitting: Family Medicine

## 2023-08-15 VITALS — BP 146/80 | HR 82 | Temp 98.2°F | Ht 64.0 in | Wt 165.0 lb

## 2023-08-15 DIAGNOSIS — R29818 Other symptoms and signs involving the nervous system: Secondary | ICD-10-CM

## 2023-08-15 DIAGNOSIS — M25572 Pain in left ankle and joints of left foot: Secondary | ICD-10-CM | POA: Insufficient documentation

## 2023-08-15 NOTE — Patient Instructions (Signed)
 Call [phone] to schedule bone density.  I will reach out to neurology.  Xray at the hospital.

## 2023-08-16 DIAGNOSIS — R29818 Other symptoms and signs involving the nervous system: Secondary | ICD-10-CM | POA: Insufficient documentation

## 2023-08-16 DIAGNOSIS — M25572 Pain in left ankle and joints of left foot: Secondary | ICD-10-CM | POA: Insufficient documentation

## 2023-08-16 NOTE — Progress Notes (Signed)
 Subjective:  Patient ID: Krystal Silva, female    DOB: [birth year]  Age: 68 y.o. MRN: [id number]  CC: Recent neurological symptoms and injury to the left   HPI:  68 year old female with the below mentioned medical problems presents for evaluation of the above.  Patient states that she has recently been under a great deal of stress.  On Saturday she went out and developed sudden onset left arm numbness and perioral numbness.  Patient states that she also felt like she was unable to speak.  No reports of facial droop or muscle weakness.  Lasted less than 5 minutes and she returned to her normal self.  Patient is very concerned about this.  She is concerned about the possibility of stroke and/or aneurysm.  Additionally, patient reports left ankle pain and swelling.  She states that she injured her ankle approximately 1 month ago when we had ice.  Then she subsequently injured it out in the yard approximately 3 weeks ago.  She still having swelling and pain.  This is located laterally.  Patient Active Problem List   Diagnosis Date Noted   Left ankle pain 08/16/2023   Transient neurological symptoms 08/16/2023   Dense breasts 12/05/2021   Hyperlipidemia 07/16/2021   Gastroesophageal reflux disease 10/12/2020   Restless leg syndrome 08/30/2017   Seasonal allergies 02/22/2017   Primary osteoarthritis of right knee    Essential hypertension, benign 05/27/2013   Multiple sclerosis (HCC) 03/06/2013   Depression with anxiety 11/02/2012    Social Hx   Social History   Socioeconomic History   Marital status: Divorced    Spouse name: Not on file   Number of children: Not on file   Years of education: 18   Highest education level: Master's degree (e.g., MA, MS, MEng, MEd, MSW, MBA)  Occupational History   Occupation: work   Tobacco Use   Smoking status: Former    Current packs/day: 0.00    Average packs/day: 1.5 packs/day for 30.0 years (45.0 ttl pk-yrs)    Types: Cigarettes    Start  date: 12/26/1974    Quit date: 06/22/2004    Years since quitting: 19.1   Smokeless tobacco: Never  Vaping Use   Vaping status: Never Used  Substance and Sexual Activity   Alcohol use: Yes    Alcohol/week: 1.0 standard drink of alcohol    Types: 1 Cans of beer per week    Comment: rare social   Drug use: No   Sexual activity: Not Currently  Other Topics Concern   Not on file  Social History Narrative   Coffee in the morning couple of cups    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[employer]   Social Drivers of Health   Financial Resource Strain: Low Risk  (05/12/2023)   Overall Financial Resource Strain (CARDIA)    Difficulty of Paying Living Expenses: Not very hard  Food Insecurity: No Food Insecurity (05/12/2023)   Hunger Vital Sign    Worried About Running Out of Food in the Last Year: Never true    Ran Out of Food in the Last Year: Never true  Transportation Needs: No Transportation Needs (05/12/2023)   PRAPARE - Administrator, Civil Service (Medical): No    Lack of Transportation (Non-Medical): No  Physical Activity: Insufficiently Active (05/12/2023)   Exercise Vital Sign    Days of Exercise per Week: 3 days    Minutes of Exercise per Session:  20 min  Stress: No Stress Concern Present (05/12/2023)   [employer] of Occupational Health - Occupational Stress Questionnaire    Feeling of Stress : Only a little  Social Connections: Socially Isolated (05/12/2023)   Social Connection and Isolation Panel [NHANES]    Frequency of Communication with Friends and Family: Once a week    Frequency of Social Gatherings with Friends and Family: Once a week    Attends Religious Services: Never    Diplomatic Services operational officer: No    Attends Engineer, structural: Not on file    Marital Status: Divorced    Review of Systems Per HPI  Objective:  BP (!) 146/80   Pulse 82   Temp 98.2 F (36.8 C)   Ht 5\' 4"  (1.626  m)   Wt 165 lb (74.8 kg)   SpO2 98%   BMI 28.32 kg/m      08/15/2023    3:58 PM 05/15/2023    8:56 AM 05/15/2023    8:18 AM  BP/Weight  Systolic BP 146 156 146  Diastolic BP 80 84 76  Wt. (Lbs) 165  170.4  BMI 28.32 kg/m2  29.25 kg/m2    Physical Exam  Lab Results  Component Value Date   WBC 5.9 05/15/2023   HGB 13.4 05/15/2023   HCT 40.8 05/15/2023   PLT 286 05/15/2023   GLUCOSE 94 05/15/2023   CHOL 146 05/15/2023   TRIG 178 (H) 05/15/2023   HDL 45 05/15/2023   LDLCALC 71 05/15/2023   ALT 17 05/15/2023   AST 18 05/15/2023   NA 144 05/15/2023   K 3.8 05/15/2023   CL 104 05/15/2023   CREATININE 0.99 05/15/2023   BUN 13 05/15/2023   CO2 24 05/15/2023   TSH 3.850 10/12/2020     Assessment & Plan:  Transient neurological symptoms Assessment & Plan: Etiology unclear.  TIA versus panic attack versus secondary to MS.  Patient has had a recent MRI.  I will reach out to her neurologist to further discuss.  She is stable currently.   Left ankle pain, unspecified chronicity Assessment & Plan: X-ray was obtained and revealed subacute distal fracture of the fibula.  Advised patient to go to [facility] for boot or splinting.  Orders: -     DG Ankle Complete Left   Follow-up:  Pending recommendations from Neuro  Aarin Sparkman DO [facility]

## 2023-08-16 NOTE — Assessment & Plan Note (Signed)
 Etiology unclear.  TIA versus panic attack versus secondary to MS.  Patient has had a recent MRI.  I will reach out to her neurologist to further discuss.  She is stable currently.

## 2023-08-16 NOTE — Assessment & Plan Note (Signed)
 X-ray was obtained and revealed subacute distal fracture of the fibula.  Advised patient to go to [facility] for boot or splinting.

## 2023-08-17 ENCOUNTER — Telehealth: Payer: Self-pay | Admitting: *Deleted

## 2023-08-17 ENCOUNTER — Encounter: Payer: Self-pay | Admitting: Family Medicine

## 2023-08-17 NOTE — Telephone Encounter (Signed)
 LVM for pt to call office.   Phone room: if she calls, please offer work in appt for 08/24/23 at 9am with Dr. Epimenio Foot. Appt on hold

## 2023-08-17 NOTE — Telephone Encounter (Signed)
-----   Message from Asa Lente sent at 08/17/2023  8:42 AM EDT ----- Regarding: schedule She had a possible TIA.  Please see if can get her on my schedule sometime in the next 2 weeks for a regular outpatient visit

## 2023-08-18 NOTE — Telephone Encounter (Signed)
 Pt called back and wanted to accept 4-3 but it is no longer available.  Pt is asking for a call from Kara Mead, RN to discuss other available slots.

## 2023-08-21 NOTE — Telephone Encounter (Signed)
 [facility] scheduled pt for 08/24/23 on 08/18/23.

## 2023-08-21 NOTE — Telephone Encounter (Signed)
 Called pt and LVM confirming appt date/time. Asked her to call back or reply to mychart if this appt does not work.

## 2023-08-24 ENCOUNTER — Encounter: Payer: Self-pay | Admitting: Neurology

## 2023-08-24 ENCOUNTER — Ambulatory Visit (INDEPENDENT_AMBULATORY_CARE_PROVIDER_SITE_OTHER): Admitting: Neurology

## 2023-08-24 VITALS — BP 140/85 | HR 81 | Ht 65.0 in | Wt 167.0 lb

## 2023-08-24 DIAGNOSIS — G35 Multiple sclerosis: Secondary | ICD-10-CM

## 2023-08-24 DIAGNOSIS — G2581 Restless legs syndrome: Secondary | ICD-10-CM

## 2023-08-24 DIAGNOSIS — R5383 Other fatigue: Secondary | ICD-10-CM | POA: Diagnosis not present

## 2023-08-24 DIAGNOSIS — R269 Unspecified abnormalities of gait and mobility: Secondary | ICD-10-CM | POA: Diagnosis not present

## 2023-08-24 DIAGNOSIS — R4701 Aphasia: Secondary | ICD-10-CM

## 2023-08-24 DIAGNOSIS — G459 Transient cerebral ischemic attack, unspecified: Secondary | ICD-10-CM | POA: Diagnosis not present

## 2023-08-24 DIAGNOSIS — R208 Other disturbances of skin sensation: Secondary | ICD-10-CM

## 2023-08-24 DIAGNOSIS — F418 Other specified anxiety disorders: Secondary | ICD-10-CM

## 2023-08-24 NOTE — Progress Notes (Signed)
 [facility]  PATIENT: Krystal Silva DOB: 1955/06/17  REFERRING DOCTOR OR PCP:   Vinetta Bergamo SOURCE: patient, records from initial Neurology group and MRI images on PACS/CD  _________________________________     HISTORICAL  CHIEF COMPLAINT:  Chief Complaint  Patient presents with   Follow-up    Pt in room 11.alone. MS follow up. Pt reports doing okay, pt said a couple weeks ago she had left arm numbness lasted for about 5-10 minutes. Numbness has stopped, no falls.     HISTORY OF PRESENT ILLNESS:  Krystal Silva is a 68 y.o.  woman with relapsing remitting MS diagnosed in 2006.  Update 04/05/2023: In late March, while shopping  the left arm felt like it was not there. There was no tingling in the arm.  Her left face felt swollen and she ha difficult getting 'good bye' out at the store.  .  This lasted 5-10 minutes .  She then felt baseline and drove home without issues. She has never had a TIA or CVA in the past.    She felt a little stressed after she worked 6 days that week.   The only other transient neuro symptoms was some flickering lights in vision for 5-10 minutes  6-7 years ago when BP meds were changed.  She started the bASA 81 mg.     Vascular risks:  HTN.  Former tobacco (< 1ppd x 20 yr).  HLD (on Zocor).  No DM, cardiac issues.    She is not on a DMT.  Last MRi showed no new lesions.  Knees bother her more than balance going up/down stairs and she uses the bannister.  She stopped Avonex around March 2023 -- she had had no exacerbation or MRI change > 10 years while on Avonex.  Only 2 exacerbations were 2003 and 2006.   She has no definite new MS symptoms x years.          Her gait is stable.   Her balance is slightly off but no falls   Knees bother gait more than MS.   She can go over a mile if knee pain is mild.  Legs are strong.  She has mild spasticity, helped by baclofen.  No new sensory changes.   She has mild dysesthesias in the limbs, helped by imipramine but she was able to cut back as symptoms are milder.     Bladder function is stable with mild frequency.   She has 1-2 times nocturia     Vision is mostly ok but cloudy if tired and longer time looking at monitor.     Fatigue is better off the Avonex.  She is generally sleeping well.    She still does worse in the heat.     She is sleeping well.    She does not fall asleep in the evenings on weekdays but may on weekends.   She has some RLS at night, some nights worse than others and worse in a chair/sofa than in bed.   It has not worsened over last few years.  She never took gabapentin.     Mood is doing well.   She notes stress at work. .  She is on sertraline.   She is working as a Data processing manager at [employer].    She has knee pain, worse in cold weather.   She had a basal cell carcinoma removed.   She was seeing dermatology regularly but not the  last 2 years as her doctor   Vit D was low and she takes supplements       MS History:   In 2003, she had an episode of diplopia. An MRI was performed but she was not diagnosed with MS at that time. In 2006, in [place],  she presented with dysesthetic sensations in the legs, mostly around the shin. She had MRI's performed that were more consistent with MS. She also had a lumbar puncture and the CSF was consistent with MS. The CSF showed oligoclonal bands and IgG index of 1.37 (elevated). She started to see Dr. Gaynelle Adu and then Dr. Renne Crigler in 2010/2011 after moving to the area.    An MRI performed in 2011 was reportedly very similar to the one from 2006. She has been on Avonex therapy since diagnosis..  IMAGING: MRI Brain 04/23/2015 showed  T2/FLAIR hyperintense foci in the periventricular, deep and juxtacortical white matter. The pattern is consistent with multiple sclerosis. There are no enhancing lesions. When compared to the MRI dated 08/12/2013, there is no definite change.  MRI brain  04/20/2022 showed Multiple T2/FLAIR hyperintense foci in the cerebral hemispheres in a pattern consistent with chronic demyelinating plaque associated with multiple sclerosis. None of the foci appear to be acute. They do not enhance. Compared to the MRI from 03/24/2021, there were no new lesions.   MRI brain 04/26/2023 showed Scattered T2/FLAIR hyperintense foci in the cerebral hemispheres, predominantly in the periventricular white matter, in a pattern consistent with chronic demyelinating plaque associated with multiple sclerosis. None of the foci appear to be acute. They do not enhance. Compared to the MRI from 04/20/2022, there were no new lesions.   REVIEW OF SYSTEMS: Constitutional: No fevers, chills, sweats, or change in appetite Eyes: No visual changes, double vision, eye pain Ear, nose and throat: No hearing loss, ear pain, nasal congestion, sore throat Cardiovascular: No chest pain, palpitations Respiratory:  No shortness of breath at rest or with exertion.   No wheezes GastrointestinaI: No nausea, vomiting, diarrhea, abdominal pain, fecal incontinence Genitourinary:  No dysuria, urinary retention or frequency.  No nocturia. Musculoskeletal:  No neck pain, back pain Integumentary: No rash, pruritus, skin lesions Neurological: as above Psychiatric: No depression at this time.  No anxiety Endocrine: No palpitations, diaphoresis, change in appetite, change in weigh or increased thirst Hematologic/Lymphatic:  No anemia, purpura, petechiae. Allergic/Immunologic: No itchy/runny eyes, nasal congestion, recent allergic reactions, rashes  ALLERGIES: Allergies  Allergen Reactions   Cefzil [Cefprozil] Diarrhea   Codeine Nausea And Vomiting   Penicillins Rash   Sulfonamide Derivatives Rash    HOME MEDICATIONS:  Current Outpatient Medications:    aspirin EC 81 MG tablet, Take 81 mg by mouth daily. Swallow whole., Disp: , Rfl:    baclofen (LIORESAL) 10 MG tablet, TAKE 1 TABLET (10 MG  TOTAL) BY MOUTH AT BEDTIME. MAKE TAKE AN ADDITIONAL DOSE IF NEEDED, Disp: 180 tablet, Rfl: 5   cetirizine (ZYRTEC) 10 MG tablet, Take 10 mg by mouth daily as needed for allergies., Disp: , Rfl:    Cholecalciferol (VITAMIN D-3) 125 MCG (5000 UT) TABS, Take 1 tablet by mouth daily., Disp: 90 tablet, Rfl: 1   diphenhydrAMINE (BENADRYL) 25 MG tablet, Take 25 mg by mouth at bedtime as needed for allergies., Disp: , Rfl:    ibuprofen (ADVIL) 200 MG tablet, Take 400 mg by mouth every 6 (six) hours as needed for headache or moderate pain., Disp: , Rfl:    imipramine (TOFRANIL) 25 MG tablet, TAKE  1 TO 2 TABLETS (25 TO 50 MG TOTAL) BY MOUTH AT BEDTIME, Disp: 180 tablet, Rfl: 1   KLOR-CON M10 10 MEQ tablet, TAKE 1 TABLET BY MOUTH EVERY DAY, Disp: 90 tablet, Rfl: 1   Lidocaine (BLUE-EMU PAIN RELIEF DRY EX), Apply 1 application topically daily., Disp: , Rfl:    loperamide (IMODIUM) 2 MG capsule, Take 2 mg by mouth as needed for diarrhea or loose stools., Disp: , Rfl:    losartan (COZAAR) 100 MG tablet, Take 1 tablet (100 mg total) by mouth daily., Disp: 90 tablet, Rfl: 3   ondansetron (ZOFRAN-ODT) 4 MG disintegrating tablet, Place 1 tablet (4 mg total) under your tongue every 6-8 hours as needed for nausea/vomiting., Disp: 20 tablet, Rfl: 2   pantoprazole (PROTONIX) 40 MG tablet, Take 1 tablet (40 mg total) by mouth 2 (two) times daily before a meal., Disp: 180 tablet, Rfl: 2   potassium chloride (KLOR-CON) 10 MEQ tablet, Take by mouth., Disp: , Rfl:    Probiotic Product (PROBIOTIC DAILY PO), Take 4 capsules by mouth daily., Disp: , Rfl:    sertraline (ZOLOFT) 100 MG tablet, TAKE 1 TABLET BY MOUTH EVERY DAY, Disp: 90 tablet, Rfl: 2   simvastatin (ZOCOR) 40 MG tablet, TAKE 1 TABLET BY MOUTH EVERYDAY AT BEDTIME, Disp: 90 tablet, Rfl: 1  PAST MEDICAL HISTORY: Past Medical History:  Diagnosis Date   Allergy    Basal cell carcinoma 09/04/2019   right parotid area - Refer to [facility]   Diverticulitis    GERD  (gastroesophageal reflux disease)    Hypercholesteremia    Hypertension    Insomnia    MS (multiple sclerosis) (HCC)    Vitamin D deficiency     PAST SURGICAL HISTORY: Past Surgical History:  Procedure Laterality Date   BIOPSY  08/03/2020   Procedure: BIOPSY;  Surgeon: Lanelle Bal, DO;  Location: [facility];  Service: Endoscopy;;   BIOPSY  10/26/2020   Procedure: BIOPSY;  Surgeon: Lanelle Bal, DO;  Location: [facility];  Service: Endoscopy;;   COLONOSCOPY  08/21/2009   COLONOSCOPY WITH PROPOFOL N/A 08/03/2020    Surgeon: Lanelle Bal, DO;  internal hemorrhoids, diverticulosis in the sigmoid, descending, and transverse colon, otherwise normal exam.  Biopsies were taken to evaluate for microscopic colitis.  Recommended repeat colonoscopy in 10 years.    ESOPHAGOGASTRODUODENOSCOPY (EGD) WITH PROPOFOL N/A 10/26/2020   Surgeon: Lanelle Bal, DO; Gastritis s/p biopsy (negative for H. pylori), normal examined duodenum biopsied (benign), nonobstructing Schatzki's ring, not manipulated.   KNEE ARTHROSCOPY WITH MEDIAL MENISECTOMY Right 03/27/2015   Procedure: KNEE ARTHROSCOPY WITH PARTIAL MEDIAL MENISECTOMY AND MICROFRACTURE;  Surgeon: Vickki Hearing, MD;  Location: [facility];  Service: Orthopedics;  Laterality: Right;   TONSILLECTOMY     WISDOM TOOTH EXTRACTION  1980's    FAMILY HISTORY: Family History  Problem Relation Age of Onset   Hypertension Mother    Hyperlipidemia Father    Hearing loss Father    Hyperlipidemia Brother    Diabetes Brother    Vision loss Maternal Grandmother    Vision loss Paternal Grandmother    Cancer Other        breast   Inflammatory bowel disease Neg Hx    Colon cancer Neg Hx    Ulcerative colitis Neg Hx    Crohn's disease Neg Hx     SOCIAL HISTORY:  Social History   Socioeconomic History   Marital status: Divorced    Spouse name: Not on file  Number of children: Not on file   Years of education: 18   Highest  education level: Master's degree (e.g., MA, MS, MEng, MEd, MSW, MBA)  Occupational History   Occupation: work   Tobacco Use   Smoking status: Former    Current packs/day: 0.00    Average packs/day: 1.5 packs/day for 30.0 years (45.0 ttl pk-yrs)    Types: Cigarettes    Start date: 12/26/1974    Quit date: 06/22/2004    Years since quitting: 19.1   Smokeless tobacco: Never  Vaping Use   Vaping status: Never Used  Substance and Sexual Activity   Alcohol use: Yes    Alcohol/week: 1.0 standard drink of alcohol    Types: 1 Cans of beer per week    Comment: rare social   Drug use: No   Sexual activity: Not Currently  Other Topics Concern   Not on file  Social History Narrative   Coffee in the morning couple of cups    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[employer]   Social Drivers of Health   Financial Resource Strain: Low Risk  (05/12/2023)   Overall Financial Resource Strain (CARDIA)    Difficulty of Paying Living Expenses: Not very hard  Food Insecurity: No Food Insecurity (05/12/2023)   Hunger Vital Sign    Worried About Running Out of Food in the Last Year: Never true    Ran Out of Food in the Last Year: Never true  Transportation Needs: No Transportation Needs (05/12/2023)   PRAPARE - Administrator, Civil Service (Medical): No    Lack of Transportation (Non-Medical): No  Physical Activity: Insufficiently Active (05/12/2023)   Exercise Vital Sign    Days of Exercise per Week: 3 days    Minutes of Exercise per Session: 20 min  Stress: No Stress Concern Present (05/12/2023)   [employer] of Occupational Health - Occupational Stress Questionnaire    Feeling of Stress : Only a little  Social Connections: Socially Isolated (05/12/2023)   Social Connection and Isolation Panel [NHANES]    Frequency of Communication with Friends and Family: Once a week    Frequency of Social Gatherings with Friends and Family: Once a week     Attends Religious Services: Never    Database administrator or Organizations: No    Attends Engineer, structural: Not on file    Marital Status: Divorced  Intimate Partner Violence: Not on file     PHYSICAL EXAM  Vitals:   08/24/23 0842 08/24/23 0846  BP: (!) 156/96 (!) 140/85  Pulse: 81   Weight: 167 lb (75.8 kg)   Height: 5\' 5"  (1.651 m)     Body mass index is 27.79 kg/m.   General: The patient is well-developed and well-nourished and in no acute distress  Neurologic Exam  Mental status: The patient is alert and oriented x 3 at the time of the examination. The patient has apparent normal recent and remote memory, with an apparently normal attention span and concentration ability.   Speech is normal.   No aphasia or word finding difficulty  Cranial nerves: Extraocular movements are full.  Her facial strength and sensation is normal.  The trapezius strength is normal.  Hearing is normal and symmetric.  Motor:  Muscle bulk is normal.   Tone is mildly increased in legs. Strength is  5 / 5 in all 4 extremities   Sensory: Sensory testing intact to  touch, temperature and vibration in the arms and legs.  Coordination: Finger-nose-finger and heel-to-shin is performed well.  Gait and station: Station is normal.   The gait is near normal  Tandem gait is poor.  Romberg is negative..   Reflexes: Deep tendon reflexes are symmetric and increased bilaterally with  2 beats non-sustained clonus at ankles.         Multiple sclerosis (HCC)  TIA (transient ischemic attack) - Plan: US Carotid Bilateral, ECHOCARDIOGRAM COMPLETE BUBBLE STUDY  Other fatigue  Gait disturbance  Depression with anxiety  Dysesthesia  Restless leg syndrome  Aphasia   1.  Remain off Avonex.  Last MRi no change.   Check MRI of the brain again in 2026 to see if having any subclinical progression.  If present, we would need to consider restarting Avonex or a different disease modifying therapy.    2.   TIA??   Will check carotid U/S and Echo with bubble contrast.  ASA 81 mg for now.   3.   Continue baclofen for spasticity and sertraline and imipramine for mood these have recently been refilled. 4   If RLS worsens, consider gabpentin or ropinirole.   return in 6 months or sooner if there are new or worsening neurologic symptoms.  This visit is part of a comprehensive longitudinal care medical relationship regarding the patients primary diagnosis of MS and related concerns.   Ivon Roedel A. Epimenio Foot, MD, PhD 08/24/2023, 9:19 AM Certified in Neurology, Clinical Neurophysiology, Sleep Medicine, Pain Medicine and Neuroimaging  [facility] [address], [address] [place], [place] [address] [phone]

## 2023-09-11 ENCOUNTER — Ambulatory Visit: Payer: Self-pay

## 2023-09-11 ENCOUNTER — Telehealth: Payer: Self-pay | Admitting: Neurology

## 2023-09-11 NOTE — Telephone Encounter (Signed)
 Appointment details confirmed

## 2023-09-11 NOTE — Telephone Encounter (Signed)
 Chief Complaint: Chest Tightness Symptoms: cough Frequency: x 1 week Pertinent Negatives: Patient denies fever, CP, HA Disposition: [] ED /[] Urgent Care (no appt availability in office) / [x] Appointment(In office/virtual)/ []  [facility]/ [] Home Care/ [] Refused Recommended Disposition /[] [facility]/ []  Follow-up with PCP Additional Notes: Pt reports she has been experiencing "bronchial chest tightness" cough, PND, sinus congestion/runny nose x1 week. Pt denies fever, CP, HA, vision changes. OV scheduled. This RN educated pt on home care, new-worsening symptoms, when to call back/seek emergent care. Pt verbalized understanding and agrees to plan.    Copied from CRM [id number]. Topic: Clinical - Red Word Triage >> Sep 11, 2023 11:03 AM Melissa C wrote: [facility] that prompted transfer to Nurse Triage: tightness in the chest area for the past couple of days Reason for Disposition  Earache  Answer Assessment - Initial Assessment Questions 1. LOCATION: "Where does it hurt?"      Chest tightness, sinus pressure 2. ONSET: "When did the sinus pain start?"  (e.g., hours, days)      X 1 week  5. NASAL CONGESTION: "Is the nose blocked?" If Yes, ask: "Can you open it or must you breathe through your mouth?"     Intermittenet  7. FEVER: "Do you have a fever?" If Yes, ask: "What is it, how was it measured, and when did it start?"      None 8. OTHER SYMPTOMS: "Do you have any other symptoms?" (e.g., sore throat, cough, earache, difficulty breathing)     Cough, PND, chest tightness  Protocols used: Sinus Pain or Congestion-A-AH

## 2023-09-12 ENCOUNTER — Encounter: Payer: Self-pay | Admitting: Family Medicine

## 2023-09-12 ENCOUNTER — Ambulatory Visit (HOSPITAL_COMMUNITY)
Admission: RE | Admit: 2023-09-12 | Discharge: 2023-09-12 | Disposition: A | Discharge status: Home / Self Care | Source: Ambulatory Visit | Attending: Family Medicine | Admitting: Family Medicine

## 2023-09-12 ENCOUNTER — Ambulatory Visit: Admitting: Family Medicine

## 2023-09-12 ENCOUNTER — Telehealth: Payer: Self-pay | Admitting: Neurology

## 2023-09-12 VITALS — BP 112/64 | HR 88 | Temp 96.8°F | Ht 65.0 in | Wt 167.0 lb

## 2023-09-12 DIAGNOSIS — R0602 Shortness of breath: Secondary | ICD-10-CM | POA: Diagnosis present

## 2023-09-12 DIAGNOSIS — G459 Transient cerebral ischemic attack, unspecified: Secondary | ICD-10-CM

## 2023-09-12 MED ORDER — FUROSEMIDE 20 MG PO TABS
20.0000 mg | ORAL_TABLET | Freq: Every day | ORAL | 3 refills | Status: DC
Start: 2023-09-12 — End: 2023-11-01

## 2023-09-12 MED ORDER — ALBUTEROL SULFATE HFA 108 (90 BASE) MCG/ACT IN AERS
2.0000 | INHALATION_SPRAY | Freq: Four times a day (QID) | RESPIRATORY_TRACT | 0 refills | Status: DC | PRN
Start: 2023-09-12 — End: 2023-09-12

## 2023-09-12 NOTE — Assessment & Plan Note (Addendum)
 Chest x-ray was obtained.  Chest x-ray was reviewed after visit was completed.  Independent interpretation: Pulmonary vascular congestion and bilateral pleural effusions.  This is suggestive of congestive heart failure (etiology and prognosis unclear at this time). I gave the patient a prescription for albuterol  prior to x-ray results.  Will discontinue.  Awaiting BNP.  I spoke with patient's neurologist as he recently ordered an echocardiogram.  This has not been done and he is okay with me ordering an urgent echocardiogram to evaluate for congestive heart failure.  Starting Lasix .  Metabolic panel later in the week to monitor potassium.

## 2023-09-12 NOTE — Progress Notes (Signed)
 Subjective:  Patient ID: Krystal Silva, female    DOB: [birth year]  Age: 68 y.o. MRN: [id number]  CC:   Chief Complaint  Patient presents with   chest tightness and sob     Going on for a week on and off with walking and talking and other activity    HPI:  68 year old female with hypertension, MS, hyperlipidemia presents for evaluation of the above.  1 week history of chest tightness and shortness of breath.  She states that she has been wheezing.  She reports shortness of breath with exertion.  She states that her respiratory symptoms or intermittent.  She states that when she climbs steps she is significantly short of breath.  She recently used a family member's inhaler with improvement.  No significant cough.  No fever.  No other complaints.  Patient Active Problem List   Diagnosis Date Noted   SOB (shortness of breath) 09/12/2023   Transient neurological symptoms 08/16/2023   Dense breasts 12/05/2021   Hyperlipidemia 07/16/2021   Gastroesophageal reflux disease 10/12/2020   Restless leg syndrome 08/30/2017   Seasonal allergies 02/22/2017   Primary osteoarthritis of right knee    Essential hypertension, benign 05/27/2013   Multiple sclerosis (HCC) 03/06/2013   Depression with anxiety 11/02/2012    Social Hx   Social History   Socioeconomic History   Marital status: Divorced    Spouse name: Not on file   Number of children: Not on file   Years of education: 18   Highest education level: Master's degree (e.g., MA, MS, MEng, MEd, MSW, MBA)  Occupational History   Occupation: work   Tobacco Use   Smoking status: Former    Current packs/day: 0.00    Average packs/day: 1.5 packs/day for 30.0 years (45.0 ttl pk-yrs)    Types: Cigarettes    Start date: 12/26/1974    Quit date: 06/22/2004    Years since quitting: 19.2   Smokeless tobacco: Never  Vaping Use   Vaping status: Never Used  Substance and Sexual Activity   Alcohol use: Yes    Alcohol/week: 1.0 standard drink  of alcohol    Types: 1 Cans of beer per week    Comment: rare social   Drug use: No   Sexual activity: Not Currently  Other Topics Concern   Not on file  Social History Narrative   Coffee in the morning couple of cups    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[employer]   Social Drivers of Health   Financial Resource Strain: Low Risk  (05/12/2023)   Overall Financial Resource Strain (CARDIA)    Difficulty of Paying Living Expenses: Not very hard  Food Insecurity: No Food Insecurity (05/12/2023)   Hunger Vital Sign    Worried About Running Out of Food in the Last Year: Never true    Ran Out of Food in the Last Year: Never true  Transportation Needs: No Transportation Needs (05/12/2023)   PRAPARE - Administrator, Civil Service (Medical): No    Lack of Transportation (Non-Medical): No  Physical Activity: Insufficiently Active (05/12/2023)   Exercise Vital Sign    Days of Exercise per Week: 3 days    Minutes of Exercise per Session: 20 min  Stress: No Stress Concern Present (05/12/2023)   [employer] of Occupational Health - Occupational Stress Questionnaire    Feeling of Stress : Only a little  Social Connections: Socially Isolated (05/12/2023)  Social Advertising account executive [NHANES]    Frequency of Communication with Friends and Family: Once a week    Frequency of Social Gatherings with Friends and Family: Once a week    Attends Religious Services: Never    Database administrator or Organizations: No    Attends Engineer, structural: Not on file    Marital Status: Divorced    Review of Systems Per HPI  Objective:  BP 112/64   Pulse 88   Temp (!) 96.8 F (36 C)   Ht 5\' 5"  (1.651 m)   Wt 167 lb (75.8 kg)   SpO2 98%   BMI 27.79 kg/m      09/12/2023    4:13 PM 08/24/2023    8:46 AM 08/24/2023    8:42 AM  BP/Weight  Systolic BP 112 140 156  Diastolic BP 64 85 96  Wt. (Lbs) 167  167  BMI 27.79 kg/m2   27.79 kg/m2    Physical Exam Vitals and nursing note reviewed.  Constitutional:      General: She is not in acute distress.    Appearance: Normal appearance.  HENT:     Head: Normocephalic and atraumatic.  Cardiovascular:     Rate and Rhythm: Normal rate and regular rhythm.  Pulmonary:     Effort: Pulmonary effort is normal.     Breath sounds: No wheezing or rales.  Neurological:     Mental Status: She is alert.  Psychiatric:        Mood and Affect: Mood normal.        Behavior: Behavior normal.     Lab Results  Component Value Date   WBC 5.9 05/15/2023   HGB 13.4 05/15/2023   HCT 40.8 05/15/2023   PLT 286 05/15/2023   GLUCOSE 94 05/15/2023   CHOL 146 05/15/2023   TRIG 178 (H) 05/15/2023   HDL 45 05/15/2023   LDLCALC 71 05/15/2023   ALT 17 05/15/2023   AST 18 05/15/2023   NA 144 05/15/2023   K 3.8 05/15/2023   CL 104 05/15/2023   CREATININE 0.99 05/15/2023   BUN 13 05/15/2023   CO2 24 05/15/2023   TSH 3.850 10/12/2020     Assessment & Plan:  SOB (shortness of breath) Assessment & Plan: Chest x-ray was obtained.  Chest x-ray was reviewed after visit was completed.  Independent interpretation: Pulmonary vascular congestion and bilateral pleural effusions.  This is suggestive of congestive heart failure (etiology and prognosis unclear at this time). I gave the patient a prescription for albuterol  prior to x-ray results.  Will discontinue.  Awaiting BNP.  I spoke with patient's neurologist as he recently ordered an echocardiogram.  This has not been done and he is okay with me ordering an urgent echocardiogram to evaluate for congestive heart failure.  Starting Lasix .  Metabolic panel later in the week to monitor potassium.  Orders: -     DG Chest 2 View -     Brain natriuretic peptide -     Basic metabolic panel with GFR -     ECHOCARDIOGRAM COMPLETE  Other orders -     Furosemide ; Take 1 tablet (20 mg total) by mouth daily.  Dispense: 30 tablet; Refill:  3    Follow-up:  Pending Echo  Kathleen Papa DO [facility]

## 2023-09-12 NOTE — Addendum Note (Signed)
 Addended by: Oneita Bihari on: 09/12/2023 08:40 AM   Modules accepted: Orders

## 2023-09-12 NOTE — Patient Instructions (Signed)
 Lab and Xray.  Albuterol  as directed.  We will call with results.

## 2023-09-12 NOTE — Telephone Encounter (Signed)
 New order placed

## 2023-09-12 NOTE — Telephone Encounter (Signed)
 Can you change the carotid order to VAS US  Carotid please

## 2023-09-13 ENCOUNTER — Other Ambulatory Visit: Payer: Self-pay | Admitting: Family Medicine

## 2023-09-13 DIAGNOSIS — R0602 Shortness of breath: Secondary | ICD-10-CM

## 2023-09-13 LAB — BRAIN NATRIURETIC PEPTIDE: BNP: 629.3 pg/mL — ABNORMAL HIGH (ref 0.0–100.0)

## 2023-09-14 ENCOUNTER — Encounter: Payer: Self-pay | Admitting: Family Medicine

## 2023-10-03 ENCOUNTER — Ambulatory Visit (HOSPITAL_COMMUNITY): Attending: Neurology

## 2023-10-03 ENCOUNTER — Other Ambulatory Visit: Payer: Self-pay | Admitting: Neurology

## 2023-10-03 ENCOUNTER — Ambulatory Visit: Payer: Self-pay | Admitting: Neurology

## 2023-10-03 DIAGNOSIS — G459 Transient cerebral ischemic attack, unspecified: Secondary | ICD-10-CM | POA: Insufficient documentation

## 2023-10-03 DIAGNOSIS — I501 Left ventricular failure: Secondary | ICD-10-CM

## 2023-10-03 DIAGNOSIS — R29818 Other symptoms and signs involving the nervous system: Secondary | ICD-10-CM

## 2023-10-03 LAB — ECHOCARDIOGRAM COMPLETE BUBBLE STUDY
Area-P 1/2: 5.62 cm2
Calc EF: 19.5 %
MV M vel: 4.97 m/s
MV Peak grad: 98.8 mmHg
Radius: 0.7 cm
S' Lateral: 4.6 cm
Single Plane A2C EF: 14.4 %
Single Plane A4C EF: 19.2 %

## 2023-10-03 MED ORDER — PERFLUTREN LIPID MICROSPHERE
1.0000 mL | INTRAVENOUS | Status: AC | PRN
Start: 2023-10-03 — End: 2023-10-03
  Administered 2023-10-03: 2 mL via INTRAVENOUS

## 2023-10-04 ENCOUNTER — Telehealth (HOSPITAL_COMMUNITY): Payer: Self-pay | Admitting: Cardiology

## 2023-10-04 ENCOUNTER — Other Ambulatory Visit: Payer: Self-pay | Admitting: Family Medicine

## 2023-10-04 ENCOUNTER — Ambulatory Visit: Attending: Cardiology

## 2023-10-04 ENCOUNTER — Ambulatory Visit: Payer: Self-pay | Admitting: Neurology

## 2023-10-04 DIAGNOSIS — G459 Transient cerebral ischemic attack, unspecified: Secondary | ICD-10-CM | POA: Diagnosis not present

## 2023-10-04 DIAGNOSIS — E781 Pure hyperglyceridemia: Secondary | ICD-10-CM

## 2023-10-04 NOTE — Telephone Encounter (Signed)
 Per Dr Julane Ny New HF needs to be seen this week    Attempted to contact pt No answer-unable to leave message VM full

## 2023-10-05 NOTE — Telephone Encounter (Signed)
 Pt returning call, Informed Pt that Nurse are busy and will callback

## 2023-10-05 NOTE — Telephone Encounter (Signed)
 Left message for patient to call.

## 2023-10-05 NOTE — Telephone Encounter (Signed)
-----   Message from Jorie Newness sent at 10/04/2023  9:06 PM EDT ----- Please let her know that the carotid Doppler was normal for age.  There was a little bit of narrowing but not enough to be concerned about

## 2023-10-05 NOTE — Progress Notes (Signed)
Called patient back and informed her of results.  

## 2023-10-05 NOTE — Telephone Encounter (Signed)
 Pt

## 2023-10-06 ENCOUNTER — Other Ambulatory Visit (HOSPITAL_COMMUNITY): Payer: Self-pay | Admitting: *Deleted

## 2023-10-06 ENCOUNTER — Ambulatory Visit (HOSPITAL_COMMUNITY)
Admission: RE | Admit: 2023-10-06 | Discharge: 2023-10-06 | Disposition: A | Discharge status: Home / Self Care | Source: Ambulatory Visit | Attending: Internal Medicine | Admitting: Internal Medicine

## 2023-10-06 VITALS — BP 118/82 | HR 64 | Wt 164.2 lb

## 2023-10-06 DIAGNOSIS — R0602 Shortness of breath: Secondary | ICD-10-CM

## 2023-10-06 DIAGNOSIS — Z87891 Personal history of nicotine dependence: Secondary | ICD-10-CM | POA: Diagnosis not present

## 2023-10-06 DIAGNOSIS — R079 Chest pain, unspecified: Secondary | ICD-10-CM | POA: Diagnosis not present

## 2023-10-06 DIAGNOSIS — I5021 Acute systolic (congestive) heart failure: Secondary | ICD-10-CM

## 2023-10-06 DIAGNOSIS — I110 Hypertensive heart disease with heart failure: Secondary | ICD-10-CM | POA: Insufficient documentation

## 2023-10-06 DIAGNOSIS — I10 Essential (primary) hypertension: Secondary | ICD-10-CM

## 2023-10-06 DIAGNOSIS — Z7982 Long term (current) use of aspirin: Secondary | ICD-10-CM | POA: Insufficient documentation

## 2023-10-06 DIAGNOSIS — Z79899 Other long term (current) drug therapy: Secondary | ICD-10-CM | POA: Insufficient documentation

## 2023-10-06 DIAGNOSIS — G35 Multiple sclerosis: Secondary | ICD-10-CM | POA: Insufficient documentation

## 2023-10-06 DIAGNOSIS — I5022 Chronic systolic (congestive) heart failure: Secondary | ICD-10-CM

## 2023-10-06 DIAGNOSIS — E785 Hyperlipidemia, unspecified: Secondary | ICD-10-CM | POA: Insufficient documentation

## 2023-10-06 LAB — CBC
HCT: 42.3 % (ref 36.0–46.0)
Hemoglobin: 14 g/dL (ref 12.0–15.0)
MCH: 29 pg (ref 26.0–34.0)
MCHC: 33.1 g/dL (ref 30.0–36.0)
MCV: 87.8 fL (ref 80.0–100.0)
Platelets: 259 10*3/uL (ref 150–400)
RBC: 4.82 MIL/uL (ref 3.87–5.11)
RDW: 12.8 % (ref 11.5–15.5)
WBC: 7 10*3/uL (ref 4.0–10.5)
nRBC: 0 % (ref 0.0–0.2)

## 2023-10-06 LAB — BASIC METABOLIC PANEL WITH GFR
Anion gap: 12 (ref 5–15)
BUN: 16 mg/dL (ref 8–23)
CO2: 29 mmol/L (ref 22–32)
Calcium: 9.7 mg/dL (ref 8.9–10.3)
Chloride: 104 mmol/L (ref 98–111)
Creatinine, Ser: 0.88 mg/dL (ref 0.44–1.00)
GFR, Estimated: >60 mL/min (ref >60)
Glucose, Bld: 96 mg/dL (ref 70–99)
Potassium: 3.5 mmol/L (ref 3.5–5.1)
Sodium: 145 mmol/L (ref 135–145)

## 2023-10-06 NOTE — Addendum Note (Signed)
 Encounter addended by: Glorietta Lark, RN on: 10/06/2023 12:35 PM  Actions taken: Clinical Note Signed, Charge Capture section accepted

## 2023-10-06 NOTE — Progress Notes (Signed)
 ReDS Vest / Clip - 10/06/23 1200       ReDS Vest / Clip   Station Marker A    Ruler Value 26    ReDS Value Range Low volume    ReDS Actual Value 30

## 2023-10-06 NOTE — Addendum Note (Signed)
 Encounter addended by: Dewey Fordyce, CMA on: 10/06/2023 12:42 PM  Actions taken: Visit diagnoses modified, Order list changed, Diagnosis association updated, Flowsheet accepted, Clinical Note Signed, Charge Capture section accepted

## 2023-10-06 NOTE — Patient Instructions (Addendum)
 Great to see you today!!!  Medication Changes:  START Spironolactone 12.5 mg (1/2 tab) Daily  START Metoprolol Succinate 25 mg Daily  Lab Work:  Labs done today, your results will be available in MyChart, we will contact you for abnormal readings.  Testing/Procedures:  Heart Catheterization on Monday 5/19, see instructions below  Special Instructions // Education:  Do the following things EVERYDAY: Weigh yourself in the morning before breakfast. Write it down and keep it in a log. Take your medicines as prescribed Eat low salt foods--Limit salt (sodium) to 2000 mg per day.  Stay as active as you can everyday Limit all fluids for the day to less than 2 liters   CATHETERIZATION INSTRUCTIONS:  You are scheduled for a Cardiac Catheterization on Monday, May 19 with Dr. Jules Oar.  1. Please arrive at the [facility] (Main Entrance A) at [facility]: [address] [place], [place] [address] at 5:30 AM (This time is 2 hour(s) before your procedure to ensure your preparation).   Free valet parking service is available. You will check in at ADMITTING. The support person will be asked to wait in the waiting room.  It is OK to have someone drop you off and come back when you are ready to be discharged.    Special note: Every effort is made to have your procedure done on time. Please understand that emergencies sometimes delay scheduled procedures.  2. Diet: Do not eat solid foods after midnight.  The patient may have clear liquids until 5am upon the day of the procedure.  3. Labs: DONE TODAY  4. Medication instructions in preparation for your procedure:   [facility] 5/19 DO NOT TAKE: Furosemide  or Spironolactone   On the morning of your procedure, take your Aspirin 81 mg and any morning medicines NOT listed above.  You may use sips of water.  5. Plan to go home the same day, you will only stay overnight if medically necessary. 6. Bring a current list of your  medications and current insurance cards. 7. You MUST have a responsible person to drive you home. 8. Someone MUST be with you the first 24 hours after you arrive home or your discharge will be delayed. 9. Please wear clothes that are easy to get on and off and wear slip-on shoes.  Thank you for allowing us  to care for you!   -- [place] [facility]   Follow-Up in: 3-4 weeks   At the [facility], you and your health needs are our priority. We have a designated team specialized in the treatment of Heart Failure. This Care Team includes your primary Heart Failure Specialized Cardiologist (physician), Advanced Practice Providers (APPs- Physician Assistants and Nurse Practitioners), and Pharmacist who all work together to provide you with the care you need, when you need it.   You may see any of the following providers on your designated Care Team at your next follow up:  Dr. Jules Oar Dr. Peder Bourdon Dr. Alwin Baars Dr. Judyth Nunnery Nieves Bars, NP Ruddy Corral, [place] [facility] [place], [place] Dennise Fitz, NP [place] Lee, NP Luster Salters, PharmD   Please be sure to bring in all your medications bottles to every appointment.   Need to Contact Us :  If you have any questions or concerns before your next appointment please send us  a message through [place] or call our office at [phone].    TO LEAVE A MESSAGE FOR THE NURSE SELECT OPTION 2, PLEASE  LEAVE A MESSAGE INCLUDING: YOUR NAME DATE OF BIRTH CALL BACK NUMBER REASON FOR CALL**this is important as we prioritize the call backs  YOU WILL RECEIVE A CALL BACK THE SAME DAY AS LONG AS YOU CALL BEFORE 4:00 PM

## 2023-10-06 NOTE — Progress Notes (Signed)
 ADV[facility]ED [facility] CLINIC CONSULT NOTE  Referring Physician: Cook, Jayce G, DO Primary Care: Cook, Jayce G, DO Primary Cardiologist: None  Chief Complaint: New onset HF  HPI:  Krystal Silva is 68 y/o woman with h/o HTN, Krystal, previous tobacco abuse (quit 2006 after 45 pys) and hyperlipidemia referred by Dr. Debrah Fan for new onset HF.   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 Lasted 1 minute then resolved. Saw Dr. Debrah Fan and was referred back to her Krystal neurologist. Carotid u/s <1-39% bilaterally. Had MRI 12/24 which was c/w her Krystal.   In March was at the beach and had episode of chest tightness and tried her brother's inhaler which helped. No CP since that time  Saw Dr. Debrah Fan on 4/22 for one week h/o SOB. BNP 629. Lasix  20mg  daily started and echo ordered.   Echo 10/03/23 EF 20-25% with global HK and moderate RV HK. Moderate MR  Says breathing is better since starting lasix . Can do ADLs without problem. If misses lasix  chest gets tight again. No arm pain. No bleeding.     Past Medical History:  Diagnosis Date   Allergy    Basal cell carcinoma 09/04/2019   right parotid area - Refer to [facility]   Diverticulitis    GERD (gastroesophageal reflux disease)    Hypercholesteremia    Hypertension    Insomnia    Krystal (multiple sclerosis) (HCC)    Vitamin D  deficiency     Current Outpatient Medications  Medication Sig Dispense Refill   aspirin EC 81 MG tablet Take 81 mg by mouth daily. Swallow whole.     baclofen  (LIORESAL ) 10 MG tablet TAKE 1 TABLET (10 MG TOTAL) BY MOUTH AT BEDTIME. MAKE TAKE AN ADDITIONAL DOSE IF NEEDED 180 tablet 5   cetirizine (ZYRTEC) 10 MG tablet Take 10 mg by mouth daily as needed for allergies.     Cholecalciferol (VITAMIN D -3) 125 MCG (5000 UT) TABS Take 1 tablet by mouth daily. 90 tablet 1   diphenhydrAMINE (BENADRYL) 25 MG tablet Take 25 mg by mouth at bedtime as needed  for allergies.     furosemide  (LASIX ) 20 MG tablet Take 1 tablet (20 mg total) by mouth daily. 30 tablet 3   ibuprofen  (ADVIL ) 200 MG tablet Take 400 mg by mouth every 6 (six) hours as needed for headache or moderate pain.     imipramine  (TOFRANIL ) 25 MG tablet TAKE 1 TO 2 TABLETS (25 TO 50 MG TOTAL) BY MOUTH AT BEDTIME 180 tablet 1   KLOR-CON  M10 10 MEQ tablet TAKE 1 TABLET BY MOUTH EVERY DAY 90 tablet 1   Lidocaine  (BLUE-EMU PAIN RELIEF DRY EX) Apply 1 application topically daily.     loperamide (IMODIUM) 2 MG capsule Take 2 mg by mouth as needed for diarrhea or loose stools.     losartan  (COZAAR ) 100 MG tablet TAKE 1 TABLET BY MOUTH EVERY DAY 90 tablet 1   ondansetron  (ZOFRAN -ODT) 4 MG disintegrating tablet Place 1 tablet (4 mg total) under your tongue every 6-8 hours as needed for nausea/vomiting. 20 tablet 2   pantoprazole  (PROTONIX ) 40 MG tablet Take 1 tablet (40 mg total) by mouth 2 (two) times daily before a meal. 180 tablet 2   Probiotic Product (PROBIOTIC DAILY PO) Take 4 capsules by mouth daily.     sertraline  (ZOLOFT ) 100 MG tablet TAKE 1 TABLET BY MOUTH EVERY DAY 90 tablet  2   simvastatin  (ZOCOR ) 40 MG tablet TAKE 1 TABLET BY MOUTH EVERYDAY AT BEDTIME 90 tablet 1   No current facility-administered medications for this encounter.    Allergies  Allergen Reactions   Cefzil  [Cefprozil ] Diarrhea   Codeine Nausea And Vomiting   Penicillins Rash   Sulfonamide Derivatives Rash      Social History   Socioeconomic History   Marital status: Divorced    Spouse name: Not on file   Number of children: Not on file   Years of education: 24   Highest education level: Master's degree (e.g., MA, Krystal, MEng, MEd, MSW, MBA)  Occupational History   Occupation: work   Tobacco Use   Smoking status: Former    Current packs/day: 0.00    Average packs/day: 1.5 packs/day for 30.0 years (45.0 ttl pk-yrs)    Types: Cigarettes    Start date: 12/26/1974    Quit date: 06/22/2004    Years since  quitting: 19.3   Smokeless tobacco: Never  Vaping Use   Vaping status: Never Used  Substance and Sexual Activity   Alcohol use: Yes    Alcohol/week: 1.0 standard drink of alcohol    Types: 1 Cans of beer per week    Comment: rare social   Drug use: No   Sexual activity: Not Currently  Other Topics Concern   Not on file  Social History Narrative   Coffee in the morning couple of cups    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[employer]   Social Drivers of Health   Financial Resource Strain: Low Risk  (05/12/2023)   Overall Financial Resource Strain (CARDIA)    Difficulty of Paying Living Expenses: Not very hard  Food Insecurity: No Food Insecurity (05/12/2023)   Hunger Vital Sign    Worried About Running Out of Food in the Last Year: Never true    Ran Out of Food in the Last Year: Never true  Transportation Needs: No Transportation Needs (05/12/2023)   PRAPARE - Administrator, Civil Service (Medical): No    Lack of Transportation (Non-Medical): No  Physical Activity: Insufficiently Active (05/12/2023)   Exercise Vital Sign    Days of Exercise per Week: 3 days    Minutes of Exercise per Session: 20 min  Stress: No Stress Concern Present (05/12/2023)   [employer] of Occupational Health - Occupational Stress Questionnaire    Feeling of Stress : Only a little  Social Connections: Socially Isolated (05/12/2023)   Social Connection and Isolation Panel [NHANES]    Frequency of Communication with Friends and Family: Once a week    Frequency of Social Gatherings with Friends and Family: Once a week    Attends Religious Services: Never    Database administrator or Organizations: No    Attends Engineer, structural: Not on file    Marital Status: Divorced  Catering manager Violence: Not on file      Family History  Problem Relation Age of Onset   Hypertension Mother    Hyperlipidemia Father    Hearing loss Father     Hyperlipidemia Brother    Diabetes Brother    Vision loss Maternal Grandmother    Vision loss Paternal Grandmother    Cancer Other        breast   Inflammatory bowel disease Neg Hx    Colon cancer Neg Hx    Ulcerative colitis Neg Hx    Crohn's  disease Neg Hx     Vitals:   10/06/23 1145  BP: 118/82  Pulse: 64  SpO2: 97%  Weight: 74.5 kg (164 lb 3.2 oz)    PHYSICAL EXAM: General:  Well appearing. No respiratory difficulty HEENT: normal Neck: supple. no JVD. Carotids 2+ bilat; no bruits. No lymphadenopathy or thryomegaly appreciated. Cor: PMI nondisplaced. Regular rate & rhythm. No rubs, gallops or murmurs. Lungs: clear Abdomen: soft, nontender, nondistended. No hepatosplenomegaly. No bruits or masses. Good bowel sounds. Extremities: no cyanosis, clubbing, rash, trace edema Neuro: alert & oriented x 3, cranial nerves grossly intact. moves all 4 extremities w/o difficulty. Affect pleasant.  ECG: NSR 71 No ST-T changes Personally reviewed    ASSESSMENT & PLAN:  1. New onset acute systolic HF - Echo 10/03/23 EF 32-44% with global HK and moderate RV HK. Moderate MR - History suggests potential ischemic CM but ECG and echo more c/w NICM - Volume status improved on lasix  20 daily. Will continue lasix  - Given history and CRFs will need R/L cath expediently - I have asked her to stay out of work until we further assess her cardiomyopathy - If symptoms progress will need to go to ER - Continue ASA/statin - Continue losartan  100 daily (eventual switch to Entresto as BP tolerates) - Start spiro 12.5 and Toprol 25 - SGLT2i after cath - If cath unrevealing will need cMRI  2. HTN  - BP well controlled - Meds as above  3. Multiple sclerosis - per Neurology  Jules Oar, MD  12:16 PM

## 2023-10-06 NOTE — Progress Notes (Signed)
 Educated patient on HF using "Living better with HF" booklet, AVS including cath instructions reviewed. All questions answered pt aware to call back for increased symptoms or questions. Sherolyn Dixon, RN, BSN, [facility] Specialty Coordinator [facility]

## 2023-10-06 NOTE — Addendum Note (Signed)
 Encounter addended by: Glorietta Lark, RN on: 10/06/2023 12:53 PM  Actions taken: Clinical Note Signed

## 2023-10-06 NOTE — H&P (View-Only) (Signed)
 ADVANCED HF CLINIC CONSULT NOTE  Referring Physician: Cook, Jayce G, DO Primary Care: Cook, Jayce G, DO Primary Cardiologist: None  Chief Complaint: New onset HF  HPI:  Krystal Silva is 68 y/o woman with h/o HTN, Krystal, previous tobacco abuse (quit 2006 after 45 pys) and hyperlipidemia referred by Dr. Debrah Fan for new onset HF.   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 Lasted 1 minute then resolved. Saw Dr. Debrah Fan and was referred back to her Krystal neurologist. Carotid u/s <1-39% bilaterally. Had MRI 12/24 which was c/w her Krystal.   In March was at the beach and had episode of chest tightness and tried her brother's inhaler which helped. No CP since that time  Saw Dr. Debrah Fan on 4/22 for one week h/o SOB. BNP 629. Lasix  20mg  daily started and echo ordered.   Echo 10/03/23 EF 20-25% with global HK and moderate RV HK. Moderate MR  Says breathing is better since starting lasix . Can do ADLs without problem. If misses lasix  chest gets tight again. No arm pain. No bleeding.     Past Medical History:  Diagnosis Date   Allergy    Basal cell carcinoma 09/04/2019   right parotid area - Refer to [facility]   Diverticulitis    GERD (gastroesophageal reflux disease)    Hypercholesteremia    Hypertension    Insomnia    Krystal (multiple sclerosis) (HCC)    Vitamin D  deficiency     Current Outpatient Medications  Medication Sig Dispense Refill   aspirin EC 81 MG tablet Take 81 mg by mouth daily. Swallow whole.     baclofen  (LIORESAL ) 10 MG tablet TAKE 1 TABLET (10 MG TOTAL) BY MOUTH AT BEDTIME. MAKE TAKE AN ADDITIONAL DOSE IF NEEDED 180 tablet 5   cetirizine (ZYRTEC) 10 MG tablet Take 10 mg by mouth daily as needed for allergies.     Cholecalciferol (VITAMIN D -3) 125 MCG (5000 UT) TABS Take 1 tablet by mouth daily. 90 tablet 1   diphenhydrAMINE (BENADRYL) 25 MG tablet Take 25 mg by mouth at bedtime as needed  for allergies.     furosemide  (LASIX ) 20 MG tablet Take 1 tablet (20 mg total) by mouth daily. 30 tablet 3   ibuprofen  (ADVIL ) 200 MG tablet Take 400 mg by mouth every 6 (six) hours as needed for headache or moderate pain.     imipramine  (TOFRANIL ) 25 MG tablet TAKE 1 TO 2 TABLETS (25 TO 50 MG TOTAL) BY MOUTH AT BEDTIME 180 tablet 1   KLOR-CON  M10 10 MEQ tablet TAKE 1 TABLET BY MOUTH EVERY DAY 90 tablet 1   Lidocaine  (BLUE-EMU PAIN RELIEF DRY EX) Apply 1 application topically daily.     loperamide (IMODIUM) 2 MG capsule Take 2 mg by mouth as needed for diarrhea or loose stools.     losartan  (COZAAR ) 100 MG tablet TAKE 1 TABLET BY MOUTH EVERY DAY 90 tablet 1   ondansetron  (ZOFRAN -ODT) 4 MG disintegrating tablet Place 1 tablet (4 mg total) under your tongue every 6-8 hours as needed for nausea/vomiting. 20 tablet 2   pantoprazole  (PROTONIX ) 40 MG tablet Take 1 tablet (40 mg total) by mouth 2 (two) times daily before a meal. 180 tablet 2   Probiotic Product (PROBIOTIC DAILY PO) Take 4 capsules by mouth daily.     sertraline  (ZOLOFT ) 100 MG tablet TAKE 1 TABLET BY MOUTH EVERY DAY 90 tablet  2   simvastatin  (ZOCOR ) 40 MG tablet TAKE 1 TABLET BY MOUTH EVERYDAY AT BEDTIME 90 tablet 1   No current facility-administered medications for this encounter.    Allergies  Allergen Reactions   Cefzil  [Cefprozil ] Diarrhea   Codeine Nausea And Vomiting   Penicillins Rash   Sulfonamide Derivatives Rash      Social History   Socioeconomic History   Marital status: Divorced    Spouse name: Not on file   Number of children: Not on file   Years of education: 24   Highest education level: Master's degree (e.g., MA, Krystal, MEng, MEd, MSW, MBA)  Occupational History   Occupation: work   Tobacco Use   Smoking status: Former    Current packs/day: 0.00    Average packs/day: 1.5 packs/day for 30.0 years (45.0 ttl pk-yrs)    Types: Cigarettes    Start date: 12/26/1974    Quit date: 06/22/2004    Years since  quitting: 19.3   Smokeless tobacco: Never  Vaping Use   Vaping status: Never Used  Substance and Sexual Activity   Alcohol use: Yes    Alcohol/week: 1.0 standard drink of alcohol    Types: 1 Cans of beer per week    Comment: rare social   Drug use: No   Sexual activity: Not Currently  Other Topics Concern   Not on file  Social History Narrative   Coffee in the morning couple of cups    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[employer]   Social Drivers of Health   Financial Resource Strain: Low Risk  (05/12/2023)   Overall Financial Resource Strain (CARDIA)    Difficulty of Paying Living Expenses: Not very hard  Food Insecurity: No Food Insecurity (05/12/2023)   Hunger Vital Sign    Worried About Running Out of Food in the Last Year: Never true    Ran Out of Food in the Last Year: Never true  Transportation Needs: No Transportation Needs (05/12/2023)   PRAPARE - Administrator, Civil Service (Medical): No    Lack of Transportation (Non-Medical): No  Physical Activity: Insufficiently Active (05/12/2023)   Exercise Vital Sign    Days of Exercise per Week: 3 days    Minutes of Exercise per Session: 20 min  Stress: No Stress Concern Present (05/12/2023)   [employer] of Occupational Health - Occupational Stress Questionnaire    Feeling of Stress : Only a little  Social Connections: Socially Isolated (05/12/2023)   Social Connection and Isolation Panel [NHANES]    Frequency of Communication with Friends and Family: Once a week    Frequency of Social Gatherings with Friends and Family: Once a week    Attends Religious Services: Never    Database administrator or Organizations: No    Attends Engineer, structural: Not on file    Marital Status: Divorced  Catering manager Violence: Not on file      Family History  Problem Relation Age of Onset   Hypertension Mother    Hyperlipidemia Father    Hearing loss Father     Hyperlipidemia Brother    Diabetes Brother    Vision loss Maternal Grandmother    Vision loss Paternal Grandmother    Cancer Other        breast   Inflammatory bowel disease Neg Hx    Colon cancer Neg Hx    Ulcerative colitis Neg Hx    Crohn's  disease Neg Hx     Vitals:   10/06/23 1145  BP: 118/82  Pulse: 64  SpO2: 97%  Weight: 74.5 kg (164 lb 3.2 oz)    PHYSICAL EXAM: General:  Well appearing. No respiratory difficulty HEENT: normal Neck: supple. no JVD. Carotids 2+ bilat; no bruits. No lymphadenopathy or thryomegaly appreciated. Cor: PMI nondisplaced. Regular rate & rhythm. No rubs, gallops or murmurs. Lungs: clear Abdomen: soft, nontender, nondistended. No hepatosplenomegaly. No bruits or masses. Good bowel sounds. Extremities: no cyanosis, clubbing, rash, trace edema Neuro: alert & oriented x 3, cranial nerves grossly intact. moves all 4 extremities w/o difficulty. Affect pleasant.  ECG: NSR 71 No ST-T changes Personally reviewed    ASSESSMENT & PLAN:  1. New onset acute systolic HF - Echo 10/03/23 EF 32-44% with global HK and moderate RV HK. Moderate MR - History suggests potential ischemic CM but ECG and echo more c/w NICM - Volume status improved on lasix  20 daily. Will continue lasix  - Given history and CRFs will need R/L cath expediently - I have asked her to stay out of work until we further assess her cardiomyopathy - If symptoms progress will need to go to ER - Continue ASA/statin - Continue losartan  100 daily (eventual switch to Entresto as BP tolerates) - Start spiro 12.5 and Toprol 25 - SGLT2i after cath - If cath unrevealing will need cMRI  2. HTN  - BP well controlled - Meds as above  3. Multiple sclerosis - per Neurology  Krystal Oar, MD  12:16 PM

## 2023-10-07 ENCOUNTER — Telehealth: Payer: Self-pay | Admitting: Student

## 2023-10-07 ENCOUNTER — Telehealth: Payer: Self-pay | Admitting: Internal Medicine

## 2023-10-07 NOTE — Telephone Encounter (Signed)
   Patient called Answering Service requesting a medication be sent into her pharmacy. I tried to call patient twice for more details but was unable to reach her. Left a message asking her to call back.  Anniebell Bedore E Skarleth Delmonico, PA-C 10/07/2023 5:42 PM

## 2023-10-07 NOTE — Telephone Encounter (Signed)
 Patient Krystal Silva contacted the cardiology after hours line regarding a prescription that was needed prior to a reported pending procedure.  Attempted call to patient but unable to reach.  I left a discrete voicemail message.

## 2023-10-09 ENCOUNTER — Encounter (HOSPITAL_COMMUNITY): Payer: Self-pay | Admitting: Internal Medicine

## 2023-10-09 ENCOUNTER — Ambulatory Visit (HOSPITAL_COMMUNITY): Payer: Self-pay | Admitting: Internal Medicine

## 2023-10-09 ENCOUNTER — Other Ambulatory Visit: Payer: Self-pay

## 2023-10-09 ENCOUNTER — Encounter (HOSPITAL_COMMUNITY)
Admission: AD | Disposition: A | Discharge status: Home / Self Care | Payer: Self-pay | Source: Ambulatory Visit | Attending: Internal Medicine

## 2023-10-09 ENCOUNTER — Ambulatory Visit (HOSPITAL_COMMUNITY)
Admission: AD | Admit: 2023-10-09 | Discharge: 2023-10-09 | Disposition: A | Discharge status: Home / Self Care | Source: Ambulatory Visit | Attending: Internal Medicine | Admitting: Internal Medicine

## 2023-10-09 DIAGNOSIS — I5022 Chronic systolic (congestive) heart failure: Secondary | ICD-10-CM | POA: Diagnosis not present

## 2023-10-09 DIAGNOSIS — G35 Multiple sclerosis: Secondary | ICD-10-CM | POA: Insufficient documentation

## 2023-10-09 DIAGNOSIS — Z79899 Other long term (current) drug therapy: Secondary | ICD-10-CM | POA: Insufficient documentation

## 2023-10-09 DIAGNOSIS — I428 Other cardiomyopathies: Secondary | ICD-10-CM | POA: Diagnosis not present

## 2023-10-09 DIAGNOSIS — Z87891 Personal history of nicotine dependence: Secondary | ICD-10-CM | POA: Insufficient documentation

## 2023-10-09 DIAGNOSIS — E785 Hyperlipidemia, unspecified: Secondary | ICD-10-CM | POA: Diagnosis not present

## 2023-10-09 DIAGNOSIS — R079 Chest pain, unspecified: Secondary | ICD-10-CM

## 2023-10-09 DIAGNOSIS — I5021 Acute systolic (congestive) heart failure: Secondary | ICD-10-CM

## 2023-10-09 DIAGNOSIS — I110 Hypertensive heart disease with heart failure: Secondary | ICD-10-CM | POA: Insufficient documentation

## 2023-10-09 DIAGNOSIS — E876 Hypokalemia: Secondary | ICD-10-CM

## 2023-10-09 HISTORY — PX: RIGHT/LEFT HEART CATH AND CORONARY ANGIOGRAPHY: CATH118266

## 2023-10-09 LAB — POCT I-STAT EG7
Acid-Base Excess: 0 mmol/L (ref 0.0–2.0)
Acid-base deficit: 2 mmol/L (ref 0.0–2.0)
Bicarbonate: 24.3 mmol/L (ref 20.0–28.0)
Bicarbonate: 26.1 mmol/L (ref 20.0–28.0)
Calcium, Ion: 1.08 mmol/L — ABNORMAL LOW (ref 1.15–1.40)
Calcium, Ion: 1.22 mmol/L (ref 1.15–1.40)
HCT: 37 % (ref 36.0–46.0)
HCT: 39 % (ref 36.0–46.0)
Hemoglobin: 12.6 g/dL (ref 12.0–15.0)
Hemoglobin: 13.3 g/dL (ref 12.0–15.0)
O2 Saturation: 75 %
O2 Saturation: 76 %
Potassium: 2.7 mmol/L — CL (ref 3.5–5.1)
Potassium: 2.9 mmol/L — ABNORMAL LOW (ref 3.5–5.1)
Sodium: 143 mmol/L (ref 135–145)
Sodium: 143 mmol/L (ref 135–145)
TCO2: 26 mmol/L (ref 22–32)
TCO2: 28 mmol/L (ref 22–32)
pCO2, Ven: 44.6 mmHg (ref 44–60)
pCO2, Ven: 46.5 mmHg (ref 44–60)
pH, Ven: 7.344 (ref 7.25–7.43)
pH, Ven: 7.358 (ref 7.25–7.43)
pO2, Ven: 43 mmHg (ref 32–45)
pO2, Ven: 43 mmHg (ref 32–45)

## 2023-10-09 LAB — POCT I-STAT 7, (LYTES, BLD GAS, ICA,H+H)
Acid-base deficit: 2 mmol/L (ref 0.0–2.0)
Bicarbonate: 22.4 mmol/L (ref 20.0–28.0)
Calcium, Ion: 1.05 mmol/L — ABNORMAL LOW (ref 1.15–1.40)
HCT: 36 % (ref 36.0–46.0)
Hemoglobin: 12.2 g/dL (ref 12.0–15.0)
O2 Saturation: 99 %
Potassium: 2.6 mmol/L — CL (ref 3.5–5.1)
Sodium: 144 mmol/L (ref 135–145)
TCO2: 23 mmol/L (ref 22–32)
pCO2 arterial: 35.6 mmHg (ref 32–48)
pH, Arterial: 7.406 (ref 7.35–7.45)
pO2, Arterial: 125 mmHg — ABNORMAL HIGH (ref 83–108)

## 2023-10-09 SURGERY — RIGHT/LEFT HEART CATH AND CORONARY ANGIOGRAPHY
Anesthesia: LOCAL

## 2023-10-09 MED ORDER — SODIUM CHLORIDE 0.9% FLUSH: 3.0000 mL | Freq: Two times a day (BID) | INTRAVENOUS | Status: DC

## 2023-10-09 MED ORDER — FENTANYL CITRATE (PF) 100 MCG/2ML IJ SOLN
INTRAMUSCULAR | Status: AC
  Filled 2023-10-09: qty 2

## 2023-10-09 MED ORDER — MIDAZOLAM HCL 2 MG/2ML IJ SOLN
INTRAMUSCULAR | Status: AC
  Filled 2023-10-09: qty 2

## 2023-10-09 MED ORDER — VERAPAMIL HCL 2.5 MG/ML IV SOLN
INTRAVENOUS | Status: DC | PRN
  Administered 2023-10-09: 10 mL via INTRA_ARTERIAL

## 2023-10-09 MED ORDER — HYDRALAZINE HCL 20 MG/ML IJ SOLN: 10.0000 mg | INTRAMUSCULAR | Status: DC | PRN

## 2023-10-09 MED ORDER — METOPROLOL SUCCINATE ER 25 MG PO TB24: 25.0000 mg | ORAL_TABLET | Freq: Every day | ORAL | 3 refills | Status: DC

## 2023-10-09 MED ORDER — FENTANYL CITRATE (PF) 100 MCG/2ML IJ SOLN
INTRAMUSCULAR | Status: DC | PRN
  Administered 2023-10-09: 25 ug via INTRAVENOUS

## 2023-10-09 MED ORDER — HEPARIN SODIUM (PORCINE) 1000 UNIT/ML IJ SOLN
INTRAMUSCULAR | Status: AC
  Filled 2023-10-09: qty 10

## 2023-10-09 MED ORDER — LIDOCAINE HCL (PF) 1 % IJ SOLN
INTRAMUSCULAR | Status: AC
  Filled 2023-10-09: qty 30

## 2023-10-09 MED ORDER — SODIUM CHLORIDE 0.9 % IV SOLN: INTRAVENOUS | Status: DC

## 2023-10-09 MED ORDER — ASPIRIN 81 MG PO TBEC: 81.0000 mg | DELAYED_RELEASE_TABLET | Freq: Every day | ORAL | Status: DC

## 2023-10-09 MED ORDER — LIDOCAINE HCL (PF) 1 % IJ SOLN
INTRAMUSCULAR | Status: DC | PRN
  Administered 2023-10-09 (×2): 2 mL

## 2023-10-09 MED ORDER — SODIUM CHLORIDE 0.9 % IV SOLN
INTRAVENOUS | Status: DC | PRN
  Administered 2023-10-09: 20 mL/h via INTRAVENOUS

## 2023-10-09 MED ORDER — LABETALOL HCL 5 MG/ML IV SOLN: 10.0000 mg | INTRAVENOUS | Status: DC | PRN

## 2023-10-09 MED ORDER — ACETAMINOPHEN 325 MG PO TABS: 650.0000 mg | ORAL_TABLET | ORAL | Status: DC | PRN

## 2023-10-09 MED ORDER — HEPARIN SODIUM (PORCINE) 1000 UNIT/ML IJ SOLN
INTRAMUSCULAR | Status: DC | PRN
  Administered 2023-10-09: 3500 [IU] via INTRAVENOUS

## 2023-10-09 MED ORDER — ONDANSETRON HCL 4 MG/2ML IJ SOLN: 4.0000 mg | Freq: Four times a day (QID) | INTRAMUSCULAR | Status: DC | PRN

## 2023-10-09 MED ORDER — MIDAZOLAM HCL 2 MG/2ML IJ SOLN
INTRAMUSCULAR | Status: DC | PRN
  Administered 2023-10-09 (×2): 1 mg via INTRAVENOUS

## 2023-10-09 MED ORDER — HEPARIN (PORCINE) IN NACL 1000-0.9 UT/500ML-% IV SOLN
INTRAVENOUS | Status: DC | PRN
  Administered 2023-10-09 (×2): 500 mL

## 2023-10-09 MED ORDER — SODIUM CHLORIDE 0.9% FLUSH: 3.0000 mL | INTRAVENOUS | Status: DC | PRN

## 2023-10-09 MED ORDER — SPIRONOLACTONE 25 MG PO TABS: 12.5000 mg | ORAL_TABLET | Freq: Every day | ORAL | 3 refills | Status: DC

## 2023-10-09 MED ORDER — IOHEXOL 350 MG/ML SOLN
INTRAVENOUS | Status: DC | PRN
  Administered 2023-10-09: 45 mL

## 2023-10-09 MED ORDER — SODIUM CHLORIDE 0.9 % IV SOLN: 250.0000 mL | INTRAVENOUS | Status: DC | PRN

## 2023-10-09 MED ORDER — VERAPAMIL HCL 2.5 MG/ML IV SOLN
INTRAVENOUS | Status: AC
  Filled 2023-10-09: qty 2

## 2023-10-09 SURGICAL SUPPLY — 12 items
CATH BALLN WEDGE 5F 110CM (CATHETERS) IMPLANT
CATH INFINITI 5 FR JL3.5 (CATHETERS) IMPLANT
CATH INFINITI JR4 5F (CATHETERS) IMPLANT
COVER DOME SNAP 22 D (MISCELLANEOUS) IMPLANT
DEVICE RAD TR BAND REGULAR (VASCULAR PRODUCTS) IMPLANT
GLIDESHEATH SLEND SS 6F .021 (SHEATH) IMPLANT
GUIDEWIRE INQWIRE 1.5J.035X260 (WIRE) IMPLANT
KIT HEART LEFT (KITS) IMPLANT
PACK CARDIAC CATHETERIZATION (CUSTOM PROCEDURE TRAY) ×1 IMPLANT
SHEATH GLIDE SLENDER 4/5FR (SHEATH) IMPLANT
SHEATH PROBE COVER 6X72 (BAG) IMPLANT
TRANSDUCER W/STOPCOCK (MISCELLANEOUS) IMPLANT

## 2023-10-09 NOTE — Interval H&P Note (Signed)
 History and Physical Interval Note:  10/09/2023 7:49 AM  Krystal Silva  has presented today for surgery, with the diagnosis of Heart failure and chest pain.  The various methods of treatment have been discussed with the patient and family. After consideration of risks, benefits and other options for treatment, the patient has consented to  Procedure(s): RIGHT/LEFT HEART CATH AND CORONARY ANGIOGRAPHY (N/A) as a surgical intervention.  The patient's history has been reviewed, patient examined, no change in status, stable for surgery.  I have reviewed the patient's chart and labs.  Questions were answered to the patient's satisfaction.     Chasidy Janak

## 2023-10-10 MED ORDER — POTASSIUM CHLORIDE CRYS ER 10 MEQ PO TBCR: 30.0000 meq | EXTENDED_RELEASE_TABLET | Freq: Every day | ORAL | 3 refills | Status: DC

## 2023-10-10 NOTE — Telephone Encounter (Signed)
 Called patient per Dr. Julane Ny:  "Add kcl 20"  Pt verbalized understanding of same. Updated Rx sent.

## 2023-10-20 ENCOUNTER — Encounter (HOSPITAL_COMMUNITY): Payer: Self-pay

## 2023-10-23 ENCOUNTER — Telehealth (HOSPITAL_COMMUNITY): Payer: Self-pay

## 2023-10-23 NOTE — Telephone Encounter (Signed)
 Error

## 2023-11-01 ENCOUNTER — Ambulatory Visit (HOSPITAL_COMMUNITY)
Admission: RE | Admit: 2023-11-01 | Discharge: 2023-11-01 | Disposition: A | Discharge status: Home / Self Care | Source: Ambulatory Visit | Attending: Family Medicine | Admitting: Family Medicine

## 2023-11-01 ENCOUNTER — Other Ambulatory Visit (HOSPITAL_COMMUNITY): Payer: Self-pay

## 2023-11-01 ENCOUNTER — Ambulatory Visit (HOSPITAL_COMMUNITY): Payer: Self-pay | Admitting: Family Medicine

## 2023-11-01 ENCOUNTER — Encounter (HOSPITAL_COMMUNITY): Payer: Self-pay

## 2023-11-01 VITALS — BP 138/82 | HR 72 | Wt 164.6 lb

## 2023-11-01 DIAGNOSIS — G35 Multiple sclerosis: Secondary | ICD-10-CM | POA: Insufficient documentation

## 2023-11-01 DIAGNOSIS — Z87891 Personal history of nicotine dependence: Secondary | ICD-10-CM | POA: Diagnosis not present

## 2023-11-01 DIAGNOSIS — I428 Other cardiomyopathies: Secondary | ICD-10-CM | POA: Diagnosis not present

## 2023-11-01 DIAGNOSIS — Z8249 Family history of ischemic heart disease and other diseases of the circulatory system: Secondary | ICD-10-CM | POA: Insufficient documentation

## 2023-11-01 DIAGNOSIS — R001 Bradycardia, unspecified: Secondary | ICD-10-CM | POA: Diagnosis not present

## 2023-11-01 DIAGNOSIS — I5022 Chronic systolic (congestive) heart failure: Secondary | ICD-10-CM | POA: Diagnosis not present

## 2023-11-01 DIAGNOSIS — I10 Essential (primary) hypertension: Secondary | ICD-10-CM | POA: Diagnosis not present

## 2023-11-01 DIAGNOSIS — I110 Hypertensive heart disease with heart failure: Secondary | ICD-10-CM | POA: Diagnosis not present

## 2023-11-01 DIAGNOSIS — I5021 Acute systolic (congestive) heart failure: Secondary | ICD-10-CM | POA: Diagnosis present

## 2023-11-01 DIAGNOSIS — Z79899 Other long term (current) drug therapy: Secondary | ICD-10-CM | POA: Diagnosis not present

## 2023-11-01 DIAGNOSIS — E785 Hyperlipidemia, unspecified: Secondary | ICD-10-CM | POA: Insufficient documentation

## 2023-11-01 LAB — CBC
HCT: 37.5 % (ref 36.0–46.0)
Hemoglobin: 12.4 g/dL (ref 12.0–15.0)
MCH: 29.9 pg (ref 26.0–34.0)
MCHC: 33.1 g/dL (ref 30.0–36.0)
MCV: 90.4 fL (ref 80.0–100.0)
Platelets: 277 10*3/uL (ref 150–400)
RBC: 4.15 MIL/uL (ref 3.87–5.11)
RDW: 13.1 % (ref 11.5–15.5)
WBC: 8.8 10*3/uL (ref 4.0–10.5)
nRBC: 0 % (ref 0.0–0.2)

## 2023-11-01 LAB — IRON AND TIBC
Iron: 35 ug/dL (ref 28–170)
Saturation Ratios: 10 % — ABNORMAL LOW (ref 10.4–31.8)
TIBC: 337 ug/dL (ref 250–450)
UIBC: 302 ug/dL

## 2023-11-01 LAB — BASIC METABOLIC PANEL WITH GFR
Anion gap: 5 (ref 5–15)
BUN: 12 mg/dL (ref 8–23)
CO2: 27 mmol/L (ref 22–32)
Calcium: 8.8 mg/dL — ABNORMAL LOW (ref 8.9–10.3)
Chloride: 111 mmol/L (ref 98–111)
Creatinine, Ser: 1 mg/dL (ref 0.44–1.00)
GFR, Estimated: >60 mL/min (ref >60)
Glucose, Bld: 100 mg/dL — ABNORMAL HIGH (ref 70–99)
Potassium: 3.6 mmol/L (ref 3.5–5.1)
Sodium: 143 mmol/L (ref 135–145)

## 2023-11-01 LAB — BRAIN NATRIURETIC PEPTIDE: B Natriuretic Peptide: 1390.8 pg/mL — ABNORMAL HIGH (ref 0.0–100.0)

## 2023-11-01 LAB — FERRITIN: Ferritin: 29 ng/mL (ref 11–307)

## 2023-11-01 MED ORDER — ENTRESTO 24-26 MG PO TABS: 1.0000 | ORAL_TABLET | Freq: Two times a day (BID) | ORAL | 2 refills | Status: DC

## 2023-11-01 NOTE — Progress Notes (Signed)
 ADVANCED HF CLINIC NOTE  Primary Care: Cook, Jayce G, DO HF Cardiologist: Dr. Julane Ny  HPI:  Krystal Silva is a 68 y.o. woman with h/o HTN, Krystal, previous tobacco abuse (quit 2006 after 45 pys) and hyperlipidemia referred by Dr. Debrah Fan for new onset HF.   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 Lasted 1 minute then resolved. Saw Dr. Debrah Fan and was referred back to her Krystal neurologist. Carotid u/s <1-39% bilaterally. Had MRI 12/24 which was c/w her Krystal.   In March 2025 was at the beach and had episode of chest tightness and tried her brother's inhaler which helped. No CP since that time.  Saw Dr. Debrah Fan on 09/12/23 for one week h/o SOB. BNP 629. Lasix  20mg  daily started and echo ordered.   Echo 10/03/23 EF 20-25% with global HK and moderate RV HK. Moderate MR  Initial visit with Dr. Julane Ny 10/06/23. GDMT added, arranged for [facility]  [facility] 5/25 showed normal cors (vertically oriented path of prox LAD), well-compensated hemodynamics, EF 30-35%.   Today she returns for post cath HF follow up. Overall feeling fatigued. She is back at work, fatigued when she gets home. Chest is feels tight. She has SOB walking up steps. She was dizzy on daily Lasix , now taking PRN. Denies palpitations, abnormal bleeding, CP, edema or PND/Orthopnea. Appetite ok. Weight at home 152-164 pounds. Taking all medications.   Family Hx: mother passed of aortic aneurysm, had uncontrolled HTN x years, father had irregular heart rhythm  Cardiac [facility] - [facility] 5/25: normal cors, vertically oriented path of proximal LAD; RA 3, PA 28/5 (17), PCWP 5, CO/CI (Fick) 5.5/3.0, PVR 2.2, PAPi 7.7.  - Echo 5/25: EF 20-25% with global HK and moderate RV HK. Moderate MR  Past Medical History:  Diagnosis Date   Allergy    Basal cell carcinoma 09/04/2019   right parotid area - Refer to [facility]   Diverticulitis    GERD  (gastroesophageal reflux disease)    Hypercholesteremia    Hypertension    Insomnia    Krystal (multiple sclerosis) (HCC)    Vitamin D  deficiency    Current Outpatient Medications  Medication Sig Dispense Refill   aspirin  EC 81 MG tablet Take 81 mg by mouth at bedtime. Swallow whole.     baclofen  (LIORESAL ) 10 MG tablet TAKE 1 TABLET (10 MG TOTAL) BY MOUTH AT BEDTIME. MAKE TAKE AN ADDITIONAL DOSE IF NEEDED 180 tablet 5   cetirizine (ZYRTEC) 10 MG tablet Take 10 mg by mouth daily as needed for allergies.     Cholecalciferol (VITAMIN D -3) 125 MCG (5000 UT) TABS Take 1 tablet by mouth daily. (Patient taking differently: Take 1 tablet by mouth every evening.) 90 tablet 1   diclofenac  Sodium (VOLTAREN ) 1 % GEL Apply 2 g topically 4 (four) times daily as needed (pain.).     diphenhydrAMINE (BENADRYL) 25 MG tablet Take 25 mg by mouth at bedtime as needed for allergies.     ibuprofen  (ADVIL ) 200 MG tablet Take 200-400 mg by mouth every 8 (eight) hours as needed (pain/headaches.).     imipramine  (TOFRANIL ) 25 MG tablet TAKE 1 TO 2 TABLETS (25 TO 50 MG TOTAL) BY MOUTH AT BEDTIME 180 tablet 1   Lidocaine  (BLUE-EMU PAIN RELIEF DRY EX) Apply 1 application  topically 2 (two) times daily as needed (pain.).     loperamide (IMODIUM) 2 MG capsule Take 2-4 mg  by mouth as needed for diarrhea or loose stools.     losartan  (COZAAR ) 100 MG tablet TAKE 1 TABLET BY MOUTH EVERY DAY 90 tablet 1   metoprolol  succinate (TOPROL  XL) 25 MG 24 hr tablet Take 1 tablet (25 mg total) by mouth daily. 90 tablet 3   ondansetron  (ZOFRAN -ODT) 4 MG disintegrating tablet Place 1 tablet (4 mg total) under your tongue every 6-8 hours as needed for nausea/vomiting. 20 tablet 2   pantoprazole  (PROTONIX ) 40 MG tablet Take 1 tablet (40 mg total) by mouth 2 (two) times daily before a meal. 180 tablet 2   potassium chloride  (KLOR-CON  M10) 10 MEQ tablet Take 3 tablets (30 mEq total) by mouth daily. 270 tablet 3   Probiotic Product (PROBIOTIC DAILY  PO) Take 2-3 capsules by mouth daily.     sertraline  (ZOLOFT ) 100 MG tablet TAKE 1 TABLET BY MOUTH EVERY DAY (Patient taking differently: Take 100 mg by mouth at bedtime.) 90 tablet 2   simvastatin  (ZOCOR ) 40 MG tablet TAKE 1 TABLET BY MOUTH EVERYDAY AT BEDTIME 90 tablet 1   spironolactone  (ALDACTONE ) 25 MG tablet Take 0.5 tablets (12.5 mg total) by mouth daily. 90 tablet 3   No current facility-administered medications for this encounter.   Allergies  Allergen Reactions   Cefzil  [Cefprozil ] Diarrhea   Codeine Nausea And Vomiting   Penicillins Rash   Sulfonamide Derivatives Rash   Social History   Socioeconomic History   Marital status: Divorced    Spouse name: Not on file   Number of children: Not on file   Years of education: 10   Highest education level: Master's degree (e.g., MA, Krystal, MEng, MEd, MSW, MBA)  Occupational History   Occupation: work   Tobacco Use   Smoking status: Former    Current packs/day: 0.00    Average packs/day: 1.5 packs/day for 30.0 years (45.0 ttl pk-yrs)    Types: Cigarettes    Start date: 12/26/1974    Quit date: 06/22/2004    Years since quitting: 19.3   Smokeless tobacco: Never  Vaping Use   Vaping status: Never Used  Substance and Sexual Activity   Alcohol use: Yes    Alcohol/week: 1.0 standard drink of alcohol    Types: 1 Cans of beer per week    Comment: rare social   Drug use: No   Sexual activity: Not Currently  Other Topics Concern   Not on file  Social History Narrative   Coffee in the morning couple of cups    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[employer]   Social Drivers of Health   Financial Resource Strain: Low Risk  (05/12/2023)   Overall Financial Resource Strain (CARDIA)    Difficulty of Paying Living Expenses: Not very hard  Food Insecurity: No Food Insecurity (05/12/2023)   Hunger Vital Sign    Worried About Running Out of Food in the Last Year: Never true    Ran Out of Food in the  Last Year: Never true  Transportation Needs: No Transportation Needs (05/12/2023)   PRAPARE - Administrator, Civil Service (Medical): No    Lack of Transportation (Non-Medical): No  Physical Activity: Insufficiently Active (05/12/2023)   Exercise Vital Sign    Days of Exercise per Week: 3 days    Minutes of Exercise per Session: 20 min  Stress: No Stress Concern Present (05/12/2023)   [employer] of Occupational Health - Occupational Stress Questionnaire  Feeling of Stress : Only a little  Social Connections: Socially Isolated (05/12/2023)   Social Connection and Isolation Panel [NHANES]    Frequency of Communication with Friends and Family: Once a week    Frequency of Social Gatherings with Friends and Family: Once a week    Attends Religious Services: Never    Database administrator or Organizations: No    Attends Engineer, structural: Not on file    Marital Status: Divorced  Catering manager Violence: Not on file    Family History  Problem Relation Age of Onset   Hypertension Mother    Hyperlipidemia Father    Hearing loss Father    Hyperlipidemia Brother    Diabetes Brother    Vision loss Maternal Grandmother    Vision loss Paternal Grandmother    Cancer Other        breast   Inflammatory bowel disease Neg Hx    Colon cancer Neg Hx    Ulcerative colitis Neg Hx    Crohn's disease Neg Hx    Wt Readings from Last 3 Encounters:  11/01/23 74.7 kg (164 lb 9.6 oz)  10/09/23 74.8 kg (165 lb)  10/06/23 74.5 kg (164 lb 3.2 oz)   BP 138/82   Pulse 72   Wt 74.7 kg (164 lb 9.6 oz)   SpO2 96%   BMI 28.25 kg/m   PHYSICAL EXAM: General:  NAD. No resp difficulty, walked into clinic HEENT: Normal Neck: Supple. No JVD. Cor: Regular rate & rhythm. No rubs, gallops or murmurs. Lungs: Clear Abdomen: Soft, nontender, nondistended.  Extremities: No cyanosis, clubbing, rash, edema Neuro: Alert & oriented x 3, moves all 4 extremities w/o difficulty.  Affect pleasant.  ECG (personally reviewed): SB 59 bpm  ReDs reading: 31%, normal  ASSESSMENT & PLAN:  1. New onset acute systolic HF - Echo 10/03/23 EF 16-10% with global HK and moderate RV HK. Moderate MR - R/LHC 5/25: NICM, normal cors with vertically oriented path of prox LAD, well compensated hemodynamics - Arrange cMRI to evaluate for infiltrative disease and cardiac CTA to evaluate LAD course - NYHA II, volume OK today, ReDs 31%. - Stop losartan . - Start Entresto 24/26 mg bid - Continue Lasix  20 mg PRN - Continue spiro 12.5 mg daily. - Continue Toprol  XL 25 mg daily. - Start SGLT2i next visit. - Discuss Cardiac rehab next visit - Labs today.  2. HTN  - BP up a bit today - Starting Entresto as above  3. Multiple sclerosis - per Neurology.  Follow up in 3-4 weeks with PharmD and 3 months with Dr. Julane Ny + echo.  Elmarie Hacking, FNP  1:39 PM

## 2023-11-01 NOTE — Progress Notes (Signed)
 ReDS Vest / Clip - 11/01/23 1500       ReDS Vest / Clip   Station Marker A    Ruler Value 27    ReDS Value Range Low volume    ReDS Actual Value 31

## 2023-11-01 NOTE — Patient Instructions (Addendum)
 Good to see you today!  STOP Losartan   START Entresto  24/26 mg Twice daily  Your physician has requested that you have an echocardiogram. Echocardiography is a painless test that uses sound waves to create images of your heart. It provides your doctor with information about the size and shape of your heart and how well your heart's chambers and valves are working. This procedure takes approximately one hour. There are no restrictions for this procedure. Please do NOT wear cologne, perfume, aftershave, or lotions (deodorant is allowed). Please arrive 15 minutes prior to your appointment time.  Please note: We ask at that you not bring children with you during ultrasound (echo/ vascular) testing. Due to room size and safety concerns, children are not allowed in the ultrasound rooms during exams. Our front office staff cannot provide observation of children in our lobby area while testing is being conducted. An adult accompanying a patient to their appointment will only be allowed in the ultrasound room at the discretion of the ultrasound technician under special circumstances. We apologize for any inconvenience.  Your physician has requested that you have a cardiac MRI. Cardiac MRI uses a computer to create images of your heart as its beating, producing both still and moving pictures of your heart and major blood vessels. For further information please visit [email or web address]. Please follow the instruction sheet given to you today for more information.  Cardiac CTA ordered you will be called to schedule an appointment  Please follow up with our heart failure pharmacist  As scheduled  Labs done today, your results will be available in MyChart, we will contact you for abnormal readings.   Your physician recommends that you schedule a follow-up appointment   If you have any questions or concerns before your next appointment please send us  a message through [place] or call our office at  [phone].    TO LEAVE A MESSAGE FOR THE NURSE SELECT OPTION 2, PLEASE LEAVE A MESSAGE INCLUDING: YOUR NAME DATE OF BIRTH CALL BACK NUMBER REASON FOR CALL**this is important as we prioritize the call backs  YOU WILL RECEIVE A CALL BACK THE SAME DAY AS LONG AS YOU CALL BEFORE 4:00 PM At the [facility], you and your health needs are our priority. As part of our continuing mission to provide you with exceptional heart care, we have created designated Provider Care Teams. These Care Teams include your primary Cardiologist (physician) and Advanced Practice Providers (APPs- Physician Assistants and Nurse Practitioners) who all work together to provide you with the care you need, when you need it.   You may see any of the following providers on your designated Care Team at your next follow up: Dr Jules Oar Dr Peder Bourdon Dr. Alwin Baars Dr. Arta Lark Amy Marijane Shoulders, NP Ruddy Corral, [place] [facility] [place], [place] Dennise Fitz, NP [place], NP Shawnee Dellen, NP Luster Salters, PharmD Bevely Brush, PharmD   Please be sure to bring in all your medications bottles to every appointment.    Thank you for choosing [facility]

## 2023-11-02 ENCOUNTER — Telehealth (HOSPITAL_COMMUNITY): Payer: Self-pay | Admitting: Pharmacy Technician

## 2023-11-02 MED ORDER — FUROSEMIDE 20 MG PO TABS: 20.0000 mg | ORAL_TABLET | ORAL | Status: DC

## 2023-11-02 NOTE — Telephone Encounter (Signed)
 Pharmacy Patient Advocate Encounter  Insurance verification completed.   The patient is insured through [facility]   Ran test claim for [employer]. Currently a quantity of 180 is a 90 day supply and the co-pay is $0 .  This test claim was processed through [facility]- copay amounts may vary at other pharmacies due to pharmacy/plan contracts, or as the patient moves through the different stages of their insurance plan.

## 2023-11-07 ENCOUNTER — Encounter (HOSPITAL_COMMUNITY): Payer: Self-pay

## 2023-11-09 ENCOUNTER — Ambulatory Visit (HOSPITAL_COMMUNITY)
Admission: RE | Admit: 2023-11-09 | Discharge: 2023-11-09 | Disposition: A | Discharge status: Home / Self Care | Source: Ambulatory Visit | Attending: Family Medicine | Admitting: Family Medicine

## 2023-11-09 DIAGNOSIS — I5022 Chronic systolic (congestive) heart failure: Secondary | ICD-10-CM

## 2023-11-09 MED ORDER — DILTIAZEM HCL 25 MG/5ML IV SOLN
10.0000 mg | INTRAVENOUS | Status: DC | PRN
Start: 2023-11-09 — End: 2023-11-10

## 2023-11-09 MED ORDER — NITROGLYCERIN 0.4 MG SL SUBL
0.8000 mg | SUBLINGUAL_TABLET | Freq: Once | SUBLINGUAL | Status: AC
  Administered 2023-11-09: 0.8 mg via SUBLINGUAL

## 2023-11-09 MED ORDER — METOPROLOL TARTRATE 5 MG/5ML IV SOLN: 10.0000 mg | Freq: Once | INTRAVENOUS | Status: DC | PRN

## 2023-11-09 MED ORDER — IOHEXOL 350 MG/ML SOLN
100.0000 mL | Freq: Once | INTRAVENOUS | Status: AC | PRN
  Administered 2023-11-09: 100 mL via INTRAVENOUS

## 2023-11-10 NOTE — Telephone Encounter (Addendum)
 Pt aware, agreeable, and verbalized understanding   ----- Message from [facility] sent at 11/10/2023  7:38 AM EDT ----- CT coronary test showed no CAD (no evidence of plaque/blockages in heart arteries). Normal study. Please notify patient  ----- Message ----- From: Interface, Rad Results In Sent: 11/09/2023  12:21 PM EDT To: Elmarie Hacking, FNP

## 2023-11-13 ENCOUNTER — Encounter: Payer: Self-pay | Admitting: Family Medicine

## 2023-11-13 ENCOUNTER — Ambulatory Visit (INDEPENDENT_AMBULATORY_CARE_PROVIDER_SITE_OTHER): Payer: Commercial Managed Care - PPO | Admitting: Family Medicine

## 2023-11-13 ENCOUNTER — Telehealth: Payer: Self-pay | Admitting: Pharmacy Technician

## 2023-11-13 ENCOUNTER — Other Ambulatory Visit (HOSPITAL_COMMUNITY): Payer: Self-pay

## 2023-11-13 VITALS — BP 140/82 | HR 59 | Temp 98.0°F | Ht 64.0 in | Wt 160.0 lb

## 2023-11-13 DIAGNOSIS — I4891 Unspecified atrial fibrillation: Secondary | ICD-10-CM

## 2023-11-13 DIAGNOSIS — I5020 Unspecified systolic (congestive) heart failure: Secondary | ICD-10-CM | POA: Diagnosis not present

## 2023-11-13 DIAGNOSIS — Z780 Asymptomatic menopausal state: Secondary | ICD-10-CM | POA: Diagnosis not present

## 2023-11-13 MED ORDER — APIXABAN 5 MG PO TABS: 5.0000 mg | ORAL_TABLET | Freq: Two times a day (BID) | ORAL | 1 refills | Status: DC

## 2023-11-13 MED ORDER — METOPROLOL SUCCINATE ER 50 MG PO TB24: 50.0000 mg | ORAL_TABLET | Freq: Every day | ORAL | 3 refills | Status: DC

## 2023-11-13 NOTE — Patient Instructions (Addendum)
 Stop aspirin . Start Eliquis.  Metoprolol  increased.  Awaiting final response from Bensimhon. Consider going to the ER @ [facility].

## 2023-11-13 NOTE — Assessment & Plan Note (Signed)
 Patient in A-fib today.  Discussed with cardiologist, Dr. Bensimhon.  CHA2DS2-VASc score of 3.  Starting Eliquis.  Rate 129 here today.  Discussed going to the hospital.  Patient does not want to go to the hospital at this time.  She is minimally symptomatic with dizziness and fatigue.  Advised her that she should go directly to the ER if she worsens.  Increasing dose of metoprolol .  Trying to get her back in with cardiology as soon as possible.

## 2023-11-13 NOTE — Progress Notes (Signed)
 Subjective:  Patient ID: Krystal Silva Risk, female    DOB: 1956/05/04  Age: 68 y.o. MRN: [id number]  CC:   Chief Complaint  Patient presents with   Follow-up    6 month f/u hypertension, anemia, hyperlipidemia    HPI:  68 year old female with recent diagnosis of congestive heart failure presents for follow-up.  Patient states that she is having fatigue.  She has had some dizziness since yesterday.  Patient has had a recent extensive cardiac workup.  Following with heart failure clinic.  She currently on goal-directed medical therapy regarding systolic heart failure.  No chest pain.  Patient Active Problem List   Diagnosis Date Noted   HFrEF (heart failure with reduced ejection fraction) (HCC) 11/13/2023   New onset atrial fibrillation (HCC) 11/13/2023   Dense breasts 12/05/2021   Hyperlipidemia 07/16/2021   Gastroesophageal reflux disease 10/12/2020   Restless leg syndrome 08/30/2017   Seasonal allergies 02/22/2017   Primary osteoarthritis of right knee    Essential hypertension, benign 05/27/2013   Multiple sclerosis (HCC) 03/06/2013   Depression with anxiety 11/02/2012    Social Hx   Social History   Socioeconomic History   Marital status: Divorced    Spouse name: Not on file   Number of children: Not on file   Years of education: 18   Highest education level: Master's degree (e.g., MA, MS, MEng, MEd, MSW, MBA)  Occupational History   Occupation: work   Tobacco Use   Smoking status: Former    Current packs/day: 0.00    Average packs/day: 1.5 packs/day for 30.0 years (45.0 ttl pk-yrs)    Types: Cigarettes    Start date: 12/26/1974    Quit date: 06/22/2004    Years since quitting: 19.4   Smokeless tobacco: Never  Vaping Use   Vaping status: Never Used  Substance and Sexual Activity   Alcohol use: Yes    Alcohol/week: 1.0 standard drink of alcohol    Types: 1 Cans of beer per week    Comment: rare social   Drug use: No   Sexual activity: Not Currently  Other  Topics Concern   Not on file  Social History Narrative   Coffee in the morning couple of cups    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[employer]   Social Drivers of Health   Financial Resource Strain: Low Risk  (11/12/2023)   Overall Financial Resource Strain (CARDIA)    Difficulty of Paying Living Expenses: Not very hard  Food Insecurity: No Food Insecurity (11/12/2023)   Hunger Vital Sign    Worried About Running Out of Food in the Last Year: Never true    Ran Out of Food in the Last Year: Never true  Transportation Needs: No Transportation Needs (11/12/2023)   PRAPARE - Administrator, Civil Service (Medical): No    Lack of Transportation (Non-Medical): No  Physical Activity: Insufficiently Active (11/12/2023)   Exercise Vital Sign    Days of Exercise per Week: 3 days    Minutes of Exercise per Session: 20 min  Stress: Stress Concern Present (11/12/2023)   [employer] of Occupational Health - Occupational Stress Questionnaire    Feeling of Stress: To some extent  Social Connections: Socially Isolated (11/12/2023)   Social Connection and Isolation Panel    Frequency of Communication with Friends and Family: Once a week    Frequency of Social Gatherings with Friends and Family: Once a week  Attends Religious Services: Never    Active Member of Clubs or Organizations: No    Attends Engineer, structural: Not on file    Marital Status: Divorced    Review of Systems Per HPI  Objective:  BP (!) 140/82   Pulse (!) 59   Temp 98 F (36.7 C)   Ht 5' 4 (1.626 m)   Wt 160 lb (72.6 kg)   SpO2 99%   BMI 27.46 kg/m      11/13/2023    8:39 AM 11/09/2023    8:25 AM 11/09/2023    8:10 AM  BP/Weight  Systolic BP 140 141 149  Diastolic BP 82 68 79  Wt. (Lbs) 160    BMI 27.46 kg/m2      Physical Exam Vitals and nursing note reviewed.  Constitutional:      General: She is not in acute distress.    Appearance: Normal  appearance.  HENT:     Head: Normocephalic and atraumatic.   Cardiovascular:     Comments: Irregularly regular. Pulmonary:     Effort: Pulmonary effort is normal.     Breath sounds: Normal breath sounds. No wheezing, rhonchi or rales.   Neurological:     Mental Status: She is alert.   Psychiatric:        Mood and Affect: Mood normal.        Behavior: Behavior normal.     Lab Results  Component Value Date   WBC 8.8 11/01/2023   HGB 12.4 11/01/2023   HCT 37.5 11/01/2023   PLT 277 11/01/2023   GLUCOSE 100 (H) 11/01/2023   CHOL 146 05/15/2023   TRIG 178 (H) 05/15/2023   HDL 45 05/15/2023   LDLCALC 71 05/15/2023   ALT 17 05/15/2023   AST 18 05/15/2023   NA 143 11/01/2023   K 3.6 11/01/2023   CL 111 11/01/2023   CREATININE 1.00 11/01/2023   BUN 12 11/01/2023   CO2 27 11/01/2023   TSH 3.850 10/12/2020     Assessment & Plan:  New onset atrial fibrillation [facility]) Assessment & Plan: Patient in A-fib today.  Discussed with cardiologist, Dr. Bensimhon.  CHA2DS2-VASc score of 3.  Starting Eliquis.  Rate 129 here today.  Discussed going to the hospital.  Patient does not want to go to the hospital at this time.  She is minimally symptomatic with dizziness and fatigue.  Advised her that she should go directly to the ER if she worsens.  Increasing dose of metoprolol .  Trying to get her back in with cardiology as soon as possible.  Orders: -     EKG 12-Lead -     Apixaban; Take 1 tablet (5 mg total) by mouth 2 (two) times daily.  Dispense: 180 tablet; Refill: 1  HFrEF (heart failure with reduced ejection fraction) (HCC)  Postmenopausal -     DG Bone Density  Other orders -     Metoprolol  Succinate ER; Take 1 tablet (50 mg total) by mouth daily.  Dispense: 90 tablet; Refill: 3    Follow-up:  Pending response from cardiology  Jacqulyn Ahle DO [facility]

## 2023-11-13 NOTE — Telephone Encounter (Signed)
 Pharmacy Patient Advocate Encounter   Received notification from CoverMyMeds that prior authorization for ELIQUIS 5MG  TABLETS is required/requested.   Insurance verification completed.   The patient is insured through [employer] .   Per test claim: PA required; PA started via CoverMyMeds. KEY [id number] . Please see clinical question(s) below that I am not finding the answer to in their chart and advise.

## 2023-11-14 ENCOUNTER — Other Ambulatory Visit (HOSPITAL_COMMUNITY): Payer: Self-pay | Admitting: Internal Medicine

## 2023-11-14 ENCOUNTER — Other Ambulatory Visit (HOSPITAL_COMMUNITY): Payer: Self-pay

## 2023-11-14 ENCOUNTER — Ambulatory Visit (HOSPITAL_COMMUNITY)
Admission: RE | Admit: 2023-11-14 | Discharge: 2023-11-14 | Disposition: A | Discharge status: Home / Self Care | Source: Ambulatory Visit | Attending: Internal Medicine | Admitting: Internal Medicine

## 2023-11-14 ENCOUNTER — Inpatient Hospital Stay (HOSPITAL_COMMUNITY)
Admission: RE | Admit: 2023-11-14 | Discharge: 2023-11-14 | Disposition: A | Discharge status: Home / Self Care | Source: Ambulatory Visit | Attending: Internal Medicine | Admitting: Internal Medicine

## 2023-11-14 ENCOUNTER — Other Ambulatory Visit: Payer: Self-pay

## 2023-11-14 VITALS — BP 108/76 | HR 69 | Wt 163.2 lb

## 2023-11-14 DIAGNOSIS — I10 Essential (primary) hypertension: Secondary | ICD-10-CM

## 2023-11-14 DIAGNOSIS — I4891 Unspecified atrial fibrillation: Secondary | ICD-10-CM

## 2023-11-14 DIAGNOSIS — Z87891 Personal history of nicotine dependence: Secondary | ICD-10-CM | POA: Diagnosis not present

## 2023-11-14 DIAGNOSIS — I110 Hypertensive heart disease with heart failure: Secondary | ICD-10-CM | POA: Diagnosis present

## 2023-11-14 DIAGNOSIS — E785 Hyperlipidemia, unspecified: Secondary | ICD-10-CM | POA: Diagnosis not present

## 2023-11-14 DIAGNOSIS — Z7901 Long term (current) use of anticoagulants: Secondary | ICD-10-CM | POA: Insufficient documentation

## 2023-11-14 DIAGNOSIS — I5022 Chronic systolic (congestive) heart failure: Secondary | ICD-10-CM | POA: Insufficient documentation

## 2023-11-14 DIAGNOSIS — I480 Paroxysmal atrial fibrillation: Secondary | ICD-10-CM

## 2023-11-14 DIAGNOSIS — G35 Multiple sclerosis: Secondary | ICD-10-CM | POA: Insufficient documentation

## 2023-11-14 MED ORDER — APIXABAN 5 MG PO TABS
5.0000 mg | ORAL_TABLET | Freq: Two times a day (BID) | ORAL | 5 refills | Status: DC
  Filled 2023-11-14 (×2): qty 60, 30d supply, fill #0

## 2023-11-14 NOTE — Progress Notes (Signed)
 ADVANCED HF CLINIC NOTE  Primary Care: Cook, Jayce G, DO HF Cardiologist: Dr. Cherrie  HPI:  Krystal Silva is a 68 y.o. woman with h/o HTN, Krystal, previous tobacco abuse (quit 2006 after 45 pys) and hyperlipidemia referred by Dr. Bluford for new onset HF.   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 Lasted 1 minute then resolved. Saw Dr. Bluford and was referred back to her Krystal neurologist. Carotid u/s <1-39% bilaterally. Had MRI 12/24 which was c/w her Krystal.   In March 2025 was at the beach and had episode of chest tightness and tried her brother's inhaler which helped. No CP since that time.  Saw Dr. Bluford on 09/12/23 for one week h/o SOB. BNP 629. Lasix  20mg  daily started and echo ordered.   Echo 10/03/23 EF 20-25% with global HK and moderate RV HK. Moderate MR  [facility] 5/25 showed normal cors (vertically oriented path of prox LAD), well-compensated hemodynamics, EF 30-35%.   Seen by PCP yesterday found to be in new AF with RVR. Started on Eliquis & Toprol  increased. Referred here.  She is back to work. Still with exertional fatigue and mild DOE. No edema, orthopnea or PND. Did not clearly feel AF. Denies snoring.   Family Hx: mother passed of aortic aneurysm, had uncontrolled HTN x years, father had irregular heart rhythm  Cardiac Studies - [facility] 5/25: normal cors, vertically oriented path of proximal LAD; RA 3, PA 28/5 (17), PCWP 5, CO/CI (Fick) 5.5/3.0, PVR 2.2, PAPi 7.7.  - Echo 5/25: EF 20-25% with global HK and moderate RV HK. Moderate MR  Past Medical History:  Diagnosis Date   Allergy    Basal cell carcinoma 09/04/2019   right parotid area - Refer to [facility]   Diverticulitis    GERD (gastroesophageal reflux disease)    Hypercholesteremia    Hypertension    Insomnia    Krystal (multiple sclerosis) (HCC)    Vitamin D  deficiency    Current Outpatient Medications  Medication Sig  Dispense Refill   baclofen  (LIORESAL ) 10 MG tablet TAKE 1 TABLET (10 MG TOTAL) BY MOUTH AT BEDTIME. MAKE TAKE AN ADDITIONAL DOSE IF NEEDED 180 tablet 5   cetirizine (ZYRTEC) 10 MG tablet Take 10 mg by mouth daily as needed for allergies.     Cholecalciferol (VITAMIN D -3) 125 MCG (5000 UT) TABS Take 1 tablet by mouth daily. 90 tablet 1   diclofenac  Sodium (VOLTAREN ) 1 % GEL Apply 2 g topically 4 (four) times daily as needed (pain.).     furosemide  (LASIX ) 20 MG tablet Take 1 tablet (20 mg total) by mouth every Monday, Wednesday, and Friday.     imipramine  (TOFRANIL ) 25 MG tablet TAKE 1 TO 2 TABLETS (25 TO 50 MG TOTAL) BY MOUTH AT BEDTIME 180 tablet 1   Lidocaine  (BLUE-EMU PAIN RELIEF DRY EX) Apply 1 application  topically 2 (two) times daily as needed (pain.).     metoprolol  succinate (TOPROL  XL) 50 MG 24 hr tablet Take 1 tablet (50 mg total) by mouth daily. 90 tablet 3   pantoprazole  (PROTONIX ) 40 MG tablet Take 1 tablet (40 mg total) by mouth 2 (two) times daily before a meal. 180 tablet 2   potassium chloride  (KLOR-CON  M10) 10 MEQ tablet Take 3 tablets (30 mEq total) by mouth daily. 270 tablet 3   Probiotic Product (PROBIOTIC DAILY PO) Take 2-3 capsules by mouth daily.  sacubitril-valsartan (ENTRESTO ) 24-26 MG Take 1 tablet by mouth 2 (two) times daily. 90 tablet 2   sertraline  (ZOLOFT ) 100 MG tablet TAKE 1 TABLET BY MOUTH EVERY DAY 90 tablet 2   simvastatin  (ZOCOR ) 40 MG tablet TAKE 1 TABLET BY MOUTH EVERYDAY AT BEDTIME 90 tablet 1   spironolactone  (ALDACTONE ) 25 MG tablet Take 0.5 tablets (12.5 mg total) by mouth daily. 90 tablet 3   apixaban (ELIQUIS) 5 MG TABS tablet Take 1 tablet (5 mg total) by mouth 2 (two) times daily. 60 tablet 5   No current facility-administered medications for this encounter.   Allergies  Allergen Reactions   Cefzil  [Cefprozil ] Diarrhea   Codeine Nausea And Vomiting   Penicillins Rash   Sulfonamide Derivatives Rash   Social History   Socioeconomic  History   Marital status: Divorced    Spouse name: Not on file   Number of children: Not on file   Years of education: 13   Highest education level: Master's degree (e.g., MA, Krystal, MEng, MEd, MSW, MBA)  Occupational History   Occupation: work   Tobacco Use   Smoking status: Former    Current packs/day: 0.00    Average packs/day: 1.5 packs/day for 30.0 years (45.0 ttl pk-yrs)    Types: Cigarettes    Start date: 12/26/1974    Quit date: 06/22/2004    Years since quitting: 19.4   Smokeless tobacco: Never  Vaping Use   Vaping status: Never Used  Substance and Sexual Activity   Alcohol use: Yes    Alcohol/week: 1.0 standard drink of alcohol    Types: 1 Cans of beer per week    Comment: rare social   Drug use: No   Sexual activity: Not Currently  Other Topics Concern   Not on file  Social History Narrative   Coffee in the morning couple of cups    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[employer]   Social Drivers of Health   Financial Resource Strain: Low Risk  (11/12/2023)   Overall Financial Resource Strain (CARDIA)    Difficulty of Paying Living Expenses: Not very hard  Food Insecurity: No Food Insecurity (11/12/2023)   Hunger Vital Sign    Worried About Running Out of Food in the Last Year: Never true    Ran Out of Food in the Last Year: Never true  Transportation Needs: No Transportation Needs (11/12/2023)   PRAPARE - Administrator, Civil Service (Medical): No    Lack of Transportation (Non-Medical): No  Physical Activity: Insufficiently Active (11/12/2023)   Exercise Vital Sign    Days of Exercise per Week: 3 days    Minutes of Exercise per Session: 20 min  Stress: Stress Concern Present (11/12/2023)   [employer] of Occupational Health - Occupational Stress Questionnaire    Feeling of Stress: To some extent  Social Connections: Socially Isolated (11/12/2023)   Social Connection and Isolation Panel    Frequency of  Communication with Friends and Family: Once a week    Frequency of Social Gatherings with Friends and Family: Once a week    Attends Religious Services: Never    Database administrator or Organizations: No    Attends Engineer, structural: Not on file    Marital Status: Divorced  Catering manager Violence: Not on file    Family History  Problem Relation Age of Onset   Hypertension Mother    Hyperlipidemia Father  Hearing loss Father    Hyperlipidemia Brother    Diabetes Brother    Vision loss Maternal Grandmother    Vision loss Paternal Grandmother    Cancer Other        breast   Inflammatory bowel disease Neg Hx    Colon cancer Neg Hx    Ulcerative colitis Neg Hx    Crohn's disease Neg Hx    Wt Readings from Last 3 Encounters:  11/14/23 74 kg (163 lb 3.2 oz)  11/13/23 72.6 kg (160 lb)  11/01/23 74.7 kg (164 lb 9.6 oz)   BP 108/76   Pulse 69   Wt 74 kg (163 lb 3.2 oz)   SpO2 97%   BMI 28.01 kg/m   PHYSICAL EXAM: General:  Well appearing. No resp difficulty HEENT: normal Neck: supple. no JVD. Carotids 2+ bilat; no bruits. No lymphadenopathy or thryomegaly appreciated. Cor: PMI nondisplaced. Regular rate & rhythm. No rubs, gallops or murmurs. Lungs: clear Abdomen: soft, nontender, nondistended. No hepatosplenomegaly. No bruits or masses. Good bowel sounds. Extremities: no cyanosis, clubbing, rash, edema Neuro: alert & orientedx3, cranial nerves grossly intact. moves all 4 extremities w/o difficulty. Affect pleasant  ECG (personally reviewed): SB 58 No ST-T wave abnormalities.    ASSESSMENT & PLAN:  1. Chronicsystolic HF - Echo 10/03/23 EF 79-74% with global HK and moderate RV HK. Moderate MR - R/LHC 5/25: NICM, normal cors with vertically oriented path of prox LAD, well compensated hemodynamics - Pending cMRI to evaluate for infiltrative disease and cardiac CTA to evaluate LAD course - NYHA II, volume OK today, ReDs 31%. - Continue Entresto  24/26 mg  bid - Continue Lasix  20 mg PRN - Continue spiro 12.5 mg daily. - Continue Toprol  XL 50 mg daily. - Start SGLT2i soon  2. New onset AF - she is back in NSR - encouraged her to start to Eliquis (prescribed yesterday) - we gave samples  - Will place Zio x 14 days to understand burden. If high AF burden will need AAD - I have asked her to track HR with BP cuff 2x/day (Does not have SmartWatch). Call me if HR > 100.   3. HTN  - BP ok today - GDMT as above  4. Multiple sclerosis - per Neurology.  Toribio Fuel, MD  3:19 PM

## 2023-11-14 NOTE — Progress Notes (Signed)
 Zio patch placed onto patient.  All instructions and information reviewed with patient, they verbalize understanding with no questions.

## 2023-11-14 NOTE — Telephone Encounter (Signed)
 Pharmacy Patient Advocate Encounter  Received notification from [employer] that Prior Authorization for Eliquis 5MG  tablets has been APPROVED from 10/15/2023 to 11/13/2024. Ran test claim, Copay is $0.00. This test claim was processed through [facility]- copay amounts may vary at other pharmacies due to pharmacy/plan contracts, or as the patient moves through the different stages of their insurance plan.   PA #/Case ID/Reference #: [id number]

## 2023-11-14 NOTE — Patient Instructions (Signed)
 Medication Changes:  TAKE ELIQUIS 5MG  TWICE DAILY---- PLEASE PICK THIS UP FROM [facility] AT [address]   Testing/Procedures:  Your provider has recommended that  you wear a Zio Patch for 14 days.  This monitor will record your heart rhythm for our review.  IF you have any symptoms while wearing the monitor please press the button.  If you have any issues with the patch or you notice a red or orange light on it please call the company at [phone].  Once you remove the patch please mail it back to the company as soon as possible so we can get the results.   Special Instructions // Education:  PLEASE CHECK YOUR BLOOD PRESSURE TWICE DAILY   PLEASE CALL OUR OFFICE IF YOUR HEART RATE IS GREATER THAN OR EQUAL TO 100   Follow-Up in: 6 WEEKS AS SCHEDULED   At the [facility], you and your health needs are our priority. We have a designated team specialized in the treatment of Heart Failure. This Care Team includes your primary Heart Failure Specialized Cardiologist (physician), Advanced Practice Providers (APPs- Physician Assistants and Nurse Practitioners), and Pharmacist who all work together to provide you with the care you need, when you need it.   You may see any of the following providers on your designated Care Team at your next follow up:  Dr. Toribio Fuel Dr. Ezra Shuck Dr. Ria Commander Dr. Odis Brownie Greig Mosses, NP [place]affie Shed, [place] [facility] [place], [place] Beckey Coe, NP [place] Lee, NP Tinnie Redman, PharmD   Please be sure to bring in all your medications bottles to every appointment.   Need to Contact Us :  If you have any questions or concerns before your next appointment please send us  a message through [place] or call our office at [phone].    TO LEAVE A MESSAGE FOR THE NURSE SELECT OPTION 2, PLEASE LEAVE A MESSAGE INCLUDING: YOUR NAME DATE OF BIRTH CALL BACK NUMBER REASON FOR CALL**this is  important as we prioritize the call backs  YOU WILL RECEIVE A CALL BACK THE SAME DAY AS LONG AS YOU CALL BEFORE 4:00 PM

## 2023-11-14 NOTE — Telephone Encounter (Signed)
 Diagnosis added and PA has been submitted.  Pharmacy Patient Advocate Encounter   Received notification from CoverMyMeds that prior authorization for Eliquis 5MG  tablets is required/requested.   Insurance verification completed.   The patient is insured through [employer] .   Per test claim: PA required; PA submitted to above mentioned insurance via CoverMyMeds Key/confirmation #/EOC [id number] Status is pending

## 2023-11-22 ENCOUNTER — Other Ambulatory Visit (HOSPITAL_COMMUNITY)

## 2023-11-27 ENCOUNTER — Other Ambulatory Visit (HOSPITAL_COMMUNITY): Payer: Self-pay | Admitting: Family Medicine

## 2023-11-27 DIAGNOSIS — Z1231 Encounter for screening mammogram for malignant neoplasm of breast: Secondary | ICD-10-CM

## 2023-12-09 ENCOUNTER — Other Ambulatory Visit: Payer: Self-pay | Admitting: Family Medicine

## 2023-12-11 ENCOUNTER — Other Ambulatory Visit: Payer: Self-pay

## 2023-12-11 MED ORDER — FUROSEMIDE 20 MG PO TABS: 20.0000 mg | ORAL_TABLET | ORAL | Status: DC

## 2023-12-12 ENCOUNTER — Telehealth (HOSPITAL_COMMUNITY): Payer: Self-pay

## 2023-12-12 NOTE — Telephone Encounter (Signed)
 Called to confirm/remind patient of their appointment at the [facility] on 12/13/23.   Appointment:   [x] Confirmed  [] Left mess   [] No answer/No voice mail  [] VM Full/unable to leave message  [] Phone not in service  Patient reminded to bring all medications and/or complete list.  Confirmed patient has transportation. Gave directions, instructed to utilize valet parking.

## 2023-12-12 NOTE — Addendum Note (Signed)
 Encounter addended by: Debarah Garrison MATSU, RN on: 12/12/2023 8:55 AM  Actions taken: Imaging Exam ended

## 2023-12-13 ENCOUNTER — Other Ambulatory Visit (HOSPITAL_COMMUNITY): Payer: Self-pay

## 2023-12-13 ENCOUNTER — Encounter (HOSPITAL_COMMUNITY): Payer: Self-pay

## 2023-12-13 ENCOUNTER — Ambulatory Visit (HOSPITAL_COMMUNITY): Payer: Self-pay | Admitting: Family Medicine

## 2023-12-13 ENCOUNTER — Telehealth (HOSPITAL_COMMUNITY): Payer: Self-pay

## 2023-12-13 ENCOUNTER — Ambulatory Visit (HOSPITAL_COMMUNITY)
Admission: RE | Admit: 2023-12-13 | Discharge: 2023-12-13 | Disposition: A | Discharge status: Home / Self Care | Source: Ambulatory Visit | Attending: Family Medicine | Admitting: Family Medicine

## 2023-12-13 VITALS — BP 120/86 | HR 54 | Ht 64.0 in | Wt 166.0 lb

## 2023-12-13 DIAGNOSIS — I110 Hypertensive heart disease with heart failure: Secondary | ICD-10-CM | POA: Insufficient documentation

## 2023-12-13 DIAGNOSIS — I340 Nonrheumatic mitral (valve) insufficiency: Secondary | ICD-10-CM | POA: Diagnosis not present

## 2023-12-13 DIAGNOSIS — I428 Other cardiomyopathies: Secondary | ICD-10-CM | POA: Diagnosis not present

## 2023-12-13 DIAGNOSIS — E785 Hyperlipidemia, unspecified: Secondary | ICD-10-CM | POA: Insufficient documentation

## 2023-12-13 DIAGNOSIS — I5022 Chronic systolic (congestive) heart failure: Secondary | ICD-10-CM | POA: Diagnosis not present

## 2023-12-13 DIAGNOSIS — G35 Multiple sclerosis: Secondary | ICD-10-CM | POA: Diagnosis present

## 2023-12-13 DIAGNOSIS — I10 Essential (primary) hypertension: Secondary | ICD-10-CM

## 2023-12-13 DIAGNOSIS — Z7901 Long term (current) use of anticoagulants: Secondary | ICD-10-CM | POA: Diagnosis not present

## 2023-12-13 DIAGNOSIS — I4891 Unspecified atrial fibrillation: Secondary | ICD-10-CM | POA: Diagnosis not present

## 2023-12-13 DIAGNOSIS — E611 Iron deficiency: Secondary | ICD-10-CM | POA: Diagnosis not present

## 2023-12-13 DIAGNOSIS — R5383 Other fatigue: Secondary | ICD-10-CM | POA: Diagnosis present

## 2023-12-13 DIAGNOSIS — Z87891 Personal history of nicotine dependence: Secondary | ICD-10-CM | POA: Diagnosis not present

## 2023-12-13 LAB — BASIC METABOLIC PANEL WITH GFR
Anion gap: 10 (ref 5–15)
BUN: 14 mg/dL (ref 8–23)
CO2: 25 mmol/L (ref 22–32)
Calcium: 8.9 mg/dL (ref 8.9–10.3)
Chloride: 107 mmol/L (ref 98–111)
Creatinine, Ser: 0.87 mg/dL (ref 0.44–1.00)
GFR, Estimated: >60 mL/min (ref >60)
Glucose, Bld: 79 mg/dL (ref 70–99)
Potassium: 3.6 mmol/L (ref 3.5–5.1)
Sodium: 142 mmol/L (ref 135–145)

## 2023-12-13 LAB — BRAIN NATRIURETIC PEPTIDE: B Natriuretic Peptide: 216.4 pg/mL — ABNORMAL HIGH (ref 0.0–100.0)

## 2023-12-13 MED ORDER — EMPAGLIFLOZIN 10 MG PO TABS: 10.0000 mg | ORAL_TABLET | Freq: Every day | ORAL | 5 refills | Status: DC

## 2023-12-13 MED ORDER — FUROSEMIDE 20 MG PO TABS: 20.0000 mg | ORAL_TABLET | Freq: Every day | ORAL | 5 refills | Status: DC | PRN

## 2023-12-13 NOTE — Telephone Encounter (Signed)
 Advanced Heart Failure Patient Advocate Encounter  Prior authorization for Jardiance  has been submitted and approved. Test billing returns $240.18 for 30 day supply. This includes a $150 deductible, and this patient is eligible to use copay savings card to reduce cost.  Key: [id number] Effective: 11/13/2023 to 12/12/2024  Rachel DEL, CPhT Rx Patient Advocate Phone: [phone]

## 2023-12-13 NOTE — Patient Instructions (Addendum)
 Medication Changes:  START JARDIANCE  10MG  ONCE DAILY--- THIS REQUIRES A PRIOR AUTHORIZATION, PHARMACY TEAM IS WORKING ON THIS   START LASIX  (FUROSEMIDE ) 20MG  ONCE DAILY ONLY AS NEEDED  Lab Work:  Labs done today, your results will be available in MyChart, we will contact you for abnormal readings.  Referrals:  YOU HAVE BEEN REFERRED TO [facility]IAL [facility] CLINIC THEY WILL REACH OUT TO YOU OR CALL TO ARRANGE THIS. PLEASE CALL US  WITH ANY CONCERNS   Special Instructions // Education:  SCHEDULING WILL REACH OUT REGARDING IRON INFUSION TO BE SCHEDULED AFTER YOUR CARDIAC MRI ON 8/8  Follow-Up in: AS SCHEDULED WITH DR. CHERRIE   At the [facility], you and your health needs are our priority. We have a designated team specialized in the treatment of Heart Failure. This Care Team includes your primary Heart Failure Specialized Cardiologist (physician), Advanced Practice Providers (APPs- Physician Assistants and Nurse Practitioners), and Pharmacist who all work together to provide you with the care you need, when you need it.   You may see any of the following providers on your designated Care Team at your next follow up:  Dr. Toribio CHERRIE Dr. Ezra Shuck Dr. Ria Commander Dr. Odis Brownie Greig Mosses, NP [place]affie Shed, [place] [facility] [place], [place] Beckey Coe, NP [place] Lee, NP Tinnie Redman, PharmD   Please be sure to bring in all your medications bottles to every appointment.   Need to Contact Us :  If you have any questions or concerns before your next appointment please send us  a message through [place] or call our office at [phone].    TO LEAVE A MESSAGE FOR THE NURSE SELECT OPTION 2, PLEASE LEAVE A MESSAGE INCLUDING: YOUR NAME DATE OF BIRTH CALL BACK NUMBER REASON FOR CALL**this is important as we prioritize the call backs  YOU WILL RECEIVE A CALL BACK THE SAME DAY AS LONG AS YOU CALL BEFORE 4:00 PM

## 2023-12-13 NOTE — Progress Notes (Signed)
 ADVANCED HF CLINIC NOTE  Primary Care: Cook, Jayce G, DO HF Cardiologist: Dr. Cherrie  HPI: Ms Wease is a 68 y.o. woman with h/o HTN, MS, previous tobacco abuse (quit 2006 after 45 pys) and hyperlipidemia referred by Dr. Bluford for new onset HF.   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 Lasted 1 minute then resolved. Saw Dr. Bluford and was referred back to her MS neurologist. Carotid u/s <1-39% bilaterally. Had MRI 12/24 which was c/w her MS.   In March 2025 was [place] the beach and had episode of chest tightness and tried her brother's inhaler which helped. No CP since that time.  Saw Dr. Bluford on 09/12/23 for one week h/o SOB. BNP 629. Lasix  20mg  daily started and echo ordered.   Echo 10/03/23 EF 20-25% with global HK and moderate RV HK. Moderate MR  [facility] 5/25 showed normal cors (vertically oriented path of prox LAD), well-compensated hemodynamics, EF 30-35%.   Found to have AF with RVR at PCP visit 6/25. Started on Eliquis  & Toprol  increased.   2 week Zio 6/25 showed mostly NSR, 9% AF burden (longest run lasted 1 day 7 hours averaging 140 bpm).  Today she returns for HF follow up. Overall feeling bloated. Remains fatigued. Has occasional palps when doing yardwork in the heat. HR 60-90s at home. Denies abnormal bleeding, CP, dizziness, edema, or PND/Orthopnea. Appetite ok. Weight at home 166 pounds. Taking all medications, takes Lasix  ~ 2x/week. Does not snore.    Family Hx: mother passed of aortic aneurysm, had uncontrolled HTN x years, father had irregular heart rhythm  Cardiac Studies - [facility] 5/25: normal cors, vertically oriented path of proximal LAD; RA 3, PA 28/5 (17), PCWP 5, CO/CI (Fick) 5.5/3.0, PVR 2.2, PAPi 7.7.  - Echo 5/25: EF 20-25% with global HK and moderate RV HK. Moderate MR  Past Medical History:  Diagnosis Date   Allergy    Basal cell carcinoma  09/04/2019   right parotid area - Refer to [facility]   Diverticulitis    GERD (gastroesophageal reflux disease)    Hypercholesteremia    Hypertension    Insomnia    MS (multiple sclerosis) (HCC)    Vitamin D  deficiency    Current Outpatient Medications  Medication Sig Dispense Refill   apixaban  (ELIQUIS ) 5 MG TABS tablet Take 1 tablet (5 mg total) by mouth 2 (two) times daily. 60 tablet 5   baclofen  (LIORESAL ) 10 MG tablet TAKE 1 TABLET (10 MG TOTAL) BY MOUTH AT BEDTIME. MAKE TAKE AN ADDITIONAL DOSE IF NEEDED 180 tablet 5   cetirizine (ZYRTEC) 10 MG tablet Take 10 mg by mouth daily as needed for allergies.     Cholecalciferol (VITAMIN D -3) 125 MCG (5000 UT) TABS Take 1 tablet by mouth daily. 90 tablet 1   diclofenac  Sodium (VOLTAREN ) 1 % GEL Apply 2 g topically 4 (four) times daily as needed (pain.).     furosemide  (LASIX ) 20 MG tablet Take 1 tablet (20 mg total) by mouth every Monday, Wednesday, and Friday.     imipramine  (TOFRANIL ) 25 MG tablet TAKE 1 TO 2 TABLETS (25 TO 50 MG TOTAL) BY MOUTH AT BEDTIME 180 tablet 1   Lidocaine  (BLUE-EMU PAIN RELIEF DRY EX) Apply 1 application  topically 2 (two) times daily as needed (pain.).     metoprolol  succinate (TOPROL  XL) 50 MG 24 hr tablet Take 1 tablet (50 mg  total) by mouth daily. 90 tablet 3   pantoprazole  (PROTONIX ) 40 MG tablet Take 1 tablet (40 mg total) by mouth 2 (two) times daily before a meal. 180 tablet 2   potassium chloride  (KLOR-CON  M10) 10 MEQ tablet Take 3 tablets (30 mEq total) by mouth daily. 270 tablet 3   Probiotic Product (PROBIOTIC DAILY PO) Take 2-3 capsules by mouth daily.     sacubitril-valsartan (ENTRESTO ) 24-26 MG Take 1 tablet by mouth 2 (two) times daily. 90 tablet 2   sertraline  (ZOLOFT ) 100 MG tablet TAKE 1 TABLET BY MOUTH EVERY DAY 90 tablet 2   simvastatin  (ZOCOR ) 40 MG tablet TAKE 1 TABLET BY MOUTH EVERYDAY AT BEDTIME 90 tablet 1   spironolactone  (ALDACTONE ) 25 MG tablet Take 0.5 tablets (12.5 mg total) by mouth  daily. 90 tablet 3   No current facility-administered medications for this encounter.   Allergies  Allergen Reactions   Cefzil  [Cefprozil ] Diarrhea   Codeine Nausea And Vomiting   Penicillins Rash   Sulfonamide Derivatives Rash   Social History   Socioeconomic History   Marital status: Divorced    Spouse name: Not on file   Number of children: Not on file   Years of education: 48   Highest education level: Master's degree (e.g., MA, MS, MEng, MEd, MSW, MBA)  Occupational History   Occupation: work   Tobacco Use   Smoking status: Former    Current packs/day: 0.00    Average packs/day: 1.5 packs/day for 30.0 years (45.0 ttl pk-yrs)    Types: Cigarettes    Start date: 12/26/1974    Quit date: 06/22/2004    Years since quitting: 19.4   Smokeless tobacco: Never  Vaping Use   Vaping status: Never Used  Substance and Sexual Activity   Alcohol use: Yes    Alcohol/week: 1.0 standard drink of alcohol    Types: 1 Cans of beer per week    Comment: rare social   Drug use: No   Sexual activity: Not Currently  Other Topics Concern   Not on file  Social History Narrative   Coffee in the morning couple of cups    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[employer]   Social Drivers of Health   Financial Resource Strain: Low Risk  (11/12/2023)   Overall Financial Resource Strain (CARDIA)    Difficulty of Paying Living Expenses: Not very hard  Food Insecurity: No Food Insecurity (11/12/2023)   Hunger Vital Sign    Worried About Running Out of Food in the Last Year: Never true    Ran Out of Food in the Last Year: Never true  Transportation Needs: No Transportation Needs (11/12/2023)   PRAPARE - Administrator, Civil Service (Medical): No    Lack of Transportation (Non-Medical): No  Physical Activity: Insufficiently Active (11/12/2023)   Exercise Vital Sign    Days of Exercise per Week: 3 days    Minutes of Exercise per Session: 20 min  Stress:  Stress Concern Present (11/12/2023)   [employer] of Occupational Health - Occupational Stress Questionnaire    Feeling of Stress: To some extent  Social Connections: Socially Isolated (11/12/2023)   Social Connection and Isolation Panel    Frequency of Communication with Friends and Family: Once a week    Frequency of Social Gatherings with Friends and Family: Once a week    Attends Religious Services: Never    Database administrator or Organizations: No  Attends Banker Meetings: Not on file    Marital Status: Divorced  Catering manager Violence: Not on file   Family History  Problem Relation Age of Onset   Hypertension Mother    Hyperlipidemia Father    Hearing loss Father    Hyperlipidemia Brother    Diabetes Brother    Vision loss Maternal Grandmother    Vision loss Paternal Grandmother    Cancer Other        breast   Inflammatory bowel disease Neg Hx    Colon cancer Neg Hx    Ulcerative colitis Neg Hx    Crohn's disease Neg Hx    Wt Readings from Last 3 Encounters:  12/13/23 75.3 kg (166 lb)  11/14/23 74 kg (163 lb 3.2 oz)  11/13/23 72.6 kg (160 lb)   BP 120/86   Pulse (!) 54   Ht 5' 4 (1.626 m)   Wt 75.3 kg (166 lb)   SpO2 99%   BMI 28.49 kg/m   PHYSICAL EXAM: General:  NAD. No resp difficulty, walked into clinic HEENT: Normal Neck: Supple. No JVD. Cor: Regular rate & rhythm. No rubs, gallops or murmurs. Lungs: Clear Abdomen: Soft, nontender, nondistended.  Extremities: No cyanosis, clubbing, rash, edema Neuro: Alert & oriented x 3, moves all 4 extremities w/o difficulty. Affect pleasant.   ASSESSMENT & PLAN: 1. Chronicsystolic HF - NICM - Echo 10/03/23 EF 20-25% with global HK and moderate RV HK. Moderate MR - R/LHC 5/25: NICM, normal cors with vertically oriented path of prox LAD, well compensated hemodynamics - Pending cMRI to evaluate for infiltrative disease  - cardiac CTA to evaluate LAD course, showed normal coronary origins  with right dominance, normal course of LAD; CADRADs 0 - NYHA II, limited more by fatigue. Volume OK today on exam but weight up a bit. - Start Jardiance  10 mg daily. Discussed potential SE - Change Lasix  to 20 mg PRN - Continue Entresto  24/26 mg bid - Continue spiro 12.5 mg daily. - Continue Toprol  XL 50 mg daily. - Labs today.  2. New onset AF - she is back in NSR, regular on exam today. - 2 week Zio 6/25 showed 9% AF burden - Continue Eliquis  5 mg bid. - Low AF burden on Zio. She is on imipramine  (mood has been well-controlled on this x years), but can prolong QTc when adding amiodarone. Not sure benefit > risk at this time. - Will refer to AF clinic to discuss risks/benefits of addition of AAD vs watchful waiting. - Continue to track HR with BP cuff 2x/day (Does not have SmartWatch). Call us  if HR > 100.   3. HTN  - BP well-controlled today. - GDMT as above  4. Multiple sclerosis - per Neurology.  5. Iron Deficiency  - Tsat 10% and ferritin 29 - Arrange iron infusion, after her cMRI (scheduled on 12/29/23)  Keep follow up in 2 months with Dr. Bensimhon, as scheduled.  Harlene CHRISTELLA Gainer, FNP  9:10 AM

## 2023-12-20 ENCOUNTER — Ambulatory Visit (HOSPITAL_COMMUNITY)
Admission: RE | Admit: 2023-12-20 | Discharge: 2023-12-20 | Disposition: A | Discharge status: Home / Self Care | Source: Ambulatory Visit | Attending: Family Medicine | Admitting: Family Medicine

## 2023-12-20 ENCOUNTER — Ambulatory Visit: Payer: Self-pay | Admitting: Family Medicine

## 2023-12-20 DIAGNOSIS — Z1231 Encounter for screening mammogram for malignant neoplasm of breast: Secondary | ICD-10-CM | POA: Diagnosis present

## 2023-12-20 DIAGNOSIS — Z780 Asymptomatic menopausal state: Secondary | ICD-10-CM | POA: Diagnosis present

## 2023-12-21 ENCOUNTER — Encounter (INDEPENDENT_AMBULATORY_CARE_PROVIDER_SITE_OTHER): Payer: Self-pay

## 2023-12-26 ENCOUNTER — Encounter (HOSPITAL_COMMUNITY): Payer: Self-pay

## 2023-12-29 ENCOUNTER — Ambulatory Visit (HOSPITAL_COMMUNITY): Admission: RE | Admit: 2023-12-29 | Source: Ambulatory Visit

## 2024-01-04 ENCOUNTER — Other Ambulatory Visit: Payer: Self-pay | Admitting: Neurology

## 2024-01-04 NOTE — Telephone Encounter (Signed)
 Last seen on 08/24/23 Follow up scheduled on 04/04/24    Dispensed Days Supply Quantity Provider Pharmacy  IMIPRAMINE HCL 25 MG TABLET 10/09/2023 90 180 each Vear Charlie LABOR, MD [facility] [phone] -

## 2024-01-05 ENCOUNTER — Ambulatory Visit (HOSPITAL_COMMUNITY)
Admission: RE | Admit: 2024-01-05 | Discharge: 2024-01-05 | Disposition: A | Discharge status: Home / Self Care | Source: Ambulatory Visit | Attending: Physician Assistant | Admitting: Physician Assistant

## 2024-01-05 VITALS — BP 120/76 | HR 51 | Ht 64.0 in | Wt 166.6 lb

## 2024-01-05 DIAGNOSIS — I480 Paroxysmal atrial fibrillation: Secondary | ICD-10-CM

## 2024-01-05 DIAGNOSIS — I4891 Unspecified atrial fibrillation: Secondary | ICD-10-CM | POA: Diagnosis not present

## 2024-01-05 DIAGNOSIS — D6869 Other thrombophilia: Secondary | ICD-10-CM

## 2024-01-05 NOTE — Patient Instructions (Addendum)
Afib ablation

## 2024-01-05 NOTE — Progress Notes (Signed)
 Primary Care Physician: Cook, Jayce G, DO Primary Cardiologist: Toribio Fuel, MD Electrophysiologist: None  Referring Physician: Harlene Gainer NP   Krystal Silva is a 68 y.o. female with a history of HTN, MS, previous tobacco use, HLD, CHF, atrial fibrillation who presents for follow up in the [facility].  The patient was initially diagnosed with atrial fibrillation 11/13/23 at a visit with her PCP. She was started on Eliquis for stroke prevention and a two week monitor was placed to evaluate burden. The monitor showed 9% afib burden with an average HR of 140 bpm.      Patient presents today for follow up for atrial fibrillation. She is in SR today. She does report waxing and waning fatigue. No bleeding issues on anticoagulation. She denies significant snoring or alcohol use.   Today, she denies symptoms of palpitations, chest pain, orthopnea, PND, lower extremity edema, dizziness, presyncope, syncope, snoring, daytime somnolence, bleeding, or neurologic sequela. The patient is tolerating medications without difficulties and is otherwise without complaint today.    Atrial Fibrillation Risk Factors:  she does not have symptoms or diagnosis of sleep apnea. she does not have a history of rheumatic fever. she does not have a history of alcohol use. The patient does not have a history of early familial atrial fibrillation or other arrhythmias.  Atrial Fibrillation Management history:  Previous antiarrhythmic drugs: none Previous cardioversions: none Previous ablations: none Anticoagulation history: Eliquis  ROS- All systems are reviewed and negative except as per the HPI above.  Past Medical History:  Diagnosis Date   Allergy    Basal cell carcinoma 09/04/2019   right parotid area - Refer to [facility]   Diverticulitis    GERD (gastroesophageal reflux disease)    Hypercholesteremia    Hypertension    Insomnia    MS (multiple sclerosis) (HCC)     Vitamin D deficiency     Current Outpatient Medications  Medication Sig Dispense Refill   apixaban (ELIQUIS) 5 MG TABS tablet Take 1 tablet (5 mg total) by mouth 2 (two) times daily. 60 tablet 5   baclofen (LIORESAL) 10 MG tablet TAKE 1 TABLET (10 MG TOTAL) BY MOUTH AT BEDTIME. MAKE TAKE AN ADDITIONAL DOSE IF NEEDED 180 tablet 5   cetirizine (ZYRTEC) 10 MG tablet Take 10 mg by mouth daily as needed for allergies. (Patient taking differently: Take 10 mg by mouth as needed for allergies.)     Cholecalciferol (VITAMIN D-3) 125 MCG (5000 UT) TABS Take 1 tablet by mouth daily. 90 tablet 1   diclofenac Sodium (VOLTAREN) 1 % GEL Apply 2 g topically 4 (four) times daily as needed (pain.).     empagliflozin (JARDIANCE) 10 MG TABS tablet Take 1 tablet (10 mg total) by mouth daily before breakfast. 30 tablet 5   furosemide (LASIX) 20 MG tablet Take 1 tablet (20 mg total) by mouth daily as needed for edema or fluid. (Patient taking differently: Take 20 mg by mouth as needed for edema or fluid.) 30 tablet 5   imipramine (TOFRANIL) 25 MG tablet TAKE 1 TO 2 TABLETS (25 TO 50 MG TOTAL) BY MOUTH AT BEDTIME 180 tablet 0   Lidocaine (BLUE-EMU PAIN RELIEF DRY EX) Apply 1 application  topically 2 (two) times daily as needed (pain.).     metoprolol succinate (TOPROL XL) 50 MG 24 hr tablet Take 1 tablet (50 mg total) by mouth daily. 90 tablet 3   pantoprazole (PROTONIX) 40 MG tablet Take 1 tablet (40  mg total) by mouth 2 (two) times daily before a meal. 180 tablet 2   potassium chloride (KLOR-CON M10) 10 MEQ tablet Take 3 tablets (30 mEq total) by mouth daily. 270 tablet 3   Probiotic Product (PROBIOTIC DAILY PO) Take 2-3 capsules by mouth daily.     sacubitril-valsartan (ENTRESTO) 24-26 MG Take 1 tablet by mouth 2 (two) times daily. 90 tablet 2   sertraline (ZOLOFT) 100 MG tablet TAKE 1 TABLET BY MOUTH EVERY DAY 90 tablet 2   simvastatin (ZOCOR) 40 MG tablet TAKE 1 TABLET BY MOUTH EVERYDAY AT BEDTIME 90 tablet 1    spironolactone (ALDACTONE) 25 MG tablet Take 0.5 tablets (12.5 mg total) by mouth daily. 90 tablet 3   No current facility-administered medications for this encounter.    Physical Exam: BP 120/76   Pulse (!) 51   Ht 5' 4 (1.626 m)   Wt 75.6 kg   BMI 28.60 kg/m   GEN: Well nourished, well developed in no acute distress CARDIAC: Regular rate and rhythm, no murmurs, rubs, gallops RESPIRATORY:  Clear to auscultation without rales, wheezing or rhonchi  ABDOMEN: Soft, non-tender, non-distended EXTREMITIES:  No edema; No deformity   Wt Readings from Last 3 Encounters:  01/05/24 75.6 kg  12/13/23 75.3 kg  11/14/23 74 kg     EKG today demonstrates  SB Vent. rate 51 BPM PR interval 156 ms QRS duration 82 ms QT/QTcB 460/423 ms   Echo 10/03/23 demonstrated   1. Left ventricular ejection fraction, by estimation, is 20 to 25%. Left  ventricular ejection fraction by 3D volume is 20 %. The left ventricle has  severely decreased function. The left ventricle demonstrates global  hypokinesis. The left ventricular internal cavity size was mildly dilated. Left ventricular diastolic parameters are consistent with Grade II diastolic dysfunction (pseudonormalization).   2. Right ventricular systolic function is moderately reduced. The right  ventricular size is normal. There is normal pulmonary artery systolic  pressure.   3. Left atrial size was moderately dilated.   4. The mitral valve is normal in structure. Moderate mitral valve  regurgitation. No evidence of mitral stenosis.   5. The aortic valve is normal in structure. Aortic valve regurgitation is  trivial. Aortic valve sclerosis/calcification is present, without any  evidence of aortic stenosis.   6. The inferior vena cava is normal in size with greater than 50%  respiratory variability, suggesting right atrial pressure of 3 mmHg.   7. Agitated saline contrast bubble study was negative, with no evidence  of any interatrial shunt.     CHA2DS2-VASc Score = 4  The patient's score is based upon: CHF History: 1 HTN History: 1 Diabetes History: 0 Stroke History: 0 Vascular Disease History: 0 Age Score: 1 Gender Score: 1       ASSESSMENT AND PLAN: Paroxysmal Atrial Fibrillation (ICD10:  I48.0) The patient's CHA2DS2-VASc score is 4, indicating a 4.8% annual risk of stroke.   2 week monitor showed 9% afib burden with rapid rates. We discussed rhythm control options today. Unfortunately, AAD options are limited with low EF and interactions with imipramine (takes for depression and really does not want to change). We discussed afib ablation and she is hesitant but agreeable to a consultation. No room in HR to increase rate control.  Continue Eliquis 5 mg BID Continue Toprol 50 mg daily  Secondary Hypercoagulable State (ICD10:  D68.69) The patient is at significant risk for stroke/thromboembolism based upon her CHA2DS2-VASc Score of 4.  Continue Apixaban (Eliquis). No  bleeding issues.   Chronic HFrEF EF 20-25% No CAD on LHC, has MRI scheduled to look for infiltrative disease. GDMT per [facility] team Fluid status appears stable today  HTN Stable on current regimen   Follow up with EP to establish care and discuss ablation. (Patient requested appointment in [facility])       [facility] [address] [place], [place] [address] [phone]

## 2024-01-20 ENCOUNTER — Encounter (HOSPITAL_COMMUNITY): Payer: Self-pay

## 2024-01-24 ENCOUNTER — Ambulatory Visit (HOSPITAL_COMMUNITY)
Admission: RE | Admit: 2024-01-24 | Discharge: 2024-01-24 | Disposition: A | Discharge status: Home / Self Care | Source: Ambulatory Visit | Attending: Family Medicine | Admitting: Family Medicine

## 2024-01-24 ENCOUNTER — Other Ambulatory Visit (HOSPITAL_COMMUNITY): Payer: Self-pay | Admitting: Family Medicine

## 2024-01-24 DIAGNOSIS — I5022 Chronic systolic (congestive) heart failure: Secondary | ICD-10-CM | POA: Diagnosis present

## 2024-01-24 MED ORDER — GADOBUTROL 1 MMOL/ML IV SOLN
10.0000 mL | Freq: Once | INTRAVENOUS | Status: AC | PRN
  Administered 2024-01-24: 10 mL via INTRAVENOUS

## 2024-01-26 NOTE — Telephone Encounter (Addendum)
 Pt aware, agreeable, and verbalized understanding   ----- Message from Krystal Silva sent at 01/26/2024  8:14 AM EDT ----- LVEF improving, now up to 44%! GOOD NEWS! RVEF 45%. No evidence of LGE, scar or evidence of infiltrative disease. Please call with news ----- Message ----- From: Interface, Rad Results In Sent: 01/25/2024  10:27 AM EDT To: Krystal CHRISTELLA Gainer, FNP

## 2024-01-30 ENCOUNTER — Telehealth: Payer: Self-pay | Admitting: Internal Medicine

## 2024-01-30 ENCOUNTER — Telehealth: Payer: Self-pay

## 2024-01-30 ENCOUNTER — Telehealth (HOSPITAL_COMMUNITY): Payer: Self-pay

## 2024-01-30 DIAGNOSIS — D509 Iron deficiency anemia, unspecified: Secondary | ICD-10-CM | POA: Insufficient documentation

## 2024-01-30 DIAGNOSIS — E611 Iron deficiency: Secondary | ICD-10-CM

## 2024-01-30 NOTE — Telephone Encounter (Signed)
 Patient referred to infusion pharmacy team for ambulatory infusion of IV iron.  Insurance - UMR/[employer]  Site of care - Site of care: [facility] Dx code - E61.1/D50.9  IV Iron Therapy - Venofer 300 mg IV x 3  Infusion appointments - Scheduling team will schedule patient as soon as possible.    Nimco Bivens D. Berdie Malter, PharmD

## 2024-01-30 NOTE — Telephone Encounter (Signed)
-----   Message from Nurse Andriette B sent at 01/29/2024 11:48 AM EDT ----- Regarding: RE: Iron infusion Yes, does not look like she had it ----- Message ----- From: Alvan Fausto SAUNDERS, CMA Sent: 01/29/2024  11:40 AM EDT To: Jenna B Bullins, RN Subject: RE: Iron infusion                              Does patient need iron infusion? ----- Message ----- From: Bullins, Jenna B, RN Sent: 12/13/2023   9:48 AM EDT To: Fausto SAUNDERS Alvan, CMA Subject: Iron infusion                                  Hi,   This patient needs iron infusion after her cardiac MRI on 8/8--- can you help arrange?    Let me know and then I can place orders.   Thank you! Jenna B.

## 2024-01-30 NOTE — Telephone Encounter (Signed)
 Auth Submission: NO AUTH NEEDED Site of care: Site of care: [facility] Payer: [employer] p[employer] Medication & CPT/J Code(s) submitted: Venofer (Iron Sucrose) J1756 Diagnosis Code:  Route of submission (phone, fax, portal): portal Phone # Fax # Auth type: Buy/Bill PB Units/visits requested: 300mg  x 3 doses Reference number:  Approval from: 01/30/24 to 05/22/24

## 2024-02-01 ENCOUNTER — Ambulatory Visit (HOSPITAL_COMMUNITY)
Admission: RE | Admit: 2024-02-01 | Discharge: 2024-02-01 | Disposition: A | Discharge status: Home / Self Care | Source: Ambulatory Visit | Attending: Internal Medicine | Admitting: Internal Medicine

## 2024-02-01 ENCOUNTER — Ambulatory Visit (HOSPITAL_BASED_OUTPATIENT_CLINIC_OR_DEPARTMENT_OTHER)
Admission: RE | Admit: 2024-02-01 | Discharge: 2024-02-01 | Disposition: A | Discharge status: Home / Self Care | Source: Ambulatory Visit | Attending: Internal Medicine | Admitting: Internal Medicine

## 2024-02-01 ENCOUNTER — Encounter (HOSPITAL_COMMUNITY): Payer: Self-pay | Admitting: Internal Medicine

## 2024-02-01 VITALS — BP 102/60 | HR 72 | Wt 165.0 lb

## 2024-02-01 DIAGNOSIS — E7800 Pure hypercholesterolemia, unspecified: Secondary | ICD-10-CM | POA: Diagnosis not present

## 2024-02-01 DIAGNOSIS — I428 Other cardiomyopathies: Secondary | ICD-10-CM | POA: Insufficient documentation

## 2024-02-01 DIAGNOSIS — Z79899 Other long term (current) drug therapy: Secondary | ICD-10-CM | POA: Insufficient documentation

## 2024-02-01 DIAGNOSIS — Z7901 Long term (current) use of anticoagulants: Secondary | ICD-10-CM | POA: Diagnosis not present

## 2024-02-01 DIAGNOSIS — Z7984 Long term (current) use of oral hypoglycemic drugs: Secondary | ICD-10-CM | POA: Insufficient documentation

## 2024-02-01 DIAGNOSIS — I5022 Chronic systolic (congestive) heart failure: Secondary | ICD-10-CM | POA: Insufficient documentation

## 2024-02-01 DIAGNOSIS — I4891 Unspecified atrial fibrillation: Secondary | ICD-10-CM

## 2024-02-01 DIAGNOSIS — I5020 Unspecified systolic (congestive) heart failure: Secondary | ICD-10-CM | POA: Diagnosis not present

## 2024-02-01 DIAGNOSIS — G35 Multiple sclerosis: Secondary | ICD-10-CM | POA: Diagnosis not present

## 2024-02-01 DIAGNOSIS — I480 Paroxysmal atrial fibrillation: Secondary | ICD-10-CM | POA: Insufficient documentation

## 2024-02-01 DIAGNOSIS — Z87891 Personal history of nicotine dependence: Secondary | ICD-10-CM | POA: Diagnosis not present

## 2024-02-01 DIAGNOSIS — I110 Hypertensive heart disease with heart failure: Secondary | ICD-10-CM | POA: Diagnosis not present

## 2024-02-01 LAB — ECHOCARDIOGRAM COMPLETE
AR max vel: 1.65 cm2
AV Area VTI: 1.64 cm2
AV Area mean vel: 1.55 cm2
AV Mean grad: 6 mmHg
AV Peak grad: 8.9 mmHg
Ao pk vel: 1.49 m/s
Area-P 1/2: 7.37 cm2
S' Lateral: 3.3 cm

## 2024-02-01 LAB — TSH: TSH: 3.04 u[IU]/mL (ref 0.350–4.500)

## 2024-02-01 LAB — BASIC METABOLIC PANEL WITH GFR
Anion gap: 10 (ref 5–15)
BUN: 15 mg/dL (ref 8–23)
CO2: 24 mmol/L (ref 22–32)
Calcium: 9.1 mg/dL (ref 8.9–10.3)
Chloride: 106 mmol/L (ref 98–111)
Creatinine, Ser: 1.16 mg/dL — ABNORMAL HIGH (ref 0.44–1.00)
GFR, Estimated: 52 mL/min — ABNORMAL LOW (ref >60)
Glucose, Bld: 119 mg/dL — ABNORMAL HIGH (ref 70–99)
Potassium: 3.3 mmol/L — ABNORMAL LOW (ref 3.5–5.1)
Sodium: 140 mmol/L (ref 135–145)

## 2024-02-01 LAB — CBC
HCT: 47.6 % — ABNORMAL HIGH (ref 36.0–46.0)
Hemoglobin: 16.1 g/dL — ABNORMAL HIGH (ref 12.0–15.0)
MCH: 30 pg (ref 26.0–34.0)
MCHC: 33.8 g/dL (ref 30.0–36.0)
MCV: 88.6 fL (ref 80.0–100.0)
Platelets: 319 K/uL (ref 150–400)
RBC: 5.37 MIL/uL — ABNORMAL HIGH (ref 3.87–5.11)
RDW: 13.3 % (ref 11.5–15.5)
WBC: 9.5 K/uL (ref 4.0–10.5)
nRBC: 0 % (ref 0.0–0.2)

## 2024-02-01 MED ORDER — AMIODARONE HCL 200 MG PO TABS: 200.0000 mg | ORAL_TABLET | Freq: Two times a day (BID) | ORAL | 1 refills | Status: DC

## 2024-02-01 NOTE — Patient Instructions (Addendum)
 Medication Changes:  START AMIODARONE  200MG  TWICE DAILY   Lab Work:  Labs done today, your results will be available in MyChart, we will contact you for abnormal readings.  Testing/Procedures:  NOTHING TO EAT OR DRINK AFTER MIDNIGHT TONIGHT---PLEASE CALL US  IN THE MORNING, OR WE WILL CALL YOU TO CHECK IN AND SEE HOW YOUR HEART RATE IS   Follow-Up in: 2 WEEKS WITH AFIB CLINIC   THEN 2 MONTHS AS SCHEDULED WITH DR. CHERRIE   MESSAGE SENT TO ELECTROPHYSIOLOGY FOR THEM TO GET YOU SCHEDULED FOR CONSULT   At the [facility], you and your health needs are our priority. We have a designated team specialized in the treatment of Heart Failure. This Care Team includes your primary Heart Failure Specialized Cardiologist (physician), Advanced Practice Providers (APPs- Physician Assistants and Nurse Practitioners), and Pharmacist who all work together to provide you with the care you need, when you need it.   You may see any of the following providers on your designated Care Team at your next follow up:  Dr. Toribio CHERRIE Dr. Ezra Shuck Dr. Ria Commander Dr. Odis Brownie Greig Mosses, NP Caffie Shed, [place] [facility] [place], [place] Beckey Coe, NP [place] Lee, NP Tinnie Redman, PharmD   Please be sure to bring in all your medications bottles to every appointment.   Need to Contact Us :  If you have any questions or concerns before your next appointment please send us  a message through Magnet or call our office at [phone].    TO LEAVE A MESSAGE FOR THE NURSE SELECT OPTION 2, PLEASE LEAVE A MESSAGE INCLUDING: YOUR NAME DATE OF BIRTH CALL BACK NUMBER REASON FOR CALL**this is important as we prioritize the call backs  YOU WILL RECEIVE A CALL BACK THE SAME DAY AS LONG AS YOU CALL BEFORE 4:00 PM

## 2024-02-01 NOTE — Progress Notes (Signed)
 ADVANCED HF CLINIC NOTE  Primary Care: Cook, Jayce G, DO HF Cardiologist: Dr. Cherrie  HPI:  Ms Donovan is a 68 y.o. woman with h/o HTN, MS, previous tobacco abuse (quit 2006 after 45 pys) and hyperlipidemia referred by Dr. Bluford for new onset HF.   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 Lasted 1 minute then resolved. Saw Dr. Bluford and was referred back to her MS neurologist. Carotid u/s <1-39% bilaterally. Had MRI 12/24 which was c/w her MS.   In March 2025 [place] the beach and had episode of chest tightness and tried her brother's inhaler which helped. No CP since that time.  Saw Dr. Bluford on 09/12/23 for one week h/o SOB. BNP 629. Lasix  20mg  daily started and echo ordered.   Echo 10/03/23 EF 20-25% with global HK and moderate RV HK. Moderate MR  [facility] 5/25 showed normal cors (vertically oriented path of prox LAD), well-compensated hemodynamics, EF 30-35%.   Seen by PCP yesterday found to be in new AF with RVR. Started on Eliquis  & Toprol  increased. Referred here.  She is back to work. Still with exertional fatigue and mild DOE. No edema, orthopnea or PND. Did not clearly feel AF. Denies snoring.   Has been follwoed by AF Clinic. Zio in 7/25 with 9% AF burden   cMRI 9/25 EF 44% RV EF 45% No LGE  Here for f/u. Says she felt she went back into AF last night. Feels more tired + palpitations   Echo today in setting of AF with RVR EF 25-30% Personally reviewed   Family Hx: mother passed of aortic aneurysm, had uncontrolled HTN x years, father had irregular heart rhythm  Cardiac Studies - [facility] 5/25: normal cors, vertically oriented path of proximal LAD; RA 3, PA 28/5 (17), PCWP 5, CO/CI (Fick) 5.5/3.0, PVR 2.2, PAPi 7.7.  - Echo 5/25: EF 20-25% with global HK and moderate RV HK. Moderate MR  Past Medical History:  Diagnosis Date   Allergy    Basal cell carcinoma  09/04/2019   right parotid area - Refer to [facility]   Diverticulitis    GERD (gastroesophageal reflux disease)    Hypercholesteremia    Hypertension    Insomnia    MS (multiple sclerosis) (HCC)    Vitamin D  deficiency    Current Outpatient Medications  Medication Sig Dispense Refill   apixaban  (ELIQUIS ) 5 MG TABS tablet Take 1 tablet (5 mg total) by mouth 2 (two) times daily. 60 tablet 5   baclofen  (LIORESAL ) 10 MG tablet TAKE 1 TABLET (10 MG TOTAL) BY MOUTH AT BEDTIME. MAKE TAKE AN ADDITIONAL DOSE IF NEEDED 180 tablet 5   cetirizine (ZYRTEC) 10 MG tablet Take 10 mg by mouth daily as needed for allergies.     Cholecalciferol (VITAMIN D -3) 125 MCG (5000 UT) TABS Take 1 tablet by mouth daily. 90 tablet 1   diclofenac  Sodium (VOLTAREN ) 1 % GEL Apply 2 g topically 4 (four) times daily as needed (pain.).     empagliflozin  (JARDIANCE ) 10 MG TABS tablet Take 1 tablet (10 mg total) by mouth daily before breakfast. 30 tablet 5   furosemide  (LASIX ) 20 MG tablet Take 1 tablet (20 mg total) by mouth daily as needed for edema or fluid. 30 tablet 5   imipramine  (TOFRANIL ) 25 MG tablet TAKE 1 TO 2 TABLETS (25 TO 50 MG TOTAL) BY MOUTH AT BEDTIME 180  tablet 0   Lidocaine  (BLUE-EMU PAIN RELIEF DRY EX) Apply 1 application  topically 2 (two) times daily as needed (pain.).     metoprolol  succinate (TOPROL  XL) 50 MG 24 hr tablet Take 1 tablet (50 mg total) by mouth daily. 90 tablet 3   pantoprazole  (PROTONIX ) 40 MG tablet Take 1 tablet (40 mg total) by mouth 2 (two) times daily before a meal. 180 tablet 2   potassium chloride  (KLOR-CON  M10) 10 MEQ tablet Take 3 tablets (30 mEq total) by mouth daily. 270 tablet 3   Probiotic Product (PROBIOTIC DAILY PO) Take 2-3 capsules by mouth daily.     sacubitril-valsartan (ENTRESTO ) 24-26 MG Take 1 tablet by mouth 2 (two) times daily. 90 tablet 2   sertraline  (ZOLOFT ) 100 MG tablet TAKE 1 TABLET BY MOUTH EVERY DAY 90 tablet 2   simvastatin  (ZOCOR ) 40 MG tablet TAKE 1 TABLET BY  MOUTH EVERYDAY AT BEDTIME 90 tablet 1   spironolactone  (ALDACTONE ) 25 MG tablet Take 0.5 tablets (12.5 mg total) by mouth daily. 90 tablet 3   No current facility-administered medications for this encounter.   Allergies  Allergen Reactions   Cefzil  [Cefprozil ] Diarrhea   Codeine Nausea And Vomiting   Penicillins Rash   Sulfonamide Derivatives Rash   Social History   Socioeconomic History   Marital status: Divorced    Spouse name: Not on file   Number of children: Not on file   Years of education: 27   Highest education level: Master's degree (e.g., MA, MS, MEng, MEd, MSW, MBA)  Occupational History   Occupation: work   Tobacco Use   Smoking status: Former    Current packs/day: 0.00    Average packs/day: 1.5 packs/day for 30.0 years (45.0 ttl pk-yrs)    Types: Cigarettes    Start date: 12/26/1974    Quit date: 06/22/2004    Years since quitting: 19.6   Smokeless tobacco: Never  Vaping Use   Vaping status: Never Used  Substance and Sexual Activity   Alcohol use: Yes    Alcohol/week: 1.0 standard drink of alcohol    Types: 1 Cans of beer per week    Comment: rare social   Drug use: No   Sexual activity: Not Currently  Other Topics Concern   Not on file  Social History Narrative   Coffee in the morning couple of cups    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[employer]   Social Drivers of Health   Financial Resource Strain: Low Risk  (11/12/2023)   Overall Financial Resource Strain (CARDIA)    Difficulty of Paying Living Expenses: Not very hard  Food Insecurity: No Food Insecurity (11/12/2023)   Hunger Vital Sign    Worried About Running Out of Food in the Last Year: Never true    Ran Out of Food in the Last Year: Never true  Transportation Needs: No Transportation Needs (11/12/2023)   PRAPARE - Administrator, Civil Service (Medical): No    Lack of Transportation (Non-Medical): No  Physical Activity: Insufficiently Active  (11/12/2023)   Exercise Vital Sign    Days of Exercise per Week: 3 days    Minutes of Exercise per Session: 20 min  Stress: Stress Concern Present (11/12/2023)   [employer] of Occupational Health - Occupational Stress Questionnaire    Feeling of Stress: To some extent  Social Connections: Socially Isolated (11/12/2023)   Social Connection and Isolation Panel    Frequency  of Communication with Friends and Family: Once a week    Frequency of Social Gatherings with Friends and Family: Once a week    Attends Religious Services: Never    Database administrator or Organizations: No    Attends Engineer, structural: Not on file    Marital Status: Divorced  Catering manager Violence: Not on file    Family History  Problem Relation Age of Onset   Hypertension Mother    Hyperlipidemia Father    Hearing loss Father    Hyperlipidemia Brother    Diabetes Brother    Vision loss Maternal Grandmother    Vision loss Paternal Grandmother    Cancer Other        breast   Inflammatory bowel disease Neg Hx    Colon cancer Neg Hx    Ulcerative colitis Neg Hx    Crohn's disease Neg Hx    Wt Readings from Last 3 Encounters:  02/01/24 74.8 kg (165 lb)  01/05/24 75.6 kg (166 lb 9.6 oz)  12/13/23 75.3 kg (166 lb)   BP 102/60   Wt 74.8 kg (165 lb)   SpO2 98%   BMI 28.32 kg/m   PHYSICAL EXAM: General:  Well appearing. No resp difficulty HEENT: normal Neck: supple. no JVD. Carotids 2+ bilat; no bruits. No lymphadenopathy or thryomegaly appreciated. Cor: PMI nondisplaced. Regular rate & rhythm. No rubs, gallops or murmurs. Lungs: clear Abdomen: soft, nontender, nondistended. No hepatosplenomegaly. No bruits or masses. Good bowel sounds. Extremities: no cyanosis, clubbing, rash, edema Neuro: alert & orientedx3, cranial nerves grossly intact. moves all 4 extremities w/o difficulty. Affect pleasant  ECG (personally reviewed): AF 154 Personally reviewed    ASSESSMENT & PLAN:  1.  Chronicsystolic HF - Echo 10/03/23 EF 79-74% with global HK and moderate RV HK. Moderate MR - R/LHC 5/25: NICM, normal cors with vertically oriented path of prox LAD, well compensated hemodynamics - cMRI 9/25 EF 44% RV EF 45% No LGE - NYHA II, volume OK today - Continue Entresto  24/26 mg bid - Continue Lasix  20 mg PRN - Continue spiro 12.5 mg daily. - Continue Toprol  XL 50 mg daily. - Start SGLT2i soon  2. PAF - followed by AF clinic - Zio 7/25 AF burden 9%  - Continue ELiquis  - Pending eval with EP for AF ablation   3. HTN  - BP runs low   4. Multiple sclerosis - per Neurology.  Toribio Fuel, MD  2:40 PM

## 2024-02-02 ENCOUNTER — Ambulatory Visit (HOSPITAL_COMMUNITY)
Admission: RE | Admit: 2024-02-02 | Discharge: 2024-02-02 | Disposition: A | Discharge status: Home / Self Care | Source: Ambulatory Visit | Attending: Cardiology | Admitting: Cardiology

## 2024-02-02 DIAGNOSIS — I4891 Unspecified atrial fibrillation: Secondary | ICD-10-CM

## 2024-02-02 NOTE — Progress Notes (Signed)
 Pt in for repeat EKG, has been taking Amio 200 mg BID since yesterday.  EKG--> NRS  Pt will continue Amio 200 mg BID for now, is awaiting call to sch appt with EP, is sch with afib clinic on 9/25  Dr Cherrie aware

## 2024-02-04 ENCOUNTER — Ambulatory Visit (HOSPITAL_COMMUNITY): Payer: Self-pay | Admitting: Internal Medicine

## 2024-02-05 ENCOUNTER — Telehealth (HOSPITAL_COMMUNITY): Payer: Self-pay | Admitting: *Deleted

## 2024-02-05 DIAGNOSIS — E876 Hypokalemia: Secondary | ICD-10-CM

## 2024-02-05 DIAGNOSIS — I5022 Chronic systolic (congestive) heart failure: Secondary | ICD-10-CM

## 2024-02-05 MED ORDER — POTASSIUM CHLORIDE CRYS ER 10 MEQ PO TBCR: 40.0000 meq | EXTENDED_RELEASE_TABLET | Freq: Every day | ORAL | 3 refills | Status: DC

## 2024-02-05 NOTE — Telephone Encounter (Signed)
 Called patient per Dr. Cherrie with following lab results and instructions:  K low. Start kcl 40 daily. Have her take 2 tabs on first day only. Repeat BMET 1 week  Pt verbalized understanding of same. She is currently taking three potassium 10 meq tablets daily. She will increase to 4 tabs daily and will take 4 tabs twice daily for one day. She has appointment in A fib clinic 02/15/24 - will add on BMP for that visit.

## 2024-02-15 ENCOUNTER — Ambulatory Visit (HOSPITAL_COMMUNITY)
Admission: RE | Admit: 2024-02-15 | Discharge: 2024-02-15 | Disposition: A | Discharge status: Home / Self Care | Source: Ambulatory Visit | Attending: Physician Assistant | Admitting: Physician Assistant

## 2024-02-15 VITALS — BP 130/96 | HR 45 | Ht 64.0 in | Wt 166.0 lb

## 2024-02-15 DIAGNOSIS — I4891 Unspecified atrial fibrillation: Secondary | ICD-10-CM | POA: Diagnosis not present

## 2024-02-15 DIAGNOSIS — Z79899 Other long term (current) drug therapy: Secondary | ICD-10-CM

## 2024-02-15 DIAGNOSIS — I480 Paroxysmal atrial fibrillation: Secondary | ICD-10-CM | POA: Diagnosis not present

## 2024-02-15 DIAGNOSIS — Z5181 Encounter for therapeutic drug level monitoring: Secondary | ICD-10-CM | POA: Diagnosis not present

## 2024-02-15 DIAGNOSIS — D6869 Other thrombophilia: Secondary | ICD-10-CM

## 2024-02-15 LAB — BASIC METABOLIC PANEL WITH GFR
BUN/Creatinine Ratio: 14 (ref 12–28)
BUN: 16 mg/dL (ref 8–27)
CO2: 26 mmol/L (ref 20–29)
Calcium: 9.6 mg/dL (ref 8.7–10.3)
Chloride: 105 mmol/L (ref 96–106)
Creatinine, Ser: 1.17 mg/dL — ABNORMAL HIGH (ref 0.57–1.00)
Glucose: 95 mg/dL (ref 70–99)
Potassium: 4.6 mmol/L (ref 3.5–5.2)
Sodium: 140 mmol/L (ref 134–144)
eGFR: 51 mL/min/1.73 — ABNORMAL LOW (ref >59)

## 2024-02-15 MED ORDER — AMIODARONE HCL 200 MG PO TABS: 200.0000 mg | ORAL_TABLET | Freq: Every day | ORAL | 3 refills | Status: DC

## 2024-02-15 NOTE — Progress Notes (Signed)
 Primary Care Physician: Cook, Jayce G, DO Primary Cardiologist: Toribio Fuel, MD Electrophysiologist: None  Referring Physician: Harlene Gainer NP   Krystal Silva is a 68 y.o. female with a history of HTN, MS, previous tobacco use, HLD, CHF, atrial fibrillation who presents for follow up in the [facility].  The patient was initially diagnosed with atrial fibrillation 11/13/23 at a visit with her PCP. She was started on Eliquis  for stroke prevention and a two week monitor was placed to evaluate burden. The monitor showed 9% afib burden with an average HR of 140 bpm. Seen in [facility] 02/01/24 in rapid afib and was started on amiodarone . Repeat ECG the following day showed she had converted to SR.   Patient returns for follow up for atrial fibrillation and amiodarone  monitoring. She remains in SR today. She has not noticed any heart racing since starting amiodarone . However, she has noticed slow heart rates and fatigue. No bleeding issues.   Today, she  denies symptoms of palpitations, chest pain, shortness of breath, orthopnea, PND, lower extremity edema, dizziness, presyncope, syncope, snoring, daytime somnolence, bleeding, or neurologic sequela. The patient is tolerating medications without difficulties and is otherwise without complaint today.    Atrial Fibrillation Risk Factors:  she does not have symptoms or diagnosis of sleep apnea. she does not have a history of rheumatic fever. she does not have a history of alcohol use. The patient does not have a history of early familial atrial fibrillation or other arrhythmias.  Atrial Fibrillation Management history:  Previous antiarrhythmic drugs: amiodarone   Previous cardioversions: none Previous ablations: none Anticoagulation history: Eliquis   ROS- All systems are reviewed and negative except as per the HPI above.  Past Medical History:  Diagnosis Date   Allergy    Basal cell carcinoma 09/04/2019    right parotid area - Refer to [facility]   Diverticulitis    GERD (gastroesophageal reflux disease)    Hypercholesteremia    Hypertension    Insomnia    MS (multiple sclerosis)    Vitamin D  deficiency     Current Outpatient Medications  Medication Sig Dispense Refill   apixaban  (ELIQUIS ) 5 MG TABS tablet Take 1 tablet (5 mg total) by mouth 2 (two) times daily. 60 tablet 5   baclofen  (LIORESAL ) 10 MG tablet TAKE 1 TABLET (10 MG TOTAL) BY MOUTH AT BEDTIME. MAKE TAKE AN ADDITIONAL DOSE IF NEEDED 180 tablet 5   cetirizine (ZYRTEC) 10 MG tablet Take 10 mg by mouth daily as needed for allergies.     Cholecalciferol (VITAMIN D -3) 125 MCG (5000 UT) TABS Take 1 tablet by mouth daily. 90 tablet 1   diclofenac  Sodium (VOLTAREN ) 1 % GEL Apply 2 g topically 4 (four) times daily as needed (pain.).     empagliflozin  (JARDIANCE ) 10 MG TABS tablet Take 1 tablet (10 mg total) by mouth daily before breakfast. 30 tablet 5   furosemide  (LASIX ) 20 MG tablet Take 1 tablet (20 mg total) by mouth daily as needed for edema or fluid. (Patient taking differently: Take 20 mg by mouth as needed for edema or fluid.) 30 tablet 5   imipramine  (TOFRANIL ) 25 MG tablet TAKE 1 TO 2 TABLETS (25 TO 50 MG TOTAL) BY MOUTH AT BEDTIME 180 tablet 0   Lidocaine  (BLUE-EMU PAIN RELIEF DRY EX) Apply 1 application  topically 2 (two) times daily as needed (pain.).     metoprolol  succinate (TOPROL  XL) 50 MG 24 hr tablet Take 1 tablet (50 mg total)  by mouth daily. 90 tablet 3   pantoprazole  (PROTONIX ) 40 MG tablet Take 1 tablet (40 mg total) by mouth 2 (two) times daily before a meal. 180 tablet 2   potassium chloride  (KLOR-CON  M10) 10 MEQ tablet Take 4 tablets (40 mEq total) by mouth daily. 360 tablet 3   Probiotic Product (PROBIOTIC DAILY PO) Take 2-3 capsules by mouth daily.     sacubitril-valsartan (ENTRESTO ) 24-26 MG Take 1 tablet by mouth 2 (two) times daily. 90 tablet 2   sertraline  (ZOLOFT ) 100 MG tablet TAKE 1 TABLET BY MOUTH EVERY DAY 90  tablet 2   simvastatin  (ZOCOR ) 40 MG tablet TAKE 1 TABLET BY MOUTH EVERYDAY AT BEDTIME 90 tablet 1   spironolactone  (ALDACTONE ) 25 MG tablet Take 0.5 tablets (12.5 mg total) by mouth daily. 90 tablet 3   amiodarone  (PACERONE ) 200 MG tablet Take 1 tablet (200 mg total) by mouth daily. 30 tablet 3   No current facility-administered medications for this encounter.    Physical Exam: BP (!) 130/96   Pulse (!) 45   Ht 5' 4 (1.626 m)   Wt 75.3 kg   BMI 28.49 kg/m   GEN: Well nourished, well developed in no acute distress CARDIAC: Regular rate and rhythm, no murmurs, rubs, gallops RESPIRATORY:  Clear to auscultation without rales, wheezing or rhonchi  ABDOMEN: Soft, non-tender, non-distended EXTREMITIES:  No edema; No deformity    Wt Readings from Last 3 Encounters:  02/15/24 75.3 kg  02/01/24 74.8 kg  01/05/24 75.6 kg     EKG today demonstrates  SB Vent. rate 45 BPM PR interval 164 ms QRS duration 84 ms QT/QTcB 494/427 ms   Echo 02/01/24 demonstrated   1. Left ventricular ejection fraction, by estimation, is 25 to 30%. The  left ventricle has severely decreased function. The left ventricle  demonstrates global hypokinesis. Left ventricular diastolic function could  not be evaluated.   2. Right ventricular systolic function is mildly reduced. The right  ventricular size is normal.   3. Left atrial size was mildly dilated.   4. Right atrial size was mildly dilated.   5. The mitral valve is grossly normal. Trivial mitral valve  regurgitation.   6. The aortic valve is tricuspid. Aortic valve regurgitation is not  visualized.   7. Rhythm strip during this exam demonstrates atrial fibrillation and  with RVR.   Comparison(s): Changes from prior study are noted. 10/03/2023: LVEF 20-25%.    CHA2DS2-VASc Score = 4  The patient's score is based upon: CHF History: 1 HTN History: 1 Diabetes History: 0 Stroke History: 0 Vascular Disease History: 0 Age Score: 1 Gender Score:  1       ASSESSMENT AND PLAN: Paroxysmal Atrial Fibrillation (ICD10:  I48.0) The patient's CHA2DS2-VASc score is 4, indicating a 4.8% annual risk of stroke.   2 week monitor showed 9% afib burden with rapid rates. AAD options are limited with low EF and interactions with imipramine . Started on amiodarone  but would prefer for this to be a short term medication to avoid off target effects. She has a visit with Dr Nancey to discuss afib ablation. Decrease amiodarone  to 200 mg daily Continue Toprol  50 mg daily Continue Eliquis  5 mg BID  Secondary Hypercoagulable State (ICD10:  D68.69) The patient is at significant risk for stroke/thromboembolism based upon her CHA2DS2-VASc Score of 4.  Continue Apixaban  (Eliquis ). No bleeding issues.   Chronic HFrEF EF 25-30% GDMT per [facility] team Fluid status appears stable today  HTN Stable on current  regimen   Follow up with Dr Mealor as scheduled.    [facility] [address] [place], [place] [address] [phone]

## 2024-02-15 NOTE — Addendum Note (Signed)
 Encounter addended by: Ezzard Alan FALCON, CMA on: 02/15/2024 1:16 PM  Actions taken: Order list changed, Diagnosis association updated

## 2024-02-16 ENCOUNTER — Ambulatory Visit (HOSPITAL_COMMUNITY): Payer: Self-pay | Admitting: Physician Assistant

## 2024-02-16 LAB — BASIC METABOLIC PANEL WITH GFR
BUN/Creatinine Ratio: 14 (ref 12–28)
BUN: 25 mg/dL (ref 8–27)
CO2: 22 mmol/L (ref 20–29)
Calcium: 9.2 mg/dL (ref 8.7–10.3)
Chloride: 96 mmol/L (ref 96–106)
Creatinine, Ser: 1.73 mg/dL — ABNORMAL HIGH (ref 0.57–1.00)
Glucose: 161 mg/dL — ABNORMAL HIGH (ref 70–99)
Potassium: 4.3 mmol/L (ref 3.5–5.2)
Sodium: 141 mmol/L (ref 134–144)
eGFR: 32 mL/min/1.73 — ABNORMAL LOW (ref >59)

## 2024-02-18 ENCOUNTER — Ambulatory Visit: Payer: Self-pay | Admitting: Family Medicine

## 2024-02-18 DIAGNOSIS — R7989 Other specified abnormal findings of blood chemistry: Secondary | ICD-10-CM

## 2024-02-18 NOTE — Progress Notes (Unsigned)
 Electrophysiology Office Note:    Date:  02/19/2024   ID:  Krystal Silva, DOB May 16, 1956, MRN [id number]  PCP:  Bluford Jacqulyn MATSU, DO   [facility] Providers Cardiologist:  Toribio Fuel, MD     Referring MD: Nellene Quita SAUNDERS, [place]   History of Present Illness:    Krystal Silva is a 68 y.o. female with a medical history significant for paroxysmal atrial fibrillation, mitral stenosis, CHFrEF, hypertension referred for management of atrial fibrillation.      History of Present Illness    She was initially diagnosed with atrial fibrillation in June 2025 during a visit with her PCP.  Eliquis  for stroke prevention.  2-week monitor showed a burden of 9% with an average heart rate in atrial fibrillation of 140 bpm.  When in rapid atrial fibrillation and follow-up with heart failure clinic on February 01, 2024, amiodarone  was started.  She reports that she is not always aware of her atrial fibrillation.  She does occasionally have chest symptoms.      Today, she reports that she feels well and has no complaints.  EKGs/Labs/Other Studies Reviewed Today:     Echocardiogram:  TTE February 01, 2024 LVEF 25 to 30%.  Global hypokinesis.  Left atrium and right atrium are mildly dilated.  Trivial MR.   Monitors:  14 day monitor September 2025-- my interpretation In sinus rhythm, heart rate 41 to 108 bpm, average 59 bpm 9% burden of atrial fibrillation, heart rate 72 to 240 bpm, average heart rate 140 bpm.  Longest duration 31 hours.  Otherwise, rare ectopy.  There was a 13 beat of wide-complex tachycardia during rapid A-fib, possible aberrancy.  Stress testing:  CT coronary reviewed  Advanced imaging:  Cardiac MRI January 24, 2024 LVEF 44% with mild, diffuse hypokinesis.  RVEF 45%.  No LGE.  Normal exercise at a volume percentage.  Cardiac catherization  Right/left heart cath May 2025 Normal coronaries.  EKG:   EKG Interpretation Date/Time:  Monday February 19 2024 10:03:00 EDT Ventricular Rate:  44 PR Interval:  176 QRS Duration:  94 QT Interval:  498 QTC Calculation: 425 R Axis:   -10  Text Interpretation: Marked sinus bradycardia Minimal voltage criteria for LVH, may be normal variant ( R in aVL ) When compared with ECG of 15-Feb-2024 11:06, No significant change was found Confirmed by Nancey Scotts [phone]) on 02/19/2024 10:08:31 AM     Physical Exam:    VS:  BP 98/66   Pulse (!) 44   Ht 5' 4 (1.626 m)   Wt 164 lb 9.6 oz (74.7 kg)   SpO2 98%   BMI 28.25 kg/m     Wt Readings from Last 3 Encounters:  02/19/24 164 lb 9.6 oz (74.7 kg)  02/15/24 166 lb (75.3 kg)  02/01/24 165 lb (74.8 kg)     GEN: Well nourished, well developed in no acute distress CARDIAC: RRR, no murmurs, rubs, gallops RESPIRATORY:  Normal work of breathing MUSCULOSKELETAL: no edema    ASSESSMENT & PLAN:     Paroxysmal atrial fibrillation Symptomatic with fatigue Rates extremely rapid at times Resulting in CHF exacerbation, cardiomyopathy Currently on amiodarone  We discussed management options.  Given her rapid rates, heart failure with decreased ejection fraction in the setting of atrial fibrillation, I recommended ablation.  I explained the risks including bleeding, hematoma, heart or blood vessel perforation, and need for surgery, stroke heart attack and even potentially life-threatening complications.  The risk of complications with serious morbidity  or mortality is less than oh percent.  In patients of her population, there is evidence of mortality benefit with ablation.  She would like to think about it and decide whether or not to schedule the procedure.  Chronic systolic heart failure Nonischemic cardiomyopathy EF 44% on MRI, reduced to 25% in the setting of atrial fibrillation with RVR Continue empagliflozin  10 mg, metoprolol  XL 50 mg, Entresto  24-26 mg, spironolactone  25 mg  Secondary hypercoagulable state Continue apixaban  5 mg p.o. twice  daily    Signed, Eulas FORBES Furbish, MD  02/19/2024 10:21 AM    [facility]

## 2024-02-19 ENCOUNTER — Ambulatory Visit: Attending: Cardiovascular Disease | Admitting: Cardiovascular Disease

## 2024-02-19 ENCOUNTER — Encounter: Payer: Self-pay | Admitting: Cardiovascular Disease

## 2024-02-19 VITALS — BP 98/66 | HR 44 | Ht 64.0 in | Wt 164.6 lb

## 2024-02-19 DIAGNOSIS — I4891 Unspecified atrial fibrillation: Secondary | ICD-10-CM

## 2024-02-19 NOTE — Patient Instructions (Signed)
 Medication Instructions:  Your physician recommends that you continue on your current medications as directed. Please refer to the Current Medication list given to you today.  *If you need a refill on your cardiac medications before your next appointment, please call your pharmacy*  Lab Work: None ordered.  If you have labs (blood work) drawn today and your tests are completely normal, you will receive your results only by: MyChart Message (if you have MyChart) OR A paper copy in the mail If you have any lab test that is abnormal or we need to change your treatment, we will call you to review the results.  Testing/Procedures: None ordered.   Follow-Up: At [facility], you and your health needs are our priority.  As part of our continuing mission to provide you with exceptional heart care, our providers are all part of one team.  This team includes your primary Cardiologist (physician) and Advanced Practice Providers or APPs (Physician Assistants and Nurse Practitioners) who all work together to provide you with the care you need, when you need it.  Your next appointment:   3 months with Dr Mealor

## 2024-02-20 ENCOUNTER — Ambulatory Visit: Payer: Commercial Managed Care - PPO | Admitting: Dermatology

## 2024-03-05 ENCOUNTER — Institutional Professional Consult (permissible substitution) (INDEPENDENT_AMBULATORY_CARE_PROVIDER_SITE_OTHER): Admitting: Otolaryngology

## 2024-03-05 LAB — BMP8+EGFR
BUN/Creatinine Ratio: 16 (ref 12–28)
BUN: 17 mg/dL (ref 8–27)
CO2: 24 mmol/L (ref 20–29)
Calcium: 9.2 mg/dL (ref 8.7–10.3)
Chloride: 103 mmol/L (ref 96–106)
Creatinine, Ser: 1.07 mg/dL — ABNORMAL HIGH (ref 0.57–1.00)
Glucose: 78 mg/dL (ref 70–99)
Potassium: 4.5 mmol/L (ref 3.5–5.2)
Sodium: 142 mmol/L (ref 134–144)
eGFR: 57 mL/min/1.73 — ABNORMAL LOW (ref >59)

## 2024-03-11 ENCOUNTER — Encounter: Attending: Gastroenterology | Admitting: Internal Medicine

## 2024-03-11 VITALS — BP 125/71 | HR 50 | Temp 97.6°F | Resp 16

## 2024-03-11 DIAGNOSIS — D509 Iron deficiency anemia, unspecified: Secondary | ICD-10-CM | POA: Diagnosis not present

## 2024-03-11 DIAGNOSIS — E611 Iron deficiency: Secondary | ICD-10-CM

## 2024-03-11 MED ORDER — IRON SUCROSE 300 MG IVPB - SIMPLE MED
300.0000 mg | Status: DC
  Administered 2024-03-11: 300 mg via INTRAVENOUS
  Filled 2024-03-11: qty 200

## 2024-03-11 MED ORDER — DIPHENHYDRAMINE HCL 25 MG PO CAPS
25.0000 mg | ORAL_CAPSULE | Freq: Once | ORAL | Status: AC
  Administered 2024-03-11: 25 mg via ORAL

## 2024-03-11 MED ORDER — ACETAMINOPHEN 325 MG PO TABS
650.0000 mg | ORAL_TABLET | Freq: Once | ORAL | Status: AC
  Administered 2024-03-11: 650 mg via ORAL

## 2024-03-11 NOTE — Progress Notes (Addendum)
 Diagnosis: Iron Deficiency Anemia  Provider:  Bensimhon, Daniel R, MD   Procedure: IV Infusion  IV Type: Peripheral, IV Location: R Antecubital  Venofer (Iron Sucrose), Dose: 300 mg  Infusion Start Time: 1417  Infusion Stop Time: 1614  Post Infusion IV Care: Observation period completed  Discharge: Condition: Good, Destination: Home . AVS Provided  Performed by:  Blanca Selinda SAUNDERS, LPN

## 2024-03-13 ENCOUNTER — Other Ambulatory Visit (HOSPITAL_COMMUNITY): Payer: Self-pay | Admitting: Family Medicine

## 2024-03-20 ENCOUNTER — Encounter (INDEPENDENT_AMBULATORY_CARE_PROVIDER_SITE_OTHER): Admitting: Emergency Medicine

## 2024-03-20 VITALS — BP 129/75 | HR 46 | Temp 97.5°F | Resp 15

## 2024-03-20 DIAGNOSIS — D509 Iron deficiency anemia, unspecified: Secondary | ICD-10-CM

## 2024-03-20 DIAGNOSIS — E611 Iron deficiency: Secondary | ICD-10-CM

## 2024-03-20 MED ORDER — ACETAMINOPHEN 325 MG PO TABS
650.0000 mg | ORAL_TABLET | Freq: Once | ORAL | Status: AC
  Administered 2024-03-20: 650 mg via ORAL

## 2024-03-20 MED ORDER — DIPHENHYDRAMINE HCL 25 MG PO CAPS
25.0000 mg | ORAL_CAPSULE | Freq: Once | ORAL | Status: AC
  Administered 2024-03-20: 25 mg via ORAL

## 2024-03-20 MED ORDER — SODIUM CHLORIDE 0.9 % IV SOLN
300.0000 mg | INTRAVENOUS | Status: DC
  Administered 2024-03-20: 300 mg via INTRAVENOUS
  Filled 2024-03-20: qty 15

## 2024-03-20 NOTE — Progress Notes (Signed)
 Diagnosis: Iron Deficiency Anemia  Provider:  Bensimhon, Daniel R, MD   Procedure: IV Infusion  IV Type: Peripheral, IV Location: L Antecubital  Venofer (Iron Sucrose), Dose: 300 mg  Infusion Start Time: 0949  Infusion Stop Time: 1122  Post Infusion IV Care: Observation period completed and Peripheral IV Discontinued  Discharge: Condition: Good, Destination: Home . AVS Provided  Performed by:  Delon ONEIDA Officer, RN

## 2024-03-27 ENCOUNTER — Encounter: Attending: Gastroenterology | Admitting: Internal Medicine

## 2024-03-27 VITALS — BP 116/65 | HR 47 | Temp 97.8°F | Resp 16

## 2024-03-27 DIAGNOSIS — E611 Iron deficiency: Secondary | ICD-10-CM | POA: Insufficient documentation

## 2024-03-27 DIAGNOSIS — D509 Iron deficiency anemia, unspecified: Secondary | ICD-10-CM | POA: Insufficient documentation

## 2024-03-27 MED ORDER — ACETAMINOPHEN 325 MG PO TABS: 650.0000 mg | ORAL_TABLET | Freq: Once | ORAL | Status: AC

## 2024-03-27 MED ORDER — SODIUM CHLORIDE 0.9 % IV SOLN
300.0000 mg | INTRAVENOUS | Status: DC
  Administered 2024-03-27: 300 mg via INTRAVENOUS
  Filled 2024-03-27: qty 5

## 2024-03-27 MED ORDER — DIPHENHYDRAMINE HCL 25 MG PO CAPS: 25.0000 mg | ORAL_CAPSULE | Freq: Once | ORAL | Status: AC

## 2024-03-27 NOTE — Progress Notes (Addendum)
 Diagnosis: Iron Deficiency Anemia  Provider:  Bensimhon, Daniel R, MD   Procedure: IV Infusion  IV Type: Peripheral, IV Location: L Antecubital  Venofer (Iron Sucrose), Dose: 300 mg  Infusion Start Time: 0934  Infusion Stop Time: 1113  Post Infusion IV Care: Observation period completed  Discharge: Condition: Good, Destination: Home . AVS Provided  Performed by:  Blanca Selinda SAUNDERS, LPN

## 2024-04-01 ENCOUNTER — Other Ambulatory Visit: Payer: Self-pay | Admitting: Family Medicine

## 2024-04-01 ENCOUNTER — Other Ambulatory Visit: Payer: Self-pay | Admitting: Neurology

## 2024-04-01 ENCOUNTER — Other Ambulatory Visit: Payer: Self-pay | Admitting: Gastroenterology

## 2024-04-01 DIAGNOSIS — E781 Pure hyperglyceridemia: Secondary | ICD-10-CM

## 2024-04-01 DIAGNOSIS — K219 Gastro-esophageal reflux disease without esophagitis: Secondary | ICD-10-CM

## 2024-04-01 DIAGNOSIS — F418 Other specified anxiety disorders: Secondary | ICD-10-CM

## 2024-04-02 ENCOUNTER — Institutional Professional Consult (permissible substitution) (INDEPENDENT_AMBULATORY_CARE_PROVIDER_SITE_OTHER): Admitting: Otolaryngology

## 2024-04-03 NOTE — Telephone Encounter (Signed)
 Last seen on 08/24/23 Follow up scheduled 04/04/24

## 2024-04-03 NOTE — Telephone Encounter (Signed)
 Noted

## 2024-04-04 ENCOUNTER — Encounter (HOSPITAL_COMMUNITY): Admitting: Internal Medicine

## 2024-04-04 ENCOUNTER — Ambulatory Visit: Payer: Commercial Managed Care - PPO | Admitting: Neurology

## 2024-04-04 ENCOUNTER — Encounter: Payer: Self-pay | Admitting: Neurology

## 2024-04-04 VITALS — BP 130/76 | HR 54 | Ht 64.0 in | Wt 168.6 lb

## 2024-04-04 DIAGNOSIS — G35A Relapsing-remitting multiple sclerosis: Secondary | ICD-10-CM | POA: Diagnosis not present

## 2024-04-04 DIAGNOSIS — F418 Other specified anxiety disorders: Secondary | ICD-10-CM

## 2024-04-04 DIAGNOSIS — R29818 Other symptoms and signs involving the nervous system: Secondary | ICD-10-CM

## 2024-04-04 DIAGNOSIS — R208 Other disturbances of skin sensation: Secondary | ICD-10-CM

## 2024-04-04 NOTE — Progress Notes (Signed)
 [facility]  PATIENT: Krystal Silva DOB: June 25, 1955  REFERRING DOCTOR OR PCP:   Elsie GORMAN Alphonsa Tinnie SOURCE: patient, records from initial Neurology group and MRI images on PACS/CD  _________________________________     HISTORICAL  CHIEF COMPLAINT:  Chief Complaint  Patient presents with   Follow-up    Pt in room 11.alone. Here for MS follow up.    HISTORY OF PRESENT ILLNESS:  Krystal Silva is a 68 y.o.  woman with relapsing remitting MS diagnosed in 2006.  Update 04/05/2023: MS is stable.   She is not on a DMT.  04/2023 MRI showed no new lesion.   She stopped Avonex  around March 2023 -- she had had no exacerbation or MRI change > 10 years while on Avonex .  Only 2 exacerbations were 2003 and 2006.   She has no definite new MS symptoms x years.          Her gait is stable and bothered more by her knee pain than MS related balance issues.   Her balance is slightly off but no falls   She can go over a mile if knee pain is mild.  Legs are strong. She uses the bannister on stairs for safety.    She has mild spasticity, helped by baclofen .  No new sensory changes.  She has mild dysesthesias in the limbs, oftne noted while sitting, helped by imipramine  but she was able to cut back as symptoms are milder.     Bladder function is stable with mild frequency.   She has 1-2 times nocturia     Vision is mostly ok but cloudy if tired and longer time looking at monitor.     Fatigue is mild most days. She is generally sleeping well.    Imipramine  may help.    Occasional   dozing watching TV at night.   She has some RLS at night, may awaken her in middle of night .  She never took gabapentin.     Mood is doing well.   She notes stress at work. .  She is on sertraline .   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    TIA?:   In late March 2024, while shopping  the left arm felt like it was not there. There was no tingling in  the arm.  Her left face felt swollen and she ha difficult getting 'good bye' out at the store.  .  This lasted 5-10 minutes .  She then felt baseline and drove home without issues. She has never had a TIA or CVA in the past.    She felt a little stressed after she worked 6 days that week.   The only other transient neuro symptoms was some flickering lights in vision for 5-10 minutes  6-7 years ago when BP meds were changed.  She started the bASA 81 mg.    No evidence of CVA on last MRi  Vascular risks:  HTN.  Former tobacco (< 1ppd x 20 yr).  HLD (on Zocor ).  No DM, cardiac issues.    She has knee pain, worse in cold weather.   She had a basal cell carcinoma removed.   She was seeing dermatology regularly but not the last 2 years as her doctor   Vit D was low and she takes supplements       MS History:   In 2003, she had an episode of diplopia. An MRI was performed but  she was not diagnosed with MS at that time. In 2006, in [place],  she presented with dysesthetic sensations in the legs, mostly around the shin. She had MRI's performed that were more consistent with MS. She also had a lumbar puncture and the CSF was consistent with MS. The CSF showed oligoclonal bands and IgG index of 1.37 (elevated). She started to see Dr. Jeris and then Dr. Clarice in 2010/2011 after moving to the area.    An MRI performed in 2011 was reportedly very similar to the one from 2006. She has been on Avonex  therapy since diagnosis..  IMAGING: MRI Brain 04/23/2015 showed  T2/FLAIR hyperintense foci in the periventricular, deep and juxtacortical white matter. The pattern is consistent with multiple sclerosis. There are no enhancing lesions. When compared to the MRI dated 08/12/2013, there is no definite change.  MRI brain 04/20/2022 showed Multiple T2/FLAIR hyperintense foci in the cerebral hemispheres in a pattern consistent with chronic demyelinating plaque associated with multiple sclerosis. None of the foci appear to  be acute. They do not enhance. Compared to the MRI from 03/24/2021, there were no new lesions.   MRI brain 04/26/2023 showed Scattered T2/FLAIR hyperintense foci in the cerebral hemispheres, predominantly in the periventricular white matter, in a pattern consistent with chronic demyelinating plaque associated with multiple sclerosis. None of the foci appear to be acute. They do not enhance. Compared to the MRI from 04/20/2022, there were no new lesions.   REVIEW OF SYSTEMS: Constitutional: No fevers, chills, sweats, or change in appetite Eyes: No visual changes, double vision, eye pain Ear, nose and throat: No hearing loss, ear pain, nasal congestion, sore throat Cardiovascular: No chest pain, palpitations Respiratory:  No shortness of breath at rest or with exertion.   No wheezes GastrointestinaI: No nausea, vomiting, diarrhea, abdominal pain, fecal incontinence Genitourinary:  No dysuria, urinary retention or frequency.  No nocturia. Musculoskeletal:  No neck pain, back pain Integumentary: No rash, pruritus, skin lesions Neurological: as above Psychiatric: No depression at this time.  No anxiety Endocrine: No palpitations, diaphoresis, change in appetite, change in weigh or increased thirst Hematologic/Lymphatic:  No anemia, purpura, petechiae. Allergic/Immunologic: No itchy/runny eyes, nasal congestion, recent allergic reactions, rashes  ALLERGIES: Allergies  Allergen Reactions   Cefzil  [Cefprozil ] Diarrhea   Codeine Nausea And Vomiting   Penicillins Rash   Sulfonamide Derivatives Rash    HOME MEDICATIONS:  Current Outpatient Medications:    amiodarone  (PACERONE ) 200 MG tablet, Take 1 tablet (200 mg total) by mouth daily., Disp: 30 tablet, Rfl: 3   apixaban  (ELIQUIS ) 5 MG TABS tablet, Take 1 tablet (5 mg total) by mouth 2 (two) times daily., Disp: 60 tablet, Rfl: 5   baclofen  (LIORESAL ) 10 MG tablet, TAKE 1 TABLET (10 MG TOTAL) BY MOUTH AT BEDTIME. MAKE TAKE AN ADDITIONAL DOSE IF  NEEDED, Disp: 180 tablet, Rfl: 5   cetirizine (ZYRTEC) 10 MG tablet, Take 10 mg by mouth daily as needed for allergies., Disp: , Rfl:    Cholecalciferol (VITAMIN D -3) 125 MCG (5000 UT) TABS, Take 1 tablet by mouth daily., Disp: 90 tablet, Rfl: 1   diclofenac  Sodium (VOLTAREN ) 1 % GEL, Apply 2 g topically 4 (four) times daily as needed (pain.)., Disp: , Rfl:    empagliflozin  (JARDIANCE ) 10 MG TABS tablet, Take 1 tablet (10 mg total) by mouth daily before breakfast., Disp: 30 tablet, Rfl: 5   furosemide  (LASIX ) 20 MG tablet, Take 1 tablet (20 mg total) by mouth daily as needed for edema or fluid. (  Patient taking differently: Take 20 mg by mouth as needed for edema or fluid.), Disp: 30 tablet, Rfl: 5   imipramine  (TOFRANIL ) 25 MG tablet, TAKE 1 TO 2 TABLETS (25 TO 50 MG TOTAL) BY MOUTH AT BEDTIME, Disp: 180 tablet, Rfl: 0   Lidocaine  (BLUE-EMU PAIN RELIEF DRY EX), Apply 1 application  topically 2 (two) times daily as needed (pain.)., Disp: , Rfl:    metoprolol  succinate (TOPROL  XL) 50 MG 24 hr tablet, Take 1 tablet (50 mg total) by mouth daily., Disp: 90 tablet, Rfl: 3   pantoprazole  (PROTONIX ) 40 MG tablet, TAKE 1 TABLET (40 MG TOTAL) BY MOUTH TWICE A DAY BEFORE MEALS, Disp: 180 tablet, Rfl: 2   potassium chloride  (KLOR-CON  M10) 10 MEQ tablet, Take 4 tablets (40 mEq total) by mouth daily., Disp: 360 tablet, Rfl: 3   Probiotic Product (PROBIOTIC DAILY PO), Take 2-3 capsules by mouth daily., Disp: , Rfl:    sacubitril-valsartan (ENTRESTO ) 24-26 MG, TAKE 1 TABLET BY MOUTH TWICE A DAY, Disp: 90 tablet, Rfl: 2   sertraline  (ZOLOFT ) 100 MG tablet, TAKE 1 TABLET BY MOUTH EVERY DAY, Disp: 90 tablet, Rfl: 2   simvastatin  (ZOCOR ) 40 MG tablet, TAKE 1 TABLET BY MOUTH EVERYDAY AT BEDTIME, Disp: 90 tablet, Rfl: 1   spironolactone  (ALDACTONE ) 25 MG tablet, Take 0.5 tablets (12.5 mg total) by mouth daily., Disp: 90 tablet, Rfl: 3  Current Facility-Administered Medications:    acetaminophen  (TYLENOL ) tablet 650 mg,  650 mg, Oral, Once, Eartha Flavors, Daniel, MD   diphenhydrAMINE (BENADRYL) capsule 25 mg, 25 mg, Oral, Once, Eartha Flavors Sieving, MD  PAST MEDICAL HISTORY: Past Medical History:  Diagnosis Date   A-fib [facility])    Allergy    Basal cell carcinoma 09/04/2019   right parotid area - Refer to [facility]   CHF (congestive heart failure) (HCC)    Diverticulitis    GERD (gastroesophageal reflux disease)    Hypercholesteremia    Hypertension    Insomnia    MS (multiple sclerosis)    Vitamin D  deficiency     PAST SURGICAL HISTORY: Past Surgical History:  Procedure Laterality Date   BIOPSY  08/03/2020   Procedure: BIOPSY;  Surgeon: Cindie Carlin POUR, DO;  Location: [facility];  Service: Endoscopy;;   BIOPSY  10/26/2020   Procedure: BIOPSY;  Surgeon: Cindie Carlin POUR, DO;  Location: [facility];  Service: Endoscopy;;   COLONOSCOPY  08/21/2009   COLONOSCOPY WITH PROPOFOL  N/A 08/03/2020    Surgeon: Cindie Carlin POUR, DO;  internal hemorrhoids, diverticulosis in the sigmoid, descending, and transverse colon, otherwise normal exam.  Biopsies were taken to evaluate for microscopic colitis.  Recommended repeat colonoscopy in 10 years.    ESOPHAGOGASTRODUODENOSCOPY (EGD) WITH PROPOFOL  N/A 10/26/2020   Surgeon: Cindie Carlin POUR, DO; Gastritis s/p biopsy (negative for H. pylori), normal examined duodenum biopsied (benign), nonobstructing Schatzki's ring, not manipulated.   KNEE ARTHROSCOPY WITH MEDIAL MENISECTOMY Right 03/27/2015   Procedure: KNEE ARTHROSCOPY WITH PARTIAL MEDIAL MENISECTOMY AND MICROFRACTURE;  Surgeon: Taft FORBES Minerva, MD;  Location: [facility];  Service: Orthopedics;  Laterality: Right;   RIGHT/LEFT HEART CATH AND CORONARY ANGIOGRAPHY N/A 10/09/2023   Procedure: RIGHT/LEFT HEART CATH AND CORONARY ANGIOGRAPHY;  Surgeon: Cherrie Sieving SAUNDERS, MD;  Location: [facility];  Service: Cardiovascular;  Laterality: N/A;   TONSILLECTOMY     WISDOM TOOTH EXTRACTION  1980's     FAMILY HISTORY: Family History  Problem Relation Age of Onset   Hypertension Mother    Hyperlipidemia  Father    Hearing loss Father    Hyperlipidemia Brother    Diabetes Brother    Vision loss Maternal Grandmother    Vision loss Paternal Grandmother    Cancer Other        breast   Inflammatory bowel disease Neg Hx    Colon cancer Neg Hx    Ulcerative colitis Neg Hx    Crohn's disease Neg Hx     SOCIAL HISTORY:  Social History   Socioeconomic History   Marital status: Divorced    Spouse name: Not on file   Number of children: Not on file   Years of education: 18   Highest education level: Master's degree (e.g., MA, MS, MEng, MEd, MSW, MBA)  Occupational History   Occupation: work   Tobacco Use   Smoking status: Former    Current packs/day: 0.00    Average packs/day: 1.5 packs/day for 30.0 years (45.0 ttl pk-yrs)    Types: Cigarettes    Start date: 12/26/1974    Quit date: 06/22/2004    Years since quitting: 19.7   Smokeless tobacco: Never  Vaping Use   Vaping status: Never Used  Substance and Sexual Activity   Alcohol use: Yes    Alcohol/week: 1.0 standard drink of alcohol    Types: 1 Cans of beer per week    Comment: rare social   Drug use: No   Sexual activity: Not Currently  Other Topics Concern   Not on file  Social History Narrative   Coffee in the morning couple of cups    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[employer]   Social Drivers of Health   Financial Resource Strain: Low Risk  (11/12/2023)   Overall Financial Resource Strain (CARDIA)    Difficulty of Paying Living Expenses: Not very hard  Food Insecurity: No Food Insecurity (11/12/2023)   Hunger Vital Sign    Worried About Running Out of Food in the Last Year: Never true    Ran Out of Food in the Last Year: Never true  Transportation Needs: No Transportation Needs (11/12/2023)   PRAPARE - Administrator, Civil Service (Medical): No    Lack of  Transportation (Non-Medical): No  Physical Activity: Insufficiently Active (11/12/2023)   Exercise Vital Sign    Days of Exercise per Week: 3 days    Minutes of Exercise per Session: 20 min  Stress: Stress Concern Present (11/12/2023)   [employer] of Occupational Health - Occupational Stress Questionnaire    Feeling of Stress: To some extent  Social Connections: Socially Isolated (11/12/2023)   Social Connection and Isolation Panel    Frequency of Communication with Friends and Family: Once a week    Frequency of Social Gatherings with Friends and Family: Once a week    Attends Religious Services: Never    Database Administrator or Organizations: No    Attends Engineer, Structural: Not on file    Marital Status: Divorced  Intimate Partner Violence: Not on file     PHYSICAL EXAM  Vitals:   04/04/24 1002  BP: 130/76  Pulse: (!) 54  Weight: 168 lb 9.6 oz (76.5 kg)  Height: 5' 4 (1.626 m)    Body mass index is 28.94 kg/m.   General: The patient is well-developed and well-nourished and in no acute distress  Neurologic Exam  Mental status: The patient is alert and oriented x 3 at the time of the  examination. The patient has apparent normal recent and remote memory, with an apparently normal attention span and concentration ability.   Speech is normal.   No aphasia or word finding difficulty  Cranial nerves: Extraocular movements are full.  Facial strength and sensation is normal.  Hearing appears to be normal and symmetric.    Motor:  Muscle bulk is normal.   Tone is mildly increased in legs. Strength is  5 / 5 in all 4 extremities   Sensory: Sensory testing intact to touch, temperature and vibration in the arms and legs.  Coordination: Finger-nose-finger and heel-to-shin is performed well.  Gait and station: Station is normal.   The gait is near normal  Tandem gait is poor.  No Romberg sign  Reflexes: Deep tendon reflexes are symmetric and increased  bilaterally in knees with mild spread and at ankles with  2 beats non-sustained clonus      Multiple sclerosis, relapsing-remitting  Transient neurological symptoms  Depression with anxiety  Dysesthesia   1.  Her MS is stable and she will remain off Avonex .  Last MRi showed no change.   Check MRI of the brain again in 2026 to see if having any subclinical progression.  If present, we would need to consider restarting Avonex  or a different disease modifying therapy.   2.   She may have had a TIA.   She is now on Eliquis  for paroxysmal AFib and CHF 3.   Continue baclofen  for spasticity and sertraline  and imipramine  for mood/sleep/dysesthesia  4   If RLS worsens, consider gabapentin or ropinirole .   return in 9 months or sooner if there are new or worsening neurologic symptoms.  This visit is part of a comprehensive longitudinal care medical relationship regarding the patients primary diagnosis of MS and related concerns.   Merrily Tegeler A. Vear, MD, PhD 04/04/2024, 10:35 AM Certified in Neurology, Clinical Neurophysiology, Sleep Medicine, Pain Medicine and Neuroimaging  [facility] [address], [address] [place], [place] [address] [phone]

## 2024-04-04 NOTE — Addendum Note (Signed)
 Addended by: VEAR CHARLIE LABOR on: 04/04/2024 07:32 PM   Modules accepted: Level of Service

## 2024-04-05 ENCOUNTER — Encounter (HOSPITAL_COMMUNITY): Admitting: Internal Medicine

## 2024-04-23 ENCOUNTER — Institutional Professional Consult (permissible substitution) (INDEPENDENT_AMBULATORY_CARE_PROVIDER_SITE_OTHER): Admitting: Otolaryngology

## 2024-04-25 ENCOUNTER — Other Ambulatory Visit (HOSPITAL_COMMUNITY): Payer: Self-pay | Admitting: Internal Medicine

## 2024-04-29 ENCOUNTER — Ambulatory Visit: Admitting: Cardiovascular Disease

## 2024-05-01 NOTE — Progress Notes (Signed)
 ADVANCED HF CLINIC NOTE  Primary Care: Cook, Jayce G, DO HF Cardiologist: Dr. Cherrie  HPI:  Krystal Silva is a 68 y.o. woman with h/o HTN, Krystal, previous tobacco abuse (quit 2006 after 45 pys) and hyperlipidemia referred by Dr. Bluford for new onset HF.   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 Lasted 1 minute then resolved. Saw Dr. Bluford and was referred back to her Krystal neurologist. Carotid u/s <1-39% bilaterally. Had MRI 12/24 which was c/w her Krystal.   In March 2025 [place] the beach and had episode of chest tightness and tried her brother's inhaler which helped. No CP since that time.  Saw Dr. Bluford on 09/12/23 for one week h/o SOB. BNP 629. Lasix  20mg  daily started and echo ordered.   Echo 10/03/23 EF 20-25% with global HK and moderate RV HK. Moderate MR  [facility] 5/25 showed normal cors (vertically oriented path of prox LAD), well-compensated hemodynamics, EF 30-35%.   Seen by PCP yesterday found to be in new AF with RVR. Started on Eliquis  & Toprol  increased. Referred here.  She is back to work. Still with exertional fatigue and mild DOE. No edema, orthopnea or PND. Did not clearly feel AF. Denies snoring.   Has been follwoed by AF Clinic. Zio in 7/25 with 9% AF burden   cMRI 9/25 EF 44% RV EF 45% No LG  Echo 02/01/24 in setting of AF with RVR EF 25-30% Personally reviewed  Has seen EP (Mealor) in 9/25 who recommended ablation.   Here for f/u. Feels pretty good. Still working at [employer]. Occasional SOB but improves when she takes lasix  (about once every few weeks) - thinks she doesn' take them enough. Denies palpitations. No bleeding with Eliquis .    Family Hx: mother passed of aortic aneurysm, had uncontrolled HTN x years, father had irregular heart rhythm  Cardiac Studies - [facility] 5/25: normal cors, vertically oriented path of proximal LAD; RA 3, PA 28/5 (17), PCWP 5,  CO/CI (Fick) 5.5/3.0, PVR 2.2, PAPi 7.7.  - Echo 5/25: EF 20-25% with global HK and moderate RV HK. Moderate MR  Past Medical History:  Diagnosis Date   A-fib [facility])    Allergy    Basal cell carcinoma 09/04/2019   right parotid area - Refer to [facility]   CHF (congestive heart failure) (HCC)    Diverticulitis    GERD (gastroesophageal reflux disease)    Hypercholesteremia    Hypertension    Insomnia    Krystal (multiple sclerosis)    Vitamin D  deficiency    Current Outpatient Medications  Medication Sig Dispense Refill   amiodarone  (PACERONE ) 200 MG tablet Take 200 mg by mouth daily.     apixaban  (ELIQUIS ) 5 MG TABS tablet Take 1 tablet (5 mg total) by mouth 2 (two) times daily. 60 tablet 5   baclofen  (LIORESAL ) 10 MG tablet TAKE 1 TABLET (10 MG TOTAL) BY MOUTH AT BEDTIME. MAKE TAKE AN ADDITIONAL DOSE IF NEEDED 180 tablet 5   cetirizine (ZYRTEC) 10 MG tablet Take 10 mg by mouth daily as needed for allergies.     Cholecalciferol (VITAMIN D -3) 125 MCG (5000 UT) TABS Take 1 tablet by mouth daily. 90 tablet 1   diclofenac  Sodium (VOLTAREN ) 1 % GEL Apply 2 g topically 4 (four) times daily as needed (pain.).     empagliflozin  (JARDIANCE ) 10 MG TABS tablet Take 1 tablet (10  mg total) by mouth daily before breakfast. 30 tablet 5   furosemide  (LASIX ) 20 MG tablet Take 1 tablet (20 mg total) by mouth daily as needed for edema or fluid. 30 tablet 5   imipramine  (TOFRANIL ) 25 MG tablet TAKE 1 TO 2 TABLETS (25 TO 50 MG TOTAL) BY MOUTH AT BEDTIME 180 tablet 0   Lidocaine  (BLUE-EMU PAIN RELIEF DRY EX) Apply 1 application  topically 2 (two) times daily as needed (pain.).     metoprolol  succinate (TOPROL  XL) 50 MG 24 hr tablet Take 1 tablet (50 mg total) by mouth daily. 90 tablet 3   pantoprazole  (PROTONIX ) 40 MG tablet TAKE 1 TABLET (40 MG TOTAL) BY MOUTH TWICE A DAY BEFORE MEALS 180 tablet 2   potassium chloride  (KLOR-CON  M10) 10 MEQ tablet Take 4 tablets (40 mEq total) by mouth daily. 360 tablet 3    Probiotic Product (PROBIOTIC DAILY PO) Take 2-3 capsules by mouth daily.     sacubitril-valsartan (ENTRESTO ) 24-26 MG TAKE 1 TABLET BY MOUTH TWICE A DAY 90 tablet 2   sertraline  (ZOLOFT ) 100 MG tablet TAKE 1 TABLET BY MOUTH EVERY DAY 90 tablet 2   simvastatin  (ZOCOR ) 40 MG tablet TAKE 1 TABLET BY MOUTH EVERYDAY AT BEDTIME 90 tablet 1   spironolactone  (ALDACTONE ) 25 MG tablet Take 0.5 tablets (12.5 mg total) by mouth daily. 90 tablet 3   Current Facility-Administered Medications  Medication Dose Route Frequency Provider Last Rate Last Admin   acetaminophen  (TYLENOL ) tablet 650 mg  650 mg Oral Once Eartha Flavors, Toribio, MD       diphenhydrAMINE  (BENADRYL ) capsule 25 mg  25 mg Oral Once Castaneda Mayorga, Torrence Branagan, MD       Allergies  Allergen Reactions   Cefzil  [Cefprozil ] Diarrhea   Codeine Nausea And Vomiting   Penicillins Rash   Sulfonamide Derivatives Rash   Social History   Socioeconomic History   Marital status: Divorced    Spouse name: Not on file   Number of children: Not on file   Years of education: 30   Highest education level: Master's degree (e.g., MA, Krystal, MEng, MEd, MSW, MBA)  Occupational History   Occupation: work   Tobacco Use   Smoking status: Former    Current packs/day: 0.00    Average packs/day: 1.5 packs/day for 30.0 years (45.0 ttl pk-yrs)    Types: Cigarettes    Start date: 12/26/1974    Quit date: 06/22/2004    Years since quitting: 19.8   Smokeless tobacco: Never  Vaping Use   Vaping status: Never Used  Substance and Sexual Activity   Alcohol use: Yes    Alcohol/week: 1.0 standard drink of alcohol    Types: 1 Cans of beer per week    Comment: rare social   Drug use: No   Sexual activity: Not Currently  Other Topics Concern   Not on file  Social History Narrative   Coffee in the morning couple of cups    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[employer]   Social Drivers of Health   Tobacco Use: Medium Risk  (04/04/2024)   Patient History    Smoking Tobacco Use: Former    Smokeless Tobacco Use: Never    Passive Exposure: Not on file  Financial Resource Strain: Low Risk (11/12/2023)   Overall Financial Resource Strain (CARDIA)    Difficulty of Paying Living Expenses: Not very hard  Food Insecurity: No Food Insecurity (11/12/2023)   Epic  Worried About Programme Researcher, Broadcasting/film/video in the Last Year: Never true    Ran Out of Food in the Last Year: Never true  Transportation Needs: No Transportation Needs (11/12/2023)   Epic    Lack of Transportation (Medical): No    Lack of Transportation (Non-Medical): No  Physical Activity: Insufficiently Active (11/12/2023)   Exercise Vital Sign    Days of Exercise per Week: 3 days    Minutes of Exercise per Session: 20 min  Stress: Stress Concern Present (11/12/2023)   [employer] of Occupational Health - Occupational Stress Questionnaire    Feeling of Stress: To some extent  Social Connections: Socially Isolated (11/12/2023)   Social Connection and Isolation Panel    Frequency of Communication with Friends and Family: Once a week    Frequency of Social Gatherings with Friends and Family: Once a week    Attends Religious Services: Never    Database Administrator or Organizations: No    Attends Engineer, Structural: Not on file    Marital Status: Divorced  Intimate Partner Violence: Not on file  Depression (PHQ2-9): Low Risk (03/27/2024)   Depression (PHQ2-9)    PHQ-2 Score: 0  Alcohol Screen: Low Risk (11/12/2023)   Alcohol Screen    Last Alcohol Screening Score (AUDIT): 1  Housing: Unknown (11/12/2023)   Epic    Unable to Pay for Housing in the Last Year: No    Number of Times Moved in the Last Year: Not on file    Homeless in the Last Year: No  Utilities: Not on file  Health Literacy: Not on file    Family History  Problem Relation Age of Onset   Hypertension Mother    Hyperlipidemia Father    Hearing loss Father    Hyperlipidemia  Brother    Diabetes Brother    Vision loss Maternal Grandmother    Vision loss Paternal Grandmother    Cancer Other        breast   Inflammatory bowel disease Neg Hx    Colon cancer Neg Hx    Ulcerative colitis Neg Hx    Crohn's disease Neg Hx    Wt Readings from Last 3 Encounters:  05/02/24 77.4 kg (170 lb 9.6 oz)  04/04/24 76.5 kg (168 lb 9.6 oz)  02/19/24 74.7 kg (164 lb 9.6 oz)   BP 124/80   Pulse (!) 56   Wt 77.4 kg (170 lb 9.6 oz)   SpO2 97%   BMI 29.28 kg/m   PHYSICAL EXAM: General:  Sitting up. No resp difficulty HEENT: normal Neck: supple. no JVD.  Cor: Regular rate & rhythm. No rubs, gallops or murmurs. Lungs: clear Abdomen: soft, nontender, nondistended.Good bowel sounds. Extremities: no cyanosis, clubbing, rash, edema Neuro: alert & orientedx3, cranial nerves grossly intact. moves all 4 extremities w/o difficulty. Affect pleasant   ECG (personally reviewed): Sine 52 No ST-T wave abnormalities. Personally reviewed   ASSESSMENT & PLAN:  1. Chronic systolic HF - Echo 10/03/23 EF 79-74% with global HK and moderate RV HK. Moderate MR - R/[facility]HC 5/25: NICM, normal cors with vertically oriented path of prox LAD, well compensated hemodynamics - cMRI 9/25 EF 44% RV EF 45% No LGE - Echo 02/01/24 EF 25-30% in setting of AF with RVR - EF improving with maintenace of SR but back down again in 9/25 in setting of AF  - NYHA II - Continue Entresto  24/26 mg bid - Continue Lasix  20 mg PRN - Increase spiro to 25  daily and decrease KCl to 30 daily. BMET 1 week - HR low. Decrease Toprol  XL 50 mg daily -> 25 daily. - Continue Jardaince 10 - Due for repeat echo and labs  2. PAF - followed by AF clinic - Zio 7/25 AF burden 9%  - Remains on amio 200 daily - Has seen EP (Mealor) in 9/25 who recommended ablation. I encouraged her to f/u so we can avoid long-term side effects of amio  - Continue Eliquis .   3. HTN  - BP runs low typically but ok today   4. Multiple  sclerosis - per Neurology.  Toribio Fuel, MD  9:34 AM

## 2024-05-02 ENCOUNTER — Ambulatory Visit (HOSPITAL_COMMUNITY)
Admission: RE | Admit: 2024-05-02 | Discharge: 2024-05-02 | Disposition: A | Discharge status: Home / Self Care | Source: Ambulatory Visit | Attending: Internal Medicine | Admitting: Internal Medicine

## 2024-05-02 VITALS — BP 124/80 | HR 56 | Wt 170.6 lb

## 2024-05-02 DIAGNOSIS — I4891 Unspecified atrial fibrillation: Secondary | ICD-10-CM

## 2024-05-02 DIAGNOSIS — E876 Hypokalemia: Secondary | ICD-10-CM

## 2024-05-02 DIAGNOSIS — I5022 Chronic systolic (congestive) heart failure: Secondary | ICD-10-CM

## 2024-05-02 MED ORDER — SPIRONOLACTONE 25 MG PO TABS: 25.0000 mg | ORAL_TABLET | Freq: Every day | ORAL | 3 refills | Status: AC

## 2024-05-02 MED ORDER — METOPROLOL SUCCINATE ER 25 MG PO TB24: 25.0000 mg | ORAL_TABLET | Freq: Every day | ORAL | 5 refills | Status: AC

## 2024-05-02 NOTE — Patient Instructions (Signed)
 Medication Changes:  DECREASE METOPROLOL  SUCCINATE TO 25MG  ONCE DAILY   INCREASE SPIRONOLACTONE  TO 25MG  ONCE DAILY   DECREASE POTASSIUM TO ONCE DAILY (3) TABLETS   Lab Work:  PLEASE HAVE LABS DRAWN IN 1 WEEK AT [place]--ORDERS GIVEN   Testing/Procedures:  ECHOCARDIOGRAM--SCHEDULING WILL CALL TO ARRANGE   Follow-Up in: 4 MONTHS WITH DR. CHERRIE PLEASE CALL OUR OFFICE AROUND FEBRUARY TO GET SCHEDULED FOR YOUR APPOINTMENT. PHONE NUMBER IS [phone] OPTION 2   At the [facility], you and your health needs are our priority. We have a designated team specialized in the treatment of Heart Failure. This Care Team includes your primary Heart Failure Specialized Cardiologist (physician), Advanced Practice Providers (APPs- Physician Assistants and Nurse Practitioners), and Pharmacist who all work together to provide you with the care you need, when you need it.   You may see any of the following providers on your designated Care Team at your next follow up:  Dr. Toribio Cherrie Dr. Ezra Shuck Dr. Odis Brownie Greig Mosses, NP Caffie Shed, [place] [facility] [place], [place] Beckey Coe, NP Jordan Lee, NP Tinnie Redman, PharmD   Please be sure to bring in all your medications bottles to every appointment.   Need to Contact Us :  If you have any questions or concerns before your next appointment please send us  a message through [place] or call our office at [phone].    TO LEAVE A MESSAGE FOR THE NURSE SELECT OPTION 2, PLEASE LEAVE A MESSAGE INCLUDING: YOUR NAME DATE OF BIRTH CALL BACK NUMBER REASON FOR CALL**this is important as we prioritize the call backs  YOU WILL RECEIVE A CALL BACK THE SAME DAY AS LONG AS YOU CALL BEFORE 4:00 PM

## 2024-05-08 ENCOUNTER — Other Ambulatory Visit: Payer: Self-pay | Admitting: Family Medicine

## 2024-05-08 DIAGNOSIS — I4891 Unspecified atrial fibrillation: Secondary | ICD-10-CM

## 2024-05-10 LAB — BRAIN NATRIURETIC PEPTIDE: BNP: 10.2 pg/mL (ref 0.0–100.0)

## 2024-05-10 LAB — TSH: TSH: 11.6 u[IU]/mL — ABNORMAL HIGH (ref 0.450–4.500)

## 2024-05-10 LAB — T3: T3, Total: 109 ng/dL (ref 71–180)

## 2024-05-10 LAB — T4: T4, Total: 8.9 ug/dL (ref 4.5–12.0)

## 2024-05-17 ENCOUNTER — Ambulatory Visit: Admitting: Pulmonary Disease

## 2024-05-27 ENCOUNTER — Encounter: Attending: Gastroenterology

## 2024-05-27 DIAGNOSIS — I5022 Chronic systolic (congestive) heart failure: Secondary | ICD-10-CM | POA: Diagnosis not present

## 2024-05-27 NOTE — Progress Notes (Unsigned)
 " Cardiology Office Note:  .   Date:  05/27/2024  ID:  Krystal Silva, DOB 07/23/1955, MRN [id number] PCP: Krystal Jacqulyn MATSU, DO  [facility] Providers Cardiologist:  Krystal Fuel, MD {  History of Present Illness: .   Krystal Silva is a 69 y.o. female w/PMHx of  HTN, MS, HLD NICM, chronic CHF AFib  Referred to HF team 2025 Echo 10/03/23 EF 20-25% with global HK and moderate RV HK. Moderate MR [facility] 5/25 showed normal cors (vertically oriented path of prox LAD), well-compensated hemodynamics, EF 30-35%. cMRI 9/25 EF 44% RV EF 45% No LG   Referred to EP, saw Dr. Nancey, saw him 02/19/24, monitor w/9% burden, RVR 140's symptomatic, on amiodarone , discussed ablation > she wanted to give it some thought  She saw Dr. Fuel 05/02/24, back to work architect), doing ok.some SOB is better with using her PRN lasix  (once every few weeks) EF had improved some, though down again w/recurrent AF Encouraged her to reconsider ablation and f/u with EP  Today's visit is scheduled as a 3 mo visit ROS:   She has maintained normal rhythm since on amiodarone , and does feel well She is planning on retiring in March, health issues prompting this. She reports good medication compliance Noted her TSH lab abnormal No near syncope or syncope No bleeding, signs of bleeding She worries quite a lot about having a stroke    Arrhythmia/AAD hx AFib, June 2025 Amiodarone  started Sept 2025  Studies Reviewed: SABRA    EKG not done today 05/02/24: SB 52bpm  05/27/24: TTE 1. Left ventricular ejection fraction, by estimation, is 55 to 60%. The  left ventricle has normal function. The left ventricle demonstrates  regional wall motion abnormalities, entire anterior wall appears to be  mildly hypokinetic. Left ventricular  diastolic parameters are consistent with Grade I diastolic dysfunction  (impaired relaxation). The average left ventricular global longitudinal  strain is -18.6 %. The global  longitudinal strain is normal.   2. Right ventricular systolic function is normal. The right ventricular  size is normal. Tricuspid regurgitation signal is inadequate for assessing  PA pressure.   3. The mitral valve is normal in structure. No evidence of mitral valve  regurgitation. No evidence of mitral stenosis.   4. The aortic valve is tricuspid. Aortic valve regurgitation is not  visualized. No aortic stenosis is present.   5. The inferior vena cava is normal in size with greater than 50%  respiratory variability, suggesting right atrial pressure of 3 mmHg.   Comparison(s): A prior study was performed on 02/01/2024. Changes from  prior study are noted. LV function normalized (previously 25 to 30%).    TTE February 01, 2024 LVEF 25 to 30%.  Global hypokinesis.  Left atrium and right atrium are mildly dilated.  Trivial MR.     14 day monitor September 2025-- my interpretation In sinus rhythm, heart rate 41 to 108 bpm, average 59 bpm 9% burden of atrial fibrillation, heart rate 72 to 240 bpm, average heart rate 140 bpm.  Longest duration 31 hours.  Otherwise, rare ectopy.  There was a 13 beat of wide-complex tachycardia during rapid A-fib, possible aberrancy.  Cardiac MRI January 24, 2024 LVEF 44% with mild, diffuse hypokinesis.  RVEF 45%.  No LGE.  Normal exercise at a volume percentage.   Right/left heart cath May 2025 Normal coronaries.  Risk Assessment/Calculations:    Physical Exam:   VS:  There were no vitals taken for this visit.  Wt Readings from Last 3 Encounters:  05/02/24 170 lb 9.6 oz (77.4 kg)  04/04/24 168 lb 9.6 oz (76.5 kg)  02/19/24 164 lb 9.6 oz (74.7 kg)    GEN: Well nourished, well developed in no acute distress NECK: No JVD; No carotid bruits CARDIAC: RRR, no murmurs, rubs, gallops RESPIRATORY:   CTA b/l without rales, wheezing or rhonchi  ABDOMEN: Soft, non-tender, non-distended EXTREMITIES:  No edema; No deformity   ASSESSMENT AND PLAN: .     paroxysmal AFib CHA2DS2Vasc is 4, on eliquis , appropriately dosed Low/no burden by symptoms since amiodarone   Thyroid  panel recently with TSH 11.6 (normal T3, T4) We had a lengthy conversation today She is very weary about an ablation procedure, seems to her to be an extreme measure in treatment. Discussed current AFib management guidelines and ablation indications Discussed procedure, potential risks, benefits Discussed amiodarone , potential risks/side or off target effects, with no plans that this medication would be a long term management strategy for her given her young age. Plan to repeat thyroid  panel and get LFTs in ~ 3weeks  She appears to be a little more comfortable with the idea of ablation, though remains weary. She will retire in march and her insurance will change, worries about pursuing a procedure in the middle of these next few months, but will give it some thought  Should she decide to want to get scheduled, she will let us  know  NICM She has had recovery of her LVEF by echo on 05/28/23 ?WMA, normal coronaries, no symptoms > deferred to Dr. Cherrie  Secondary hypercoagulable state 2/2 AFib    Dispo: will have her back ~ April, after her retirement, sooner if needed.    Signed, [name], PA-C   "

## 2024-05-28 ENCOUNTER — Institutional Professional Consult (permissible substitution) (INDEPENDENT_AMBULATORY_CARE_PROVIDER_SITE_OTHER): Admitting: Otolaryngology

## 2024-05-28 LAB — ECHOCARDIOGRAM COMPLETE
AR max vel: 2.82 cm2
AV Peak grad: 5.4 mmHg
Ao pk vel: 1.16 m/s
Area-P 1/2: 3.17 cm2
Calc EF: 56.4 %
S' Lateral: 3.2 cm
Single Plane A2C EF: 59.3 %
Single Plane A4C EF: 53.1 %

## 2024-05-29 ENCOUNTER — Other Ambulatory Visit (HOSPITAL_COMMUNITY): Payer: Self-pay | Admitting: Family Medicine

## 2024-05-29 ENCOUNTER — Ambulatory Visit: Attending: Physician Assistant | Admitting: Physician Assistant

## 2024-05-29 VITALS — BP 114/72 | HR 62 | Ht 64.0 in | Wt 168.0 lb

## 2024-05-29 DIAGNOSIS — Z79899 Other long term (current) drug therapy: Secondary | ICD-10-CM | POA: Diagnosis not present

## 2024-05-29 DIAGNOSIS — I480 Paroxysmal atrial fibrillation: Secondary | ICD-10-CM

## 2024-05-29 DIAGNOSIS — I428 Other cardiomyopathies: Secondary | ICD-10-CM

## 2024-05-29 DIAGNOSIS — D6869 Other thrombophilia: Secondary | ICD-10-CM | POA: Diagnosis not present

## 2024-05-29 DIAGNOSIS — Z5181 Encounter for therapeutic drug level monitoring: Secondary | ICD-10-CM

## 2024-05-29 NOTE — Patient Instructions (Addendum)
 Medication Instructions:   Your physician recommends that you continue on your current medications as directed. Please refer to the Current Medication list given to you today.  *If you need a refill on your cardiac medications before your next appointment, please call your pharmacy*  Lab Work:  PLEASE GO DOWN STAIRS  [facility]  FIRST FLOOR   ( GET OFF ELEVATORS WALK TOWARDS WAITING AREA LAB LOCATED BY PHARMACY):  IN N 3 WEEKS  LFT AND THYROID  PANEL     If you have labs (blood work) drawn today and your tests are completely normal, you will receive your results only by: MyChart Message (if you have MyChart) OR A paper copy in the mail If you have any lab test that is abnormal or we need to change your treatment, we will call you to review the results.   Testing/Procedures NONE ORDERED  TODAY    Follow-Up: At [facility], you and your health needs are our priority.  As part of our continuing mission to provide you with exceptional heart care, our providers are all part of one team.  This team includes your primary Cardiologist (physician) and Advanced Practice Providers or APPs (Physician Assistants and Nurse Practitioners) who all work together to provide you with the care you need, when you need it.  Your next appointment:  4 month(s)   Provider:   Eulas Furbish, MD or Charlies Arthur, PA-C  ( CONTACT  CASSIE HALL/ ANGELINE HAMMER FOR EP SCHEDULING ISSUES )   We recommend signing up for the patient portal called MyChart.  Sign up information is provided on this After Visit Summary.  MyChart is used to connect with patients for Virtual Visits (Telemedicine).  Patients are able to view lab/test results, encounter notes, upcoming appointments, etc.  Non-urgent messages can be sent to your provider as well.   To learn more about what you can do with MyChart, go to [email or web address].   Other Instructions

## 2024-05-30 ENCOUNTER — Other Ambulatory Visit (HOSPITAL_COMMUNITY): Payer: Self-pay | Admitting: Physician Assistant

## 2024-06-03 ENCOUNTER — Ambulatory Visit (HOSPITAL_COMMUNITY): Payer: Self-pay | Admitting: Internal Medicine

## 2024-06-10 ENCOUNTER — Other Ambulatory Visit (HOSPITAL_COMMUNITY): Payer: Self-pay | Admitting: Family Medicine

## 2024-06-19 ENCOUNTER — Institutional Professional Consult (permissible substitution) (INDEPENDENT_AMBULATORY_CARE_PROVIDER_SITE_OTHER): Admitting: Otolaryngology

## 2024-06-20 ENCOUNTER — Ambulatory Visit: Payer: Self-pay | Admitting: Physician Assistant

## 2024-06-20 LAB — THYROID PANEL WITH TSH
Free Thyroxine Index: 2.4 (ref 1.2–4.9)
T3 Uptake Ratio: 28 % (ref 24–39)
T4, Total: 8.6 ug/dL (ref 4.5–12.0)
TSH: 13.4 u[IU]/mL — ABNORMAL HIGH (ref 0.450–4.500)

## 2024-06-20 LAB — HEPATIC FUNCTION PANEL
ALT: 24 [IU]/L (ref 0–32)
AST: 16 [IU]/L (ref 0–40)
Albumin: 4.8 g/dL (ref 3.9–4.9)
Alkaline Phosphatase: 113 [IU]/L (ref 49–135)
Bilirubin Total: 0.7 mg/dL (ref 0.0–1.2)
Bilirubin, Direct: 0.23 mg/dL (ref 0.00–0.40)
Total Protein: 7.6 g/dL (ref 6.0–8.5)

## 2024-06-28 ENCOUNTER — Other Ambulatory Visit: Payer: Self-pay | Admitting: Neurology

## 2024-06-28 NOTE — Telephone Encounter (Signed)
 Lvm to call clinic  for results

## 2024-06-28 NOTE — Telephone Encounter (Signed)
-----   Message from [name] sent at 06/27/2024  9:44 AM EST ----- Please follow up

## 2024-07-16 ENCOUNTER — Institutional Professional Consult (permissible substitution) (INDEPENDENT_AMBULATORY_CARE_PROVIDER_SITE_OTHER): Admitting: Otolaryngology

## 2024-09-27 ENCOUNTER — Ambulatory Visit: Admitting: Physician Assistant

## 2025-01-07 ENCOUNTER — Ambulatory Visit: Admitting: Neurology
